# Patient Record
Sex: Male | Born: 1970 | Hispanic: No | Marital: Single | Smoking: Never smoker
Health system: Southern US, Community
[De-identification: ages and names within clinical notes are randomized; demographics above are authoritative.]

## PROBLEM LIST (undated history)

## (undated) DIAGNOSIS — D62 Acute posthemorrhagic anemia: Secondary | ICD-10-CM

## (undated) DIAGNOSIS — K760 Fatty (change of) liver, not elsewhere classified: Secondary | ICD-10-CM

## (undated) DIAGNOSIS — H40053 Ocular hypertension, bilateral: Secondary | ICD-10-CM

## (undated) DIAGNOSIS — K269 Duodenal ulcer, unspecified as acute or chronic, without hemorrhage or perforation: Secondary | ICD-10-CM

## (undated) DIAGNOSIS — E119 Type 2 diabetes mellitus without complications: Secondary | ICD-10-CM

## (undated) DIAGNOSIS — R569 Unspecified convulsions: Secondary | ICD-10-CM

## (undated) DIAGNOSIS — L409 Psoriasis, unspecified: Secondary | ICD-10-CM

## (undated) DIAGNOSIS — K279 Peptic ulcer, site unspecified, unspecified as acute or chronic, without hemorrhage or perforation: Secondary | ICD-10-CM

## (undated) DIAGNOSIS — H25013 Cortical age-related cataract, bilateral: Secondary | ICD-10-CM

## (undated) DIAGNOSIS — K7460 Unspecified cirrhosis of liver: Secondary | ICD-10-CM

## (undated) HISTORY — DX: Fatty (change of) liver, not elsewhere classified: K76.0

## (undated) HISTORY — DX: Cortical age-related cataract, bilateral: H25.013

## (undated) HISTORY — DX: Duodenal ulcer, unspecified as acute or chronic, without hemorrhage or perforation: K26.9

## (undated) HISTORY — DX: Ocular hypertension, bilateral: H40.053

## (undated) HISTORY — DX: Type 2 diabetes mellitus without complications: E11.9

## (undated) HISTORY — DX: Peptic ulcer, site unspecified, unspecified as acute or chronic, without hemorrhage or perforation: K27.9

---
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
## 1998-10-28 ENCOUNTER — Emergency Department (HOSPITAL_COMMUNITY): Admission: EM | Admit: 1998-10-28 | Discharge: 1998-10-28 | Payer: Self-pay | Admitting: Emergency Medicine

## 1998-10-28 ENCOUNTER — Encounter: Payer: Self-pay | Admitting: Emergency Medicine

## 2000-01-24 ENCOUNTER — Emergency Department (HOSPITAL_COMMUNITY): Admission: EM | Admit: 2000-01-24 | Discharge: 2000-01-24 | Payer: Self-pay | Admitting: Emergency Medicine

## 2005-05-05 ENCOUNTER — Observation Stay (HOSPITAL_COMMUNITY): Admission: EM | Admit: 2005-05-05 | Discharge: 2005-05-06 | Payer: Self-pay | Admitting: *Deleted

## 2005-05-10 ENCOUNTER — Ambulatory Visit (HOSPITAL_COMMUNITY): Admission: RE | Admit: 2005-05-10 | Discharge: 2005-05-10 | Payer: Self-pay | Admitting: Pediatrics

## 2005-05-15 ENCOUNTER — Ambulatory Visit: Payer: Self-pay | Admitting: Nurse Practitioner

## 2005-05-17 ENCOUNTER — Ambulatory Visit: Payer: Self-pay | Admitting: *Deleted

## 2005-08-22 ENCOUNTER — Ambulatory Visit: Payer: Self-pay | Admitting: Nurse Practitioner

## 2005-09-06 ENCOUNTER — Ambulatory Visit (HOSPITAL_COMMUNITY): Admission: RE | Admit: 2005-09-06 | Discharge: 2005-09-06 | Payer: Self-pay | Admitting: Internal Medicine

## 2005-09-06 ENCOUNTER — Encounter (INDEPENDENT_AMBULATORY_CARE_PROVIDER_SITE_OTHER): Payer: Self-pay | Admitting: *Deleted

## 2005-10-26 ENCOUNTER — Emergency Department (HOSPITAL_COMMUNITY): Admission: EM | Admit: 2005-10-26 | Discharge: 2005-10-26 | Payer: Self-pay | Admitting: Emergency Medicine

## 2007-04-06 ENCOUNTER — Inpatient Hospital Stay (HOSPITAL_COMMUNITY): Admission: AD | Admit: 2007-04-06 | Discharge: 2007-04-07 | Payer: Self-pay | Admitting: Neurology

## 2007-04-06 ENCOUNTER — Encounter: Payer: Self-pay | Admitting: Emergency Medicine

## 2007-05-19 ENCOUNTER — Emergency Department (HOSPITAL_COMMUNITY): Admission: EM | Admit: 2007-05-19 | Discharge: 2007-05-19 | Payer: Self-pay | Admitting: Emergency Medicine

## 2007-05-20 ENCOUNTER — Encounter (INDEPENDENT_AMBULATORY_CARE_PROVIDER_SITE_OTHER): Payer: Self-pay | Admitting: Nurse Practitioner

## 2007-05-20 ENCOUNTER — Ambulatory Visit: Payer: Self-pay | Admitting: Internal Medicine

## 2007-05-20 LAB — CONVERTED CEMR LAB: Carbamazepine Lvl: 5.2 ug/mL (ref 4.0–12.0)

## 2007-06-13 ENCOUNTER — Ambulatory Visit: Payer: Self-pay | Admitting: Family Medicine

## 2007-07-15 ENCOUNTER — Encounter (INDEPENDENT_AMBULATORY_CARE_PROVIDER_SITE_OTHER): Payer: Self-pay | Admitting: Nurse Practitioner

## 2007-07-15 ENCOUNTER — Ambulatory Visit: Payer: Self-pay | Admitting: Internal Medicine

## 2007-07-15 LAB — CONVERTED CEMR LAB
ALT: 20 units/L (ref 0–53)
AST: 20 units/L (ref 0–37)
Alkaline Phosphatase: 85 units/L (ref 39–117)
CO2: 25 meq/L (ref 19–32)
Calcium: 9.5 mg/dL (ref 8.4–10.5)
Chloride: 103 meq/L (ref 96–112)
Creatinine, Ser: 0.81 mg/dL (ref 0.40–1.50)
Potassium: 3.9 meq/L (ref 3.5–5.3)
Sodium: 140 meq/L (ref 135–145)
Total Bilirubin: 0.4 mg/dL (ref 0.3–1.2)
Total Protein: 7.7 g/dL (ref 6.0–8.3)

## 2007-07-21 ENCOUNTER — Ambulatory Visit (HOSPITAL_COMMUNITY): Admission: RE | Admit: 2007-07-21 | Discharge: 2007-07-21 | Payer: Self-pay | Admitting: Family Medicine

## 2007-09-08 ENCOUNTER — Observation Stay (HOSPITAL_COMMUNITY): Admission: EM | Admit: 2007-09-08 | Discharge: 2007-09-09 | Payer: Self-pay | Admitting: Emergency Medicine

## 2007-10-13 ENCOUNTER — Encounter (INDEPENDENT_AMBULATORY_CARE_PROVIDER_SITE_OTHER): Payer: Self-pay | Admitting: Family Medicine

## 2007-10-13 ENCOUNTER — Ambulatory Visit: Payer: Self-pay | Admitting: Internal Medicine

## 2007-10-13 LAB — CONVERTED CEMR LAB
Amphetamine Screen, Ur: NEGATIVE
Basophils Relative: 1 % (ref 0–1)
Benzodiazepines.: NEGATIVE
Creatinine,U: 191.4 mg/dL
Eosinophils Absolute: 0.4 10*3/uL (ref 0.0–0.7)
HCT: 45.5 % (ref 39.0–52.0)
MCHC: 33.2 g/dL (ref 30.0–36.0)
MCV: 83.9 fL (ref 78.0–100.0)
Marijuana Metabolite: POSITIVE — AB
Neutro Abs: 3.2 10*3/uL (ref 1.7–7.7)
Neutrophils Relative %: 57 % (ref 43–77)
Opiate Screen, Urine: NEGATIVE
Phencyclidine (PCP): NEGATIVE
Propoxyphene: NEGATIVE
RDW: 12.9 % (ref 11.5–15.5)
WBC: 5.5 10*3/uL (ref 4.0–10.5)

## 2007-10-16 ENCOUNTER — Encounter (INDEPENDENT_AMBULATORY_CARE_PROVIDER_SITE_OTHER): Payer: Self-pay | Admitting: Family Medicine

## 2007-11-03 ENCOUNTER — Encounter (INDEPENDENT_AMBULATORY_CARE_PROVIDER_SITE_OTHER): Payer: Self-pay | Admitting: Family Medicine

## 2007-11-03 ENCOUNTER — Ambulatory Visit: Payer: Self-pay | Admitting: Internal Medicine

## 2008-02-18 ENCOUNTER — Ambulatory Visit: Payer: Self-pay | Admitting: Internal Medicine

## 2008-03-24 ENCOUNTER — Ambulatory Visit: Payer: Self-pay | Admitting: Internal Medicine

## 2008-05-17 ENCOUNTER — Ambulatory Visit: Payer: Self-pay | Admitting: Internal Medicine

## 2008-06-02 ENCOUNTER — Ambulatory Visit: Payer: Self-pay | Admitting: Cardiology

## 2008-06-02 ENCOUNTER — Ambulatory Visit: Admission: RE | Admit: 2008-06-02 | Discharge: 2008-06-02 | Payer: Self-pay | Admitting: Internal Medicine

## 2008-06-02 ENCOUNTER — Encounter: Payer: Self-pay | Admitting: Internal Medicine

## 2009-03-29 ENCOUNTER — Ambulatory Visit: Payer: Self-pay | Admitting: Internal Medicine
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
## 2010-08-22 NOTE — Procedures (Signed)
EEG NUMBER:  [id number].   CLINICAL HISTORY:  This is a 40 year old male being evaluated for  seizures.   MEDICATION LISTED:  Tegretol, normal saline, Tylenol, Ativan, and  Flomax.   There is a portable EEG recorded with the patient awake using 17-channel  machine and standard 10/20 electrode placement.   The background awake rhythm consists of 10-11 Hz alpha which is of  moderate amplitude synchronous reactive to eye-opening and closure.  No  paroxysmal epileptiform activity or spikes are seen.  Intermittent left  frontal and central sharp waves seen, but they did not acquire  epileptiform features.  Stages of drowsiness and mild sleep are achieved  in this tracing and show normal physiological findings.  Length of the  recording is 20.6 minutes.  Technical component is average.  EKG tracing  reveals regular sinus rhythm.  Hyperventilation and photic stimulation  not performed, this being a portable study.   IMPRESSION:  This EEG performed during awake states and light sleep is  slightly abnormal due to presence of focal left hemispheric cortical  irritability, but no definite epileptiform features were noted.   INDICATIONS:  Thank you           ______________________________  Kathie Rhodes. Leonie Man, MD     [id number]  D:  04/07/2007 14:10:39  T:  04/07/2007 16:20:05  Job #:  [id number]
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
## 2010-08-25 NOTE — H&P (Signed)
[name], [name]                 ACCOUNT NO.:  [id number]   MEDICAL RECORD NO.:  [id number]          PATIENT TYPE:  INP   LOCATION:  3006                         FACILITY:  [facility]   PHYSICIAN:  Princess Bruins. Hickling, M.D.DATE OF BIRTH:  1970-12-23   DATE OF ADMISSION:  05/05/2005  DATE OF DISCHARGE:                                HISTORY & PHYSICAL   CHIEF COMPLAINT:  Left focal seizures, headache, lumbar puncture, with  elevated red blood cells.   HISTORY OF PRESENT ILLNESS:  A 40 year old right-handed [place] male who  has immigrated to this country from [place].  He awakened at 6:15 in the  morning and was normal.  Around 10:15, he awakened and felt dizzy, unsteady  on his feet, possibly vertigo.  His sister observed rotatory nystagmus in  his eyes.  He came out of his room stumbling.  She had him kneel down.  He  complained of a frontal headache.  He then had onset of left focal motor  seizures.  He thinks that the seizures began when he was in his room.  She  did not see the seizures until after he had come into the kitchen where she  was.  He fell over and struck his head, although did not strike it hard from  a squatting position.  She kept her hand underneath his head, but he  continued to strike his head on the floor, although not very hard.  She only  saw jerking of the left side.  She does not remember the right side jerking.  He had saliva, he was unresponsive, the entire episode lasted for about five  minutes.  He had postictal stupor for five minutes, and confusion for a much  longer period of time.  By the time he arrived to [facility], he  complained of a headache, as it was frontal, 10/10.  He did not have a stiff  neck.  On the left, Dr. Kyra Searles who saw him, was concerned about a  subarachnoid hemorrhage and had a lumbar puncture carried out under  fluoroscopy.  In speaking with Peterson Ao, the needle went to the  perivenous plexus posteriorly and  there was initial blood which cleared with  time, although it dripped slowly.  Red blood cells 11,320 in the first tube,  2590 in the fourth.  Though I suspect that this is just a traumatic tap and  postictal migraine, we need to perform a catheter angiogram to rule out a  berry aneurysm.  CT scan of the brain was reviewed by me personally and is  normal.   PAST MEDICAL HISTORY:  The patient had simple febrile seizures up to age 35.  He has not had a closed head injury, no hospitalizations.   PAST SURGICAL HISTORY:  None.   REVIEW OF SYSTEMS:  The patient has had no intercurrent infections of the  head, neck, lungs, GI, GU, rash, anemia, bruisability, diabetes, or thyroid  disease.  He does have psoriasis.  A 12-system review was otherwise  negative.   ALLERGIES:  1.  PENICILLIN.  2.  SULFA.   MEDICATIONS:  None.   FAMILY HISTORY:  No seizures, no mental retardation, cerebral palsy,  blindness, deafness, or other neurologic disorders.   SOCIAL HISTORY:  The patient lives with his sisters and brother-in-law.  He  quit smoking three weeks ago.  He denies use of alcohol and drugs.   PHYSICAL EXAMINATION:  VITAL SIGNS:  Temperature 97, blood pressure 131/70,  resting pulse 82, respirations 16, oxygen saturation 98%.  GENERAL:  He has a dull frontal headache.  HEENT:  No signs of infection.  Tympanic membranes were shiny without  infection.  No Battle's sign.  He has allergic shiners.  NECK:  Supple.  LUNGS:  Clear.  HEART:  No murmurs.  Pulses normal.  ABDOMEN:  Soft, scaphoid, bowel sounds normal, no hepatosplenomegaly.  EXTREMITIES:  Negative.  He is quite thin.  NEUROLOGIC:  Mental status:  Awake, alert, sleepy, no dysphasia, or  dyspraxia.  Cranial nerves:  Round reactive pupils.  Visual fields full.  Extraocular movements full.  Symmetric facial strength.  Midline tongue and  uvula.  Air conduction greater than bone conduction bilaterally.  Motor  examination:  Normal  strength, tone, and mass.  Good fine motor movements.  No pronator drift.  Sensation intact to primary and cortical modalities.  Cerebellar examination:  Good finger-to-nose, rapid repetitive movements.  Gait normal.  Deep tendon reflexes diminished.  Toes bilaterally flexor.   IMPRESSION:  1.  Left focal seizures, single seizure, not definitely epilepsy, 780.39.      He has half the semiology of a complex partial seizure, right brain      signature.  2.  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  I doubt that he had a      subarachnoid hemorrhage.   PLAN:  Catheter angiogram.  If it is positive for a berry aneurysm I will  consult neurosurgery and ask them to admit.  If negative, I will admit the  patient for observation for seizures.  We are not going to treat with  antiepileptic drugs for now.  MRI of the brain with and without contrast  will be done in the morning.  EEG done next week.  I will probably discharge  him tomorrow after the MRI if he is stable.  This has been discussed with  the patient's family.  Dr. Lawrence Santiago has agreed to perform the catheter  angiogram, agrees with the rationale.   LABORATORY DATA:  I reviewed the laboratory, it is as follows:  Sodium 135,  potassium 4.8, chloride 107, BUN 15, glucose 135.  Venous pH 7.317,  bicarbonate 21.5.  Hemoglobin 17, hematocrit 50.  Creatinine 0.7.  White  blood cell count 6500, hemoglobin 15.2, hematocrit 45.2, MCV 82.2, platelet  count 213,000.  Neutrophils 58, lymphocytes 28, monocytes 5, eosinophils 90,  basophils 1.  Alcohol level not detectable.  Glucose 69, protein 51, red  blood cells 11,320, white blood cells 13, neutrophils 68%, lymphocytes 25%,  7% monomacrophages, very similar to the peripheral smear differential.  Tube  #4, 2590 red blood cells, 4 white blood cells.  Toxic screen:  Opiates  positive, cocaine negative, benzodiazepines negative, barbiturates negative, amphetamines negative, THC positive.   Prothrombin time 13.5.  I do not see a  PTT recorded.  The patient received morphine at 1300 when he came in which  accounts for his opiates.  Urine tox screen was done at 1530.  The patient  received Ativan at 1544 for his lumbar puncture.  Princess Bruins. Gaynell Face, M.D.  Electronically Signed     [facility]  D:  05/05/2005  T:  05/06/2005  Job:  [id number]

## 2010-08-25 NOTE — Discharge Summary (Signed)
[name], [name]                 ACCOUNT NO.:  [id number]   MEDICAL RECORD NO.:  [id number]          PATIENT TYPE:  INP   LOCATION:  3022                         FACILITY:  [facility]   PHYSICIAN:  Flint Melter. Jacolyn Reedy, M.D.DATE OF BIRTH:  22-Jun-1970   DATE OF ADMISSION:  04/06/2007  DATE OF DISCHARGE:  04/07/2007                               DISCHARGE SUMMARY   ADMITTING DIAGNOSIS:  Seizure.   DISCHARGE DIAGNOSIS:  Seizure.   MEDICATIONS ON DISCHARGE:  1. Tegretol 200 mg t.i.d.  2. Allegra 180 mg daily.   FOLLOWUP:  Follow up with Dr. Gaynell Face, [facility].  Call for  appointment in six to eight weeks.  Will need Tegretol level in two  weeks.  No restrictions on diet.  Wound care is not applicable.   ACTIVITY:  No driving for three months until followup with Dr. Gaynell Face.   STUDIES:  MRI of the brain done on April 07, 2007, was negative for  any acute intracranial findings.  The parenchyma was normal.  No  abnormal enhancement was seen.   HOSPITAL COURSE:  Please refer to the history and physical for details.  This is a 40 year old [place] male with no significant past medical  history until January 2007 when he was admitted with a single seizure  with right brain signature, left focal motor with complex semiology.  He  had a traumatic LP, but a negative angiogram.  EEG showed occasional  left frontotemporal sharp transients.  MRI of the brain at that time was  normal.  He was discharged without any clear diagnosis or etiology.  He  had a second seizure in July 2007, but apparently did not follow up with  the neurologist.  On the day of this admission on April 06, 2007, he  was found on the floor by his mother and sister after several events  which were not otherwise described.  The family called EMS, and the  seizures were witnessed in the emergency room, but he was described as  alternately combative and sleepy postictally.  He was eventually given 4  mg of  Ativan and 20 mg of Geodon.  As he did not clear within a few  hours, he was transferred from [place] to [facility] stepdown for  further observation and workup.  An EEG was performed and showed a few  left central parietal sharps but no definite seizures.  The morning of  discharge, he was awake and alert, fully oriented and conversant, moving  all extremities, and had a nonfocal exam.  It was felt that he had  complex partial seizures.  He was started on Tegretol and discharged in  improved condition as outlined above, to follow up with Dr. Gaynell Face.      Catherine A. Jacolyn Reedy, M.D.  Electronically Signed     CAW/MEDQ  D:  06/25/2007  T:  06/25/2007  Job:  [id number]
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
## 2010-08-25 NOTE — Discharge Summary (Signed)
[name], [name]                 ACCOUNT NO.:  [id number]   MEDICAL RECORD NO.:  [id number]          PATIENT TYPE:  OBV   LOCATION:  3308                         FACILITY:  [facility]   PHYSICIAN:  Princess Bruins. 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   PROCEDURES:  1. CT scan of the brain.  2. EEG.   COMPLICATIONS:  None.   SUMMARY OF THE HOSPITALIZATION:  The patient was admitted in a postictal  state.  He pulled out several IVs placed and refused acute treatment.  He was placed under observation in the step-down unit because of his  unstable condition.  He fortunately had no further seizures, though he  had had a series of at least three seizures in a 24-hour period treated  with Ativan.  His carbamazepine level was 2.9 mcg/mL.  His dose of  medicine he took at home was Tegretol but has been husbanding his  medication and had actually missed a dose or two because he was unable  to obtain medication from [facility] without an appointment.   The patient improved to normal level of alertness with no further  seizures.  His carbamazepine dose was increased to 200 mg in the morning  and afternoon, and 400 mg at nighttime.  He was discharged home with  recommendations to return for follow-up at [facility] and also to be seen at [facility] for ongoing  follow-up of his medical care.  I wrote a prescription for 3 days with  medication to give him time to contact physicians at [facility].      Princess Bruins. Gaynell Face, M.D.  Electronically Signed     [facility]  D:  09/19/2007  T:  09/19/2007  Job:  [id number]   cc:   Monia Sabal. Jobe Igo, M.D.

## 2010-08-25 NOTE — Procedures (Signed)
EEG NUMBER:  [id number]   ORDERED BY:  Dr. Gaynell Face   This is a routine 17-channel EEG utilizing the [employer] 10-20 lead  placement system. The patient is described as being awake and alert through  the recording. This is a 40 year old man with a history of possible seizure  and a history of childhood seizures.   The patient does appear to be in the waking state throughout the course of  the recording. The background consists of a well-organized, well-modulated  10 Hz alpha activity which is predominant in the posterior head regions. No  clear interhemispheric asymmetry is identified with the exception of  occasional transient sharp waves in the left frontotemporal region without  clear-cut generalization or evolution into a seizure discharge. Activation  procedures were performed including photic stimulation and hyperventilation.  These did not produce any significant change in the background activity. The  EKG reveals a relatively regular rhythm with a low rate in the 50s and 60s.   CONCLUSION:  Essentially normal EEG with the exception of occasional sharp  transients in the left frontotemporal region. Although no definite seizure  discharges are seen this could indicate some focal irritability in this  region which might serve to lower the seizure threshold. Clinical  correlation is recommended. Sleep-deprived EEG may be helpful if indicated.      Catherine A. Jacolyn Reedy, M.D.  Electronically Signed     [id number]  D:  05/10/2005 14:23:43  T:  05/10/2005 16:24:42  Job #:  [id number]

## 2011-01-04 LAB — COMPREHENSIVE METABOLIC PANEL
AST: 26
BUN: 7
CO2: 30
Glucose, Bld: 166 — ABNORMAL HIGH
Potassium: 3.8
Sodium: 139
Total Protein: 6.6

## 2011-01-04 LAB — CBC
HCT: 44.2
Hemoglobin: 14.7
MCHC: 33.2
RBC: 5.17
RDW: 12.3
WBC: 7

## 2011-01-04 LAB — URINALYSIS, ROUTINE W REFLEX MICROSCOPIC
Bilirubin Urine: NEGATIVE
Glucose, UA: 250 — AB
Ketones, ur: 15 — AB
Protein, ur: 100 — AB
Specific Gravity, Urine: 1.017
Urobilinogen, UA: 0.2
pH: 7.5

## 2011-01-04 LAB — DIFFERENTIAL
Eosinophils Absolute: 0.6
Eosinophils Relative: 9 — ABNORMAL HIGH
Lymphocytes Relative: 25
Monocytes Absolute: 0.3
Monocytes Relative: 4
Neutro Abs: 4.3

## 2011-01-04 LAB — CARBAMAZEPINE LEVEL, TOTAL: Carbamazepine Lvl: 2.7 — ABNORMAL LOW

## 2011-01-12 LAB — CBC
HCT: 42.7
Hemoglobin: 14.3
MCV: 82.7
Platelets: 242
RBC: 5.17
WBC: 8.6

## 2011-01-12 LAB — COMPREHENSIVE METABOLIC PANEL
AST: 37
Albumin: 3.8
Calcium: 9.2
Chloride: 106
Creatinine, Ser: 0.93
Glucose, Bld: 169 — ABNORMAL HIGH
Total Bilirubin: 0.6
Total Protein: 6.9

## 2011-01-12 LAB — ETHANOL: Alcohol, Ethyl (B): <5

## 2011-01-12 LAB — DIFFERENTIAL
Basophils Absolute: 0
Basophils Relative: 0
Lymphocytes Relative: 27
Monocytes Absolute: 0.6
Monocytes Relative: 7

## 2011-01-12 LAB — RAPID URINE DRUG SCREEN, HOSP PERFORMED
Benzodiazepines: NOT DETECTED
Cocaine: NOT DETECTED
Opiates: NOT DETECTED

## 2011-01-12 LAB — URINALYSIS, ROUTINE W REFLEX MICROSCOPIC
Bilirubin Urine: NEGATIVE
Ketones, ur: NEGATIVE
Leukocytes, UA: NEGATIVE
Nitrite: NEGATIVE
Protein, ur: 30 — AB
pH: 6.5

## 2015-12-11 ENCOUNTER — Emergency Department (HOSPITAL_COMMUNITY)
Admission: EM | Admit: 2015-12-11 | Discharge: 2015-12-11 | Disposition: A | Discharge status: Home / Self Care | Payer: Self-pay | Attending: Emergency Medicine | Admitting: Emergency Medicine

## 2015-12-11 ENCOUNTER — Encounter (HOSPITAL_COMMUNITY): Payer: Self-pay | Admitting: Emergency Medicine

## 2015-12-11 DIAGNOSIS — K089 Disorder of teeth and supporting structures, unspecified: Secondary | ICD-10-CM | POA: Insufficient documentation

## 2015-12-11 DIAGNOSIS — R112 Nausea with vomiting, unspecified: Secondary | ICD-10-CM | POA: Insufficient documentation

## 2015-12-11 DIAGNOSIS — L989 Disorder of the skin and subcutaneous tissue, unspecified: Secondary | ICD-10-CM | POA: Insufficient documentation

## 2015-12-11 DIAGNOSIS — Z79899 Other long term (current) drug therapy: Secondary | ICD-10-CM | POA: Insufficient documentation

## 2015-12-11 DIAGNOSIS — K0889 Other specified disorders of teeth and supporting structures: Secondary | ICD-10-CM | POA: Insufficient documentation

## 2015-12-11 HISTORY — DX: Unspecified cirrhosis of liver: K74.60

## 2015-12-11 HISTORY — DX: Unspecified convulsions: R56.9

## 2015-12-11 LAB — CBC WITH DIFFERENTIAL/PLATELET
Basophils Absolute: 0 10*3/uL (ref 0.0–0.1)
Basophils Relative: 0 %
Eosinophils Absolute: 0.3 10*3/uL (ref 0.0–0.7)
Eosinophils Relative: 3 %
HCT: 46.1 % (ref 39.0–52.0)
Hemoglobin: 15.5 g/dL (ref 13.0–17.0)
Lymphocytes Relative: 25 %
Lymphs Abs: 2.3 10*3/uL (ref 0.7–4.0)
MCH: 26.5 pg (ref 26.0–34.0)
MCHC: 33.6 g/dL (ref 30.0–36.0)
MCV: 78.9 fL (ref 78.0–100.0)
Monocytes Absolute: 0.4 10*3/uL (ref 0.1–1.0)
Monocytes Relative: 5 %
Neutro Abs: 6.3 10*3/uL (ref 1.7–7.7)
Neutrophils Relative %: 67 %
Platelets: 248 10*3/uL (ref 150–400)
RBC: 5.84 MIL/uL — ABNORMAL HIGH (ref 4.22–5.81)
RDW: 13.4 % (ref 11.5–15.5)
WBC: 9.4 10*3/uL (ref 4.0–10.5)

## 2015-12-11 LAB — COMPREHENSIVE METABOLIC PANEL
ALT: 16 U/L — ABNORMAL LOW (ref 17–63)
AST: 24 U/L (ref 15–41)
Albumin: 4.7 g/dL (ref 3.5–5.0)
Alkaline Phosphatase: 64 U/L (ref 38–126)
Anion gap: 13 (ref 5–15)
BUN: <5 mg/dL — ABNORMAL LOW (ref 6–20)
CO2: 21 mmol/L — ABNORMAL LOW (ref 22–32)
Calcium: 9.5 mg/dL (ref 8.9–10.3)
Chloride: 101 mmol/L (ref 101–111)
Creatinine, Ser: 0.89 mg/dL (ref 0.61–1.24)
GFR calc Af Amer: >60 mL/min (ref >60)
GFR calc non Af Amer: >60 mL/min (ref >60)
Glucose, Bld: 109 mg/dL — ABNORMAL HIGH (ref 65–99)
Potassium: 3.5 mmol/L (ref 3.5–5.1)
Sodium: 135 mmol/L (ref 135–145)
Total Bilirubin: 0.3 mg/dL (ref 0.3–1.2)
Total Protein: 8.3 g/dL — ABNORMAL HIGH (ref 6.5–8.1)

## 2015-12-11 LAB — LIPASE, BLOOD: Lipase: 22 U/L (ref 11–51)

## 2015-12-11 MED ORDER — CLINDAMYCIN HCL 150 MG PO CAPS: 450.0000 mg | ORAL_CAPSULE | Freq: Three times a day (TID) | ORAL | 0 refills | Status: AC

## 2015-12-11 MED ORDER — SODIUM CHLORIDE 0.9 % IV BOLUS (SEPSIS)
1000.0000 mL | Freq: Once | INTRAVENOUS | Status: AC
  Administered 2015-12-11: 1000 mL via INTRAVENOUS

## 2015-12-11 MED ORDER — ONDANSETRON HCL 4 MG/2ML IJ SOLN
4.0000 mg | Freq: Once | INTRAMUSCULAR | Status: AC
  Administered 2015-12-11: 4 mg via INTRAVENOUS
  Filled 2015-12-11: qty 2

## 2015-12-11 MED ORDER — NAPROXEN 375 MG PO TABS: 375.0000 mg | ORAL_TABLET | Freq: Two times a day (BID) | ORAL | 0 refills | Status: DC

## 2015-12-11 MED ORDER — ONDANSETRON HCL 4 MG PO TABS: 4.0000 mg | ORAL_TABLET | Freq: Three times a day (TID) | ORAL | 0 refills | Status: DC | PRN

## 2015-12-11 NOTE — ED Provider Notes (Signed)
[facility] DEPT Provider Note   CSN: [id number] Arrival date & time: 12/11/15  [id number]     History   Chief Complaint Chief Complaint  Patient presents with  . Dizziness    HPI [name] is a 45 y.o. male with history of epilepsy and psoriasis who presents with a four-day history of nausea and vomiting. Patient also reports intermittent nausea and feeling like he is going to "hit the floor" when he smells certain smells for the past year and a half. He also reports associated generalized body aches. Patient states he has had a decreased appetite "for a long time"and only eats every 3-4 days. Patient states that ever since his diagnosis of epilepsy, "my brain doesn't work right." Patient states that he sees black spots with headlights on the throat and gets a pounding headache. Patient also reports multiple skin lesions following getting any type of nick or bite. He has tried psoriasis medication. Patient also reports dental pain that has been chronic. Patient reports that he needs to have his teeth pulled. Patient has also taken ibuprofen for his symptoms. Patient has had 3-4 seizures in his life, last seizure being 3 years ago. Patient denies vertigo, chest pain, shortness of breath, abdominal pain, current nausea, urinary symptoms.  HPI  Past Medical History:  Diagnosis Date  . Cirrhosis ([place])   . Seizures ([place])     There are no active problems to display for this patient.   History reviewed. No pertinent surgical history.     Home Medications    Prior to Admission medications   Medication Sig Start Date End Date Taking? Authorizing Provider  fexofenadine (ALLEGRA) 180 MG tablet Take 180 mg by mouth daily as needed for allergies or rhinitis.   Yes Historical Provider, MD  ibuprofen (ADVIL,MOTRIN) 200 MG tablet Take 600-800 mg by mouth every 6 (six) hours as needed for moderate pain.   Yes Historical Provider, MD  clindamycin (CLEOCIN) 150 MG capsule Take 3 capsules (450 mg  total) by mouth 3 (three) times daily. 12/11/15 12/18/15  Frederica Kuster, PA-C  naproxen (NAPROSYN) 375 MG tablet Take 1 tablet (375 mg total) by mouth 2 (two) times daily with a meal. 12/11/15   Bea Graff Kyran Connaughton, PA-C  ondansetron (ZOFRAN) 4 MG tablet Take 1 tablet (4 mg total) by mouth every 8 (eight) hours as needed for nausea or vomiting. 12/11/15   Frederica Kuster, PA-C    Family History History reviewed. No pertinent family history.  Social History Social History  Substance Use Topics  . Smoking status: Never Smoker  . Smokeless tobacco: Never Used  . Alcohol use No     Allergies   Penicillins   Review of Systems Review of Systems  Constitutional: Negative for appetite change (decreased for 1.5 years ), chills and fever.  HENT: Negative for facial swelling and sore throat.   Respiratory: Negative for shortness of breath.   Cardiovascular: Negative for chest pain.  Gastrointestinal: Positive for nausea and vomiting. Negative for abdominal pain and diarrhea.  Genitourinary: Negative for dysuria.  Musculoskeletal: Negative for back pain.  Skin: Positive for rash. Negative for wound.  Neurological: Negative for headaches.  Psychiatric/Behavioral: The patient is not nervous/anxious.      Physical Exam Updated Vital Signs BP 125/89   Pulse 80   Temp 97.5 F (36.4 C) (Oral)   Resp 16   Ht 5\' 9"  (1.753 m)   Wt 63.5 kg   SpO2 100%   BMI 20.67 kg/m  Physical Exam  Constitutional: He appears well-developed and well-nourished. No distress.  HENT:  Head: Normocephalic and atraumatic.  Mouth/Throat: Oropharynx is clear and moist. Abnormal dentition. No oropharyngeal exudate.    Very poor dentition, black throughout mouth  Eyes: Conjunctivae and EOM are normal. Pupils are equal, round, and reactive to light. Right eye exhibits no discharge. Left eye exhibits no discharge. No scleral icterus.  Neck: Normal range of motion. Neck supple. No thyromegaly present.    Cardiovascular: Normal rate, regular rhythm, normal heart sounds and intact distal pulses.  Exam reveals no gallop and no friction rub.   No murmur heard. Pulmonary/Chest: Effort normal and breath sounds normal. No stridor. No respiratory distress. He has no wheezes. He has no rales.  Abdominal: Soft. Bowel sounds are normal. He exhibits no distension. There is no tenderness. There is no rebound and no guarding.  Musculoskeletal: He exhibits no edema.  Lymphadenopathy:    He has no cervical adenopathy.  Neurological: He is alert. Coordination normal.  CN 3-12 intact; normal sensation throughout; 5/5 strength in all 4 extremities; equal bilateral grip strength; no ataxia on finger to nose   Skin: Skin is warm and dry. No rash noted. He is not diaphoretic. No pallor.  Multiple lesions on arms as shown in image; no erythema or drainage  Psychiatric: He has a normal mood and affect.  Nursing note and vitals reviewed.      ED Treatments / Results  Labs (all labs ordered are listed, but only abnormal results are displayed) Labs Reviewed  COMPREHENSIVE METABOLIC PANEL - Abnormal; Notable for the following:       Result Value   CO2 21 (*)    Glucose, Bld 109 (*)    BUN <5 (*)    Total Protein 8.3 (*)    ALT 16 (*)    All other components within normal limits  CBC WITH DIFFERENTIAL/PLATELET - Abnormal; Notable for the following:    RBC 5.84 (*)    All other components within normal limits  LIPASE, BLOOD    EKG  EKG Interpretation None       Radiology No results found.  Procedures Procedures (including critical care time)  Medications Ordered in ED Medications  ondansetron (ZOFRAN) injection 4 mg (4 mg Intravenous Given 12/11/15 0639)  sodium chloride 0.9 % bolus 1,000 mL (1,000 mLs Intravenous New Bag/Given 12/11/15 [id number])     Initial Impression / Assessment and Plan / ED Course  I have reviewed the triage vital signs and the nursing notes.  Pertinent labs & imaging  results that were available during my care of the patient were reviewed by me and considered in my medical decision making (see chart for details).  Clinical Course    0630 Patient vomited "all over the floor" according to nurse. Zofran, 1L bolus ordered. 0740 On reevaluation, patient feeling much better. PO challenge with anticipated discharge.  Patient with many chronic complaints. CBC unremarkable. CMP shows CO2 21, glucose 109, BUN <5, protein 8.3, ALT 16. Lipase 22. Patient given Zofran and 1 L bolus in the ED. Patient feeling well and tolerating fluids and crackers prior to discharge. Discharge patient home with clindamycin for dental infection, Zofran, and Naprosyn for generalized muscle aching. Skin rash shows no signs of infection; healing lesions. Patient to follow-up and establish care with community health and wellness and dental clinic. Patient given these resources and encouraged to follow up as soon as possible for further evaluation of his symptoms. Return precautions discussed. Patient  understands and agrees with plan. Patient vitals stable throughout ED course and discharge in satisfactory condition. I discussed patient case with Dr. Vanita Panda who guided the patient's management and agrees with plan.  Final Clinical Impressions(s) / ED Diagnoses   Final diagnoses:  Non-intractable vomiting with nausea, vomiting of unspecified type  Pain, dental    New Prescriptions New Prescriptions   CLINDAMYCIN (CLEOCIN) 150 MG CAPSULE    Take 3 capsules (450 mg total) by mouth 3 (three) times daily.   NAPROXEN (NAPROSYN) 375 MG TABLET    Take 1 tablet (375 mg total) by mouth 2 (two) times daily with a meal.   ONDANSETRON (ZOFRAN) 4 MG TABLET    Take 1 tablet (4 mg total) by mouth every 8 (eight) hours as needed for nausea or vomiting.     Frederica Kuster, PA-C 12/11/15 [place], PA-C 12/11/15 [place], MD 12/15/15 1134

## 2015-12-11 NOTE — Discharge Instructions (Signed)
Medications: Clindamycin, Zofran, Naprosyn  Treatment: Take clindamycin as prescribed 3 times daily for 7 days. You can take Zofran every 8 hours as needed for nausea and vomiting. Take Naprosyn twice daily for your muscle soreness. Do not take this medication with ibuprofen or any other NSAID medication. You can take Tylenol in between Naprosyn as needed, as prescribed over-the-counter.  Follow-up: Please follow-up at the [facility] as soon as to establish care with a primary care provider for further evaluation and treatment of your symptoms. Please follow-up with a dentist as soon as possible for further evaluation of your dental pain. Please return to emergency department if you develop any new or worsening symptoms.

## 2015-12-11 NOTE — ED Triage Notes (Signed)
Pt states he is been feeling dizzy since last Wednesday, with some nausea and vomiting, some dental pain, skin break out on rash, multiple complains.

## 2015-12-12 ENCOUNTER — Encounter (HOSPITAL_COMMUNITY): Payer: Self-pay | Admitting: Emergency Medicine

## 2015-12-12 ENCOUNTER — Emergency Department (HOSPITAL_COMMUNITY)
Admission: EM | Admit: 2015-12-12 | Discharge: 2015-12-12 | Disposition: A | Discharge status: Home / Self Care | Payer: Self-pay | Attending: Emergency Medicine | Admitting: Emergency Medicine

## 2015-12-12 ENCOUNTER — Encounter (HOSPITAL_COMMUNITY): Payer: Self-pay | Admitting: *Deleted

## 2015-12-12 DIAGNOSIS — K029 Dental caries, unspecified: Secondary | ICD-10-CM | POA: Insufficient documentation

## 2015-12-12 DIAGNOSIS — M791 Myalgia, unspecified site: Secondary | ICD-10-CM

## 2015-12-12 MED ORDER — ONDANSETRON 4 MG PO TBDP
8.0000 mg | ORAL_TABLET | Freq: Once | ORAL | Status: AC
  Administered 2015-12-12: 8 mg via ORAL
  Filled 2015-12-12: qty 2

## 2015-12-12 MED ORDER — KETOROLAC TROMETHAMINE 30 MG/ML IJ SOLN
60.0000 mg | Freq: Once | INTRAMUSCULAR | Status: AC
  Administered 2015-12-12: 60 mg via INTRAMUSCULAR
  Filled 2015-12-12: qty 2

## 2015-12-12 MED ORDER — ONDANSETRON 4 MG PO TBDP
4.0000 mg | ORAL_TABLET | Freq: Once | ORAL | Status: AC
Start: 2015-12-12 — End: 2015-12-12
  Administered 2015-12-12: 4 mg via ORAL
  Filled 2015-12-12: qty 1

## 2015-12-12 NOTE — ED Provider Notes (Signed)
TIME SEEN: 2:55 AM  CHIEF COMPLAINT: Body aches  HPI: Pt is a 45 y.o. male with history of seizures, psoriasis (patient denies having cirrhosis despite medical chart) who presents emergency department complaints of several days of body aches. Was seen here in the emergency department yesterday for multiple symptoms and had a large workup which was unremarkable. Patient states he has tried Tylenol and ibuprofen, last dose was over 12 hours ago with minimal relief. He is requesting something stronger for pain. States that he needs to get some sleep. Denies to me any headache, neck pain or neck stiffness, fevers or chills, chest pain or shortness of breath, nausea, vomiting, diarrhea. Has a chronic pruritic rash which she states is his psoriasis that is unchanged.  ROS: See HPI Constitutional: no fever  Eyes: no drainage  ENT: no runny nose   Cardiovascular:  no chest pain  Resp: no SOB  GI: no vomiting GU: no dysuria Integumentary: no rash  Allergy: no hives  Musculoskeletal: no leg swelling  Neurological: no slurred speech ROS otherwise negative  PAST MEDICAL HISTORY/PAST SURGICAL HISTORY:  Past Medical History:  Diagnosis Date  . Cirrhosis ([place])   . Seizures ([place])     MEDICATIONS:  Prior to Admission medications   Medication Sig Start Date End Date Taking? Authorizing Provider  clindamycin (CLEOCIN) 150 MG capsule Take 3 capsules (450 mg total) by mouth 3 (three) times daily. 12/11/15 12/18/15  Alexandra M Law, PA-C  fexofenadine (ALLEGRA) 180 MG tablet Take 180 mg by mouth daily as needed for allergies or rhinitis.    Historical Provider, MD  ibuprofen (ADVIL,MOTRIN) 200 MG tablet Take 600-800 mg by mouth every 6 (six) hours as needed for moderate pain.    Historical Provider, MD  naproxen (NAPROSYN) 375 MG tablet Take 1 tablet (375 mg total) by mouth 2 (two) times daily with a meal. 12/11/15   Bea Graff Law, PA-C  ondansetron (ZOFRAN) 4 MG tablet Take 1 tablet (4 mg total) by mouth  every 8 (eight) hours as needed for nausea or vomiting. 12/11/15   Frederica Kuster, PA-C    ALLERGIES:  Allergies  Allergen Reactions  . Penicillins Other (See Comments)    Childhood allergy     SOCIAL HISTORY:  Social History  Substance Use Topics  . Smoking status: Never Smoker  . Smokeless tobacco: Never Used  . Alcohol use No    FAMILY HISTORY: No family history on file.  EXAM: BP 146/88   Pulse 103   Temp 97.4 F (36.3 C) (Oral)   Resp 20   Ht 5\' 9"  (1.753 m)   Wt 149 lb 3 oz (67.7 kg)   SpO2 100%   BMI 22.03 kg/m  CONSTITUTIONAL: Alert and oriented and responds appropriately to questions. Chronically ill-appearing, in no distress, afebrile and nontoxic, well-hydrated HEAD: Normocephalic EYES: Conjunctivae clear, PERRL, no scleral icterus ENT: normal nose; no rhinorrhea; moist mucous membranes, poor dentition NECK: Supple, no meningismus, no LAD  CARD: RRR; S1 and S2 appreciated; no murmurs, no clicks, no rubs, no gallops RESP: Normal chest excursion without splinting or tachypnea; breath sounds clear and equal bilaterally; no wheezes, no rhonchi, no rales, no hypoxia or respiratory distress, speaking full sentences ABD/GI: Normal bowel sounds; non-distended; soft, non-tender, no rebound, no guarding, no peritoneal signs BACK:  The back appears normal and is non-tender to palpation, there is no CVA tenderness EXT: Normal ROM in all joints; non-tender to palpation; no edema; normal capillary refill; no cyanosis, no calf  tenderness or swelling    SKIN: Normal color for age and race; warm; no rash NEURO: Moves all extremities equally, sensation to light touch intact diffusely, cranial nerves II through XII intact, normal gait PSYCH: The patient's mood and manner are appropriate. Grooming and personal hygiene are appropriate.  MEDICAL DECISION MAKING: Patient here complains of body aches. Discussed with them that this could be a viral illness. Nothing to suggest a. He  still has normal-appearing urine and is making urine normally. Had negative workup yesterday including normal creatinine, LFTs, blood counts, electrolytes. I do not feel this needs to be repeated today and he agrees. No other infectious symptoms. No other complaints of pain. Neurologically intact. Discussed with him that I do not feel it is appropriate to discharge him with narcotics for his body aches have recommended he continue Tylenol and ibuprofen. We'll give him IM Toradol in the emergency department. I feel he is safe to be discharged. He is comfortable with this plan. Given outpatient follow-up.  At this time, I do not feel there is any life-threatening condition present. I have reviewed and discussed all results (EKG, imaging, lab, urine as appropriate), exam findings with patient/family. I have reviewed nursing notes and appropriate previous records.  I feel the patient is safe to be discharged home without further emergent workup and can continue workup as an outpatient as needed. Discussed usual and customary return precautions. Patient/family verbalize understanding and are comfortable with this plan.  Outpatient follow-up has been provided. All questions have been answered.     [place], DO 12/12/15 [id number]

## 2015-12-12 NOTE — ED Triage Notes (Signed)
Unable to get a temp pt drinking cold soda temp not registering

## 2015-12-12 NOTE — ED Triage Notes (Signed)
Was seen here for same issues   Dental pain and feeling bad, states wants pain meds till he can get to dentist and has been having diarrhea

## 2015-12-12 NOTE — ED Notes (Signed)
Patient  Found vomiting in the sink.  Reports eating [employer] tonight because it was the only thing he could keep down.  Educated on appropriate diet while having n/v.  Verbalizes understanding.

## 2015-12-12 NOTE — ED Provider Notes (Signed)
[name] Provider Note   CSN: [id number] Arrival date & time: 12/12/15  0935     History   Chief Complaint Chief Complaint  Patient presents with  . Dental Pain  . Weakness    HPI [name] is a 45 y.o. male.  The history is provided by the patient. No language interpreter was used.  Dental Pain    Weakness     [name] is a 45 y.o. male who presents to the Emergency Department complaining of dental pain.  He reports 1 week of right lower jaw pain related to too bad teeth. He is scheduled to see a dentist on Friday. He has associated vomiting and diarrhea due to pain. He denies any fevers, chest pain, abdominal pain. He states he needs pain medication to get him through until Friday. He's been seen in the emergency department twice in the last 2 days for similar symptoms. He states they threw away his pain prescription.  Past Medical History:  Diagnosis Date  . Cirrhosis ([place])   . Seizures ([place])     There are no active problems to display for this patient.   History reviewed. No pertinent surgical history.     Home Medications    Prior to Admission medications   Medication Sig Start Date End Date Taking? Authorizing Provider  clindamycin (CLEOCIN) 150 MG capsule Take 3 capsules (450 mg total) by mouth 3 (three) times daily. 12/11/15 12/18/15  Alexandra M Law, PA-C  fexofenadine (ALLEGRA) 180 MG tablet Take 180 mg by mouth daily as needed for allergies or rhinitis.    Historical Provider, MD  ibuprofen (ADVIL,MOTRIN) 200 MG tablet Take 600-800 mg by mouth every 6 (six) hours as needed for moderate pain.    Historical Provider, MD  naproxen (NAPROSYN) 375 MG tablet Take 1 tablet (375 mg total) by mouth 2 (two) times daily with a meal. 12/11/15   Bea Graff Law, PA-C  ondansetron (ZOFRAN) 4 MG tablet Take 1 tablet (4 mg total) by mouth every 8 (eight) hours as needed for nausea or vomiting. 12/11/15   Frederica Kuster, PA-C    Family History No family history  on file.  Social History Social History  Substance Use Topics  . Smoking status: Never Smoker  . Smokeless tobacco: Never Used  . Alcohol use No     Allergies   Penicillins   Review of Systems Review of Systems  Neurological: Positive for weakness.  All other systems reviewed and are negative.    Physical Exam Updated Vital Signs BP 140/68 (BP Location: Right Arm)   Pulse 92   Temp 97.7 F (36.5 C)   Resp 16   SpO2 100%   Physical Exam  Constitutional: He is oriented to person, place, and time. He appears well-developed and well-nourished.  HENT:  Head: Normocephalic and atraumatic.  Mouth/Throat: Oropharynx is clear and moist.    TMs clear bilaterally. Poor dentition with multiple teeth fractured at the base. Diffuse caries. No significant gingival induration or tenderness.  Neck: Neck supple.  Cardiovascular: Normal rate and regular rhythm.   No murmur heard. Pulmonary/Chest: Effort normal and breath sounds normal. No respiratory distress.  Abdominal: Soft. There is no tenderness. There is no rebound and no guarding.  Musculoskeletal: He exhibits no edema or tenderness.  Neurological: He is alert and oriented to person, place, and time.  Skin: Skin is warm and dry.  Psychiatric: He has a normal mood and affect. His behavior is normal.  Nursing note  and vitals reviewed.    ED Treatments / Results  Labs (all labs ordered are listed, but only abnormal results are displayed) Labs Reviewed - No data to display  EKG  EKG Interpretation None       Radiology No results found.  Procedures .Nerve Block Date/Time: 12/12/2015 10:22 AM Performed by: Quintella Reichert Authorized by: Quintella Reichert   Consent:    Consent obtained:  Verbal   Consent given by:  Patient   Risks discussed:  Infection, pain and unsuccessful block   Alternatives discussed:  No treatment Indications:    Indications:  Pain relief Location:    Body area:  Head   Head nerve  blocked: inferior alveolar.   Laterality:  Right Skin anesthesia (see MAR for exact dosages):    Anesthesia method: benzocaine. Procedure details (see MAR for exact dosages):    Block needle gauge:  27 G   Block anesthetic: marcaine 0.5%   Injection procedure:  Anatomic landmarks identified and introduced needle Post-procedure details:    Dressing:  None   Outcome:  Pain relieved   Patient tolerance of procedure:  Tolerated well, no immediate complications   (including critical care time)  Medications Ordered in ED Medications  ondansetron (ZOFRAN-ODT) disintegrating tablet 8 mg (not administered)     Initial Impression / Assessment and Plan / ED Course  I have reviewed the triage vital signs and the nursing notes.  Pertinent labs & imaging results that were available during my care of the patient were reviewed by me and considered in my medical decision making (see chart for details).  Clinical Course   Patient here for dental pain, vomiting. He is nontoxic and well-hydrated on examination. He is requesting pain medications for his dental pain with follow-up on Friday. He has no evidence of infection at this time but does have rampant dental caries. Offered him a dental block and he was agreeable. Block performed per procedure note and he feels significantly improved after procedure. Discussed importance of following up with dentistry, recommend moving up appointments that he is seen sooner. Discussed continuing his clindamycin, oral fluid hydration, home care, return.  Final Clinical Impressions(s) / ED Diagnoses   Final diagnoses:  Pain due to dental caries    New Prescriptions New Prescriptions   No medications on file     Quintella Reichert, MD 12/12/15 1029

## 2015-12-12 NOTE — ED Triage Notes (Signed)
The pt is here c/o body aches n v for 2-3 days he was seen here in the past 24 hours with the same symptoms  He is constantly drinking soda althoough I told him not to drink anything else.

## 2015-12-12 NOTE — Discharge Instructions (Signed)
You may alternate between Tylenol 1000 mg every 6 hours as needed for fever and pain and ibuprofen 800 mg every 8 hours as needed for fever and pain.   To find a primary care or specialty doctor please call [phone] or [phone] to access "[facility] Doctor Service."  You may also go on the [facility] website at [email or web address]  There are also multiple [facility] and [facility] practices throughout the [place] that are frequently accepting new patients. You may find a clinic that is close to your home and contact them.  [facility] -  [address] [place] [phone]  [facility] in [place] (also locations in [place] and [place]) -  [place] [address]  [place] [address] [phone]

## 2017-11-20 ENCOUNTER — Emergency Department (HOSPITAL_COMMUNITY)
Admission: EM | Admit: 2017-11-20 | Discharge: 2017-11-20 | Disposition: A | Discharge status: Home / Self Care | Payer: Self-pay | Attending: Emergency Medicine | Admitting: Emergency Medicine

## 2017-11-20 ENCOUNTER — Other Ambulatory Visit: Payer: Self-pay

## 2017-11-20 ENCOUNTER — Emergency Department (HOSPITAL_COMMUNITY): Payer: Self-pay

## 2017-11-20 ENCOUNTER — Encounter (HOSPITAL_COMMUNITY): Payer: Self-pay | Admitting: Emergency Medicine

## 2017-11-20 DIAGNOSIS — R112 Nausea with vomiting, unspecified: Secondary | ICD-10-CM

## 2017-11-20 DIAGNOSIS — R7989 Other specified abnormal findings of blood chemistry: Secondary | ICD-10-CM

## 2017-11-20 DIAGNOSIS — R945 Abnormal results of liver function studies: Secondary | ICD-10-CM | POA: Insufficient documentation

## 2017-11-20 LAB — COMPREHENSIVE METABOLIC PANEL
ALT: 329 U/L — AB (ref 0–44)
AST: 142 U/L — AB (ref 15–41)
Albumin: 3.8 g/dL (ref 3.5–5.0)
Alkaline Phosphatase: 59 U/L (ref 38–126)
Anion gap: 13 (ref 5–15)
BUN: 18 mg/dL (ref 6–20)
CHLORIDE: 100 mmol/L (ref 98–111)
CO2: 25 mmol/L (ref 22–32)
CREATININE: 0.91 mg/dL (ref 0.61–1.24)
Calcium: 9 mg/dL (ref 8.9–10.3)
GFR calc Af Amer: >60 mL/min (ref >60)
Glucose, Bld: 178 mg/dL — ABNORMAL HIGH (ref 70–99)
POTASSIUM: 3.2 mmol/L — AB (ref 3.5–5.1)
SODIUM: 138 mmol/L (ref 135–145)
Total Bilirubin: 0.8 mg/dL (ref 0.3–1.2)
Total Protein: 6.8 g/dL (ref 6.5–8.1)

## 2017-11-20 LAB — LIPASE, BLOOD: Lipase: 32 U/L (ref 11–51)

## 2017-11-20 LAB — CBC
HEMATOCRIT: 44.7 % (ref 39.0–52.0)
Hemoglobin: 13.9 g/dL (ref 13.0–17.0)
MCH: 25.9 pg — AB (ref 26.0–34.0)
MCHC: 31.1 g/dL (ref 30.0–36.0)
MCV: 83.4 fL (ref 78.0–100.0)
Platelets: 236 10*3/uL (ref 150–400)
RBC: 5.36 MIL/uL (ref 4.22–5.81)
RDW: 13.2 % (ref 11.5–15.5)
WBC: 8.4 10*3/uL (ref 4.0–10.5)

## 2017-11-20 MED ORDER — ONDANSETRON 4 MG PO TBDP: 4.0000 mg | ORAL_TABLET | Freq: Three times a day (TID) | ORAL | 0 refills | Status: DC | PRN

## 2017-11-20 MED ORDER — KETOROLAC TROMETHAMINE 30 MG/ML IJ SOLN
30.0000 mg | Freq: Once | INTRAMUSCULAR | Status: AC
  Administered 2017-11-20: 30 mg via INTRAVENOUS
  Filled 2017-11-20: qty 1

## 2017-11-20 MED ORDER — SODIUM CHLORIDE 0.9 % IV BOLUS
1000.0000 mL | Freq: Once | INTRAVENOUS | Status: AC
  Administered 2017-11-20: 1000 mL via INTRAVENOUS

## 2017-11-20 MED ORDER — IOHEXOL 300 MG/ML  SOLN
100.0000 mL | Freq: Once | INTRAMUSCULAR | Status: AC | PRN
  Administered 2017-11-20: 100 mL via INTRAVENOUS

## 2017-11-20 MED ORDER — ONDANSETRON HCL 4 MG/2ML IJ SOLN
4.0000 mg | Freq: Once | INTRAMUSCULAR | Status: AC
  Administered 2017-11-20: 4 mg via INTRAVENOUS
  Filled 2017-11-20: qty 2

## 2017-11-20 NOTE — ED Triage Notes (Signed)
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 Reports dizziness and nausea, some muscle cramps and emesis. Tolerating oral fluids. Ambulatory at a steady gait without assistance. Wants work note so he will not loose his job.  Pt reports hx epilepsy with medication noncompliance d/t cost. No neurologist.

## 2017-11-20 NOTE — ED Provider Notes (Signed)
[facility] EMERGENCY DEPARTMENT Provider Note   CSN: [id number] Arrival date & time: 11/20/17  9485     History   Chief Complaint Chief Complaint  Patient presents with  . Heat Exposure  . Nausea    HPI [name] is a 47 y.o. male with history of epilepsy and psoriasis is here for evaluation of nausea and vomiting for 4 days.  He has no abdominal pain.  Also reports ongoing, chronic decreased appetite, intermittent bilateral blurred vision worse when he looks at bright lights at nighttime, dizziness, "bad balance", headaches, generalized body aches, fingers locking up, muscle cramps, fatigue, increased dark scars on his body, states any mosquito bite or cut leaves a very dar scar.  These symptoms have been going on for more than 10 years, every now and then they "flareup".  He thinks nausea vomiting from heat exhaustion.  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  He called his job this morning but they told him he needed a work note. Has been drinking a lot of Gatorade and water.  Does not take any medications. Remote h/o ETOH abuse but not recently. No illicit drug use. Recent tylenol and ibuprofen usage this weekend for "all over pain". No recent travel.   He denies any fevers, chills, chest pain, cough, sore throat, abdominal pain, hematemesis, changes in stool, dysuria.HPI  Past Medical History:  Diagnosis Date  . Cirrhosis ([place])   . Seizures ([place])     There are no active problems to display for this patient.   History reviewed. No pertinent surgical history.      Home Medications    Prior to Admission medications   Medication Sig Start Date End Date Taking? Authorizing Provider  bismuth subsalicylate (PEPTO BISMOL) 262 MG chewable tablet Chew 524 mg by mouth as needed for indigestion.   Yes [provider]    diphenhydrAMINE (BENADRYL) 25 MG tablet Take 25 mg by mouth 2 (two) times daily as needed for itching or sleep.   Yes [provider]  fexofenadine (ALLEGRA) 180 MG tablet Take 180 mg by mouth daily as needed for allergies or rhinitis.   Yes [provider]  ibuprofen (ADVIL,MOTRIN) 200 MG tablet Take 600-800 mg by mouth every 6 (six) hours as needed for moderate pain.   Yes [provider]  naproxen (NAPROSYN) 375 MG tablet Take 1 tablet (375 mg total) by mouth 2 (two) times daily with a meal. Patient not taking: Reported on 11/20/2017 12/11/15   Frederica Kuster, PA-C  ondansetron (ZOFRAN ODT) 4 MG disintegrating tablet Take 1 tablet (4 mg total) by mouth every 8 (eight) hours as needed for nausea or vomiting. 11/20/17   Kinnie Feil, PA-C  ondansetron (ZOFRAN) 4 MG tablet Take 1 tablet (4 mg total) by mouth every 8 (eight) hours as needed for nausea or vomiting. Patient not taking: Reported on 11/20/2017 12/11/15   Frederica Kuster, PA-C    Family History History reviewed. No pertinent family history.  Social History Social History   Tobacco Use  . Smoking status: Never Smoker  . Smokeless tobacco: Never Used  Substance Use Topics  . Alcohol use: No  . Drug use: No     Allergies   Penicillins   Review of Systems Review of Systems  Constitutional: Positive for fatigue.  Gastrointestinal: Positive for nausea and vomiting.  Musculoskeletal: Positive for myalgias.       Finger cramping  Skin: Positive for rash.       scarring  Neurological: Positive for dizziness and headaches.  All other systems reviewed and are negative.    Physical Exam Updated Vital Signs BP (!) 181/96 (BP Location: Right Arm)   Pulse 63   Temp (!) 97.3 F (36.3 C) (Oral)   Resp 16   Ht 5\' 9"  (1.753 m)   Wt 63.5 kg   SpO2 100%   BMI 20.67 kg/m   Physical Exam  Constitutional: He is oriented to person, place, and time. He appears well-developed and well-nourished.  No distress.  NAD.  HENT:  Head: Normocephalic and atraumatic.  Right Ear: External ear normal.  Left Ear: External ear normal.  Nose: Nose normal.  MMM. Poor dentition.   Eyes: Conjunctivae and EOM are normal.  Neck: Normal range of motion. Neck supple.  Cardiovascular: Normal rate, regular rhythm and normal heart sounds.  Pulmonary/Chest: Effort normal and breath sounds normal.  Abdominal: Soft. Bowel sounds are normal. There is no tenderness.  No suprapubic or CVA tenderness. No G/R/R. Active BS in lower quadrants. Negative Murphy's and Mcburney's.   Musculoskeletal: Normal range of motion. He exhibits no deformity.  Neurological: He is alert and oriented to person, place, and time.  Skin: Skin is warm and dry. Capillary refill takes less than 2 seconds.  Multiple areas of excoriation in upper and lower extremities. Multiple hyperpigmented macules in lower and upper extremities.   Psychiatric: He has a normal mood and affect. His behavior is normal. Judgment and thought content normal.  Nursing note and vitals reviewed.    ED Treatments / Results  Labs (all labs ordered are listed, but only abnormal results are displayed) Labs Reviewed  COMPREHENSIVE METABOLIC PANEL - Abnormal; Notable for the following components:      Result Value   Potassium 3.2 (*)    Glucose, Bld 178 (*)    AST 142 (*)    ALT 329 (*)    All other components within normal limits  CBC - Abnormal; Notable for the following components:   MCH 25.9 (*)    All other components within normal limits  LIPASE, BLOOD  HEPATITIS PANEL, ACUTE    EKG None  Radiology Ct Abdomen Pelvis W Contrast  Result Date: 11/20/2017 CLINICAL DATA:  Dizziness, nausea, muscle cramps and emesis. EXAM: CT ABDOMEN AND PELVIS WITH CONTRAST TECHNIQUE: Multidetector CT imaging of the abdomen and pelvis was performed using the standard protocol following bolus administration of intravenous contrast. CONTRAST:  189mL OMNIPAQUE  IOHEXOL 300 MG/ML  SOLN COMPARISON:  Abdominal ultrasound 11/20/2017. FINDINGS: Lower chest: Lung bases show no acute findings. Heart size normal. No pericardial or pleural effusion. Distal esophagus is grossly unremarkable. Hepatobiliary: Subcentimeter low-attenuation lesion in the inferior right hepatic lobe is too small to characterize. A small stone is seen in the gallbladder. Liver and gallbladder are otherwise unremarkable. No biliary ductal dilatation. Pancreas: Negative. Spleen: Negative. Adrenals/Urinary Tract: Adrenal glands and kidneys are unremarkable. Ureters are decompressed. Bladder is grossly unremarkable. Stomach/Bowel: Stomach, small bowel, appendix and colon are unremarkable. Vascular/Lymphatic: Atherosclerotic calcification of the arterial vasculature without abdominal aortic aneurysm. No pathologically enlarged lymph nodes. Reproductive: Prostate is visualized. Other: No free fluid.  Mesenteries and peritoneum are unremarkable. Musculoskeletal: Degenerative changes in the spine. No worrisome lytic or sclerotic lesions. Partial fusion of the sacroiliac joints. IMPRESSION: 1. No findings to explain the patient's symptoms. 2.  Aortic atherosclerosis (  ICD10-170.0). 3. Cholelithiasis. Electronically Signed   By: Lorin Picket M.D.   On: 11/20/2017 12:10   US Abdomen Limited Ruq  Result Date: 11/20/2017 CLINICAL DATA:  Elevated liver function tests. EXAM: ULTRASOUND ABDOMEN LIMITED RIGHT UPPER QUADRANT COMPARISON:  None. FINDINGS: Gallbladder: No gallstones or wall thickening visualized. No sonographic Murphy sign noted by sonographer. Probable sludge is noted within gallbladder lumen. Common bile duct: Diameter: 5.6 mm which is within normal limits. Liver: Mildly increased echogenicity of hepatic parenchyma is noted suggesting fatty infiltration. 2.8 x 2.0 x 1.3 cm complex hypoechoic area with central echogenic foci is noted adjacent to gallbladder fossa in right hepatic lobe. Portal vein is  patent on color Doppler imaging with normal direction of blood flow towards the liver. IMPRESSION: Probable gallbladder sludge.  No evidence of cholecystitis. Probable fatty infiltration of the liver. 2.8 x 2.0 x 1.3 cm complex hypoechoic area with central echogenic foci is noted adjacent to gallbladder fossa in right hepatic lobe. While this may simply represent fatty sparing, its complex appearance does not preclude the possibility of neoplasm. Further evaluation with CT or MRI scan with intravenous contrast administration is recommended. Electronically Signed   By: Marijo Conception, M.D.   On: 11/20/2017 10:58    Procedures Procedures (including critical care time)  Medications Ordered in ED Medications  sodium chloride 0.9 % bolus 1,000 mL (0 mLs Intravenous Stopped 11/20/17 1142)  ondansetron (ZOFRAN) injection 4 mg (4 mg Intravenous Given 11/20/17 0905)  ketorolac (TORADOL) 30 MG/ML injection 30 mg (30 mg Intravenous Given 11/20/17 0905)  iohexol (OMNIPAQUE) 300 MG/ML solution 100 mL (100 mLs Intravenous Contrast Given 11/20/17 1200)     Initial Impression / Assessment and Plan / ED Course  I have reviewed the triage vital signs and the nursing notes.  Pertinent labs & imaging results that were available during my care of the patient were reviewed by me and considered in my medical decision making (see chart for details).  Clinical Course as of Nov 20 1321  Wed Nov 20, 2017  0942 AST(!): 142 [CG]  0943 ALT(!): 329 [CG]  1104 IMPRESSION: Probable gallbladder sludge. No evidence of cholecystitis.  Probable fatty infiltration of the liver.  2.8 x 2.0 x 1.3 cm complex hypoechoic area with central echogenic foci is noted adjacent to gallbladder fossa in right hepatic lobe. While this may simply represent fatty sparing, its complex appearance does not preclude the possibility of neoplasm. Further evaluation with CT or MRI scan with intravenous contrast administration is  recommended.   Electronically Signed By: Marijo Conception, M.D. On: 11/20/2017 10:58    US Abdomen Limited RUQ [CG]  1219 Hepatobiliary: Subcentimeter low-attenuation lesion in the inferior right hepatic lobe is too small to characterize. A small stone is seen in the gallbladder. Liver and gallbladder are otherwise unremarkable. No biliary ductal dilatation.  IMPRESSION: 1. No findings to explain the patient's symptoms. 2. Aortic atherosclerosis (ICD10-170.0). 3. Cholelithiasis.     CT ABDOMEN PELVIS W CONTRAST [CG]    Clinical Course User Index [CG] Kinnie Feil, PA-C    47 year old male with nausea, vomiting over the last 4 days.  He is HD stable, afebrile, well appearing without abdominal tenderness or peritonitis. He denies abd pain.  Unclear etiology of chronic complaints, acute changes including nausea, vomiting may be viral gastroenteritis vs cholelithiasis vs gastritis.  He has no abdominal pain or tenderness on exam making appy, cholecystitis, SBO, perforation unlikely.   Work-up today remarkable for K3.2.  Creatinine  WNL. Surprisingly, AST 143, ALT 329. Will obtain RUQ [place].   0945: re-evaluated pt who is tolerating water without n/v.  Repeat abd exam benign.    1226: US showed fatty infiltration of liver, gallbladder sludge and concerning foci on liver recommended CT AP. CT shows small stone in gallbladder without dilation of duct. Repeat abd exam benign. Hepatitis panel added.  Will consult surgery to confirm to emergent interventions recommended.   1300: general surgery to see pt, unlikely this is cholecystitis or cholelithiasis.  No heavy use of ETOH, tylenol, recent travel, h/o hepatitis.  If cleared by general surgery, anticipate discharge with GI f/u for LFT re-check, zofran, oral hydration. Discussed plan with pt who is in agreement.   Final Clinical Impressions(s) / ED Diagnoses   Final diagnoses:  LFT elevation  Nausea and vomiting in adult     ED Discharge Orders         Ordered    ondansetron (ZOFRAN ODT) 4 MG disintegrating tablet  Every 8 hours PRN     11/20/17 1323           Kinnie Feil, PA-C 11/20/17 1323    Fredia Sorrow, MD 11/22/17 [phone]

## 2017-11-20 NOTE — ED Notes (Signed)
Gave pt crackers and water for PO challenge 

## 2017-11-20 NOTE — Consult Note (Signed)
[facility] Consult/Admission Note  [name] 07/09/1970  [id number].    Requesting MD: Carmon Sails, PA-C Chief Complaint/Reason for Consult: elevated LFT's, N & V  HPI:   Pt is a 47 yo male with a history of epilepsy and psoriasis who presented to the emergency department with 4 days of nausea and vomiting. He denies abdominal pain. He states it has been going on for 6-7 years intermittently. Never with abdominal pain. He thinks it might be related to his seizure symptoms. He has a states he works in a very warm environment which she feels may be attributing to this. He states 4 days ago he began having nausea and vomiting with associated diarrhea. This episode similar to previous episodes. Patient says he doesn't eat much and drink a lot of fluids. He says at times his emesis is brown in color. No blood or black stools. No other associated symptoms. Patient states last night he was able to keep a [employer] sandwich down without nausea or vomiting. He wanted to call off work today and needed a work note so he presented to the ER. He states he should have insurance coverage with his job soon. I instruct the patient that he needs a PCP to follow up with regarding the symptoms. I do not suspect this is gallbladder.  Patient denies illicit drug use, alcohol use, recent travel, previous abdominal surgeries.  ROS:  Review of Systems  Constitutional: Negative for chills, diaphoresis and fever.  HENT: Negative for sore throat.   Respiratory: Negative for cough and shortness of breath.   Cardiovascular: Negative for chest pain.  Gastrointestinal: Positive for diarrhea, nausea and vomiting. Negative for abdominal pain, blood in stool, constipation and melena.  Genitourinary: Negative for dysuria.  Skin: Negative for rash.  Neurological: Negative for dizziness, focal weakness and loss of consciousness.  Psychiatric/Behavioral: Negative for substance abuse.  All other systems  reviewed and are negative.    History reviewed. No pertinent family history.  Past Medical History:  Diagnosis Date  . Cirrhosis ([place])   . Seizures ([place])     History reviewed. No pertinent surgical history.  Social History:  reports that he has never smoked. He has never used smokeless tobacco. He reports that he does not drink alcohol or use drugs.  Allergies:  Allergies  Allergen Reactions  . Penicillins Other (See Comments)    Childhood allergy      (Not in a hospital admission)  Blood pressure (!) 171/109, pulse (!) 54, temperature (!) 97.3 F (36.3 C), temperature source Oral, resp. rate 16, height 5' 9" (1.753 m), weight 63.5 kg, SpO2 99 %.  Physical Exam  Constitutional: He is oriented to person, place, and time. He appears well-developed. No distress.  Thin male  HENT:  Head: Normocephalic and atraumatic.  Nose: Nose normal.  Mouth/Throat: Uvula is midline, oropharynx is clear and moist and mucous membranes are normal. Abnormal dentition (numerous missing/black teeth). No oropharyngeal exudate.  Eyes: Pupils are equal, round, and reactive to light. Conjunctivae are normal. Right eye exhibits no discharge. Left eye exhibits no discharge. No scleral icterus.  Neck: Normal range of motion. Neck supple. No thyromegaly present.  Cardiovascular: Normal rate, regular rhythm, normal heart sounds and intact distal pulses.  No murmur heard. Pulses:      Radial pulses are 2+ on the right side, and 2+ on the left side.       Dorsalis pedis pulses are 2+ on the right side, and 2+ on the left  side.  Pulmonary/Chest: Effort normal and breath sounds normal. No respiratory distress. He has no wheezes. He has no rhonchi. He has no rales.  Abdominal: Soft. Normal appearance and bowel sounds are normal. He exhibits no distension. There is no hepatosplenomegaly. There is no tenderness. There is no rigidity and no guarding.  Musculoskeletal: Normal range of motion. He exhibits no  edema, tenderness or deformity.  Lymphadenopathy:    He has no cervical adenopathy.  Neurological: He is alert and oriented to person, place, and time.  Skin: Skin is warm and dry. He is not diaphoretic.  Nursing note and vitals reviewed.   Results for orders placed or performed during the hospital encounter of 11/20/17 (from the past 48 hour(s))  Lipase, blood     Status: None   Collection Time: 11/20/17  7:13 AM  Result Value Ref Range   Lipase 32 11 - 51 U/L    Comment: Performed at [facility], [place] [address] [place], [place] [address]  Comprehensive metabolic panel     Status: Abnormal   Collection Time: 11/20/17  7:13 AM  Result Value Ref Range   Sodium 138 135 - 145 mmol/L   Potassium 3.2 (L) 3.5 - 5.1 mmol/L   Chloride 100 98 - 111 mmol/L   CO2 25 22 - 32 mmol/L   Glucose, Bld 178 (H) 70 - 99 mg/dL   BUN 18 6 - 20 mg/dL   Creatinine, Ser 0.91 0.61 - 1.24 mg/dL   Calcium 9.0 8.9 - 10.3 mg/dL   Total Protein 6.8 6.5 - 8.1 g/dL   Albumin 3.8 3.5 - 5.0 g/dL   AST 142 (H) 15 - 41 U/L   ALT 329 (H) 0 - 44 U/L   Alkaline Phosphatase 59 38 - 126 U/L   Total Bilirubin 0.8 0.3 - 1.2 mg/dL   GFR calc non Af Amer >60 >60 mL/min   GFR calc Af Amer >60 >60 mL/min    Comment: (NOTE) The eGFR has been calculated using the CKD EPI equation. This calculation has not been validated in all clinical situations. eGFR's persistently <60 mL/min signify possible Chronic Kidney Disease.    Anion gap 13 5 - 15    Comment: Performed at [facility] [address] [place], [place] [address]  CBC     Status: Abnormal   Collection Time: 11/20/17  7:13 AM  Result Value Ref Range   WBC 8.4 4.0 - 10.5 K/uL   RBC 5.36 4.22 - 5.81 MIL/uL   Hemoglobin 13.9 13.0 - 17.0 g/dL   HCT 44.7 39.0 - 52.0 %   MCV 83.4 78.0 - 100.0 fL   MCH 25.9 (L) 26.0 - 34.0 pg   MCHC 31.1 30.0 - 36.0 g/dL   RDW 13.2 11.5 - 15.5 %   Platelets 236 150 - 400 K/uL    Comment: Performed at [facility], [place] [address] [place], [place] [address]   Ct Abdomen Pelvis W Contrast  Result Date: 11/20/2017 CLINICAL DATA:  Dizziness, nausea, muscle cramps and emesis. EXAM: CT ABDOMEN AND PELVIS WITH CONTRAST TECHNIQUE: Multidetector CT imaging of the abdomen and pelvis was performed using the standard protocol following bolus administration of intravenous contrast. CONTRAST:  151m OMNIPAQUE IOHEXOL 300 MG/ML  SOLN COMPARISON:  Abdominal ultrasound 11/20/2017. FINDINGS: Lower chest: Lung bases show no acute findings. Heart size normal. No pericardial or pleural effusion. Distal esophagus is grossly unremarkable. Hepatobiliary: Subcentimeter low-attenuation lesion in the inferior right hepatic  lobe is too small to characterize. A small stone is seen in the gallbladder. Liver and gallbladder are otherwise unremarkable. No biliary ductal dilatation. Pancreas: Negative. Spleen: Negative. Adrenals/Urinary Tract: Adrenal glands and kidneys are unremarkable. Ureters are decompressed. Bladder is grossly unremarkable. Stomach/Bowel: Stomach, small bowel, appendix and colon are unremarkable. Vascular/Lymphatic: Atherosclerotic calcification of the arterial vasculature without abdominal aortic aneurysm. No pathologically enlarged lymph nodes. Reproductive: Prostate is visualized. Other: No free fluid.  Mesenteries and peritoneum are unremarkable. Musculoskeletal: Degenerative changes in the spine. No worrisome lytic or sclerotic lesions. Partial fusion of the sacroiliac joints. IMPRESSION: 1. No findings to explain the patient's symptoms. 2.  Aortic atherosclerosis (ICD10-170.0). 3. Cholelithiasis. Electronically Signed   By: Lorin Picket M.D.   On: 11/20/2017 12:10   US Abdomen Limited Ruq  Result Date: 11/20/2017 CLINICAL DATA:  Elevated liver function tests. EXAM: ULTRASOUND ABDOMEN LIMITED RIGHT UPPER QUADRANT COMPARISON:  None. FINDINGS: Gallbladder: No gallstones or wall thickening visualized. No  sonographic Murphy sign noted by sonographer. Probable sludge is noted within gallbladder lumen. Common bile duct: Diameter: 5.6 mm which is within normal limits. Liver: Mildly increased echogenicity of hepatic parenchyma is noted suggesting fatty infiltration. 2.8 x 2.0 x 1.3 cm complex hypoechoic area with central echogenic foci is noted adjacent to gallbladder fossa in right hepatic lobe. Portal vein is patent on color Doppler imaging with normal direction of blood flow towards the liver. IMPRESSION: Probable gallbladder sludge.  No evidence of cholecystitis. Probable fatty infiltration of the liver. 2.8 x 2.0 x 1.3 cm complex hypoechoic area with central echogenic foci is noted adjacent to gallbladder fossa in right hepatic lobe. While this may simply represent fatty sparing, its complex appearance does not preclude the possibility of neoplasm. Further evaluation with CT or MRI scan with intravenous contrast administration is recommended. Electronically Signed   By: Marijo Conception, M.D.   On: 11/20/2017 10:58      Assessment/Plan  Nausea and vomiting Imaging, exam and history not concerning for cholecystitis or choledocholithiasis. No surgical indications  Patient was able to each food last night without nausea, vomiting or abdominal pain Instructed patient to follow-up with a primary care provider for further evaluation of his symptoms  Thank you for the consult  Kalman Drape, [facility] 11/20/2017, 1:22 PM Pager: [phone] Consults: [phone] Mon-Fri 7:00 am-4:30 pm Sat-Sun 7:00 am-11:30 am

## 2017-11-20 NOTE — Discharge Instructions (Addendum)
you were seen in the emergency department for nausea and vomiting.  Your lab work showed elevated liver function numbers.  Ultrasound and scans were done that showed gallbladder sludge and one gallbladder stone as well as fatty infiltration of your liver.  General surgery evaluated today and they do not think that this is your gallbladder.  Your hepatitis panel is pending.  Avoid any alcohol, Tylenol.  You need to follow-up with gastroenterology in the next week to have your liver function numbers rechecked.  Return to the ER for fevers, chills, green vomit, bloody vomit, abdominal pain.

## 2017-11-21 LAB — HEPATITIS PANEL, ACUTE
HEP A IGM: NEGATIVE
HEP B S AG: NEGATIVE
Hep B C IgM: NEGATIVE

## 2019-04-05 ENCOUNTER — Inpatient Hospital Stay (HOSPITAL_COMMUNITY)
Admission: EM | Admit: 2019-04-05 | Discharge: 2019-04-19 | DRG: 673 | Disposition: A | Discharge status: Home Health | Payer: Medicaid Other | Attending: Internal Medicine | Admitting: Internal Medicine

## 2019-04-05 ENCOUNTER — Emergency Department (HOSPITAL_COMMUNITY): Payer: Medicaid Other

## 2019-04-05 ENCOUNTER — Other Ambulatory Visit: Payer: Self-pay

## 2019-04-05 ENCOUNTER — Inpatient Hospital Stay (HOSPITAL_COMMUNITY): Payer: Medicaid Other

## 2019-04-05 ENCOUNTER — Encounter (HOSPITAL_COMMUNITY): Payer: Self-pay

## 2019-04-05 DIAGNOSIS — B9620 Unspecified Escherichia coli [E. coli] as the cause of diseases classified elsewhere: Secondary | ICD-10-CM | POA: Diagnosis not present

## 2019-04-05 DIAGNOSIS — J189 Pneumonia, unspecified organism: Secondary | ICD-10-CM

## 2019-04-05 DIAGNOSIS — G936 Cerebral edema: Secondary | ICD-10-CM | POA: Diagnosis present

## 2019-04-05 DIAGNOSIS — Z781 Physical restraint status: Secondary | ICD-10-CM | POA: Diagnosis not present

## 2019-04-05 DIAGNOSIS — M6282 Rhabdomyolysis: Secondary | ICD-10-CM | POA: Diagnosis present

## 2019-04-05 DIAGNOSIS — I160 Hypertensive urgency: Secondary | ICD-10-CM | POA: Diagnosis present

## 2019-04-05 DIAGNOSIS — N179 Acute kidney failure, unspecified: Secondary | ICD-10-CM | POA: Diagnosis present

## 2019-04-05 DIAGNOSIS — E875 Hyperkalemia: Secondary | ICD-10-CM | POA: Diagnosis present

## 2019-04-05 DIAGNOSIS — G0481 Other encephalitis and encephalomyelitis: Secondary | ICD-10-CM | POA: Diagnosis present

## 2019-04-05 DIAGNOSIS — R630 Anorexia: Secondary | ICD-10-CM | POA: Diagnosis present

## 2019-04-05 DIAGNOSIS — Z20822 Contact with and (suspected) exposure to covid-19: Secondary | ICD-10-CM | POA: Diagnosis present

## 2019-04-05 DIAGNOSIS — K8020 Calculus of gallbladder without cholecystitis without obstruction: Secondary | ICD-10-CM | POA: Diagnosis present

## 2019-04-05 DIAGNOSIS — R0602 Shortness of breath: Secondary | ICD-10-CM

## 2019-04-05 DIAGNOSIS — G9341 Metabolic encephalopathy: Secondary | ICD-10-CM

## 2019-04-05 DIAGNOSIS — K253 Acute gastric ulcer without hemorrhage or perforation: Secondary | ICD-10-CM

## 2019-04-05 DIAGNOSIS — N390 Urinary tract infection, site not specified: Secondary | ICD-10-CM | POA: Diagnosis not present

## 2019-04-05 DIAGNOSIS — E1165 Type 2 diabetes mellitus with hyperglycemia: Secondary | ICD-10-CM | POA: Diagnosis present

## 2019-04-05 DIAGNOSIS — N170 Acute kidney failure with tubular necrosis: Principal | ICD-10-CM | POA: Diagnosis present

## 2019-04-05 DIAGNOSIS — R569 Unspecified convulsions: Secondary | ICD-10-CM

## 2019-04-05 DIAGNOSIS — R278 Other lack of coordination: Secondary | ICD-10-CM | POA: Diagnosis present

## 2019-04-05 DIAGNOSIS — Z560 Unemployment, unspecified: Secondary | ICD-10-CM

## 2019-04-05 DIAGNOSIS — R197 Diarrhea, unspecified: Secondary | ICD-10-CM | POA: Diagnosis not present

## 2019-04-05 DIAGNOSIS — F1210 Cannabis abuse, uncomplicated: Secondary | ICD-10-CM | POA: Diagnosis present

## 2019-04-05 DIAGNOSIS — G40101 Localization-related (focal) (partial) symptomatic epilepsy and epileptic syndromes with simple partial seizures, not intractable, with status epilepticus: Secondary | ICD-10-CM | POA: Diagnosis not present

## 2019-04-05 DIAGNOSIS — E790 Hyperuricemia without signs of inflammatory arthritis and tophaceous disease: Secondary | ICD-10-CM | POA: Diagnosis present

## 2019-04-05 DIAGNOSIS — R34 Anuria and oliguria: Secondary | ICD-10-CM | POA: Diagnosis not present

## 2019-04-05 DIAGNOSIS — K264 Chronic or unspecified duodenal ulcer with hemorrhage: Secondary | ICD-10-CM

## 2019-04-05 DIAGNOSIS — E872 Acidosis: Secondary | ICD-10-CM | POA: Diagnosis present

## 2019-04-05 DIAGNOSIS — Z880 Allergy status to penicillin: Secondary | ICD-10-CM

## 2019-04-05 DIAGNOSIS — G92 Toxic encephalopathy: Secondary | ICD-10-CM | POA: Diagnosis present

## 2019-04-05 DIAGNOSIS — K760 Fatty (change of) liver, not elsewhere classified: Secondary | ICD-10-CM | POA: Diagnosis present

## 2019-04-05 DIAGNOSIS — Z6824 Body mass index (BMI) 24.0-24.9, adult: Secondary | ICD-10-CM | POA: Diagnosis not present

## 2019-04-05 DIAGNOSIS — R4182 Altered mental status, unspecified: Secondary | ICD-10-CM

## 2019-04-05 DIAGNOSIS — R7401 Elevation of levels of liver transaminase levels: Secondary | ICD-10-CM

## 2019-04-05 DIAGNOSIS — T380X5A Adverse effect of glucocorticoids and synthetic analogues, initial encounter: Secondary | ICD-10-CM | POA: Diagnosis not present

## 2019-04-05 DIAGNOSIS — Z888 Allergy status to other drugs, medicaments and biological substances status: Secondary | ICD-10-CM

## 2019-04-05 DIAGNOSIS — D62 Acute posthemorrhagic anemia: Secondary | ICD-10-CM

## 2019-04-05 DIAGNOSIS — Z833 Family history of diabetes mellitus: Secondary | ICD-10-CM

## 2019-04-05 LAB — COMPREHENSIVE METABOLIC PANEL
ALT: 156 U/L — ABNORMAL HIGH (ref 0–44)
AST: 222 U/L — ABNORMAL HIGH (ref 15–41)
Albumin: 4.1 g/dL (ref 3.5–5.0)
Alkaline Phosphatase: 67 U/L (ref 38–126)
Anion gap: 24 — ABNORMAL HIGH (ref 5–15)
BUN: 115 mg/dL — ABNORMAL HIGH (ref 6–20)
CO2: 13 mmol/L — ABNORMAL LOW (ref 22–32)
Calcium: 8.9 mg/dL (ref 8.9–10.3)
Chloride: 98 mmol/L (ref 98–111)
Creatinine, Ser: 10.38 mg/dL — ABNORMAL HIGH (ref 0.61–1.24)
GFR calc Af Amer: 6 mL/min — ABNORMAL LOW (ref >60)
GFR calc non Af Amer: 5 mL/min — ABNORMAL LOW (ref >60)
Glucose, Bld: 167 mg/dL — ABNORMAL HIGH (ref 70–99)
Potassium: 4.8 mmol/L (ref 3.5–5.1)
Sodium: 135 mmol/L (ref 135–145)
Total Bilirubin: 0.7 mg/dL (ref 0.3–1.2)
Total Protein: 7.7 g/dL (ref 6.5–8.1)

## 2019-04-05 LAB — ETHANOL: Alcohol, Ethyl (B): <10 mg/dL (ref <10)

## 2019-04-05 LAB — RAPID URINE DRUG SCREEN, HOSP PERFORMED
Amphetamines: NOT DETECTED
Barbiturates: NOT DETECTED
Benzodiazepines: NOT DETECTED
Cocaine: NOT DETECTED
Opiates: NOT DETECTED
Tetrahydrocannabinol: POSITIVE — AB

## 2019-04-05 LAB — URINALYSIS, ROUTINE W REFLEX MICROSCOPIC
Bilirubin Urine: NEGATIVE
Glucose, UA: 50 mg/dL — AB
Ketones, ur: NEGATIVE mg/dL
Leukocytes,Ua: NEGATIVE
Nitrite: NEGATIVE
Protein, ur: 100 mg/dL — AB
Specific Gravity, Urine: 1.014 (ref 1.005–1.030)
pH: 5 (ref 5.0–8.0)

## 2019-04-05 LAB — CBC
HCT: 48.3 % (ref 39.0–52.0)
Hemoglobin: 15.7 g/dL (ref 13.0–17.0)
MCH: 26.2 pg (ref 26.0–34.0)
MCHC: 32.5 g/dL (ref 30.0–36.0)
MCV: 80.6 fL (ref 80.0–100.0)
Platelets: 307 10*3/uL (ref 150–400)
RBC: 5.99 MIL/uL — ABNORMAL HIGH (ref 4.22–5.81)
RDW: 13.2 % (ref 11.5–15.5)
WBC: 11.9 10*3/uL — ABNORMAL HIGH (ref 4.0–10.5)
nRBC: 0 % (ref 0.0–0.2)

## 2019-04-05 LAB — CBG MONITORING, ED
Glucose-Capillary: 295 mg/dL — ABNORMAL HIGH (ref 70–99)
Glucose-Capillary: 161 mg/dL — ABNORMAL HIGH (ref 70–99)

## 2019-04-05 LAB — SODIUM, URINE, RANDOM: Sodium, Ur: 52 mmol/L

## 2019-04-05 LAB — AMMONIA: Ammonia: 24 umol/L (ref 9–35)

## 2019-04-05 LAB — BLOOD GAS, VENOUS
Acid-base deficit: 9.3 mmol/L — ABNORMAL HIGH (ref 0.0–2.0)
Bicarbonate: 17 mmol/L — ABNORMAL LOW (ref 20.0–28.0)
O2 Saturation: 65.7 %
Patient temperature: 98.6
pCO2, Ven: 39.5 mmHg — ABNORMAL LOW (ref 44.0–60.0)
pH, Ven: 7.26 (ref 7.250–7.430)
pO2, Ven: 43.5 mmHg (ref 32.0–45.0)

## 2019-04-05 LAB — LACTIC ACID, PLASMA
Lactic Acid, Venous: 2.7 mmol/L (ref 0.5–1.9)
Lactic Acid, Venous: 2 mmol/L (ref 0.5–1.9)

## 2019-04-05 LAB — CREATININE, URINE, RANDOM: Creatinine, Urine: 155.79 mg/dL

## 2019-04-05 LAB — CK: Total CK: 2955 U/L — ABNORMAL HIGH (ref 49–397)

## 2019-04-05 MED ORDER — SODIUM CHLORIDE 0.9 % IV BOLUS
1000.0000 mL | Freq: Once | INTRAVENOUS | Status: AC
  Administered 2019-04-05: 1000 mL via INTRAVENOUS

## 2019-04-05 MED ORDER — SODIUM CHLORIDE 0.9% FLUSH
3.0000 mL | Freq: Once | INTRAVENOUS | Status: AC
  Administered 2019-04-05: 3 mL via INTRAVENOUS

## 2019-04-05 MED ORDER — ACETAMINOPHEN 325 MG PO TABS
650.0000 mg | ORAL_TABLET | Freq: Four times a day (QID) | ORAL | Status: DC | PRN
  Administered 2019-04-08 – 2019-04-17 (×3): 650 mg via ORAL
  Filled 2019-04-05 (×3): qty 2

## 2019-04-05 MED ORDER — HEPARIN SODIUM (PORCINE) 5000 UNIT/ML IJ SOLN
5000.0000 [IU] | Freq: Three times a day (TID) | INTRAMUSCULAR | Status: DC
  Administered 2019-04-05 – 2019-04-07 (×6): 5000 [IU] via SUBCUTANEOUS
  Filled 2019-04-05 (×7): qty 1

## 2019-04-05 MED ORDER — LACTATED RINGERS IV SOLN: INTRAVENOUS | Status: AC

## 2019-04-05 MED ORDER — ONDANSETRON HCL 4 MG/2ML IJ SOLN: 4.0000 mg | Freq: Four times a day (QID) | INTRAMUSCULAR | Status: DC | PRN

## 2019-04-05 MED ORDER — LABETALOL HCL 5 MG/ML IV SOLN
5.0000 mg | INTRAVENOUS | Status: DC | PRN
  Administered 2019-04-10 (×2): 5 mg via INTRAVENOUS
  Filled 2019-04-05 (×4): qty 4

## 2019-04-05 MED ORDER — ACETAMINOPHEN 650 MG RE SUPP: 650.0000 mg | Freq: Four times a day (QID) | RECTAL | Status: DC | PRN

## 2019-04-05 MED ORDER — ONDANSETRON HCL 4 MG PO TABS: 4.0000 mg | ORAL_TABLET | Freq: Four times a day (QID) | ORAL | Status: DC | PRN

## 2019-04-05 NOTE — ED Provider Notes (Signed)
[facility] DEPT Provider Note   CSN: [id number] Arrival date & time: 04/05/19  1220     History Chief Complaint  Patient presents with  . Altered Mental Status    [name] is a 48 y.o. male with a past medical history of ?cirrhosis, seizures, who presents to ED for altered mental status.  Majority of history provided by sister at bedside.  Timeline is as follows:  03/31/2019: patient was in his usual state of health with normal p.o. intake.   04/01/2019: sister saw patient in the morning to take a shower. Did not see him the rest of the day as she assumed he went out with a friend.  04/02/2019: another family member saw patient in the morning briefly. Again did not see him the rest of the day after that and assumed he went out.  04/03/2019: no family member saw the patient; they believe he was in his room all day. Did not see him eat or drink anything.  04/04/2019: sister saw patient around 3pm and noticed he was confused, unable to remember his own birthday or family members.  They thought he was dehydrated.  Today the confusion got worse and did not improve with p.o. intake.   She states that this is not happened to patient in the past.  Denies any new medications, alcohol use, drug use other than marijuana use, injuries or falls, vomiting or sick contacts.  Patient states that he does not recall what has happened in the past week. Does report generalized pain throughout his entire body.  HPI     Past Medical History:  Diagnosis Date  . Cirrhosis ([place])   . Seizures [facility])     Patient Active Problem List   Diagnosis Date Noted  . Acute renal failure (ARF) ([place]) 04/05/2019    History reviewed. No pertinent surgical history.     History reviewed. No pertinent family history.  Social History   Tobacco Use  . Smoking status: Never Smoker  . Smokeless tobacco: Never Used  Substance Use Topics  . Alcohol use: No  . Drug use: No     Home Medications Prior to Admission medications   Medication Sig Start Date End Date Taking? Authorizing Provider  diphenhydrAMINE (BENADRYL) 25 MG tablet Take 25 mg by mouth 2 (two) times daily as needed for itching or sleep.   Yes [provider]  fexofenadine (ALLEGRA) 180 MG tablet Take 180 mg by mouth daily as needed for allergies or rhinitis.   Yes [provider]  ibuprofen (ADVIL,MOTRIN) 200 MG tablet Take 600-800 mg by mouth every 6 (six) hours as needed for moderate pain.   Yes [provider]  naproxen (NAPROSYN) 375 MG tablet Take 1 tablet (375 mg total) by mouth 2 (two) times daily with a meal. Patient not taking: Reported on 11/20/2017 12/11/15   Frederica Kuster, PA-C  ondansetron (ZOFRAN ODT) 4 MG disintegrating tablet Take 1 tablet (4 mg total) by mouth every 8 (eight) hours as needed for nausea or vomiting. Patient not taking: Reported on 04/05/2019 11/20/17   Kinnie Feil, PA-C  ondansetron (ZOFRAN) 4 MG tablet Take 1 tablet (4 mg total) by mouth every 8 (eight) hours as needed for nausea or vomiting. Patient not taking: Reported on 11/20/2017 12/11/15   Frederica Kuster, PA-C    Allergies    Penicillins and Sulfur  Review of Systems   Review of Systems  Unable to perform ROS: Mental status change  Musculoskeletal:  Positive for myalgias.    Physical Exam Updated Vital Signs BP (!) 176/110   Pulse 77   Temp 98.6 F (37 C)   Resp 17   Ht 5\' 9"  (1.753 m)   Wt 65 kg   SpO2 100%   BMI 21.16 kg/m   Physical Exam Vitals and nursing note reviewed.  Constitutional:      General: He is not in acute distress.    Appearance: He is well-developed.  HENT:     Head: Normocephalic and atraumatic.     Nose: Nose normal.  Eyes:     General: No scleral icterus.       Right eye: No discharge.        Left eye: No discharge.     Conjunctiva/sclera: Conjunctivae normal.     Pupils: Pupils are equal, round, and reactive to light.   Cardiovascular:     Rate and Rhythm: Normal rate and regular rhythm.     Heart sounds: Normal heart sounds. No murmur. No friction rub. No gallop.   Pulmonary:     Effort: Pulmonary effort is normal. No respiratory distress.     Breath sounds: Normal breath sounds.  Abdominal:     General: Bowel sounds are normal. There is no distension.     Palpations: Abdomen is soft.     Tenderness: There is no abdominal tenderness. There is no guarding.  Musculoskeletal:        General: Normal range of motion.     Cervical back: Normal range of motion and neck supple.  Skin:    General: Skin is warm and dry.     Findings: No rash.  Neurological:     Mental Status: He is alert.     Motor: No abnormal muscle tone.     Coordination: Coordination normal.     Comments: Alert, oriented to self.  Unable to name his sister, situation or time. No gross facial asymmetry noted. Equal grip strength bilaterally.  Strength 5/5 in bilateral lower extremities.     ED Results / Procedures / Treatments   Labs (all labs ordered are listed, but only abnormal results are displayed) Labs Reviewed  COMPREHENSIVE METABOLIC PANEL - Abnormal; Notable for the following components:      Result Value   CO2 13 (*)    Glucose, Bld 167 (*)    BUN 115 (*)    Creatinine, Ser 10.38 (*)    AST 222 (*)    ALT 156 (*)    GFR calc non Af Amer 5 (*)    GFR calc Af Amer 6 (*)    Anion gap 24 (*)    All other components within normal limits  CBC - Abnormal; Notable for the following components:   WBC 11.9 (*)    RBC 5.99 (*)    All other components within normal limits  URINALYSIS, ROUTINE W REFLEX MICROSCOPIC - Abnormal; Notable for the following components:   APPearance HAZY (*)    Glucose, UA 50 (*)    Hgb urine dipstick LARGE (*)    Protein, ur 100 (*)    Bacteria, UA RARE (*)    All other components within normal limits  RAPID URINE DRUG SCREEN, HOSP PERFORMED - Abnormal; Notable for the following components:    Tetrahydrocannabinol POSITIVE (*)    All other components within normal limits  CK - Abnormal; Notable for the following components:   Total CK 2,955 (*)    All other components within normal limits  BLOOD GAS, VENOUS - Abnormal; Notable for the following components:   pCO2, Ven 39.5 (*)    Bicarbonate 17.0 (*)    Acid-base deficit 9.3 (*)    All other components within normal limits  CBG MONITORING, ED - Abnormal; Notable for the following components:   Glucose-Capillary 295 (*)    All other components within normal limits  CBG MONITORING, ED - Abnormal; Notable for the following components:   Glucose-Capillary 161 (*)    All other components within normal limits  CULTURE, BLOOD (SINGLE)  SARS CORONAVIRUS 2 (TAT 6-24 HRS)  ETHANOL  AMMONIA  LACTIC ACID, PLASMA  LACTIC ACID, PLASMA    EKG None  Radiology CT Head Wo Contrast  Result Date: 04/05/2019 CLINICAL DATA:  Altered mental status. EXAM: CT HEAD WITHOUT CONTRAST TECHNIQUE: Contiguous axial images were obtained from the base of the skull through the vertex without intravenous contrast. COMPARISON:  09/08/2007 FINDINGS: Brain: There is no evidence for acute hemorrhage, hydrocephalus, mass lesion, or abnormal extra-axial fluid collection. No definite CT evidence for acute infarction. Vascular: No hyperdense vessel or unexpected calcification. Skull: No evidence for fracture. No worrisome lytic or sclerotic lesion. Sinuses/Orbits: The visualized paranasal sinuses and mastoid air cells are clear. Visualized portions of the globes and intraorbital fat are unremarkable. Other: None. IMPRESSION: 1. Unremarkable CT head.  No acute intracranial abnormality. Electronically Signed   By: Misty Stanley M.D.   On: 04/05/2019 13:12    Procedures .Critical Care Performed by: Delia Heady, PA-C Authorized by: Delia Heady, PA-C   Critical care provider statement:    Critical care time (minutes):  35   Critical care was necessary to treat  or prevent imminent or life-threatening deterioration of the following conditions:  Circulatory failure, renal failure, shock and hepatic failure   Critical care was time spent personally by me on the following activities:  Development of treatment plan with patient or surrogate, discussions with consultants, evaluation of patient's response to treatment, examination of patient, ordering and performing treatments and interventions, ordering and review of laboratory studies, ordering and review of radiographic studies, re-evaluation of patient's condition and review of old charts   I assumed direction of critical care for this patient from another provider in my specialty: no     (including critical care time)  Medications Ordered in ED Medications  sodium chloride flush (NS) 0.9 % injection 3 mL (3 mLs Intravenous Given 04/05/19 1755)  sodium chloride 0.9 % bolus 1,000 mL (1,000 mLs Intravenous New Bag/Given 04/05/19 1857)    ED Course  I have reviewed the triage vital signs and the nursing notes.  Pertinent labs & imaging results that were available during my care of the patient were reviewed by me and considered in my medical decision making (see chart for details).  Clinical Course as of Apr 04 1925  Sun Apr 05, 2019  1857 Dr. Joelyn Oms of nephrology to evaluate in AM.   [HK]    Clinical Course User Index [HK] Delia Heady, PA-C   MDM Rules/Calculators/A&P                      48 year old male with possible history of cirrhosis presents to ED for confusion.  Sister at bedside states that they have noticed confusion since yesterday.  They are unsure of his whereabouts for the past 2 to 3 days for concern that he was at home, in his room without any p.o. intake at all.  Patient states he does not  recall anything about this past week.  They are unsure if he had fallen down but patient denies any specific pain in joints or limbs.  Patient hypertensive here.  Lab work significant for  creatinine of 10, BUN of 115, CO2 of 13, anion gap of 24 and elevated AST and ALT.  Urinalysis with hematuria. CK above 2000. Last Cr in 2019 was 0.9.  Patient given IV fluids. Nephrology to see in AM. Will admit to hospitalist service for acute renal failure.  Final Clinical Impression(s) / ED Diagnoses Final diagnoses:  Acute renal failure, unspecified acute renal failure type ([place])  Altered mental status, unspecified altered mental status type  Transaminitis    Rx / DC Orders ED Discharge Orders    None     Portions of this note were generated with Dragon dictation software. Dictation errors may occur despite best attempts at proofreading.    Delia Heady, PA-C 04/05/19 1927    Sherwood Gambler, MD 04/05/19 [id number]    Sherwood Gambler, MD 04/05/19 [phone]

## 2019-04-05 NOTE — H&P (Signed)
History and Physical    [name] KDX:[id number] DOB: July 01, 1970 DOA: 04/05/2019  PCP: Patient, No Pcp Per  Patient coming from: Home  I have personally briefly reviewed patient's old medical records in [place]  Chief Complaint: Altered mental status  HPI: [name] is a 48 y.o. 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  History is limited from patient and is otherwise supplemented from EDP, chart review, and sister at bedside.  Patient was reportedly in his usual state of health, completely alert and oriented and communicative 5 days ago.  Since then he is only been seen sparingly at home and per sister they thought he was out of the house for most of the time.  He reportedly has not been eating or drinking during that time.  On 04/03/2019 he was not seen at all but was felt to be in his room all day.  On 04/04/2019, he was seen by his sister around 3 PM and was notably confused and "out of it."  He was not able to remember his own birthday or family members.  They thought he was dehydrated and attempted to increase his fluid intake however his confusion has only been worsening.  He was brought to the ED for further evaluation.  Sister states that patient uses recreational drugs, suspected to be marijuana.  She states he does not use any alcohol or other illicit drugs.  He is not currently taking any medications regularly.  He occasionally uses Tylenol or ibuprofen for pain but his sister denies excessive use that she is the one that manages the medications at home.  He has a history of childhood seizures with a reported recurrent seizure episode occurring about 12 years ago.  He has not had any further seizure like activity and is not on antiepileptic medications.  Patient currently denies any pain.  He cannot remember if he has been having any  nausea, vomiting, diarrhea, or difficulty with urination.  ED Course:  Initial vitals showed BP 156/104, pulse 112, RR 16, temp 98.1 Fahrenheit, SPO2 100% on room air.  Labs notable for BUN 115, creatinine 10.38, sodium 135, potassium 4.8, bicarb 13, serum glucose 167, AST 222, ALT 156, alk phos 67, total bilirubin 0.7.  Serum ethanol level is undetectable.  Ammonia 24.  Lactic acid 2.7.  VBG showed pH 7.26, PCO2 39.5, PO2 43.5.  Urinalysis showed negative nitrites, negative leukocytes, 100 protein, negative ketones, 0-5 RBCs, 0-5 WBCs, rare bacteria on microscopy.  UDS was positive for THC.  SARS-CoV-2 test was ordered and pending.  CT head without contrast was negative for acute intracranial abnormality.  Patient was given 1 L normal saline.  EDP discussed the case with on-call nephrology who will see in consultation tomorrow.  The hospitalist service was consulted to admit for further evaluation and management.  Review of Systems:  Full review systems limited due to altered mental status.   Past Medical History:  Diagnosis Date  . Cirrhosis ([place])   . Seizures ([place])     History reviewed. No pertinent surgical history.  Social History:  reports that he has never smoked. He has never used smokeless tobacco. He reports current drug use. Drug: Marijuana. He reports that he does not drink alcohol.  Allergies  Allergen Reactions  . Penicillins Other (See Comments)    Childhood allergy   . Sulfur Hives and Rash  Family History  Problem Relation Age of Onset  . Diabetes Mother   . Diabetes Sister      Prior to Admission medications   Medication Sig Start Date End Date Taking? Authorizing Provider  diphenhydrAMINE (BENADRYL) 25 MG tablet Take 25 mg by mouth 2 (two) times daily as needed for itching or sleep.   Yes [provider]  fexofenadine (ALLEGRA) 180 MG tablet Take 180 mg by mouth daily as needed for allergies or rhinitis.   Yes [provider]    ibuprofen (ADVIL,MOTRIN) 200 MG tablet Take 600-800 mg by mouth every 6 (six) hours as needed for moderate pain.   Yes [provider]  naproxen (NAPROSYN) 375 MG tablet Take 1 tablet (375 mg total) by mouth 2 (two) times daily with a meal. Patient not taking: Reported on 11/20/2017 12/11/15   Frederica Kuster, PA-C  ondansetron (ZOFRAN ODT) 4 MG disintegrating tablet Take 1 tablet (4 mg total) by mouth every 8 (eight) hours as needed for nausea or vomiting. Patient not taking: Reported on 04/05/2019 11/20/17   Kinnie Feil, PA-C  ondansetron (ZOFRAN) 4 MG tablet Take 1 tablet (4 mg total) by mouth every 8 (eight) hours as needed for nausea or vomiting. Patient not taking: Reported on 11/20/2017 12/11/15   Frederica Kuster, PA-C    Physical Exam: Vitals:   04/05/19 1801 04/05/19 1854 04/05/19 1900 04/05/19 1930  BP: (!) 181/115  (!) 176/110 (!) 179/104  Pulse: 85  77 82  Resp: _0 Temp:      TempSrc:      SpO2: 100%  100% 100%  Weight:  65 kg    Height:  _1  (1.753 m)      Constitutional: Resting supine in bed, shivering, NAD, calm, comfortable Eyes: PERRL, lids and conjunctivae normal ENMT: Mucous membranes are moist. Posterior pharynx clear of any exudate or lesions. Neck: normal, supple, no masses. Respiratory: clear to auscultation bilaterally, no wheezing, no crackles. Normal respiratory effort. No accessory muscle use.  Cardiovascular: Regular rate and rhythm, no murmurs / rubs / gallops. No extremity edema. 2+ pedal pulses. Abdomen: no tenderness, no masses palpated. No hepatosplenomegaly. Bowel sounds positive.  Musculoskeletal: no clubbing / cyanosis. No joint deformity upper and lower extremities. Good ROM, no contractures. Normal muscle tone.  Skin: no rashes, lesions, ulcers. No induration Neurologic: CN 2-12 grossly intact. Sensation intact, Strength 5/5 in all 4.  Psychiatric: Alert and oriented to self and knows sister's name at bedside.  Does not  know where he is or the current year.   Labs on Admission: I have personally reviewed following labs and imaging studies  CBC: Recent Labs  Lab 04/05/19 1532  WBC 11.9*  HGB 15.7  HCT 48.3  MCV 80.6  PLT 695   Basic Metabolic Panel: Recent Labs  Lab 04/05/19 1532  NA 135  K 4.8  CL 98  CO2 13*  GLUCOSE 167*  BUN 115*  CREATININE 10.38*  CALCIUM 8.9   GFR: Estimated Creatinine Clearance: 7.9 mL/min (A) (by C-G formula based on SCr of 10.38 mg/dL (H)). Liver Function Tests: Recent Labs  Lab 04/05/19 1532  AST 222*  ALT 156*  ALKPHOS 67  BILITOT 0.7  PROT 7.7  ALBUMIN 4.1   No results for input(s): LIPASE, AMYLASE in the last 168 hours. Recent Labs  Lab 04/05/19 1756  AMMONIA 24   Coagulation Profile: No results for input(s): INR, PROTIME in the last 168 hours. Cardiac Enzymes:  Recent Labs  Lab 04/05/19 1532  CKTOTAL 2,955*   BNP (last 3 results) No results for input(s): PROBNP in the last 8760 hours. HbA1C: No results for input(s): HGBA1C in the last 72 hours. CBG: Recent Labs  Lab 04/05/19 1233 04/05/19 1756  GLUCAP 295* 161*   Lipid Profile: No results for input(s): CHOL, HDL, LDLCALC, TRIG, CHOLHDL, LDLDIRECT in the last 72 hours. Thyroid Function Tests: No results for input(s): TSH, T4TOTAL, FREET4, T3FREE, THYROIDAB in the last 72 hours. Anemia Panel: No results for input(s): VITAMINB12, FOLATE, FERRITIN, TIBC, IRON, RETICCTPCT in the last 72 hours. Urine analysis:    Component Value Date/Time   COLORURINE YELLOW 04/05/2019 1242   APPEARANCEUR HAZY (A) 04/05/2019 1242   LABSPEC 1.014 04/05/2019 1242   PHURINE 5.0 04/05/2019 1242   GLUCOSEU 50 (A) 04/05/2019 1242   HGBUR LARGE (A) 04/05/2019 1242   BILIRUBINUR NEGATIVE 04/05/2019 1242   KETONESUR NEGATIVE 04/05/2019 1242   PROTEINUR 100 (A) 04/05/2019 1242   UROBILINOGEN 0.2 09/08/2007 1029   NITRITE NEGATIVE 04/05/2019 1242   LEUKOCYTESUR NEGATIVE 04/05/2019 1242     Radiological Exams on Admission: CT Head Wo Contrast  Result Date: 04/05/2019 CLINICAL DATA:  Altered mental status. EXAM: CT HEAD WITHOUT CONTRAST TECHNIQUE: Contiguous axial images were obtained from the base of the skull through the vertex without intravenous contrast. COMPARISON:  09/08/2007 FINDINGS: Brain: There is no evidence for acute hemorrhage, hydrocephalus, mass lesion, or abnormal extra-axial fluid collection. No definite CT evidence for acute infarction. Vascular: No hyperdense vessel or unexpected calcification. Skull: No evidence for fracture. No worrisome lytic or sclerotic lesion. Sinuses/Orbits: The visualized paranasal sinuses and mastoid air cells are clear. Visualized portions of the globes and intraorbital fat are unremarkable. Other: None. IMPRESSION: 1. Unremarkable CT head.  No acute intracranial abnormality. Electronically Signed   By: Misty Stanley M.D.   On: 04/05/2019 13:12    EKG: Not performed.  Assessment/Plan Principal Problem:   Acute renal failure (ARF) (HCC) Active Problems:   Acute metabolic encephalopathy  [name] is a 48 y.o. male with medical history significant for remote history of seizures not currently on antiepileptics and marijuana use who is admitted with acute renal failure.  Acute renal failure: Without obvious etiology.  Currently no emergent indication for dialysis.  Nephrology are aware and will see in consultation tomorrow.  Will admit to [facility] in case he will need dialysis. -Continue IV fluid hydration with LR overnight -Check urine sodium, creatinine -Check abdominal ultrasound -Bladder scan as needed, monitor strict I/O's -Repeat labs in a.m. -Avoid NSAIDs  Acute metabolic encephalopathy: Likely related to uremia from acute renal failure.  Continue supportive care as above.  Elevated LFTs: Mildly elevated AST/ALT.  Questionable history of documented cirrhosis in the past, sister is unaware if this is  accurate. -Check abdominal ultrasound -Repeat labs in a.m.  Lactic acidosis: Recheck labs after IV fluid hydration.  DVT prophylaxis: Subcutaneous heparin Code Status: Full code, confirmed with patient and sister at bedside Family Communication: Discussed with sister at bedside Disposition Plan: Admit to [facility] called: Nephrology Admission status: Inpatient, patient will likely require greater than 2 midnight length stay for management of acute renal failure as he is high risk for decompensation and may require hemodialysis.  Zada Finders MD [facility]  If 7PM-7AM, please contact night-coverage [email or web address]  04/05/2019, 7:54 PM

## 2019-04-05 NOTE — ED Triage Notes (Signed)
Pt arrives with sister. Per sister, since Thursday, pt has gotten more and more confused. Pt does not know year, place, situation. Pt states that he does not have a sense of taste and does not want to eat. Sister gave him 3 cups of OJ, thinking he was dehydrated, but pt still remains confused. Prior to this, pt was A&Ox4. Pt states that he has a headache of the left base of his head.

## 2019-04-05 NOTE — ED Notes (Addendum)
Date and time results received: 04/05/19 1928 (use smartphrase ".now" to insert current time)  Test:lactic acid Critical Value: 2.7  Name of Provider Notified: Posey Pronto MD  Orders Received? Or Actions Taken?:waiting on orders

## 2019-04-06 LAB — COMPREHENSIVE METABOLIC PANEL
ALT: 102 U/L — ABNORMAL HIGH (ref 0–44)
AST: 126 U/L — ABNORMAL HIGH (ref 15–41)
Albumin: 2.9 g/dL — ABNORMAL LOW (ref 3.5–5.0)
Alkaline Phosphatase: 50 U/L (ref 38–126)
Anion gap: 15 (ref 5–15)
BUN: 159 mg/dL — ABNORMAL HIGH (ref 6–20)
CO2: 19 mmol/L — ABNORMAL LOW (ref 22–32)
Calcium: 8.6 mg/dL — ABNORMAL LOW (ref 8.9–10.3)
Chloride: 101 mmol/L (ref 98–111)
Creatinine, Ser: 11.58 mg/dL — ABNORMAL HIGH (ref 0.61–1.24)
GFR calc Af Amer: 5 mL/min — ABNORMAL LOW (ref >60)
GFR calc non Af Amer: 5 mL/min — ABNORMAL LOW (ref >60)
Glucose, Bld: 115 mg/dL — ABNORMAL HIGH (ref 70–99)
Potassium: 5.2 mmol/L — ABNORMAL HIGH (ref 3.5–5.1)
Sodium: 135 mmol/L (ref 135–145)
Total Bilirubin: 0.9 mg/dL (ref 0.3–1.2)
Total Protein: 5.4 g/dL — ABNORMAL LOW (ref 6.5–8.1)

## 2019-04-06 LAB — CBC
HCT: 40.6 % (ref 39.0–52.0)
Hemoglobin: 13.6 g/dL (ref 13.0–17.0)
MCH: 26.5 pg (ref 26.0–34.0)
MCHC: 33.5 g/dL (ref 30.0–36.0)
MCV: 79.1 fL — ABNORMAL LOW (ref 80.0–100.0)
Platelets: 244 10*3/uL (ref 150–400)
RBC: 5.13 MIL/uL (ref 4.22–5.81)
RDW: 13.2 % (ref 11.5–15.5)
WBC: 10.4 10*3/uL (ref 4.0–10.5)
nRBC: 0 % (ref 0.0–0.2)

## 2019-04-06 LAB — CK
Total CK: 14903 U/L — ABNORMAL HIGH (ref 49–397)
Total CK: 7482 U/L — ABNORMAL HIGH (ref 49–397)

## 2019-04-06 LAB — HEMOGLOBIN A1C
Hgb A1c MFr Bld: 6.9 % — ABNORMAL HIGH (ref 4.8–5.6)
Mean Plasma Glucose: 151.33 mg/dL

## 2019-04-06 LAB — RENAL FUNCTION PANEL
Albumin: 3.3 g/dL — ABNORMAL LOW (ref 3.5–5.0)
Anion gap: 16 — ABNORMAL HIGH (ref 5–15)
BUN: 148 mg/dL — ABNORMAL HIGH (ref 6–20)
CO2: 15 mmol/L — ABNORMAL LOW (ref 22–32)
Calcium: 8.5 mg/dL — ABNORMAL LOW (ref 8.9–10.3)
Chloride: 105 mmol/L (ref 98–111)
Creatinine, Ser: 10.42 mg/dL — ABNORMAL HIGH (ref 0.61–1.24)
GFR calc Af Amer: 6 mL/min — ABNORMAL LOW (ref >60)
GFR calc non Af Amer: 5 mL/min — ABNORMAL LOW (ref >60)
Glucose, Bld: 147 mg/dL — ABNORMAL HIGH (ref 70–99)
Phosphorus: 6.2 mg/dL — ABNORMAL HIGH (ref 2.5–4.6)
Potassium: 5.4 mmol/L — ABNORMAL HIGH (ref 3.5–5.1)
Sodium: 136 mmol/L (ref 135–145)

## 2019-04-06 LAB — HEPATITIS PANEL, ACUTE
HCV Ab: NONREACTIVE
Hep A IgM: NONREACTIVE
Hep B C IgM: NONREACTIVE
Hepatitis B Surface Ag: NONREACTIVE

## 2019-04-06 LAB — PROTIME-INR
INR: 1.1 (ref 0.8–1.2)
Prothrombin Time: 13.6 seconds (ref 11.4–15.2)

## 2019-04-06 LAB — HEPATIC FUNCTION PANEL
ALT: 114 U/L — ABNORMAL HIGH (ref 0–44)
AST: 163 U/L — ABNORMAL HIGH (ref 15–41)
Albumin: 3.3 g/dL — ABNORMAL LOW (ref 3.5–5.0)
Alkaline Phosphatase: 51 U/L (ref 38–126)
Bilirubin, Direct: 0.1 mg/dL (ref 0.0–0.2)
Indirect Bilirubin: 0.7 mg/dL (ref 0.3–0.9)
Total Bilirubin: 0.8 mg/dL (ref 0.3–1.2)
Total Protein: 5.7 g/dL — ABNORMAL LOW (ref 6.5–8.1)

## 2019-04-06 LAB — APTT: aPTT: 28 seconds (ref 24–36)

## 2019-04-06 LAB — URIC ACID: Uric Acid, Serum: 19.7 mg/dL — ABNORMAL HIGH (ref 3.7–8.6)

## 2019-04-06 LAB — HIV ANTIBODY (ROUTINE TESTING W REFLEX): HIV Screen 4th Generation wRfx: NONREACTIVE

## 2019-04-06 LAB — TYPE AND SCREEN
ABO/RH(D): A POS
Antibody Screen: NEGATIVE

## 2019-04-06 LAB — ABO/RH: ABO/RH(D): A POS

## 2019-04-06 LAB — GAMMA GT: GGT: 18 U/L (ref 7–50)

## 2019-04-06 LAB — SARS CORONAVIRUS 2 (TAT 6-24 HRS): SARS Coronavirus 2: NEGATIVE

## 2019-04-06 MED ORDER — SODIUM BICARBONATE-DEXTROSE 150-5 MEQ/L-% IV SOLN
150.0000 meq | INTRAVENOUS | Status: AC
  Administered 2019-04-06 – 2019-04-07 (×2): 150 meq via INTRAVENOUS
  Filled 2019-04-06 (×4): qty 1000

## 2019-04-06 MED ORDER — SODIUM CHLORIDE 0.9 % IV BOLUS
1000.0000 mL | Freq: Once | INTRAVENOUS | Status: AC
  Administered 2019-04-06: 1000 mL via INTRAVENOUS

## 2019-04-06 MED ORDER — SODIUM BICARBONATE 8.4 % IV SOLN
INTRAVENOUS | Status: DC
  Filled 2019-04-06 (×2): qty 150

## 2019-04-06 MED ORDER — SODIUM ZIRCONIUM CYCLOSILICATE 5 G PO PACK
5.0000 g | PACK | Freq: Once | ORAL | Status: AC
  Administered 2019-04-06: 5 g via ORAL
  Filled 2019-04-06: qty 1

## 2019-04-06 NOTE — ED Notes (Addendum)
Lovena Le, RN and writer bladder scanned pt due to pt stating he does not need to void, and has not needed to void throughout the shift. Bladder scan showed 422mL. Pt encouraged to use the urinal to void, despite pt stating he still does not need to void. Pt assisted to stand up at bedside and pt was able to void using the urinal. Pt voided 472mL. Post void bladder scan shows 35mL. Pt assisted back into the bed and repositioned. Pt denies needing anything at this time, and has call bell within reach.

## 2019-04-06 NOTE — ED Notes (Signed)
Patient is alert sitting upright in bed. Patient appears comfortable. Patient is able to state his name, birth date, and knows he is at the hospital. Patient states he is confused and does not know why he is here or what is going on.  This RN updated patient on plan of care and patient verbalized understanding of staying the night and going to [place] for possible dialysis.   Patient gave this RN to update family at home and his two sisters who are in patients chart as contact information.

## 2019-04-06 NOTE — Progress Notes (Signed)
PROGRESS NOTE    [name]  [id number] DOB: [birth year] DOA: 04/05/2019 PCP: Patient, No Pcp Per      Brief Narrative:  [name] is a 48 y.o. M with hx seizures not currently on AEDs who presents with few days progressive confusion, malaise.  See H&P for detailed timeline, but in brief, patient lives with family, who observed him to become progressively more sluggish, confused and "out of it" over about 5 days.  Was not eating or drinking during that time, and eventually was obviously confused, so they brought him to the ER.  In the ER, Cr 10.38, BUN 115, bicarb 13, VBG with pH 7.26.  UA with bland sediment.  UDS only positive for THC.  Case discussed with Nephrology and patient admitted to [facility] and started on fluids.         Assessment & Plan:  Acute renal failure, suspect ATN due to non-traumatic rhabdomyolysis Hyperkalemia Baseline creatinine 0.9 in 2019.  UA shows large heme dipstick, 0-5 RBC on microscopy.   FeNA >2%. On fluids overnight, no improvement in Cr.  K worsening.   -Close monitoring K -Give Lokelma now -Continue IV fluids -Check urate -Consult nephrology, aprpeciate cares    Atraumatic rhabdomyolysis CK 14K.  Now with established pigment-induced renal injury. This is an impressive rhabdomyolysis, without obvious cause (UDS negative, no recent excessive physical exertion) will need further evaluation. -Continue IV fluids -Trend CK  Transaminitis Long standing from 2019.  Viral hep serologies negative at that time.  Possibly muscular given rhabdo.  LFTs trending down  Ammonia and albumin normal, suspect synthetic function intact.  US shows fatty liver infiltration.  Imaging of liver from one year ago was normal. -Trend LFTs -Check ggt     -Check hepatitis serologies again -Check INR for synthetic function   Hyperphosphatemia  In setting of rhabdo -Monitor  High blood pressure -PRN labetalol  For severe range pressures  Acute metabolic  encephalopathy Suspect this is from uremia, as is BUN is now over 140.  5 possibilities are that there is a component of depression involved.     MDM and disposition: The below labs and imaging reports were reviewed and summarized above.  Medication management as above.  The patient was admitted with acute renal failure and rhabdomyolysis.           DVT prophylaxis: Heparin Code Status: FULL Family Communication:     Consultants:   Nephrology  Procedures:   12/27 CT head -- unremarkable  12/27 US abdomen -- fatty liver  Antimicrobials:       Subjective: He is still confused, still weak, still decreased appetite.  Patient denies any recent symptoms of headache, vision changes, vertigo or dizziness, seizures, loss of consciousness.  Any recent weight loss, swollen glands, changes to hair skin or nails, rashes, chest pain, palpitations, trouble breathing, orthopnea, swelling, abdominal pain, nausea, vomiting, diarrhea.  No joint pains.  Objective: Vitals:   04/06/19 0600 04/06/19 0630 04/06/19 0700 04/06/19 0730  BP: (!) 167/86 (!) 153/95 (!) 148/87 (!) 162/87  Pulse: 60 61 (!) 59 67  Resp: 13 15 12 13   Temp:      TempSrc:      SpO2: 100% 100% 99% 100%  Weight:      Height:        Intake/Output Summary (Last 24 hours) at 04/06/2019 0924 Last data filed at 04/06/2019 0445 Gross per 24 hour  Intake 1000 ml  Output 0 ml  Net 1000 ml  Filed Weights   04/05/19 1854  Weight: 65 kg    Examination: General appearance: Thin adult male, alert and in no acute distress.   HEENT: Anicteric, conjunctiva pink, lids and lashes normal. No nasal deformity, discharge, epistaxis.  Lips moist, dentition normal, oropharynx moist, no oral lesions, hearing normal.   Skin: Warm and dry.  No jaundice.  No suspicious rashes or lesions on the face, neck, back, abdomen, torso, arms, or legs. Cardiac: RRR, nl S1-S2, no murmurs appreciated.  Capillary refill is brisk.  JVP  normal.  No LE edema.  Radial pulses 2+ and symmetric. Respiratory: Normal respiratory rate and rhythm.  CTAB without rales or wheezes. Abdomen: Abdomen soft.  No TTP or guarding. No ascites, distension, hepatosplenomegaly.   MSK: No deformities or effusions of the large joints of the upper or lower extremities bilaterally.  No sclerodactyly, clubbing.  Normal muscle bulk and tone. Neuro: Awake and alert.  EOMI, moves all extremities with normal strength and coordination.  Cranial nerves III through XII intact.  No tremor.  Mild nystagmus bilaterally.  Speech fluent.    Psych: Sensorium intact and responding to questions, attention normal. Affect flat.  Judgment and insight appear impaired.  Oriented to self, not oriented to year or place, but later in the conversation states "I do not know how to get to the hospital"    Data Reviewed: I have personally reviewed following labs and imaging studies:  CBC: Recent Labs  Lab 04/05/19 1532 04/06/19 0426  WBC 11.9* 10.4  HGB 15.7 13.6  HCT 48.3 40.6  MCV 80.6 79.1*  PLT 307 [id number]   Basic Metabolic Panel: Recent Labs  Lab 04/05/19 1532 04/06/19 0426  NA 135 136  K 4.8 5.4*  CL 98 105  CO2 13* 15*  GLUCOSE 167* 147*  BUN 115* 148*  CREATININE 10.38* 10.42*  CALCIUM 8.9 8.5*  PHOS  --  6.2*   GFR: Estimated Creatinine Clearance: 7.9 mL/min (A) (by C-G formula based on SCr of 10.42 mg/dL (H)). Liver Function Tests: Recent Labs  Lab 04/05/19 1532 04/06/19 0426  AST 222* 163*  ALT 156* 114*  ALKPHOS 67 51  BILITOT 0.7 0.8  PROT 7.7 5.7*  ALBUMIN 4.1 3.3*  3.3*   No results for input(s): LIPASE, AMYLASE in the last 168 hours. Recent Labs  Lab 04/05/19 1756  AMMONIA 24   Coagulation Profile: No results for input(s): INR, PROTIME in the last 168 hours. Cardiac Enzymes: Recent Labs  Lab 04/05/19 1532 04/06/19 0426  CKTOTAL 2,955* 14,903*   BNP (last 3 results) No results for input(s): PROBNP in the last 8760  hours. HbA1C: Recent Labs    04/06/19 0126  HGBA1C 6.9*   CBG: Recent Labs  Lab 04/05/19 1233 04/05/19 1756  GLUCAP 295* 161*   Lipid Profile: No results for input(s): CHOL, HDL, LDLCALC, TRIG, CHOLHDL, LDLDIRECT in the last 72 hours. Thyroid Function Tests: No results for input(s): TSH, T4TOTAL, FREET4, T3FREE, THYROIDAB in the last 72 hours. Anemia Panel: No results for input(s): VITAMINB12, FOLATE, FERRITIN, TIBC, IRON, RETICCTPCT in the last 72 hours. Urine analysis:    Component Value Date/Time   COLORURINE YELLOW 04/05/2019 1242   APPEARANCEUR HAZY (A) 04/05/2019 1242   LABSPEC 1.014 04/05/2019 1242   PHURINE 5.0 04/05/2019 1242   GLUCOSEU 50 (A) 04/05/2019 1242   HGBUR LARGE (A) 04/05/2019 1242   BILIRUBINUR NEGATIVE 04/05/2019 1242   KETONESUR NEGATIVE 04/05/2019 1242   PROTEINUR 100 (A) 04/05/2019 1242   UROBILINOGEN 0.2  09/08/2007 1029   NITRITE NEGATIVE 04/05/2019 1242   LEUKOCYTESUR NEGATIVE 04/05/2019 1242   Sepsis Labs: @LABRCNTIP (procalcitonin:4,lacticacidven:4)  ) Recent Results (from the past 240 hour(s))  SARS CORONAVIRUS 2 (TAT 6-24 HRS) Nasopharyngeal Nasopharyngeal Swab     Status: None   Collection Time: 04/05/19  9:39 PM   Specimen: Nasopharyngeal Swab  Result Value Ref Range Status   SARS Coronavirus 2 NEGATIVE NEGATIVE Final    Comment: (NOTE) SARS-CoV-2 target nucleic acids are NOT DETECTED. The SARS-CoV-2 RNA is generally detectable in upper and lower respiratory specimens during the acute phase of infection. Negative results do not preclude SARS-CoV-2 infection, do not rule out co-infections with other pathogens, and should not be used as the sole basis for treatment or other patient management decisions. Negative results must be combined with clinical observations, patient history, and epidemiological information. The expected result is Negative. Fact Sheet for Patients: SugarRoll.be Fact Sheet for  Healthcare Providers: https://www.woods-mathews.com/ This test is not yet approved or cleared by the [place] FDA and  has been authorized for detection and/or diagnosis of SARS-CoV-2 by FDA under an Emergency Use Authorization (EUA). This EUA will remain  in effect (meaning this test can be used) for the duration of the COVID-19 declaration under Section 56 4(b)(1) of the Act, 21 U.S.C. section 360bbb-3(b)(1), unless the authorization is terminated or revoked sooner. Performed at [facility], [place] [address] [place], [place] [address]          Radiology Studies: CT Head Wo Contrast  Result Date: 04/05/2019 CLINICAL DATA:  Altered mental status. EXAM: CT HEAD WITHOUT CONTRAST TECHNIQUE: Contiguous axial images were obtained from the base of the skull through the vertex without intravenous contrast. COMPARISON:  09/08/2007 FINDINGS: Brain: There is no evidence for acute hemorrhage, hydrocephalus, mass lesion, or abnormal extra-axial fluid collection. No definite CT evidence for acute infarction. Vascular: No hyperdense vessel or unexpected calcification. Skull: No evidence for fracture. No worrisome lytic or sclerotic lesion. Sinuses/Orbits: The visualized paranasal sinuses and mastoid air cells are clear. Visualized portions of the globes and intraorbital fat are unremarkable. Other: None. IMPRESSION: 1. Unremarkable CT head.  No acute intracranial abnormality. Electronically Signed   By: Misty Stanley M.D.   On: 04/05/2019 13:12   US Abdomen Complete  Result Date: 04/05/2019 CLINICAL DATA:  48 year old male with acute renal failure. EXAM: ABDOMEN ULTRASOUND COMPLETE COMPARISON:  CT of the abdomen pelvis dated 11/20/2017. FINDINGS: Gallbladder: No gallstones or wall thickening visualized. No sonographic Murphy sign noted by sonographer. Common bile duct: Diameter: 5 mm Liver: There is diffuse increased liver echogenicity most commonly seen in the setting of fatty  infiltration. Superimposed inflammation or fibrosis is not excluded. Clinical correlation is recommended. A 1 cm hypoechoic focus within the liver is not characterized but may represent an area of fat sparing or correspond to the hypoechoic structure seen on the CT of 11/20/2017. Portal vein is patent on color Doppler imaging with normal direction of blood flow towards the liver. IVC: No abnormality visualized. Pancreas: Visualized portion unremarkable. Spleen: Size and appearance within normal limits. Right Kidney: Length: 12 cm. Normal echogenicity. No hydronephrosis or shadowing stone. Left Kidney: Length: 12 cm. Normal echogenicity. No hydronephrosis or shadowing stone. Abdominal aorta: No aneurysm visualized. Other findings: None. IMPRESSION: 1. Fatty liver. 2. No gallstone. Electronically Signed   By: Anner Crete M.D.   On: 04/05/2019 20:54        Scheduled Meds: . heparin  5,000 Units Subcutaneous Q8H  Continuous Infusions: . lactated ringers 125 mL/hr at 04/06/19 0542     LOS: 1 day    Time spent: 35 minutes    Edwin Dada, MD [facility] 04/06/2019, 9:24 AM     Please page though Foard or Epic secure chat:  For [employer], Adult nurse

## 2019-04-06 NOTE — Progress Notes (Signed)
NEW ADMISSION NOTE New Admission Note:     Arrival Method: Patient arrived from [facility] ED. Mental Orientation:   Alert and oriented x 4. Telemetry:  N/A Assessment: Completed Skin:  Warm, dry and intact. IV:  R FA SL Pain:  Denies any pain. Tubes: N/A Safety Measures: Safety Fall Prevention Plan has been given, discussed and signed Admission: Completed [facility] Orientation: Patient has been orientated to the room, unit and staff.   Orders have been reviewed and implemented. Will continue to monitor the patient. Call light has been placed within reach and bed alarm has been activated.   Amaryllis Dyke, RN

## 2019-04-06 NOTE — Consult Note (Signed)
[name] Admit Date: 04/05/2019 04/06/2019 [name] Requesting Physician:  Dalton Books MD  Reason for Consult:  AKI HPI:  20F presented yesterday to the [facility] emergency room with a complaint of several days of progressive malaise and confusion.  His past history includes seizure disorder currently not on antiepileptics (it sounds like he self discontinued) and questionable history of cirrhosis (imaging with fatty liver, nothing more confirmatory, not sure present).  He is alone at the time of my evaluation.  He states that he has no memory of the past weeks to months.  He can answer concrete questions such as do you take medications (no), you use marijuana or alcohol (uses marijuana remotely, UDS positive for THC at presentation), and is currently unemployed.  The details leading up to his presentation he is unable to provide.  When he was in the emergency room his labs are notable for severe renal failure with a presenting creatinine of 10.38 and BUN of 115.  His bicarbonate was 13 with an anion gap of 24.  His K was 4.8.  UA had large hemoglobin without hematuria and his CK was elevated at 2955 and has trended up to a more recent value of 14,903.  Creatinine earlier this morning was 10.4 with a K of 5.4 and a bicarbonate of 15.  This phosphorus is 6.2, calcium 8.5, uric acid 19.7.  He has an AST greater than ALT of 160 314 respectively.  Abdominal ultrasound yesterday with normal-sized kidneys without evidence of obstruction or acute structural issues.  No ascites present.  Liver was described as fatty in appearance.  He denies use of NSAIDs.  He denies use of other medications.  He denies seizures but, as above, cannot remember what happened.  The H&P was reviewed where the family was present, but also seems unclear what was happening with him at this time.  He currently denies significant muscle pain other than some mild tenderness in his abdomen.  Thus far he has received 1 L of  saline but currently is not receiving any more fluids. ? Admission mentioned using LR, not running currently.     Creatinine, Ser  Date Value  04/06/2019 10.42 mg/dL (H)  04/05/2019 10.38 mg/dL (H)  11/20/2017 0.91 mg/dL  12/11/2015 0.89 mg/dL  09/08/2007 0.82  07/15/2007 0.81 mg/dL  04/06/2007 0.93  ]  ROS NSAIDS: As able he denies use IV Contrast no exposure TMP/SMX no exposure Hypotension not present Balance of 12 systems is negative w/ exceptions as above  PMH  Past Medical History:  Diagnosis Date  . Cirrhosis ([place])   . Seizures ([place])    [place] History reviewed. No pertinent surgical history. FH  Family History  Problem Relation Age of Onset  . Diabetes Mother   . Diabetes Sister    SH  reports that he has never smoked. He has never used smokeless tobacco. He reports current drug use. Drug: Marijuana. He reports that he does not drink alcohol. Allergies  Allergies  Allergen Reactions  . Penicillins Other (See Comments)    Childhood allergy   . Sulfur Hives and Rash   Home medications Prior to Admission medications   Medication Sig Start Date End Date Taking? Authorizing Provider  diphenhydrAMINE (BENADRYL) 25 MG tablet Take 25 mg by mouth 2 (two) times daily as needed for itching or sleep.   Yes [provider]  fexofenadine (ALLEGRA) 180 MG tablet Take 180 mg by mouth daily as needed for allergies or rhinitis.   Yes  [provider]  ondansetron (ZOFRAN ODT) 4 MG disintegrating tablet Take 1 tablet (4 mg total) by mouth every 8 (eight) hours as needed for nausea or vomiting. Patient not taking: Reported on 04/05/2019 11/20/17   Kinnie Feil, PA-C  ondansetron (ZOFRAN) 4 MG tablet Take 1 tablet (4 mg total) by mouth every 8 (eight) hours as needed for nausea or vomiting. Patient not taking: Reported on 11/20/2017 12/11/15   Frederica Kuster, PA-C    Current Medications Scheduled Meds: . heparin  5,000 Units Subcutaneous Q8H   Continuous  Infusions: .  sodium bicarbonate  infusion 1000 mL     PRN Meds:.acetaminophen **OR** acetaminophen, labetalol, ondansetron **OR** ondansetron (ZOFRAN) IV  CBC Recent Labs  Lab 04/05/19 1532 04/06/19 0426  WBC 11.9* 10.4  HGB 15.7 13.6  HCT 48.3 40.6  MCV 80.6 79.1*  PLT 307 [id number]   Basic Metabolic Panel Recent Labs  Lab 04/05/19 1532 04/06/19 0426  NA 135 136  K 4.8 5.4*  CL 98 105  CO2 13* 15*  GLUCOSE 167* 147*  BUN 115* 148*  CREATININE 10.38* 10.42*  CALCIUM 8.9 8.5*  PHOS  --  6.2*    Physical Exam  Blood pressure (!) 163/85, pulse 66, temperature 98.4 F (36.9 C), temperature source Oral, resp. rate 18, height 5\' 8"  (1.727 m), weight 66 kg, SpO2 99 %. GEN: NAD ENT: NCAT EYES: EOMI CV: Regular, normal S1 and S2 PULM: Clear bilaterally ABD: Soft, nontender, no HSM, NABS SKIN: No rashes or lesions EXT: No peripheral edema  Assessment 74M presenting with severe AKI, metabolic acidosis, hyperkalemia, hyperphosphatemia, and rhabdomyolysis.  Past history of seizures and questionable history of cirrhosis which I doubt.  He has little recall of recent events.  Because of rhabdomyolysis is not obvious, I wonder if he had a prolonged seizure which was unwitnessed leaving him with his amnestic and metabolic issues?  1. Severe AKI secondary to rhabdomyolysis 2. Mild hyperkalemia 3. Metabolic acidosis with anion gap of 16 4. Rhabdomyolysis, etiology unclear, see above 5. Seizure disorder currently not on antiepileptics 6. Confusional state, amnesia  Plan 1. Repeat CMP and CK now 2. Aggressive hydration, start D5 with 150 mEq of sodium bicarbonate at 200 mils per hour 3. We will also bolused 1 L normal saline  4. Very possible will req RRT if doesn't quickly resolve 5. Daily weights, Daily Renal Panel, Strict I/Os, Avoid nephrotoxins (NSAIDs, judicious IV Contrast)  6. Consider neurology involvement with question of could he have had a recent seizure?  [name]  [id number] pgr 04/06/2019, 3:59 PM

## 2019-04-07 ENCOUNTER — Inpatient Hospital Stay (HOSPITAL_COMMUNITY)
Admit: 2019-04-07 | Discharge: 2019-04-07 | Disposition: A | Discharge status: Home / Self Care | Payer: Medicaid Other | Attending: Internal Medicine | Admitting: Internal Medicine

## 2019-04-07 ENCOUNTER — Inpatient Hospital Stay (HOSPITAL_COMMUNITY): Payer: Medicaid Other

## 2019-04-07 DIAGNOSIS — R4182 Altered mental status, unspecified: Secondary | ICD-10-CM

## 2019-04-07 DIAGNOSIS — F1210 Cannabis abuse, uncomplicated: Secondary | ICD-10-CM

## 2019-04-07 DIAGNOSIS — R7401 Elevation of levels of liver transaminase levels: Secondary | ICD-10-CM

## 2019-04-07 LAB — BASIC METABOLIC PANEL
Anion gap: 13 (ref 5–15)
BUN: 145 mg/dL — ABNORMAL HIGH (ref 6–20)
CO2: 30 mmol/L (ref 22–32)
Calcium: 8 mg/dL — ABNORMAL LOW (ref 8.9–10.3)
Chloride: 96 mmol/L — ABNORMAL LOW (ref 98–111)
Creatinine, Ser: 10.87 mg/dL — ABNORMAL HIGH (ref 0.61–1.24)
GFR calc Af Amer: 6 mL/min — ABNORMAL LOW (ref >60)
GFR calc non Af Amer: 5 mL/min — ABNORMAL LOW (ref >60)
Glucose, Bld: 122 mg/dL — ABNORMAL HIGH (ref 70–99)
Potassium: 3.6 mmol/L (ref 3.5–5.1)
Sodium: 139 mmol/L (ref 135–145)

## 2019-04-07 LAB — HEPATIC FUNCTION PANEL
ALT: 75 U/L — ABNORMAL HIGH (ref 0–44)
AST: 89 U/L — ABNORMAL HIGH (ref 15–41)
Albumin: 2.4 g/dL — ABNORMAL LOW (ref 3.5–5.0)
Alkaline Phosphatase: 41 U/L (ref 38–126)
Bilirubin, Direct: <0.1 mg/dL (ref 0.0–0.2)
Total Bilirubin: 0.7 mg/dL (ref 0.3–1.2)
Total Protein: 4.6 g/dL — ABNORMAL LOW (ref 6.5–8.1)

## 2019-04-07 LAB — RENAL FUNCTION PANEL
Albumin: 2.4 g/dL — ABNORMAL LOW (ref 3.5–5.0)
Anion gap: 15 (ref 5–15)
BUN: 146 mg/dL — ABNORMAL HIGH (ref 6–20)
CO2: 24 mmol/L (ref 22–32)
Calcium: 7.9 mg/dL — ABNORMAL LOW (ref 8.9–10.3)
Chloride: 100 mmol/L (ref 98–111)
Creatinine, Ser: 10.5 mg/dL — ABNORMAL HIGH (ref 0.61–1.24)
GFR calc Af Amer: 6 mL/min — ABNORMAL LOW (ref >60)
GFR calc non Af Amer: 5 mL/min — ABNORMAL LOW (ref >60)
Glucose, Bld: 183 mg/dL — ABNORMAL HIGH (ref 70–99)
Phosphorus: 5.4 mg/dL — ABNORMAL HIGH (ref 2.5–4.6)
Potassium: 3.9 mmol/L (ref 3.5–5.1)
Sodium: 139 mmol/L (ref 135–145)

## 2019-04-07 LAB — URIC ACID: Uric Acid, Serum: 17.1 mg/dL — ABNORMAL HIGH (ref 3.7–8.6)

## 2019-04-07 LAB — CK: Total CK: 5716 U/L — ABNORMAL HIGH (ref 49–397)

## 2019-04-07 MED ORDER — DEXTROSE 5 % IV SOLN
5.0000 mg/kg | INTRAVENOUS | Status: DC
  Administered 2019-04-08 – 2019-04-09 (×2): 335 mg via INTRAVENOUS
  Filled 2019-04-07 (×4): qty 6.7

## 2019-04-07 MED ORDER — SODIUM BICARBONATE 8.4 % IV SOLN
INTRAVENOUS | Status: DC
  Filled 2019-04-07 (×3): qty 850

## 2019-04-07 MED ORDER — SODIUM CHLORIDE 0.9 % IV SOLN
50.0000 mg | Freq: Two times a day (BID) | INTRAVENOUS | Status: DC
  Administered 2019-04-07 – 2019-04-09 (×6): 50 mg via INTRAVENOUS
  Filled 2019-04-07 (×10): qty 5

## 2019-04-07 MED ORDER — LORAZEPAM 2 MG/ML IJ SOLN
2.0000 mg | INTRAMUSCULAR | Status: DC | PRN
  Administered 2019-04-10 – 2019-04-11 (×2): 2 mg via INTRAVENOUS
  Filled 2019-04-07 (×3): qty 1

## 2019-04-07 MED ORDER — SODIUM BICARBONATE-DEXTROSE 150-5 MEQ/L-% IV SOLN: 150.0000 meq | INTRAVENOUS | Status: DC

## 2019-04-07 MED ORDER — LACTATED RINGERS IV SOLN: INTRAVENOUS | Status: DC

## 2019-04-07 MED ORDER — DEXTROSE 5 % IV SOLN
65.0000 mg | INTRAVENOUS | Status: DC
  Administered 2019-04-07: 65 mg via INTRAVENOUS
  Filled 2019-04-07: qty 1.3

## 2019-04-07 NOTE — Procedures (Addendum)
Patient Name: [name]  MRN: [id number]  Epilepsy Attending: Lora Havens  Referring Physician/Provider: Dr. Cristal Ford Date: 04/07/2019 Duration: 26.30 minutes  Patient history: 48 year old male with prior history of seizure disorder who presented with altered mental status.  EEG to evaluate for seizures.  Level of alertness: awake  AEDs during EEG study: None  Technical aspects: This EEG study was done with scalp electrodes positioned according to the 10-20 International system of electrode placement. Electrical activity was acquired at a sampling rate of 500Hz  and reviewed with a high frequency filter of 70Hz  and a low frequency filter of 1Hz . EEG data were recorded continuously and digitally stored.   Description: During awake state, no clear posterior dominant rhythm was seen.  EEG showed continuous rhythmic 2 to 3 Hz delta activity in right temporoparietal region, which at times appeared sharply contoured.  There was also continuous low amplitude 2 to 5 Hz theta - delta slowing as well as intermittent rhythmic 2 to 3 Hz delta slowing seen in left hemisphere.  Physiologic photic driving was not seen during photic stimulation.  No EEG change was seen during hyperventilation other than continuous right temporoparietal delta slowing.   Abnormality -Continuous rhythmic delta slowing, right temporoparietal region -Continuous slow, left hemisphere  IMPRESSION: This study is suggestive of cortical dysfunction in the right temporoparietal region, likely secondary to underlying abnormality.  There is also evidence of cortical dysfunction in left hemisphere, nonspecific to etiology.  No seizures or definite epileptiform discharges were seen throughout the recording. However, only wakefulness was recorded. If suspicion for interictal activity remains a concern, a prolonged study including sleep should be considered.

## 2019-04-07 NOTE — Progress Notes (Signed)
EEG complete - results pending 

## 2019-04-07 NOTE — Consult Note (Addendum)
Neurology Consultation Reason for Consult: ams Referring Physician: Dr Cristal Ford  CC: Altered mental status  History is obtained from: Patient and chart review  HPI: [name] is a 48 y.o. LEFT handed male with history of seizure disorder and marijuana use who initially presented to [facility] on 04/05/2019 for altered mental status. From review of his admission H&P, patient was in usual state of health until about 7 days ago.  Since then his family only occasionally saw him.  On 04/04/2019, his sister saw him around 3 PM and noted that he was "out of it", unable to remember even his own birthday.  His confusion progressively worsened and therefore he was brought in the ED.  On arrival, his initial vital signs showed heart rate 112, blood pressure 156/104, respiratory rate 16, afebrile, saturating 100% on room air.  His BUN was 115, creatinine 10.38, sodium 135, potassium 4.8, serum glucose 167, AST 222, ALT 156, alk phos 67, total bilirubin 0.7, HIV nonreactive, acute hepatitis panel nonreactive, undetectable alcohol, ammonia 24, lactic acid 2.7, CK 2955, urine analysis showed 2 red bacteria, negative nitrites and UDS was positive for THC.  CT head without contrast was performed which did not show any acute abnormality.  Ultrasound of the abdomen was done which showed evidence of fatty liver.  Patient was being treated by medicine team for acute renal failure and acute metabolic encephalopathy likely secondary to uremia.  However patient continued to be encephalopathic and therefore neurology was consulted today.  Patient states he does not remember much of what has happened in the last 1 week.  He denies any headache, neck pain, balanitic, seizure-like episode, changes in vision, twitching of face, new medications, recent illness, recent travel.  I attempted to call patient's family at the number listed on epic but call went to voicemail.  EPILEPSY HISTORY: He states that he  occasionally has seizures, last one being about 2 years ago, described as generalized tonic-clonic seizures but denies ever seeing a physician for these and states he takes over-the-counter medications for his seizures.  Patient is unable to name any over-the-counter medications.   I reviewed previous note by Dr. Gaynell Face when patient was seen on 05/05/2005.  According to his note, patient woke up at 6:15 in the morning and was feeling normal.  Around 1015 he felt dizzy, unsteady on his feet.  Has history of some rotatory nystagmus and patient walked out of his room stumbling.  He started complaining of frontal headache and then had left focal motor seizure.  CT head was done at that time which did not show any acute abnormality.  Lumbar puncture was done which showed 11,320 RBCs, likely a traumatic tap.  However due to sudden onset of headache and possible seizure, catheter angiogram was performed which did not show any aneurysm.  Epilepsy risk factors: Patient had history of simple febrile seizures up to age 57.  Routine EEG 05/10/2005: Essentially normal EEG with the exception of occasional sharp  transients in the left frontotemporal region. Although no definite seizure  discharges are seen this could indicate some focal irritability in this  region which might serve to lower the seizure threshold. Clinical  correlation is recommended. Sleep-deprived EEG may be helpful if indicated.  MRI brain with and without contrast 05/06/2005: No acute infarct.  No seizure focus identified.  MRI brain with and without contrast 04/09/2007: No acute intracranial findings.  Possible small venous angioma in the right basal ganglia.  CT head without contrast  04/05/2019: No acute intracranial abnormality.  ROS: A 14 point ROS was performed and is negative except as noted in the HPI.   Past Medical History:  Diagnosis Date  . Cirrhosis ([place])   . Seizures ([place])     Family History  Problem Relation Age of Onset  .  Diabetes Mother   . Diabetes Sister     Social History:  reports that he has never smoked. He has never used smokeless tobacco. He reports current drug use. Drug: Marijuana. He reports that he does not drink alcohol.  Exam: Current vital signs: BP (!) 158/88 (BP Location: Right Arm)   Pulse 64   Temp 98.1 F (36.7 C) (Oral)   Resp 16   Ht _0  (1.727 m)   Wt 67 kg   SpO2 99%   BMI 22.46 kg/m  Vital signs in last 24 hours: Temp:  [98.1 F (36.7 C)-98.6 F (37 C)] 98.1 F (36.7 C) (12/29 0448) Pulse Rate:  [64-72] 64 (12/29 0448) Resp:  [16-18] 16 (12/29 0448) BP: (158-163)/(85-89) 158/88 (12/29 0448) SpO2:  [99 %-100 %] 99 % (12/29 0448) Weight:  [78 kg-67 kg] 67 kg (12/28 2142)   Physical Exam  Constitutional: Appears well-developed and well-nourished.  Psych: Affect appropriate to situation Eyes: No scleral injection HENT: No OP obstrucion Head: Normocephalic.  Cardiovascular: Normal rate and regular rhythm.  Respiratory: Effort normal, non-labored breathing GI: Soft.  No distension. There is no tenderness.  Skin: WDI  Neuro: Mental Status: Awake, alert to self, place but not to time, able to follow complex two-step commands Cranial Nerves: 2 through 12 grossly intact Motor: Tone is normal. Bulk is normal. 5/5 strength was present in all four extremities.  Sensory: Sensation is symmetric to light touch in all extremities  Deep Tendon Reflexes: 2+ and symmetric in the biceps and patellae. Plantars: Toes are downgoing bilaterally.  Cerebellar: FNF and HKS are intact bilaterally  I have reviewed labs in epic and the results pertinent to this consultation are:  BUN was 115, creatinine 10.38, sodium 135, potassium 4.8, serum glucose 167, AST 222, ALT 156, alk phos 67, total bilirubin 0.7, HIV nonreactive, acute hepatitis panel nonreactive, undetectable alcohol, ammonia 24, lactic acid 2.7, CK 2955, urine analysis showed 2 red bacteria, negative nitrites and UDS was  positive for THC.    I have reviewed the images obtained CT head without contrast 04/05/2019: No acute abnormality  MRI brain without contrast 04/07/2019: Diffusion-weighted and T2/FLAIR hyperintense signal abnormality within the medial temporal lobe/hippocampus as well as posteromedial  left thalamus. Mild associated parenchymal swelling. 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 MRI follow-up is recommended following seizure control to ensure resolution of signal abnormality and to exclude alternative etiologies (i.e. neoplasm).   Chronic blood products along the right parietal lobe, possibly posttraumatic.   ASSESSMENT/PLAN: 48 year old male with history of seizure disorder and marijuana use who presented with altered mental status for about a week, acute renal failure and elevated CK.   Acute encephalopathy -Broad differentials at this point: Possible HSV or atypical meningitis (low suspicion for bacterial meningitis at this point as patient has had altered mental status for about a week and does not have any other constitutional symptoms), seizures, autoimmune encephalitis, toxic-metabolic secondary to uremia -Patient has abnormal MRI brain as described above.  Ideally we will obtain MRI brain with contrast however unable to administer MRI contrast due to poor renal function.  Recommendations -As patient has abnormal  EEG and suspicion for possible meningitis, will start patient on Vimpat 50 mg twice daily.  If needed, this can be increased.  Avoiding Keppra due to poor renal function. -We will also empirically start treatment with acyclovir for possible HSV meningitis.  Patient received subcut heparin this afternoon therefore will plan for lumbar puncture tomorrow.  Please hold subcutaneous heparin. -We will also start patient on LTM EEG to monitor for any subclinical seizures -Continue seizure precautions -As  needed IV Ativan 2 mg for generalized tonic-clonic seizure lasting more than 2 minutes or focal seizure lasting more than 5 minutes -Continue management of other comorbidities per primary team  Thank you for allowing [place] to participate in the care of this patient.  Neurology will follow.  Please page neuro hospitalist for any further questions after 5 PM.

## 2019-04-07 NOTE — Progress Notes (Addendum)
PROGRESS NOTE    [name]  [id number] DOB: [birth year] DOA: 04/05/2019 PCP: Patient, No Pcp Per   Brief Narrative:  HPI on 04/05/2019 by Dr. Zada Finders [name] is a 48 y.o. 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  History is limited from patient and is otherwise supplemented from EDP, chart review, and sister at bedside.  Patient was reportedly in his usual state of health, completely alert and oriented and communicative 5 days ago.  Since then he is only been seen sparingly at home and per sister they thought he was out of the house for most of the time.  He reportedly has not been eating or drinking during that time.  On 04/03/2019 he was not seen at all but was felt to be in his room all day.  On 04/04/2019, he was seen by his sister around 3 PM and was notably confused and "out of it."  He was not able to remember his own birthday or family members.  They thought he was dehydrated and attempted to increase his fluid intake however his confusion has only been worsening.  He was brought to the ED for further evaluation.  Sister states that patient uses recreational drugs, suspected to be marijuana.  She states he does not use any alcohol or other illicit drugs.  He is not currently taking any medications regularly.  He occasionally uses Tylenol or ibuprofen for pain but his sister denies excessive use that she is the one that manages the medications at home.  He has a history of childhood seizures with a reported recurrent seizure episode occurring about 12 years ago.  He has not had any further seizure like activity and is not on antiepileptic medications.  Patient currently denies any pain.  He cannot remember if he has been having any nausea, vomiting, diarrhea, or difficulty with urination.  Interim history Admitted for AKI, placed on  IVF. Nephrology consulted.  Also noted to have acute rhabdomyolysis.  Have ordered EEG for possible seizure.  Discussed with neurology, recommended MRI. Assessment & Plan   Acute kidney injury -Possibly secondary to rhabdomyolysis with hyperkalemia -creatinine peaked to 11.58, however down to 10.5 -Of note, in 2019 creatinine was was 0.9 -UA showed large heme dipstick, 0-5 RBC -FeNA >2% -Nephrology consulted and appreciated, continue IV fluids -Patient was given Lokelma, potassium now normalized -Continue to monitor BMP  Acute nontraumatic rhabdomyolysis -CK level peaked at 14,903, currently down to 5716 -Continue IV fluids -Questionable etiology, possible seizure -EEG ordered  Acute metabolic encephalopathy -Appears to have resolved.  Suspect secondary to uremia from renal failure -CT head unremarkable, no acute intercranial normality -As above EEG ordered -Discussed with Dr. Malen Gauze, neurology, who recommended MRI  Transaminitis -Mildly elevated AST ALT, trending downward -Appears patient had elevated LFTs in 2019, viral hepatology serologies were negative at that time. -Ammonia, albumin, GGT within normal limits -Questionable history documented of cirrhosis in the past  -Abdominal ultrasound showed fatty liver -hepatitis panel nonreactive -Continue to monitor BMP  Hyperphosphatemia -In the setting of rhabdomyolysis -Improving, continue to monitor  Lactic acidosis  -Lactic acid 2.7 upon admission -continue IVF, down to 2  Marijuana abuse -Drug screen positive for THC  DVT Prophylaxis  heparin  Code Status: Full  Family Communication: None at bedside  Disposition Plan: Admitted. Pending improvement in renal function and nephrology recommendations.  Consultants Nephrology  Procedures  Abdominal US  Antibiotics   Anti-infectives (From admission, onward)   None      Subjective:   [name] seen and examined today.  Patient with no complaints today.   Does not recall the events that occurred in the last few days.  Denies shortness of breath, chest pain, abdominal pain, nausea vomiting, diarrhea or constipation, dizziness or headache.   Objective:   Vitals:   04/06/19 0730 04/06/19 1500 04/06/19 2142 04/07/19 0448  BP: (!) 162/87 (!) 163/85 (!) 162/89 (!) 158/88  Pulse: 67 66 72 64  Resp: 13 18 16 16   Temp:  98.4 F (36.9 C) 98.6 F (37 C) 98.1 F (36.7 C)  TempSrc:  Oral Oral Oral  SpO2: 100% 99% 100% 99%  Weight:  66 kg 67 kg   Height:  5\' 8"  (1.727 m)      Intake/Output Summary (Last 24 hours) at 04/07/2019 1029 Last data filed at 04/07/2019 0747 Gross per 24 hour  Intake 2633.33 ml  Output 0 ml  Net 2633.33 ml   Filed Weights   04/05/19 1854 04/06/19 1500 04/06/19 2142  Weight: 65 kg 66 kg 67 kg    Exam   General: Well developed, well nourished, thin, NAD, appears stated age  HEENT: NCAT, mucous membranes moist.  Poor dentition  Cardiovascular: S1 S2 auscultated, RRR, no murmur  Respiratory: Clear to auscultation bilaterally   Abdomen: Soft, nontender, nondistended, + bowel sounds  Extremities: warm dry without cyanosis clubbing or edema  Neuro: AAOx3 (person, place, time), mild nystagmus, nonfocal  Psych: Flat, however appropriate   Data Reviewed: I have personally reviewed following labs and imaging studies  CBC: Recent Labs  Lab 04/05/19 1532 04/06/19 0426  WBC 11.9* 10.4  HGB 15.7 13.6  HCT 48.3 40.6  MCV 80.6 79.1*  PLT 307 [id number]   Basic Metabolic Panel: Recent Labs  Lab 04/05/19 1532 04/06/19 0426 04/06/19 1633 04/07/19 0538  NA 135 136 135 139  K 4.8 5.4* 5.2* 3.9  CL 98 105 101 100  CO2 13* 15* 19* 24  GLUCOSE 167* 147* 115* 183*  BUN 115* 148* 159* 146*  CREATININE 10.38* 10.42* 11.58* 10.50*  CALCIUM 8.9 8.5* 8.6* 7.9*  PHOS  --  6.2*  --  5.4*   GFR: Estimated Creatinine Clearance: 8.1 mL/min (A) (by C-G formula based on SCr of 10.5 mg/dL (H)). Liver Function  Tests: Recent Labs  Lab 04/05/19 1532 04/06/19 0426 04/06/19 1633 04/07/19 0538  AST 222* 163* 126* 89*  ALT 156* 114* 102* 75*  ALKPHOS 67 51 50 41  BILITOT 0.7 0.8 0.9 0.7  PROT 7.7 5.7* 5.4* 4.6*  ALBUMIN 4.1 3.3*  3.3* 2.9* 2.4*  2.4*   No results for input(s): LIPASE, AMYLASE in the last 168 hours. Recent Labs  Lab 04/05/19 1756  AMMONIA 24   Coagulation Profile: Recent Labs  Lab 04/06/19 1633  INR 1.1   Cardiac Enzymes: Recent Labs  Lab 04/05/19 1532 04/06/19 0426 04/06/19 1633 04/07/19 0538  CKTOTAL 2,955* 14,903* 7,482* 5,716*   BNP (last 3 results) No results for input(s): PROBNP in the last 8760 hours. HbA1C: Recent Labs    04/06/19 0126  HGBA1C 6.9*   CBG: Recent Labs  Lab 04/05/19 1233 04/05/19 1756  GLUCAP 295* 161*   Lipid Profile: No results for input(s): CHOL, HDL, LDLCALC, TRIG, CHOLHDL, LDLDIRECT in the last 72 hours. Thyroid Function Tests: No results for input(s): TSH, T4TOTAL, FREET4, T3FREE, THYROIDAB in the last 72 hours.  Anemia Panel: No results for input(s): VITAMINB12, FOLATE, FERRITIN, TIBC, IRON, RETICCTPCT in the last 72 hours. Urine analysis:    Component Value Date/Time   COLORURINE YELLOW 04/05/2019 1242   APPEARANCEUR HAZY (A) 04/05/2019 1242   LABSPEC 1.014 04/05/2019 1242   PHURINE 5.0 04/05/2019 1242   GLUCOSEU 50 (A) 04/05/2019 1242   HGBUR LARGE (A) 04/05/2019 1242   BILIRUBINUR NEGATIVE 04/05/2019 1242   KETONESUR NEGATIVE 04/05/2019 1242   PROTEINUR 100 (A) 04/05/2019 1242   UROBILINOGEN 0.2 09/08/2007 1029   NITRITE NEGATIVE 04/05/2019 1242   LEUKOCYTESUR NEGATIVE 04/05/2019 1242   Sepsis Labs: @LABRCNTIP (procalcitonin:4,lacticidven:4)  ) Recent Results (from the past 240 hour(s))  Culture, blood (single)     Status: None (Preliminary result)   Collection Time: 04/05/19  5:51 PM   Specimen: BLOOD RIGHT FOREARM  Result Value Ref Range Status   Specimen Description   Final    BLOOD RIGHT  FOREARM Performed at [facility] [address], [place], [place] [address]    Special Requests   Final    BOTTLES DRAWN AEROBIC AND ANAEROBIC Blood Culture adequate volume Performed at [facility] [address], [place], [place] [address]    Culture   Final    NO GROWTH < 24 HOURS Performed at [facility] [address] [place], [place] [address]    Report Status PENDING  Incomplete  SARS CORONAVIRUS 2 (TAT 6-24 HRS) Nasopharyngeal Nasopharyngeal Swab     Status: None   Collection Time: 04/05/19  9:39 PM   Specimen: Nasopharyngeal Swab  Result Value Ref Range Status   SARS Coronavirus 2 NEGATIVE NEGATIVE Final    Comment: (NOTE) SARS-CoV-2 target nucleic acids are NOT DETECTED. The SARS-CoV-2 RNA is generally detectable in upper and lower respiratory specimens during the acute phase of infection. Negative results do not preclude SARS-CoV-2 infection, do not rule out co-infections with other pathogens, and should not be used as the sole basis for treatment or other patient management decisions. Negative results must be combined with clinical observations, patient history, and epidemiological information. The expected result is Negative. Fact Sheet for Patients: SugarRoll.be Fact Sheet for Healthcare Providers: https://www.woods-mathews.com/ This test is not yet approved or cleared by the [place] FDA and  has been authorized for detection and/or diagnosis of SARS-CoV-2 by FDA under an Emergency Use Authorization (EUA). This EUA will remain  in effect (meaning this test can be used) for the duration of the COVID-19 declaration under Section 56 4(b)(1) of the Act, 21 U.S.C. section 360bbb-3(b)(1), unless the authorization is terminated or revoked sooner. Performed at [facility], [place] [address] [place], [place] [address]       Radiology Studies: CT Head Wo  Contrast  Result Date: 04/05/2019 CLINICAL DATA:  Altered mental status. EXAM: CT HEAD WITHOUT CONTRAST TECHNIQUE: Contiguous axial images were obtained from the base of the skull through the vertex without intravenous contrast. COMPARISON:  09/08/2007 FINDINGS: Brain: There is no evidence for acute hemorrhage, hydrocephalus, mass lesion, or abnormal extra-axial fluid collection. No definite CT evidence for acute infarction. Vascular: No hyperdense vessel or unexpected calcification. Skull: No evidence for fracture. No worrisome lytic or sclerotic lesion. Sinuses/Orbits: The visualized paranasal sinuses and mastoid air cells are clear. Visualized portions of the globes and intraorbital fat are unremarkable. Other: None. IMPRESSION: 1. Unremarkable CT head.  No acute intracranial abnormality. Electronically Signed   By: Misty Stanley M.D.   On: 04/05/2019 13:12  US Abdomen Complete  Result Date: 04/05/2019 CLINICAL DATA:  48 year old male with acute renal failure. EXAM: ABDOMEN ULTRASOUND COMPLETE COMPARISON:  CT of the abdomen pelvis dated 11/20/2017. FINDINGS: Gallbladder: No gallstones or wall thickening visualized. No sonographic Murphy sign noted by sonographer. Common bile duct: Diameter: 5 mm Liver: There is diffuse increased liver echogenicity most commonly seen in the setting of fatty infiltration. Superimposed inflammation or fibrosis is not excluded. Clinical correlation is recommended. A 1 cm hypoechoic focus within the liver is not characterized but may represent an area of fat sparing or correspond to the hypoechoic structure seen on the CT of 11/20/2017. Portal vein is patent on color Doppler imaging with normal direction of blood flow towards the liver. IVC: No abnormality visualized. Pancreas: Visualized portion unremarkable. Spleen: Size and appearance within normal limits. Right Kidney: Length: 12 cm. Normal echogenicity. No hydronephrosis or shadowing stone. Left Kidney: Length: 12 cm.  Normal echogenicity. No hydronephrosis or shadowing stone. Abdominal aorta: No aneurysm visualized. Other findings: None. IMPRESSION: 1. Fatty liver. 2. No gallstone. Electronically Signed   By: Anner Crete M.D.   On: 04/05/2019 20:54     Scheduled Meds: . heparin  5,000 Units Subcutaneous Q8H   Continuous Infusions:   LOS: 2 days   Time Spent in minutes   45 minutes  Leng Montesdeoca D.O. on 04/07/2019 at 10:29 AM  Between 7am to 7pm - Please see pager noted on [email or web address]  After 7pm go to [email or web address]  And look for the night coverage person covering for me after hours  [facility] Office  [phone]

## 2019-04-07 NOTE — Plan of Care (Signed)
  Problem: Activity: Goal: Risk for activity intolerance will decrease Outcome: Progressing   

## 2019-04-07 NOTE — Progress Notes (Addendum)
Pharmacy Antibiotic Note  [name] is a 48 y.o. male admitted on 04/05/2019 with AMS.  Pharmacy has been consulted for acyclovir dosing for empiric herpes encephalitis.  Patient with ARF 2/2 rhabdomyolysis, may need dialysis. Hopefully will have renal recovery. Will dose acyclovir for ClCr < 10 ml/min.   Plan: Start acyclovoir 5mg /kg ideal wt = 335mg  Q24 hr Currently on LR @150  ml/hr, if stopped, consider adding pre/post hydration for IV acyclovir  F/u HSV VL F/u renal function, clinical status   Height: 5\' 8"  (172.7 cm) Weight: 147 lb 11.3 oz (67 kg) IBW/kg (Calculated) : 68.4  Temp (24hrs), Avg:98.4 F (36.9 C), Min:98.1 F (36.7 C), Max:98.6 F (37 C)  Recent Labs  Lab 04/05/19 1532 04/05/19 1752 04/05/19 2139 04/06/19 0426 04/06/19 1633 04/07/19 0538  WBC 11.9*  --   --  10.4  --   --   CREATININE 10.38*  --   --  10.42* 11.58* 10.50*  LATICACIDVEN  --  2.7* 2.0*  --   --   --     Estimated Creatinine Clearance: 8.1 mL/min (A) (by C-G formula based on SCr of 10.5 mg/dL (H)).    Allergies  Allergen Reactions  . Penicillins Other (See Comments)    Childhood allergy   . Sulfur Hives and Rash    Antimicrobials this admission: Acyclovir 12/29 >>  Microbiology results: 12/27 BCx: ngtd 12/27 covid neg   Thank you for allowing pharmacy to be a part of this patient's care.  Benetta Spar, PharmD, BCPS, BCCP Clinical Pharmacist  Please check AMION for all [place] phone numbers After 10:00 PM, call [place] [phone]

## 2019-04-07 NOTE — Progress Notes (Signed)
vLTM started  Neurology notified.  Event button tested  Staff and bedside family member advised on use of event button

## 2019-04-07 NOTE — Progress Notes (Signed)
Admit: 04/05/2019 LOS: 2  66M AKI from rhabdomyolysis, seizure disorder  Subjective:  . Slight improvement in BUN and SCr, K and HCO3 improved on NaHCO3 gtt . Unclear UOP, d/w RN . EEG in process . Pt still fuzzy on memory, states doesn't remember me from yesterday . No sig LEE, on RA, nl wob  . CK Downtrending  12/28 0701 - 12/29 0700 In: 2633.3 [P.O.:120; I.V.:2513.3] Out: 0   Filed Weights   04/05/19 1854 04/06/19 1500 04/06/19 2142  Weight: 65 kg 66 kg 67 kg    Scheduled Meds: . heparin  5,000 Units Subcutaneous Q8H   Continuous Infusions: PRN Meds:.acetaminophen **OR** acetaminophen, labetalol, ondansetron **OR** ondansetron (ZOFRAN) IV  Current Labs: reviewed    Physical Exam:  Blood pressure (!) 158/88, pulse 64, temperature 98.1 F (36.7 C), temperature source Oral, resp. rate 16, height 5\' 8"  (1.727 m), weight 67 kg, SpO2 99 %. GEN: NAD ENT: NCAT EYES: EOMI CV: Regular, normal S1 and S2 PULM: Clear bilaterally ABD: Soft, nontender, no HSM, NABS SKIN: No rashes or lesions EXT: No peripheral edema  A 1. Severe AKI secondary to rhabdomyolysis 2. Mild hyperkalemia 3. Metabolic acidosis with anion gap of 16 4. Rhabdomyolysis, etiology unclear, see above 5. Seizure disorder currently not on antiepileptics 6. Confusional state, amnesia 7. Hyperuricemia   P . With some improvement in labs in past 24h, will cont for another day,  . Still might req HD . Change to LR @ 150/h with CK improved and K better . BMP this PM . Check Uric Acid again . Medication Issues; o Preferred narcotic agents for pain control are hydromorphone, fentanyl, and methadone. Morphine should not be used.  o Baclofen should be avoided o Avoid oral sodium phosphate and magnesium citrate based laxatives / bowel preps    Pearson Grippe MD 04/07/2019, 1:11 PM  Recent Labs  Lab 04/06/19 0426 04/06/19 1633 04/07/19 0538  NA 136 135 139  K 5.4* 5.2* 3.9  CL 105 101 100  CO2 15* 19*  24  GLUCOSE 147* 115* 183*  BUN 148* 159* 146*  CREATININE 10.42* 11.58* 10.50*  CALCIUM 8.5* 8.6* 7.9*  PHOS 6.2*  --  5.4*   Recent Labs  Lab 04/05/19 1532 04/06/19 0426  WBC 11.9* 10.4  HGB 15.7 13.6  HCT 48.3 40.6  MCV 80.6 79.1*  PLT 307 244

## 2019-04-08 ENCOUNTER — Encounter: Payer: Self-pay | Admitting: Family Medicine

## 2019-04-08 LAB — CSF CELL COUNT WITH DIFFERENTIAL
RBC Count, CSF: 1010 /mm3 — ABNORMAL HIGH
Tube #: 3
WBC, CSF: 3 /mm3 (ref 0–5)

## 2019-04-08 LAB — TSH: TSH: 2.065 u[IU]/mL (ref 0.350–4.500)

## 2019-04-08 LAB — CBC
HCT: 36.6 % — ABNORMAL LOW (ref 39.0–52.0)
Hemoglobin: 12.4 g/dL — ABNORMAL LOW (ref 13.0–17.0)
MCH: 26.5 pg (ref 26.0–34.0)
MCHC: 33.9 g/dL (ref 30.0–36.0)
MCV: 78.2 fL — ABNORMAL LOW (ref 80.0–100.0)
Platelets: 229 10*3/uL (ref 150–400)
RBC: 4.68 MIL/uL (ref 4.22–5.81)
RDW: 13 % (ref 11.5–15.5)
WBC: 9.4 10*3/uL (ref 4.0–10.5)
nRBC: 0 % (ref 0.0–0.2)

## 2019-04-08 LAB — RENAL FUNCTION PANEL
Albumin: 2.3 g/dL — ABNORMAL LOW (ref 3.5–5.0)
Anion gap: 16 — ABNORMAL HIGH (ref 5–15)
BUN: 138 mg/dL — ABNORMAL HIGH (ref 6–20)
CO2: 26 mmol/L (ref 22–32)
Calcium: 8.3 mg/dL — ABNORMAL LOW (ref 8.9–10.3)
Chloride: 98 mmol/L (ref 98–111)
Creatinine, Ser: 11.17 mg/dL — ABNORMAL HIGH (ref 0.61–1.24)
GFR calc Af Amer: 6 mL/min — ABNORMAL LOW (ref >60)
GFR calc non Af Amer: 5 mL/min — ABNORMAL LOW (ref >60)
Glucose, Bld: 97 mg/dL (ref 70–99)
Phosphorus: 6.8 mg/dL — ABNORMAL HIGH (ref 2.5–4.6)
Potassium: 4.3 mmol/L (ref 3.5–5.1)
Sodium: 140 mmol/L (ref 135–145)

## 2019-04-08 LAB — PROTEIN AND GLUCOSE, CSF
Glucose, CSF: 62 mg/dL (ref 40–70)
Total  Protein, CSF: 53 mg/dL — ABNORMAL HIGH (ref 15–45)

## 2019-04-08 LAB — CRYPTOCOCCAL ANTIGEN, CSF: Crypto Ag: NEGATIVE

## 2019-04-08 LAB — LACTIC ACID, PLASMA: Lactic Acid, Venous: 1.2 mmol/L (ref 0.5–1.9)

## 2019-04-08 LAB — VITAMIN B12: Vitamin B-12: 359 pg/mL (ref 180–914)

## 2019-04-08 MED ORDER — LIDOCAINE HCL (PF) 1 % IJ SOLN
INTRAMUSCULAR | Status: AC
  Filled 2019-04-08: qty 5

## 2019-04-08 MED ORDER — LIDOCAINE HCL (PF) 1 % IJ SOLN
5.0000 mL | Freq: Once | INTRAMUSCULAR | Status: DC
  Filled 2019-04-08: qty 5

## 2019-04-08 NOTE — Progress Notes (Addendum)
Admit: 04/05/2019 LOS: 3  70M AKI from rhabdomyolysis, seizure disorder  Subjective:  . LP this AM . Already 1.7L UOP . SCr still quite high, K and HCO3 ok . Updated sister here locally and niece (who is Psychologist, sport and exercise in [place])  12/29 0701 - 12/30 0700 In: 1593.3 [P.O.:120; I.V.:1411.6; IV Piggyback:61.7] Out: 850 [Urine:850]  Filed Weights   04/05/19 1854 04/06/19 1500 04/06/19 2142  Weight: 65 kg 66 kg 67 kg    Scheduled Meds: . lidocaine (PF)  5 mL Other Once  . lidocaine (PF)       Continuous Infusions: . acyclovir    . lacosamide (VIMPAT) IV 50 mg (04/08/19 1021)  . lactated ringers 150 mL/hr at 04/08/19 0535   PRN Meds:.acetaminophen **OR** acetaminophen, labetalol, LORazepam, ondansetron **OR** ondansetron (ZOFRAN) IV  Current Labs: reviewed    Physical Exam:  Blood pressure (!) 173/94, pulse 66, temperature 98 F (36.7 C), temperature source Oral, resp. rate 18, height 5\' 8"  (1.727 m), weight 67 kg, SpO2 100 %. GEN: NAD ENT: NCAT EYES: EOMI CV: Regular, normal S1 and S2 PULM: Clear bilaterally ABD: Soft, nontender, no HSM, NABS SKIN: No rashes or lesions EXT: No peripheral edema  A 1. Severe AKI secondary to rhabdomyolysis 2. Mild hyperkalemia, resolved 3. Metabolic acidosis improved 4. Rhabdomyolysis, etiology unclear, see above 5. Seizure disorder on Vimpat 6. Confusional state, amnesia, ? HSV encephalitis.  LP 04/08/19 7. Hyperuricemia, ? Related to rhabdo  P . NOrmally I would start HD here given sig azotemia except his UOP is so strong today suggesting he is opening up and recovering GFR . Will wait another 24h, f/u labs and UOP in AM, reassess . Cont IVFs for another 24, reduce to 172mL/h . Medication Issues; o Preferred narcotic agents for pain control are hydromorphone, fentanyl, and methadone. Morphine should not be used.  o Baclofen should be avoided o Avoid oral sodium phosphate and magnesium citrate based laxatives / bowel preps    Pearson Grippe MD 04/08/2019, 1:13 PM  Recent Labs  Lab 04/06/19 0426 04/07/19 0538 04/07/19 1453 04/08/19 0530  NA 136 139 139 140  K 5.4* 3.9 3.6 4.3  CL 105 100 96* 98  CO2 15* 24 30 26   GLUCOSE 147* 183* 122* 97  BUN 148* 146* 145* 138*  CREATININE 10.42* 10.50* 10.87* 11.17*  CALCIUM 8.5* 7.9* 8.0* 8.3*  PHOS 6.2* 5.4*  --  6.8*   Recent Labs  Lab 04/05/19 1532 04/06/19 0426 04/08/19 0530  WBC 11.9* 10.4 9.4  HGB 15.7 13.6 12.4*  HCT 48.3 40.6 36.6*  MCV 80.6 79.1* 78.2*  PLT 307 244 229

## 2019-04-08 NOTE — Progress Notes (Addendum)
Reason for consult: Altered mental status  Subjective: No acute events overnight.  Patient states he does not remember anything from yesterday.  He had lumbar puncture today.  Now complaining of bifrontal headache.  Denies any other concerns.   ROS: negative except above  Examination  Vital signs in last 24 hours: Temp:  [98 F (36.7 C)-98.6 F (37 C)] 98 F (36.7 C) (12/30 0913) Pulse Rate:  [64-79] 66 (12/30 0913) Resp:  [16-18] 18 (12/30 0913) BP: (150-173)/(91-99) 173/94 (12/30 0913) SpO2:  [97 %-100 %] 100 % (12/30 0913)  General: lying in bed, NAD CVS: pulse-normal rate and rhythm RS: breathing comfortably Extremities: normal   Neuro: MS: Alert, oriented to place and person but not to time, follows commands CN: pupils equal and reactive,  EOMI, face symmetric, tongue midline, normal sensation over face Motor: 5/5 strength in all 4 extremities Reflexes: 2+ bilaterally over patella, biceps, plantars: flexor Coordination: normal Gait: not tested  Basic Metabolic Panel: Recent Labs  Lab 04/06/19 0426 04/06/19 1633 04/07/19 0538 04/07/19 1453 04/08/19 0530  NA 136 135 139 139 140  K 5.4* 5.2* 3.9 3.6 4.3  CL 105 101 100 96* 98  CO2 15* 19* 24 30 26   GLUCOSE 147* 115* 183* 122* 97  BUN 148* 159* 146* 145* 138*  CREATININE 10.42* 11.58* 10.50* 10.87* 11.17*  CALCIUM 8.5* 8.6* 7.9* 8.0* 8.3*  PHOS 6.2*  --  5.4*  --  6.8*    CBC: Recent Labs  Lab 04/05/19 1532 04/06/19 0426 04/08/19 0530  WBC 11.9* 10.4 9.4  HGB 15.7 13.6 12.4*  HCT 48.3 40.6 36.6*  MCV 80.6 79.1* 78.2*  PLT 307 244 229     Coagulation Studies: Recent Labs    04/06/19 1633  LABPROT 13.6  INR 1.1    Imaging MRI brain without contrast 04/07/2019: Diffusion-weighted and T2/FLAIR hyperintense signal abnormality within the medial temporal lobe/hippocampus as well as posteromedial  left thalamus. Mild associated parenchymal swelling. 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 MRI follow-up is recommended following seizure control to ensure resolution of signal abnormality and to exclude alternative etiologies (i.e. neoplasm).   Chronic blood products along the right parietal lobe, possibly posttraumatic.   ASSESSMENT/PLAN: 48 year old male with history of seizure disorder and marijuana use who presented with altered mental status for about a week, acute renal failure and elevated CK.   Acute encephalopathy Uremia Transaminitis -Broad differentials at this point: Possible HSV or atypical meningitis (low suspicion for bacterial meningitis at this point as patient has had altered mental status for about a week and does not have any other constitutional symptoms), seizures, autoimmune encephalitis, toxic-metabolic secondary to uremia -Patient has abnormal MRI brain as described above.  Ideally we will obtain MRI brain with contrast however unable to administer MRI contrast due to poor renal function.  Recommendations -No definite seizures overnight on LTM EEG, will discontinue -Continue Vimpat 50 mg twice daily.  If needed, this can be increased.  Avoiding Keppra due to poor renal function. -Continue empiric treatment with acyclovir.  Lumbar puncture performed this morning, awaiting results, will review once available.   - will also chck thiamine, B12, TSH as part of encephalopathy workup.  -Continue seizure precautions -As needed IV Ativan 2 mg for generalized tonic-clonic seizure lasting more than 2 minutes or focal seizure lasting more than 5 minutes -Continue management of other comorbidities per primary team  Thank you for allowing [place] to participate in the care  of this patient.  Neurology will follow.  Please page neuro hospitalist for any further questions after 5 PM.  I have spent a total of  25 minutes with the patient reviewing hospital notes,  test results, labs and examining the  patient as well as establishing an assessment and plan that was discussed personally with the patient.  > 50% of time was spent in direct patient care.

## 2019-04-08 NOTE — Progress Notes (Signed)
Hospitalist daily note   Emmette Krog [id number] DOB: 24-Nov-1970 DOA: 04/05/2019  PCP: Patient, No Pcp Per   Narrative:  48 year old remote history of recurrent partial seizures 2007, cannabis use Patient admitted for metabolic encephalopathy in the setting of acute renal failure with elevated LFTs as well as rhabdomyolysis-EEG ordered nephrology consulted as per neurologist  Data Reviewed:  BUN/creatinine from admission 115/10.3-->138/11.17 Hemoglobin 12.4 platelet 229 WBC 9.4 CT head 12/27-unremarkable CT Ultrasound abdomen 12/27 pelvis showed fatty liver no gallstones MRI brain 12/29 hyperintense signal abnormality within the medial temporal lobe as well as posterior medial left thalamus-?  HSV encephalitis or autoimmune encephalitis possibly superimposed seizure related edema chronic blood products along the right parietal lobe possibly posttraumatic  Assessment & Plan: Toxic metabolic encephalopathy -etiology unclear-work-up being performed by neurology-MRI performed questioning encephalitis may be autoimmune LP performed 12/30 and labs are pending EEG has been discontinued ?  Uremia as etiology Might decide on steroids there seems to be no source of infection discussed with Dr. Hortense Ramal  Acute kidney injury 2/2 rhabdo Patient is +4 L and is therefore somewhat oliguric Continuing lactated Ringer 150/h however labs remain relatively stable CK not done today we will repeat in a.m. for completion sake  Transaminitis Ultrasound liver fatty liver hepatitis panel normal other work-up normal-monitor trends  Hyperphosphatemia Improved  Marijuana use No other drugs positive on drug screen  Subjective: Awake alert listless and does not respond to orienting questions well dates he cannot remember No chest pain no fever no chills no nausea no vomiting Consultants:   Nephrology  Neurology Procedures:   As above Antimicrobials:   None   Objective: Vitals:   04/07/19 0448  04/07/19 1805 04/07/19 2031 04/08/19 0510  BP: (!) 158/88 (!) 163/99 (!) 150/91 (!) 153/98  Pulse: 64 79 69 64  Resp: 16 18 16 18   Temp: 98.1 F (36.7 C) 98.2 F (36.8 C) 98.6 F (37 C) 98.2 F (36.8 C)  TempSrc: Oral Oral Oral Oral  SpO2: 99% 98% 97% 98%  Weight:      Height:        Intake/Output Summary (Last 24 hours) at 04/08/2019 0743 Last data filed at 04/08/2019 0600 Gross per 24 hour  Intake 1593.27 ml  Output 850 ml  Net 743.27 ml   Filed Weights   04/05/19 1854 04/06/19 1500 04/06/19 2142  Weight: 65 kg 66 kg 67 kg    Examination: Awake alert coherent but cannot tell me the date time year CTA B no added sounds S1-S2 no murmur Abdomen soft no rebound no guarding No lower extremity edema No focal deficit moving all 4 limbs    Scheduled Meds: Continuous Infusions: . acyclovir    . lacosamide (VIMPAT) IV 50 mg (04/07/19 2216)  . lactated ringers 150 mL/hr at 04/08/19 0535     LOS: 3 days   Time spent: [place], MD [facility]

## 2019-04-08 NOTE — Progress Notes (Signed)
LTM EEG discontinued - no skin breakdown at unhook.   

## 2019-04-08 NOTE — Plan of Care (Signed)
?  Problem: Clinical Measurements: ?Goal: Ability to maintain clinical measurements within normal limits will improve ?Outcome: Not Progressing ?  ?

## 2019-04-08 NOTE — Procedures (Addendum)
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    Consent obtained from: sister Davene Costain over phone witnessed by RN   Procedure Note The patient was prepped and draped, and using sterile technique a 20 gauge quinke spinal needle was inserted in the L4-5 space.  CSF return on first try. Opening pressure was 15 cm H2O.  Color: straw colored in all 4 tubes Approximately 18 cc of CSF were obtained and sent for analysis.  Patient tolerated the procedure well and blood loss was minimal. Instructed lab to save the extra csf for additional testing if deemed necessary at a later point based on preliminary results.   -- Amie Portland, MD [facility] Pager: [phone] If 7pm to 7am, please call on call as listed on AMION.

## 2019-04-08 NOTE — Procedures (Signed)
Patient Name: [name]  MRN: [id number]  Epilepsy Attending: Lora Havens  Referring Physician/Provider: Dr. Zeb Comfort Duration:  04/07/2019 1528 to 04/08/2019 [id number]  Patient history: 48 year old male with prior history of seizure disorder who presented with altered mental status.  EEG to evaluate for seizures.  Level of alertness: awake, asleep  AEDs during EEG study:  Vimpat  Technical aspects: This EEG study was done with scalp electrodes positioned according to the 10-20 International system of electrode placement. Electrical activity was acquired at a sampling rate of 500Hz  and reviewed with a high frequency filter of 70Hz  and a low frequency filter of 1Hz . EEG data were recorded continuously and digitally stored.   Description: During awake state, no clear posterior dominant rhythm was seen.    Sleep was characterized by vertex waves, sleep spindles (12 to 14 Hz), maximal frontocentral.  EEG showed continuous rhythmic 2 to 3 Hz delta activity in right temporoparietal region, which at times appeared sharply contoured.  There was also continuous low amplitude 2 to 5 Hz theta - delta slowing in left hemisphere as well as intermittent rhythmic generalized 2 to 3 Hz delta slowing.    Abnormality -Continuous rhythmic delta slowing, right temporoparietal region -Continuous slow, left hemisphere -Intermittent rhythmic slow, generalized  IMPRESSION: This study is suggestive of cortical dysfunction in the right temporoparietal region, likely secondary to underlying abnormality.  There is also evidence of cortical dysfunction in left hemisphere, nonspecific to etiology.    Additionally, there is evidence of mild to moderate diffuse encephalopathy.  No seizures or definite epileptiform discharges were seen throughout the recording.   [name]

## 2019-04-09 ENCOUNTER — Inpatient Hospital Stay (HOSPITAL_COMMUNITY): Payer: Medicaid Other

## 2019-04-09 LAB — CK: Total CK: 1720 U/L — ABNORMAL HIGH (ref 49–397)

## 2019-04-09 LAB — RENAL FUNCTION PANEL
Albumin: 2.4 g/dL — ABNORMAL LOW (ref 3.5–5.0)
Anion gap: 21 — ABNORMAL HIGH (ref 5–15)
BUN: 137 mg/dL — ABNORMAL HIGH (ref 6–20)
CO2: 22 mmol/L (ref 22–32)
Calcium: 8.7 mg/dL — ABNORMAL LOW (ref 8.9–10.3)
Chloride: 99 mmol/L (ref 98–111)
Creatinine, Ser: 11.03 mg/dL — ABNORMAL HIGH (ref 0.61–1.24)
GFR calc Af Amer: 6 mL/min — ABNORMAL LOW (ref >60)
GFR calc non Af Amer: 5 mL/min — ABNORMAL LOW (ref >60)
Glucose, Bld: 112 mg/dL — ABNORMAL HIGH (ref 70–99)
Phosphorus: 6.9 mg/dL — ABNORMAL HIGH (ref 2.5–4.6)
Potassium: 4.5 mmol/L (ref 3.5–5.1)
Sodium: 142 mmol/L (ref 135–145)

## 2019-04-09 LAB — ANGIOTENSIN CONVERTING ENZYME, CSF: Angio Convert Enzyme: <1.5 U/L (ref 0.0–2.5)

## 2019-04-09 LAB — CYTOLOGY - NON PAP

## 2019-04-09 LAB — HSV DNA BY PCR (REFERENCE LAB)
HSV 1 DNA: NEGATIVE
HSV 2 DNA: NEGATIVE

## 2019-04-09 LAB — VDRL, CSF: VDRL Quant, CSF: NONREACTIVE

## 2019-04-09 LAB — CSF IGG: IgG, CSF: 4.5 mg/dL (ref 0.0–8.6)

## 2019-04-09 LAB — ANGIOTENSIN CONVERTING ENZYME: Angiotensin-Converting Enzyme: 16 U/L (ref 14–82)

## 2019-04-09 MED ORDER — HEPARIN SODIUM (PORCINE) 5000 UNIT/ML IJ SOLN
5000.0000 [IU] | Freq: Three times a day (TID) | INTRAMUSCULAR | Status: AC
  Administered 2019-04-09: 5000 [IU] via SUBCUTANEOUS
  Filled 2019-04-09: qty 1

## 2019-04-09 MED ORDER — TRAMADOL HCL 50 MG PO TABS
50.0000 mg | ORAL_TABLET | Freq: Four times a day (QID) | ORAL | Status: DC | PRN
  Administered 2019-04-09 (×2): 50 mg via ORAL
  Filled 2019-04-09 (×2): qty 1

## 2019-04-09 MED ORDER — PENTAFLUOROPROP-TETRAFLUOROETH EX AERO: 1.0000 "application " | INHALATION_SPRAY | CUTANEOUS | Status: DC | PRN

## 2019-04-09 MED ORDER — CHLORHEXIDINE GLUCONATE CLOTH 2 % EX PADS
6.0000 | MEDICATED_PAD | Freq: Every day | CUTANEOUS | Status: DC
  Administered 2019-04-12 – 2019-04-19 (×7): 6 via TOPICAL

## 2019-04-09 MED ORDER — VANCOMYCIN HCL IN DEXTROSE 1-5 GM/200ML-% IV SOLN
1000.0000 mg | INTRAVENOUS | Status: DC
  Filled 2019-04-09: qty 200

## 2019-04-09 MED ORDER — METOPROLOL TARTRATE 12.5 MG HALF TABLET
12.5000 mg | ORAL_TABLET | Freq: Two times a day (BID) | ORAL | Status: DC
  Administered 2019-04-09 (×2): 12.5 mg via ORAL
  Filled 2019-04-09 (×3): qty 1

## 2019-04-09 MED ORDER — HEPARIN SODIUM (PORCINE) 1000 UNIT/ML DIALYSIS: 1000.0000 [IU] | INTRAMUSCULAR | Status: DC | PRN

## 2019-04-09 MED ORDER — LIDOCAINE-PRILOCAINE 2.5-2.5 % EX CREA: 1.0000 "application " | TOPICAL_CREAM | CUTANEOUS | Status: DC | PRN

## 2019-04-09 MED ORDER — ALTEPLASE 2 MG IJ SOLR: 2.0000 mg | Freq: Once | INTRAMUSCULAR | Status: DC | PRN

## 2019-04-09 MED ORDER — VANCOMYCIN HCL IN DEXTROSE 1-5 GM/200ML-% IV SOLN
1000.0000 mg | INTRAVENOUS | Status: AC
  Administered 2019-04-10: 1000 mg via INTRAVENOUS
  Filled 2019-04-09 (×2): qty 200

## 2019-04-09 MED ORDER — SODIUM CHLORIDE 0.9 % IV SOLN
100.0000 mL | INTRAVENOUS | Status: DC | PRN
  Administered 2019-04-16: 100 mL via INTRAVENOUS

## 2019-04-09 MED ORDER — SODIUM CHLORIDE 0.9 % IV SOLN: 100.0000 mL | INTRAVENOUS | Status: DC | PRN

## 2019-04-09 MED ORDER — LIDOCAINE HCL (PF) 1 % IJ SOLN: 5.0000 mL | INTRAMUSCULAR | Status: DC | PRN

## 2019-04-09 NOTE — Progress Notes (Signed)
[facility] Progress Note    Assessment/ Plan:   1. Severe AKI secondary to rhabdomyolysis--> with evidence of uremia.  Need to start dialysis.  NPO order placed, IR order placed, appreciate assistance.  HD orders written as well 2. Mild hyperkalemia, resolved 3. Metabolic acidosis improved 4. Rhabdomyolysis, etiology unclear, see above 5. Seizure disorder on Vimpat 6. Confusional state, amnesia, ? HSV encephalitis.  LP 04/08/19 which is largely negative 7. Hyperuricemia, ? Related to rhabdo  Subjective:    Confused, nauseated, asterixis present today.  Will need to begin dialysis.  D/w pt and called sister [name] who is in agreement.     Objective:   BP (!) 169/96 (BP Location: Left Arm)   Pulse 66   Temp 98.6 F (37 C) (Oral)   Resp 16   Ht 5\' 8"  (1.727 m)   Wt 66.8 kg   SpO2 98%   BMI 22.40 kg/m   Intake/Output Summary (Last 24 hours) at 04/09/2019 1222 Last data filed at 04/09/2019 0900 Gross per 24 hour  Intake 2886.67 ml  Output 2725 ml  Net 161.67 ml   Weight change:   Physical Exam: Gen: appears ill CVS: RRR Resp: clear bilaterally no c/w/r Abd: soft  Ext: no LE edema  Imaging: MR BRAIN WO CONTRAST  Addendum Date: 04/07/2019   ADDENDUM REPORT: 04/07/2019 13:48 ADDENDUM: These results were called by telephone at the time of interpretation on 04/07/2019 at 1:48 pm to provider [facility] , who verbally acknowledged these results. Electronically Signed   By: Kellie Simmering DO   On: 04/07/2019 13:48   Result Date: 04/07/2019 CLINICAL DATA:  Seizure, chronic, history of trauma. Additional history provided: Altered mental status EXAM: MRI HEAD WITHOUT CONTRAST TECHNIQUE: Multiplanar, multiecho pulse sequences of the brain and surrounding structures were obtained without intravenous contrast. COMPARISON:  Head CT 04/05/2019, brain MRI 04/07/2007 FINDINGS: Brain: Intermittently motion degraded examination. There is diffusion weighted and T2/FLAIR  hyperintensity within the medial temporal lobe/hippocampus as well as posteromedial left thalamus. There is mild associated parenchymal swelling. There is a small amount of SWI signal loss along the right parietal lobe likely reflecting chronic blood products (series 7, image 81). No focal parenchymal signal abnormality elsewhere within the brain. No midline shift or extra-axial fluid collection. Cerebral volume is normal for age. Vascular: Flow voids maintained within the proximal large arterial vessels. Skull and upper cervical spine: No focal marrow lesion Sinuses/Orbits: Visualized orbits demonstrate no acute abnormality. Mild scattered paranasal sinus mucosal thickening. Right maxillary sinus mucous retention cysts. No significant mastoid effusion. IMPRESSION: Diffusion-weighted and T2/FLAIR hyperintense signal abnormality within the medial temporal lobe/hippocampus as well as posteromedial left thalamus. Mild associated parenchymal swelling. 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 MRI follow-up is recommended following seizure control to ensure resolution of signal abnormality and to exclude alternative etiologies (i.e. neoplasm) Chronic blood products along the right parietal lobe, possibly posttraumatic. Electronically Signed: By: Kellie Simmering DO On: 04/07/2019 13:35   Overnight EEG with video  Result Date: 04/08/2019 Lora Havens, MD     04/08/2019 10:59 AM Patient Name: [name] MRN: [id number] Epilepsy Attending: Lora Havens Referring Physician/Provider: Dr. Zeb Comfort Duration:  04/07/2019 1528 to 04/08/2019 0918 Patient history: 48 year old male with prior history of seizure disorder who presented with altered mental status.  EEG to evaluate for seizures.  Level of alertness: awake, asleep  AEDs during EEG study:  Vimpat  Technical aspects: This EEG study  was done with scalp electrodes positioned  according to the 10-20 International system of electrode placement. Electrical activity was acquired at a sampling rate of 500Hz  and reviewed with a high frequency filter of 70Hz  and a low frequency filter of 1Hz . EEG data were recorded continuously and digitally stored.  Description: During awake state, no clear posterior dominant rhythm was seen.    Sleep was characterized by vertex waves, sleep spindles (12 to 14 Hz), maximal frontocentral.  EEG showed continuous rhythmic 2 to 3 Hz delta activity in right temporoparietal region, which at times appeared sharply contoured.  There was also continuous low amplitude 2 to 5 Hz theta - delta slowing in left hemisphere as well as intermittent rhythmic generalized 2 to 3 Hz delta slowing.   Abnormality -Continuous rhythmic delta slowing, right temporoparietal region -Continuous slow, left hemisphere -Intermittent rhythmic slow, generalized  IMPRESSION: This study is suggestive of cortical dysfunction in the right temporoparietal region, likely secondary to underlying abnormality.  There is also evidence of cortical dysfunction in left hemisphere, nonspecific to etiology.    Additionally, there is evidence of mild to moderate diffuse encephalopathy.  No seizures or definite epileptiform discharges were seen throughout the recording. Lora Havens    Labs: BMET Recent Labs  Lab 04/05/19 1532 04/06/19 0426 04/06/19 1633 04/07/19 0538 04/07/19 1453 04/08/19 0530 04/09/19 0553  NA 135 136 135 139 139 140 142  K 4.8 5.4* 5.2* 3.9 3.6 4.3 4.5  CL 98 105 101 100 96* 98 99  CO2 13* 15* 19* 24 30 26 22   GLUCOSE 167* 147* 115* 183* 122* 97 112*  BUN 115* 148* 159* 146* 145* 138* 137*  CREATININE 10.38* 10.42* 11.58* 10.50* 10.87* 11.17* 11.03*  CALCIUM 8.9 8.5* 8.6* 7.9* 8.0* 8.3* 8.7*  PHOS  --  6.2*  --  5.4*  --  6.8* 6.9*   CBC Recent Labs  Lab 04/05/19 1532 04/06/19 0426 04/08/19 0530  WBC 11.9* 10.4 9.4  HGB 15.7 13.6 12.4*  HCT 48.3 40.6  36.6*  MCV 80.6 79.1* 78.2*  PLT 307 244 229    Medications:    . Chlorhexidine Gluconate Cloth  6 each Topical Q0600  . heparin injection (subcutaneous)  5,000 Units Subcutaneous Q8H  . lidocaine (PF)  5 mL Other Once  . metoprolol tartrate  12.5 mg Oral BID      Madelon Lips, MD 04/09/2019, 12:22 PM

## 2019-04-09 NOTE — Progress Notes (Signed)
Hospitalist daily note   [name] [id number] DOB: 1971/03/10 DOA: 04/05/2019  PCP: Patient, No Pcp Per   Narrative:  48 year old remote history of recurrent partial seizures 2007, cannabis use Patient admitted for metabolic encephalopathy in the setting of acute renal failure with elevated LFTs as well as rhabdomyolysis-EEG ordered nephrology consulted as per neurologist  Data Reviewed:  BUN/creatinine from admission 115/10.3-->138/11.17-->137/11.03, Anion gap 21 CK total 1720 CT head 12/27-unremarkable CT Angiotensin converting enzyme 16, TSH 2.065, B12 359, CSF cell count and differential showed 3 WBC yellow, Gram stain showed no organisms no WBC Ultrasound abdomen 12/27 pelvis showed fatty liver no gallstones MRI brain 12/29 hyperintense signal abnormality within the medial temporal lobe as well as posterior medial left thalamus-?  HSV encephalitis or autoimmune encephalitis possibly superimposed seizure related edema chronic blood products along the right parietal lobe possibly posttraumatic  Assessment & Plan: Toxic metabolic encephalopathy -etiology unclear-work-up being performed by neurology-MRI performed questioning encephalitis may be autoimmune Continue Vimpat 50 every 12 as per neurology-Ativan for seizure as needed LP performed 12/30 and labs not suggestive of bacterial for now continue acyclovir for questionable herpes encephalitis EEG has been discontinued and was grossly normal 12/30 ?  Uremia as etiology Might decide on steroids there seems to be no source of infection discussed with Dr. Hortense Ramal  Acute kidney injury 2/2 rhabdo Similar output over the last 24 hours but may be increasing Awake further input from nephrology Fluid rate was cut back to 100 cc/h yesterday CK seems to be trending downwards from a peak 14,000   Transaminitis Ultrasound liver fatty liver hepatitis panel normal other work-up normal-monitor trends  Hyperphosphatemia Elevated to 73-defer  binders to renal  Marijuana use No other drugs positive on drug screen  HTN uncontrolled Blood pressures are brought up 170 start metoprolol low-dose 12.5 twice daily and can use labetalol 180   Subjective: Patient complains of headache this morning 10/10 He is npo for unclear reason  Consultants:   Nephrology  Neurology Procedures:   As above Antimicrobials:   None   Objective: Vitals:   04/08/19 0913 04/08/19 1652 04/08/19 2051 04/09/19 0600  BP: (!) 173/94 (!) 169/92 (!) 169/93 (!) 163/91  Pulse: 66 69 74 (!) 56  Resp: 18 18 18 12   Temp: 98 F (36.7 C) 98.2 F (36.8 C) 98.6 F (37 C) 98.4 F (36.9 C)  TempSrc: Oral Oral Oral Oral  SpO2: 100% 98% 97% 98%  Weight:   66.8 kg   Height:        Intake/Output Summary (Last 24 hours) at 04/09/2019 0733 Last data filed at 04/09/2019 [id number] Gross per 24 hour  Intake 2886.67 ml  Output 4425 ml  Net -1538.33 ml   Filed Weights   04/06/19 1500 04/06/19 2142 04/08/19 2051  Weight: 66 kg 67 kg 66.8 kg    Examination: Awake alert coherent no distress EOMI NCAT no focal deficit moving all 4 limbs equally tracks with eyes bilaterally Strength 5/5 major muscle groups reflexes are somewhat diminished in brachioradialis sensory is intact Abdomen is soft nontender no rebound no guarding no lower extremity edema    Scheduled Meds: . lidocaine (PF)  5 mL Other Once   Continuous Infusions: . acyclovir 335 mg (04/08/19 1332)  . lacosamide (VIMPAT) IV 50 mg (04/08/19 2141)  . lactated ringers 100 mL/hr at 04/08/19 2138     LOS: 4 days   Time spent: [place], MD [facility]

## 2019-04-09 NOTE — Consult Note (Signed)
Chief Complaint: Patient was seen in consultation today for rhabdomyolysis, renal failure   Referring Physician(s): Dr. Hollie Salk  Supervising Physician: Corrie Mckusick  Patient Status: [facility] - In-pt  History of Present Illness: [name] is a 48 y.o. male with history of partial seizures, cannabis use, cirrhosis admitted with confusion at home.  Patient found to have metabolic encephalopathy with acute renal failure, elevated LFTs, rhabdomyolysis.  Multiple teams/specialists involved in determining etiology of his current presentation.  Since admission, his renal failure has not improved and he is in need of dialysis. Request made for temporary vs. Tunneled dialysis catheter placement.   Patient assessed at bedside.  He is confused, but does tell me his name and that he has a headache.  Does not remember that he is to start dialysis.  When asked if there is anyone who helps him with his medical decision he states "my sister, [name] and spoke with [name] re: catheter placement.  Patient has been NPO today.  INR 1.1 12/28  Past Medical History:  Diagnosis Date  . Cirrhosis ([place])   . Seizures ([place])     History reviewed. No pertinent surgical history.  Allergies: Penicillins and Sulfur  Medications: Prior to Admission medications   Medication Sig Start Date End Date Taking? Authorizing Provider  diphenhydrAMINE (BENADRYL) 25 MG tablet Take 25 mg by mouth 2 (two) times daily as needed for itching or sleep.   Yes [provider]  fexofenadine (ALLEGRA) 180 MG tablet Take 180 mg by mouth daily as needed for allergies or rhinitis.   Yes [provider]  ondansetron (ZOFRAN ODT) 4 MG disintegrating tablet Take 1 tablet (4 mg total) by mouth every 8 (eight) hours as needed for nausea or vomiting. Patient not taking: Reported on 04/05/2019 11/20/17   Kinnie Feil, PA-C     Family History  Problem Relation Age of Onset  . Diabetes Mother   . Diabetes  Sister     Social History   Socioeconomic History  . Marital status: Single    Spouse name: Not on file  . Number of children: Not on file  . Years of education: Not on file  . Highest education level: Not on file  Occupational History  . Not on file  Tobacco Use  . Smoking status: Never Smoker  . Smokeless tobacco: Never Used  Substance and Sexual Activity  . Alcohol use: No  . Drug use: Yes    Types: Marijuana  . Sexual activity: Not on file  Other Topics Concern  . Not on file  Social History Narrative  . Not on file   Social Determinants of Health   Financial Resource Strain:   . Difficulty of Paying Living Expenses: Not on file  Food Insecurity:   . Worried About Charity fundraiser in the Last Year: Not on file  . Ran Out of Food in the Last Year: Not on file  Transportation Needs:   . Lack of Transportation (Medical): Not on file  . Lack of Transportation (Non-Medical): Not on file  Physical Activity:   . Days of Exercise per Week: Not on file  . Minutes of Exercise per Session: Not on file  Stress:   . Feeling of Stress : Not on file  Social Connections:   . Frequency of Communication with Friends and Family: Not on file  . Frequency of Social Gatherings with Friends and Family: Not on file  . Attends Religious Services: Not on  file  . Active Member of Clubs or Organizations: Not on file  . Attends Archivist Meetings: Not on file  . Marital Status: Not on file     Review of Systems: A 12 point ROS discussed and pertinent positives are indicated in the HPI above.  All other systems are negative.  Review of Systems  Unable to perform ROS: Mental status change    Vital Signs: BP (!) 169/96 (BP Location: Left Arm)   Pulse 66   Temp 98.6 F (37 C) (Oral)   Resp 16   Ht 5\' 8"  (1.727 m)   Wt 147 lb 4.8 oz (66.8 kg)   SpO2 98%   BMI 22.40 kg/m   Physical Exam Vitals and nursing note reviewed.  Constitutional:      General: He is not  in acute distress.    Appearance: Normal appearance. He is ill-appearing.  HENT:     Mouth/Throat:     Mouth: Mucous membranes are moist.     Pharynx: Oropharynx is clear.  Cardiovascular:     Rate and Rhythm: Normal rate and regular rhythm.  Pulmonary:     Effort: Pulmonary effort is normal. No respiratory distress.     Breath sounds: Normal breath sounds.  Musculoskeletal:     Cervical back: Normal range of motion and neck supple.  Skin:    General: Skin is warm and dry.  Neurological:     General: No focal deficit present.     Mental Status: He is alert and oriented to person, place, and time. Mental status is at baseline.  Psychiatric:        Mood and Affect: Mood normal.        Behavior: Behavior normal.        Thought Content: Thought content normal.        Judgment: Judgment normal.      MD Evaluation Airway: WNL Heart: WNL Abdomen: WNL Chest/ Lungs: WNL ASA  Classification: 3 Mallampati/Airway Score: One   Imaging: EEG  Result Date: 04/07/2019 Lora Havens, MD     04/07/2019  1:53 PM Patient Name: [name] MRN: [id number] Epilepsy Attending: Lora Havens Referring Physician/Provider: Dr. Cristal Ford Date: 04/07/2019 Duration: 26.30 minutes Patient history: 48 year old male with prior history of seizure disorder who presented with altered mental status.  EEG to evaluate for seizures. Level of alertness: awake AEDs during EEG study: None Technical aspects: This EEG study was done with scalp electrodes positioned according to the 10-20 International system of electrode placement. Electrical activity was acquired at a sampling rate of 500Hz  and reviewed with a high frequency filter of 70Hz  and a low frequency filter of 1Hz . EEG data were recorded continuously and digitally stored. Description: During awake state, no clear posterior dominant rhythm was seen.  EEG showed continuous rhythmic 2 to 3 Hz delta activity in right temporoparietal region, which at  times appeared sharply contoured.  There was also continuous low amplitude 2 to 5 Hz theta - delta slowing as well as intermittent rhythmic 2 to 3 Hz delta slowing seen in left hemisphere.  Physiologic photic driving was not seen during photic stimulation.  No EEG change was seen during hyperventilation other than continuous right temporoparietal delta slowing. Abnormality -Continuous rhythmic delta slowing, right temporoparietal region -Continuous slow, left hemisphere IMPRESSION: This study is suggestive of cortical dysfunction in the right temporoparietal region, likely secondary to underlying abnormality.  There is also evidence of cortical dysfunction in left hemisphere, nonspecific to etiology.  No seizures or definite epileptiform discharges were seen throughout the recording. However, only wakefulness was recorded. If suspicion for interictal activity remains a concern, a prolonged study including sleep should be considered.   CT HEAD WO CONTRAST  Result Date: 04/09/2019 CLINICAL DATA:  Cephalopathy EXAM: CT HEAD WITHOUT CONTRAST TECHNIQUE: Contiguous axial images were obtained from the base of the skull through the vertex without intravenous contrast. COMPARISON:  04/05/2019 FINDINGS: Brain: No evidence of acute infarction, hemorrhage, hydrocephalus, extra-axial collection or mass lesion/mass effect. Vascular: No hyperdense vessel or unexpected calcification. Skull: Normal. Negative for fracture or focal lesion. Sinuses/Orbits: No acute finding. Other: None. IMPRESSION: No acute intracranial abnormality is noted. The overall appearance is similar to that seen on the prior exam. Electronically Signed   By: Inez Catalina M.D.   On: 04/09/2019 14:42   CT Head Wo Contrast  Result Date: 04/05/2019 CLINICAL DATA:  Altered mental status. EXAM: CT HEAD WITHOUT CONTRAST TECHNIQUE: Contiguous axial images were obtained from the base of the skull through the vertex without intravenous contrast. COMPARISON:   09/08/2007 FINDINGS: Brain: There is no evidence for acute hemorrhage, hydrocephalus, mass lesion, or abnormal extra-axial fluid collection. No definite CT evidence for acute infarction. Vascular: No hyperdense vessel or unexpected calcification. Skull: No evidence for fracture. No worrisome lytic or sclerotic lesion. Sinuses/Orbits: The visualized paranasal sinuses and mastoid air cells are clear. Visualized portions of the globes and intraorbital fat are unremarkable. Other: None. IMPRESSION: 1. Unremarkable CT head.  No acute intracranial abnormality. Electronically Signed   By: Misty Stanley M.D.   On: 04/05/2019 13:12   MR BRAIN WO CONTRAST  Addendum Date: 04/07/2019   ADDENDUM REPORT: 04/07/2019 13:48 ADDENDUM: These results were called by telephone at the time of interpretation on 04/07/2019 at 1:48 pm to provider [facility] , who verbally acknowledged these results. Electronically Signed   By: Kellie Simmering DO   On: 04/07/2019 13:48   Result Date: 04/07/2019 CLINICAL DATA:  Seizure, chronic, history of trauma. Additional history provided: Altered mental status EXAM: MRI HEAD WITHOUT CONTRAST TECHNIQUE: Multiplanar, multiecho pulse sequences of the brain and surrounding structures were obtained without intravenous contrast. COMPARISON:  Head CT 04/05/2019, brain MRI 04/07/2007 FINDINGS: Brain: Intermittently motion degraded examination. There is diffusion weighted and T2/FLAIR hyperintensity within the medial temporal lobe/hippocampus as well as posteromedial left thalamus. There is mild associated parenchymal swelling. There is a small amount of SWI signal loss along the right parietal lobe likely reflecting chronic blood products (series 7, image 81). No focal parenchymal signal abnormality elsewhere within the brain. No midline shift or extra-axial fluid collection. Cerebral volume is normal for age. Vascular: Flow voids maintained within the proximal large arterial vessels. Skull and upper  cervical spine: No focal marrow lesion Sinuses/Orbits: Visualized orbits demonstrate no acute abnormality. Mild scattered paranasal sinus mucosal thickening. Right maxillary sinus mucous retention cysts. No significant mastoid effusion. IMPRESSION: Diffusion-weighted and T2/FLAIR hyperintense signal abnormality within the medial temporal lobe/hippocampus as well as posteromedial left thalamus. Mild associated parenchymal swelling. 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 MRI follow-up is recommended following seizure control to ensure resolution of signal abnormality and to exclude alternative etiologies (i.e. neoplasm) Chronic blood products along the right parietal lobe, possibly posttraumatic. Electronically Signed: By: Kellie Simmering DO On: 04/07/2019 13:35   US Abdomen Complete  Result Date: 04/05/2019 CLINICAL DATA:  48 year old male with acute renal failure. EXAM: ABDOMEN ULTRASOUND COMPLETE COMPARISON:  CT of the abdomen  pelvis dated 11/20/2017. FINDINGS: Gallbladder: No gallstones or wall thickening visualized. No sonographic Murphy sign noted by sonographer. Common bile duct: Diameter: 5 mm Liver: There is diffuse increased liver echogenicity most commonly seen in the setting of fatty infiltration. Superimposed inflammation or fibrosis is not excluded. Clinical correlation is recommended. A 1 cm hypoechoic focus within the liver is not characterized but may represent an area of fat sparing or correspond to the hypoechoic structure seen on the CT of 11/20/2017. Portal vein is patent on color Doppler imaging with normal direction of blood flow towards the liver. IVC: No abnormality visualized. Pancreas: Visualized portion unremarkable. Spleen: Size and appearance within normal limits. Right Kidney: Length: 12 cm. Normal echogenicity. No hydronephrosis or shadowing stone. Left Kidney: Length: 12 cm. Normal echogenicity. No  hydronephrosis or shadowing stone. Abdominal aorta: No aneurysm visualized. Other findings: None. IMPRESSION: 1. Fatty liver. 2. No gallstone. Electronically Signed   By: Anner Crete M.D.   On: 04/05/2019 20:54   Overnight EEG with video  Result Date: 04/08/2019 Lora Havens, MD     04/08/2019 10:59 AM Patient Name: [name] MRN: [id number] Epilepsy Attending: Lora Havens Referring Physician/Provider: Dr. Zeb Comfort Duration:  04/07/2019 1528 to 04/08/2019 0918 Patient history: 48 year old male with prior history of seizure disorder who presented with altered mental status.  EEG to evaluate for seizures.  Level of alertness: awake, asleep  AEDs during EEG study:  Vimpat  Technical aspects: This EEG study was done with scalp electrodes positioned according to the 10-20 International system of electrode placement. Electrical activity was acquired at a sampling rate of 500Hz  and reviewed with a high frequency filter of 70Hz  and a low frequency filter of 1Hz . EEG data were recorded continuously and digitally stored.  Description: During awake state, no clear posterior dominant rhythm was seen.    Sleep was characterized by vertex waves, sleep spindles (12 to 14 Hz), maximal frontocentral.  EEG showed continuous rhythmic 2 to 3 Hz delta activity in right temporoparietal region, which at times appeared sharply contoured.  There was also continuous low amplitude 2 to 5 Hz theta - delta slowing in left hemisphere as well as intermittent rhythmic generalized 2 to 3 Hz delta slowing.   Abnormality -Continuous rhythmic delta slowing, right temporoparietal region -Continuous slow, left hemisphere -Intermittent rhythmic slow, generalized  IMPRESSION: This study is suggestive of cortical dysfunction in the right temporoparietal region, likely secondary to underlying abnormality.  There is also evidence of cortical dysfunction in left hemisphere, nonspecific to etiology.    Additionally, there is  evidence of mild to moderate diffuse encephalopathy.  No seizures or definite epileptiform discharges were seen throughout the recording. [place]:  CBC: Recent Labs    04/05/19 1532 04/06/19 0426 04/08/19 0530  WBC 11.9* 10.4 9.4  HGB 15.7 13.6 12.4*  HCT 48.3 40.6 36.6*  PLT 307 244 229    COAGS: Recent Labs    04/06/19 1633  INR 1.1  APTT 28    BMP: Recent Labs    04/07/19 0538 04/07/19 1453 04/08/19 0530 04/09/19 0553  NA 139 139 140 142  K 3.9 3.6 4.3 4.5  CL 100 96* 98 99  CO2 24 30 26 22   GLUCOSE 183* 122* 97 112*  BUN 146* 145* 138* 137*  CALCIUM 7.9* 8.0* 8.3* 8.7*  CREATININE 10.50* 10.87* 11.17* 11.03*  GFRNONAA 5* 5* 5* 5*  GFRAA 6* 6* 6* 6*    LIVER FUNCTION TESTS: Recent  Labs    04/05/19 1532 04/06/19 0426 04/06/19 1633 04/07/19 0538 04/08/19 0530 04/09/19 0553  BILITOT 0.7 0.8 0.9 0.7  --   --   AST 222* 163* 126* 89*  --   --   ALT 156* 114* 102* 75*  --   --   ALKPHOS 67 51 50 41  --   --   PROT 7.7 5.7* 5.4* 4.6*  --   --   ALBUMIN 4.1 3.3*  3.3* 2.9* 2.4*  2.4* 2.3* 2.4*    TUMOR MARKERS: No results for input(s): AFPTM, CEA, CA199, CHROMGRNA in the last 8760 hours.  Assessment and Plan: Rhabdomyolysis, acute renal failure Patient with ongoing renal failure, not improved since admission.  Encephalopathy persists.  IR consulted for dialysis catheter placement.  Patient has been NPO.  INR 1.1 12/28 No heparin since 12/29.  Spoke with sister [name] who provides consent given his confusion.  Plan to proceed with dialysis catheter placement as able based on schedule.   Risks and benefits discussed with the patient including, but not limited to bleeding, infection, vascular injury, pneumothorax which may require chest tube placement, air embolism or even death  All of the patient's questions were answered, patient is agreeable to proceed. Consent signed and in chart.  Thank you for this interesting consult.  I  greatly enjoyed meeting [name] and look forward to participating in their care.  A copy of this report was sent to the requesting provider on this date.  Electronically Signed: Docia Barrier, PA 04/09/2019, 3:58 PM   I spent a total of 40 Minutes    in face to face in clinical consultation, greater than 50% of which was counseling/coordinating care for renal failure.

## 2019-04-09 NOTE — Progress Notes (Addendum)
Reason for consult: Altered mental status  Subjective: No acute events overnight.  I spoke with patient's Sister [name] yesterday who states patient has had trouble recognizing her.  This morning, patient complaining of severe diffuse headache.  Appears drowsy.   ROS: negative except above  Examination  Vital signs in last 24 hours: Temp:  [98.2 F (36.8 C)-98.6 F (37 C)] 98.6 F (37 C) (12/31 0916) Pulse Rate:  [56-74] 66 (12/31 0916) Resp:  [12-18] 16 (12/31 0916) BP: (163-169)/(91-96) 169/96 (12/31 0916) SpO2:  [97 %-98 %] 98 % (12/31 0916) Weight:  [66.8 kg] 66.8 kg (12/30 2051)  General: lying in bed, NAD CVS: pulse-normal rate and rhythm RS: breathing comfortably Extremities: normal   Neuro: MS: Opens eyes to verbal stimuli, follows simple commands CN: pupils equal and reactive,  EOMI, face symmetric, tongue midline, normal sensation over face Motor: 4/5 strength in all 4 extremities Reflexes: 2+ bilaterally over patella, biceps, plantars: flexor Coordination: normal Gait: not tested  Basic Metabolic Panel: Recent Labs  Lab 04/06/19 0426 04/06/19 1633 04/07/19 0538 04/07/19 1453 04/08/19 0530 04/09/19 0553  NA 136 135 139 139 140 142  K 5.4* 5.2* 3.9 3.6 4.3 4.5  CL 105 101 100 96* 98 99  CO2 15* 19* 24 30 26 22   GLUCOSE 147* 115* 183* 122* 97 112*  BUN 148* 159* 146* 145* 138* 137*  CREATININE 10.42* 11.58* 10.50* 10.87* 11.17* 11.03*  CALCIUM 8.5* 8.6* 7.9* 8.0* 8.3* 8.7*  PHOS 6.2*  --  5.4*  --  6.8* 6.9*    CBC: Recent Labs  Lab 04/05/19 1532 04/06/19 0426 04/08/19 0530  WBC 11.9* 10.4 9.4  HGB 15.7 13.6 12.4*  HCT 48.3 40.6 36.6*  MCV 80.6 79.1* 78.2*  PLT 307 244 229     Coagulation Studies: Recent Labs    04/06/19 1633  LABPROT 13.6  INR 1.1    Imaging MRI brain without contrast 04/07/2019:Diffusion-weighted and T2/FLAIR hyperintense signal abnormality within the medial temporal lobe/hippocampus as well as posteromedial left  thalamus. Mild associated parenchymal swelling. 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 MRI follow-up is recommended following seizure control to ensure resolution of signal abnormality and to exclude alternative etiologies (i.e. neoplasm).  Chronic blood products along the right parietal lobe, possibly posttraumatic.   ASSESSMENT/PLAN:48 year old male with history of seizure disorder and marijuana use who presented with altered mental status for about a week,acute renal failure and elevated CK.   Acute encephalopathy Uremia Transaminitis -Patient presented with altered mental status and acute renal failure.  Patient has had seizures in the past but his last seizure was in 2007.  Would be atypical for patient to be in nonconvulsive status epilepticus for days which could then lead to elevated CK and renal failure.  It is still unclear why patient had abrupt onset of acute renal failure. There is a possibility that patient knowingly or unknowingly ingested illicit substance that may not show up on urine drug screen which may have led to acute renal failure. -Likely etiology for encephalopathy includes uremia, possible meningitis (HSV, VZV pending), versus autoimmune meningitis/encephalitis.    Recommendations -As patient appears more lethargic today and also has persistent headache, will order CT head without contrast to look for any acute abnormality -We will also order ANA, antidsDNA and anti-Jo antibody to look for lupus as one of the etiologies for possible meningitis/encephalitis as well as acute renal failure. -Continue Vimpat 50 mg twice daily.If needed, this can be  increased.Avoiding Keppra due to poor renal function. -CSF showed only 3 white blood cells.  Low suspicion for infection at this point.  However continue empiric treatment with acyclovir until HSV PCR comes back negative  -Per nephrology,  plan for dialysis.  We will wait to see if patient's mental status improves after dialysis. -If encephalopathy persists, we will consider empiric steroid treatment. -Continue seizure precautions -As needed IV Ativan 2 mg for generalized tonic-clonic seizure lasting more than 2 minutes or focal seizure lasting more than 5 minutes -Continue management of other comorbidities per primary team  Thank you for allowing [place] to participate in the care of this patient. Neurology will follow. Please page neuro hospitalist for any further questions after 5 PM.  I have spent a total of 53 minuteswith the patient reviewing hospitalnotes,  test results, labs and examining the patient as well as establishing an assessment and plan that was discussed personally with the patient and family.>50% of time was spent in direct patient care.

## 2019-04-10 ENCOUNTER — Inpatient Hospital Stay (HOSPITAL_COMMUNITY)
Admit: 2019-04-10 | Discharge: 2019-04-10 | Disposition: A | Discharge status: Home / Self Care | Payer: Medicaid Other | Attending: Neurology | Admitting: Neurology

## 2019-04-10 ENCOUNTER — Inpatient Hospital Stay (HOSPITAL_COMMUNITY): Payer: Medicaid Other

## 2019-04-10 DIAGNOSIS — G40901 Epilepsy, unspecified, not intractable, with status epilepticus: Secondary | ICD-10-CM

## 2019-04-10 HISTORY — PX: IR FLUORO GUIDE CV LINE RIGHT: IMG2283

## 2019-04-10 HISTORY — PX: IR US GUIDE VASC ACCESS RIGHT: IMG2390

## 2019-04-10 LAB — RENAL FUNCTION PANEL
Albumin: 2.5 g/dL — ABNORMAL LOW (ref 3.5–5.0)
Anion gap: 17 — ABNORMAL HIGH (ref 5–15)
BUN: 127 mg/dL — ABNORMAL HIGH (ref 6–20)
CO2: 22 mmol/L (ref 22–32)
Calcium: 8.6 mg/dL — ABNORMAL LOW (ref 8.9–10.3)
Chloride: 99 mmol/L (ref 98–111)
Creatinine, Ser: 10.16 mg/dL — ABNORMAL HIGH (ref 0.61–1.24)
GFR calc Af Amer: 6 mL/min — ABNORMAL LOW (ref >60)
GFR calc non Af Amer: 5 mL/min — ABNORMAL LOW (ref >60)
Glucose, Bld: 147 mg/dL — ABNORMAL HIGH (ref 70–99)
Phosphorus: 6.8 mg/dL — ABNORMAL HIGH (ref 2.5–4.6)
Potassium: 4.6 mmol/L (ref 3.5–5.1)
Sodium: 138 mmol/L (ref 135–145)

## 2019-04-10 LAB — CBC
HCT: 34.6 % — ABNORMAL LOW (ref 39.0–52.0)
Hemoglobin: 11.8 g/dL — ABNORMAL LOW (ref 13.0–17.0)
MCH: 26.3 pg (ref 26.0–34.0)
MCHC: 34.1 g/dL (ref 30.0–36.0)
MCV: 77.2 fL — ABNORMAL LOW (ref 80.0–100.0)
Platelets: 277 10*3/uL (ref 150–400)
RBC: 4.48 MIL/uL (ref 4.22–5.81)
RDW: 12.6 % (ref 11.5–15.5)
WBC: 9.2 10*3/uL (ref 4.0–10.5)
nRBC: 0 % (ref 0.0–0.2)

## 2019-04-10 LAB — CULTURE, BLOOD (SINGLE)
Culture: NO GROWTH
Special Requests: ADEQUATE

## 2019-04-10 LAB — GLUCOSE, CAPILLARY: Glucose-Capillary: 159 mg/dL — ABNORMAL HIGH (ref 70–99)

## 2019-04-10 LAB — C-REACTIVE PROTEIN: CRP: 2.6 mg/dL — ABNORMAL HIGH (ref <1.0)

## 2019-04-10 LAB — SEDIMENTATION RATE: Sed Rate: 35 mm/hr — ABNORMAL HIGH (ref 0–16)

## 2019-04-10 LAB — VARICELLA-ZOSTER BY PCR: Varicella-Zoster, PCR: NEGATIVE

## 2019-04-10 LAB — MAGNESIUM: Magnesium: 2.1 mg/dL (ref 1.7–2.4)

## 2019-04-10 MED ORDER — MIDAZOLAM HCL 2 MG/2ML IJ SOLN
INTRAMUSCULAR | Status: DC | PRN
  Administered 2019-04-10: 1 mg via INTRAVENOUS

## 2019-04-10 MED ORDER — HEPARIN SODIUM (PORCINE) 1000 UNIT/ML IJ SOLN
INTRAMUSCULAR | Status: AC
  Filled 2019-04-10: qty 1

## 2019-04-10 MED ORDER — SODIUM CHLORIDE 0.9 % IV SOLN
300.0000 mg | INTRAVENOUS | Status: AC
  Administered 2019-04-10: 300 mg via INTRAVENOUS
  Filled 2019-04-10: qty 30

## 2019-04-10 MED ORDER — SODIUM CHLORIDE 0.9 % IV SOLN
100.0000 mg | Freq: Two times a day (BID) | INTRAVENOUS | Status: DC
  Administered 2019-04-10 – 2019-04-13 (×6): 100 mg via INTRAVENOUS
  Filled 2019-04-10 (×7): qty 10

## 2019-04-10 MED ORDER — LABETALOL HCL 5 MG/ML IV SOLN
10.0000 mg | INTRAVENOUS | Status: DC | PRN
  Administered 2019-04-11 – 2019-04-12 (×5): 10 mg via INTRAVENOUS
  Filled 2019-04-10 (×2): qty 4

## 2019-04-10 MED ORDER — LIDOCAINE HCL 1 % IJ SOLN
INTRAMUSCULAR | Status: AC
  Filled 2019-04-10: qty 20

## 2019-04-10 MED ORDER — SODIUM CHLORIDE 0.9 % IV SOLN
1000.0000 mg | INTRAVENOUS | Status: DC
  Administered 2019-04-10 – 2019-04-15 (×6): 1000 mg via INTRAVENOUS
  Filled 2019-04-10 (×6): qty 8

## 2019-04-10 MED ORDER — LIDOCAINE HCL (PF) 1 % IJ SOLN
INTRAMUSCULAR | Status: DC | PRN
  Administered 2019-04-10: 10 mL

## 2019-04-10 MED ORDER — FENTANYL CITRATE (PF) 100 MCG/2ML IJ SOLN
INTRAMUSCULAR | Status: AC
  Filled 2019-04-10: qty 4

## 2019-04-10 MED ORDER — LABETALOL HCL 5 MG/ML IV SOLN
5.0000 mg | Freq: Once | INTRAVENOUS | Status: DC
  Filled 2019-04-10: qty 4

## 2019-04-10 MED ORDER — LABETALOL HCL 5 MG/ML IV SOLN
5.0000 mg | Freq: Once | INTRAVENOUS | Status: AC
  Administered 2019-04-10: 5 mg via INTRAVENOUS

## 2019-04-10 MED ORDER — FENTANYL CITRATE (PF) 100 MCG/2ML IJ SOLN
INTRAMUSCULAR | Status: DC | PRN
  Administered 2019-04-10: 50 ug via INTRAVENOUS

## 2019-04-10 MED ORDER — SODIUM CHLORIDE 0.9 % IV SOLN: 1000.0000 mg | INTRAVENOUS | Status: DC

## 2019-04-10 MED ORDER — CLONIDINE HCL 0.2 MG PO TABS: 0.2000 mg | ORAL_TABLET | Freq: Once | ORAL | Status: DC

## 2019-04-10 MED ORDER — HYDRALAZINE HCL 20 MG/ML IJ SOLN
5.0000 mg | INTRAMUSCULAR | Status: DC | PRN
  Administered 2019-04-10 – 2019-04-11 (×3): 5 mg via INTRAVENOUS
  Filled 2019-04-10 (×2): qty 1

## 2019-04-10 MED ORDER — SODIUM CHLORIDE 0.9 % IV SOLN
75.0000 mg | Freq: Two times a day (BID) | INTRAVENOUS | Status: DC
  Administered 2019-04-10: 75 mg via INTRAVENOUS
  Filled 2019-04-10 (×4): qty 7.5

## 2019-04-10 MED ORDER — GELATIN ABSORBABLE 12-7 MM EX MISC
CUTANEOUS | Status: AC
  Filled 2019-04-10: qty 1

## 2019-04-10 MED ORDER — MIDAZOLAM HCL 2 MG/2ML IJ SOLN
INTRAMUSCULAR | Status: AC
  Filled 2019-04-10: qty 6

## 2019-04-10 NOTE — Progress Notes (Signed)
Hospitalist daily note   [name] [id number] DOB: Jul 18, 1970 DOA: 04/05/2019  PCP: Patient, No Pcp Per   Narrative:  49 year old remote history of recurrent partial seizures 2007, cannabis use Patient admitted for metabolic encephalopathy in the setting of acute renal failure with elevated LFTs as well as rhabdomyolysis-EEG ordered nephrology consulted as per neurologist On 04/10/2019 had 2 separate seizures early in the morning and then subsequently and neurology loaded him with high-dose Vimpat and start steroids Data Reviewed:  BUN/creatinine from admission 115/10.3-->138/11.17-->137/11.03, Anion gap 21 CK total 1720 CT head 12/27-unremarkable CT Angiotensin converting enzyme 16, TSH 2.065, B12 359, CSF cell count and differential showed 3 WBC yellow, Gram stain showed no organisms no WBC Ultrasound abdomen 12/27 pelvis showed fatty liver no gallstones MRI brain 12/29 hyperintense signal abnormality within the medial temporal lobe as well as posterior medial left thalamus-?  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  Recurrent seizures Vimpat loaded higher dose 75 every 12 EEG has been discontinued and was grossly normal 12/30 Previously had EEG which was deemed normal Long-term EEG started again on 1/1 Starting on steroids Solu-Medrol 1 g every 24 Ativan 2 mg as needed for seizures Rest as per neurology  Acute kidney injury 2/2 rhabdo Similar output over the last 24 hours but may be increasing Will need dialysis access however is having seizures so this may need to wait CK seems to  be trending downwards from a peak 14,000   Transaminitis Ultrasound liver fatty liver hepatitis panel normal other work-up normal-monitor trends  Hyperphosphatemia Elevated to 73-defer binders to renal  Marijuana use No other drugs positive on drug screen  HTN uncontrolled Blood pressures are brought up 170 start metoprolol low-dose 12.5 twice daily and can use labetalol 180   Subjective: Multiple seizures this morning Seen by neurology and they request transfer to progressive care He is not awake alert enough to give any history as he has just been loaded with the Vimpat EEG is being set up at this time  Consultants:   Nephrology  Neurology Procedures:   As above Antimicrobials:   None   Objective: Vitals:   04/10/19 0500 04/10/19 0922 04/10/19 1012 04/10/19 1037  BP: (!) 171/92 (!) 168/89 (!) 219/109 (!) 165/100  Pulse: 94 90  82  Resp: 19 20    Temp: 98.2 F (36.8 C) 98.4 F (36.9 C)    TempSrc: Oral Oral    SpO2: 97%   97%  Weight:      Height:        Intake/Output Summary (Last 24 hours) at 04/10/2019 1101 Last data filed at 04/10/2019 0936 Gross per 24 hour  Intake 1751.75 ml  Output 450 ml  Net 1301.75 ml   Filed Weights   04/06/19 1500 04/06/19 2142 04/08/19 2051  Weight: 66 kg 67 kg 66.8 kg    Examination: Sleepy eyes are closed EOMI Neck soft supple Abdomen soft nontender  no swelling Chest clinically clear no added sound no rales   Scheduled Meds: . Chlorhexidine Gluconate Cloth  6 each Topical Q0600  . labetalol  5 mg Intravenous Once  . lidocaine (PF)  5 mL Other Once  . metoprolol tartrate  12.5 mg Oral BID  Continuous Infusions: . sodium chloride    . sodium chloride    . lacosamide (VIMPAT) IV 300 mg (04/10/19 1100)  . lacosamide (VIMPAT) IV 75 mg (04/10/19 1016)  . lactated ringers 100 mL/hr at 04/10/19 0505  . methylPREDNISolone (SOLU-MEDROL) injection    . vancomycin       LOS: 5 days   Time spent: [place],  MD [facility]

## 2019-04-10 NOTE — Plan of Care (Signed)
LTM eeg reviewed till 1720. No seizures noted. EEG showed continuous generalized 2-5hz  theta-delta slowing suggestive of encephalopathy.   Please review final report for details.   Roczen Waymire Barbra Sarks

## 2019-04-10 NOTE — Progress Notes (Signed)
Hydralazine 5mg  IV given for BP 211/104.

## 2019-04-10 NOTE — Progress Notes (Signed)
Patient transferred to [facility] room 12 via stretcher.  No family present.   EEG in process.   Patient does not respond to verbal stimulus.   Withdraws from pain.  Sleeping soundly.

## 2019-04-10 NOTE — Progress Notes (Signed)
Paged on-call via amion Re: persistent HTN w/ 200s/100s after hydralazine and labetalol given IV per PRN orders.

## 2019-04-10 NOTE — Progress Notes (Addendum)
[facility] Progress Note    Assessment/ Plan:   1. Severe AKI secondary to rhabdomyolysis--> with evidence of uremia.  Need to start dialysis.  NPO order placed, IR order placed, appreciate assistance.  HD orders written as well.  Have d/w IR and currently pt on the schedule 1/1.  UA with no cells on admission- would doubt GN in this situation. 2. Mild hyperkalemia, resolved 3. Metabolic acidosis improved 4. Rhabdomyolysis-- etiology unclear-- atypical story for status epilepticus, ? If myositis involved-- will investigate 5. Seizure disorder-- increased seizures overnight, AEDs being adjusted by neuro.  Getting solumedrol as well for possible autoimmune etiology of encephalitis/ seizures and autoimmune workup pending 6. Confusional state, amnesia,  LP 04/08/19 which is largely negative.   7. Hyperuricemia, ? Related to rhabdo  Subjective:    Had a tonic-clonic seizure at 0500 and another one at 1000 am. AEDs being adjusted by neurology.  For [facility] placement today and first dialysis-- appreciate IR.  CT head yesterday without abnormality   Objective:   BP (!) 198/102 (BP Location: Right Arm)   Pulse (!) 103   Temp 98.6 F (37 C) (Axillary)   Resp (!) 21   Ht 5\' 8"  (1.727 m)   Wt 66.8 kg   SpO2 92%   BMI 22.40 kg/m   Intake/Output Summary (Last 24 hours) at 04/10/2019 1331 Last data filed at 04/10/2019 0936 Gross per 24 hour  Intake 1751.75 ml  Output 450 ml  Net 1301.75 ml   Weight change:   Physical Exam: Gen: appears ill CVS: RRR Resp: clear bilaterally no c/w/r Abd: soft  Ext: no LE edema Neuro: not responsive to voice, responds to noxious stimuli  Imaging: CT HEAD WO CONTRAST  Result Date: 04/09/2019 CLINICAL DATA:  Cephalopathy EXAM: CT HEAD WITHOUT CONTRAST TECHNIQUE: Contiguous axial images were obtained from the base of the skull through the vertex without intravenous contrast. COMPARISON:  04/05/2019 FINDINGS: Brain: No evidence of acute  infarction, hemorrhage, hydrocephalus, extra-axial collection or mass lesion/mass effect. Vascular: No hyperdense vessel or unexpected calcification. Skull: Normal. Negative for fracture or focal lesion. Sinuses/Orbits: No acute finding. Other: None. IMPRESSION: No acute intracranial abnormality is noted. The overall appearance is similar to that seen on the prior exam. Electronically Signed   By: Inez Catalina M.D.   On: 04/09/2019 14:42    Labs: BMET Recent Labs  Lab 04/06/19 0426 04/06/19 1633 04/07/19 0538 04/07/19 1453 04/08/19 0530 04/09/19 0553 04/10/19 0526  NA 136 135 139 139 140 142 138  K 5.4* 5.2* 3.9 3.6 4.3 4.5 4.6  CL 105 101 100 96* 98 99 99  CO2 15* 19* 24 30 26 22 22   GLUCOSE 147* 115* 183* 122* 97 112* 147*  BUN 148* 159* 146* 145* 138* 137* 127*  CREATININE 10.42* 11.58* 10.50* 10.87* 11.17* 11.03* 10.16*  CALCIUM 8.5* 8.6* 7.9* 8.0* 8.3* 8.7* 8.6*  PHOS 6.2*  --  5.4*  --  6.8* 6.9* 6.8*   CBC Recent Labs  Lab 04/05/19 1532 04/06/19 0426 04/08/19 0530 04/10/19 1038  WBC 11.9* 10.4 9.4 9.2  HGB 15.7 13.6 12.4* 11.8*  HCT 48.3 40.6 36.6* 34.6*  MCV 80.6 79.1* 78.2* 77.2*  PLT 307 244 229 277    Medications:    . Chlorhexidine Gluconate Cloth  6 each Topical Q0600  . labetalol  5 mg Intravenous Once  . lidocaine (PF)  5 mL Other Once  . metoprolol tartrate  12.5 mg Oral BID  Madelon Lips, MD 04/10/2019, 1:31 PM

## 2019-04-10 NOTE — Progress Notes (Signed)
Patient had a seizure this am MD an Neurologist assessed patient. Medication was given.  EEG monitoring in process. Call and gave report to RN. Will transfer patient.

## 2019-04-10 NOTE — Progress Notes (Addendum)
Subjective:  Patient had a generalized tonic-clonic seizure this morning around 5 AM.  I was again called around 10 AM by EEG technician that patient was having another seizure.  On arrival, patient was noted to have left gaze deviation, left upper extremity extended and hypertonic, patient unable to answer questions.  Patient was given 2 mg of IV Ativan after which he became drowsy but seizure resolved.  ROS: Unable to obtain due to poor mental status  Examination  Vital signs in last 24 hours: Temp:  [97.7 F (36.5 C)-98.6 F (37 C)] 98.6 F (37 C) (01/01 1250) Pulse Rate:  [66-103] 103 (01/01 1250) Resp:  [16-21] 21 (01/01 1250) BP: (148-219)/(89-109) 198/102 (01/01 1250) SpO2:  [92 %-100 %] 92 % (01/01 1250)  General: lying in bed, not in apparent distress CVS: pulse-normal rate and rhythm RS: breathing comfortably, on room air Extremities: normal   Neuro: MS: Drowsy, not following commands CN: pupils equal and reactive, initially patient had left gaze deviation, after administration of Ativan patient was drowsy but gaze deviation had resolved.  No apparent facial asymmetry.   Motor: Initially patient had increased tone in left upper extremity which resolved after Ativan.  Following Ativan, patient was flaccid, withdrawing to noxious stimuli and moving all 4 extremities    Basic Metabolic Panel: Recent Labs  Lab 04/06/19 0426 04/07/19 0538 04/07/19 1453 04/08/19 0530 04/09/19 0553 04/10/19 0526 04/10/19 1038  NA 136 139 139 140 142 138  --   K 5.4* 3.9 3.6 4.3 4.5 4.6  --   CL 105 100 96* 98 99 99  --   CO2 15* 24 30 26 22 22   --   GLUCOSE 147* 183* 122* 97 112* 147*  --   BUN 148* 146* 145* 138* 137* 127*  --   CREATININE 10.42* 10.50* 10.87* 11.17* 11.03* 10.16*  --   CALCIUM 8.5* 7.9* 8.0* 8.3* 8.7* 8.6*  --   MG  --   --   --   --   --   --  2.1  PHOS 6.2* 5.4*  --  6.8* 6.9* 6.8*  --     CBC: Recent Labs  Lab 04/05/19 1532 04/06/19 0426 04/08/19 0530  04/10/19 1038  WBC 11.9* 10.4 9.4 9.2  HGB 15.7 13.6 12.4* 11.8*  HCT 48.3 40.6 36.6* 34.6*  MCV 80.6 79.1* 78.2* 77.2*  PLT 307 244 229 277     Coagulation Studies: No results for input(s): LABPROT, INR in the last 72 hours.  Imaging MRI brain without contrast 04/07/2019:Diffusion-weighted and T2/FLAIR hyperintense signal abnormality within the medial temporal lobe/hippocampus as well as posteromedial left thalamus. Mild associated parenchymal swelling. 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 MRI follow-up is recommended following seizure control to ensure resolution of signal abnormality and to exclude alternative etiologies (i.e. neoplasm). Chronic blood products along the right parietal lobe, possibly Posttraumatic.  CT head without contrast 04/09/2019: No acute abnormalities.   ASSESSMENT/PLAN:49 year old male with history of seizure disorder and marijuana use who presented with altered mental status for about a week,acute renal failure and elevated CK.   Acute encephalopathy Uremia Transaminitis Focal motor convulsive status epilepticus -Patient presented with altered mental status and acute renal failure.  Patient has had seizures in the past but his last seizure was in 2007.  Would be atypical for patient to be in nonconvulsive status epilepticus for days which could then lead to elevated CK and renal failure.  It is still  unclear why patient had abrupt onset of acute renal failure. There is a possibility that patient knowingly or unknowingly ingested illicit substance that may not show up on urine drug screen which may have led to acute renal failure. -CSF showed 3 white cells, protein 53, glucose 62, HSV PCR negative, VZV PCR negative, ACE enzyme 16.  -Patient had focal motor convulsive status epilepticus which resolved after Ativan and loading dose of Vimpat.  Recommendations -Patient  given loading dose of Vimpat 300 mg and increase maintenance dose to 100 mg twice daily.  Avoiding Keppra due to poor renal function. 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  Will monitor closely.  If needed, will need transfer to ICU. - ANA, antidsDNA and anti-Jo antibody to look for lupus as one of the etiologies for possible meningitis/encephalitis as well as acute renal failure pending -Awaiting dialysis per nephrology -Continue seizure precautions -As needed IV Ativan 2 mg for generalized tonic-clonic seizure lasting more than 2 minutes or focal seizure lasting more than 5 minutes -Continue management of other comorbidities per primary team  Thank you for allowing [place] to participate in the care of this patient. Neurology will follow. Please page neuro hospitalist for any further questions after 5 PM.  CRITICAL CARE Performed by: Lora Havens   Total critical care time: 35 minutes  Critical care time was exclusive of separately billable procedures and treating other patients.  Critical care was necessary to treat or prevent imminent or life-threatening deterioration.  Critical care was time spent personally by me on the following activities: development of treatment plan with patient and/or surrogate as well as nursing, discussions with consultants, evaluation of patient's response to treatment, examination of patient, obtaining history from  patient or surrogate, ordering and performing treatments and interventions, ordering and review of laboratory studies, ordering and review of radiographic studies, pulse oximetry and re-evaluation of patient's condition.

## 2019-04-10 NOTE — Procedures (Signed)
ESRD  S/p RT IJ HD CATH  Tip svcra No comp Stable ebl min Ready for use Full report in pacs

## 2019-04-10 NOTE — Progress Notes (Signed)
vLTM EEG started. No skin breakdown. Tested event button. Notified Neuro. Monitored by [facility]

## 2019-04-10 NOTE — Progress Notes (Signed)
Patient had tonic clonic seizure right after he was given a bath. Head turned to left and eyes were fixed, left arm with jerking movement. Seizure lasted less than 30 seconds. By the time another RN brought ativan,patient had already stopped seizing and patient was responsive. VS taken. Will continue to monitor. Floella Ensz, Wonda Cheng, Therapist, sports

## 2019-04-10 NOTE — Progress Notes (Signed)
Spoke with floor RN Antonieta Pert  patient is still connected to EEG. Advised unable to bring up to unit for dialysis while still attached. Asked if she could let know when detached.

## 2019-04-11 DIAGNOSIS — G9341 Metabolic encephalopathy: Secondary | ICD-10-CM

## 2019-04-11 LAB — RENAL FUNCTION PANEL
Albumin: 2.8 g/dL — ABNORMAL LOW (ref 3.5–5.0)
Anion gap: 18 — ABNORMAL HIGH (ref 5–15)
BUN: 119 mg/dL — ABNORMAL HIGH (ref 6–20)
CO2: 21 mmol/L — ABNORMAL LOW (ref 22–32)
Calcium: 9 mg/dL (ref 8.9–10.3)
Chloride: 101 mmol/L (ref 98–111)
Creatinine, Ser: 8.82 mg/dL — ABNORMAL HIGH (ref 0.61–1.24)
GFR calc Af Amer: 7 mL/min — ABNORMAL LOW (ref >60)
GFR calc non Af Amer: 6 mL/min — ABNORMAL LOW (ref >60)
Glucose, Bld: 212 mg/dL — ABNORMAL HIGH (ref 70–99)
Phosphorus: 5.5 mg/dL — ABNORMAL HIGH (ref 2.5–4.6)
Potassium: 4.3 mmol/L (ref 3.5–5.1)
Sodium: 140 mmol/L (ref 135–145)

## 2019-04-11 LAB — CBC WITH DIFFERENTIAL/PLATELET
Abs Immature Granulocytes: 0.16 10*3/uL — ABNORMAL HIGH (ref 0.00–0.07)
Basophils Absolute: 0 10*3/uL (ref 0.0–0.1)
Basophils Relative: 0 %
Eosinophils Absolute: 0 10*3/uL (ref 0.0–0.5)
Eosinophils Relative: 0 %
HCT: 33.8 % — ABNORMAL LOW (ref 39.0–52.0)
Hemoglobin: 11.5 g/dL — ABNORMAL LOW (ref 13.0–17.0)
Immature Granulocytes: 1 %
Lymphocytes Relative: 9 %
Lymphs Abs: 1.4 10*3/uL (ref 0.7–4.0)
MCH: 26.7 pg (ref 26.0–34.0)
MCHC: 34 g/dL (ref 30.0–36.0)
MCV: 78.4 fL — ABNORMAL LOW (ref 80.0–100.0)
Monocytes Absolute: 0.5 10*3/uL (ref 0.1–1.0)
Monocytes Relative: 3 %
Neutro Abs: 13.7 10*3/uL — ABNORMAL HIGH (ref 1.7–7.7)
Neutrophils Relative %: 87 %
Platelets: 335 10*3/uL (ref 150–400)
RBC: 4.31 MIL/uL (ref 4.22–5.81)
RDW: 12.7 % (ref 11.5–15.5)
WBC: 15.7 10*3/uL — ABNORMAL HIGH (ref 4.0–10.5)
nRBC: 0 % (ref 0.0–0.2)

## 2019-04-11 LAB — EXTRACTABLE NUCLEAR ANTIGEN ANTIBODY
ENA SM Ab Ser-aCnc: <0.2 AI (ref 0.0–0.9)
Ribonucleic Protein: <0.2 AI (ref 0.0–0.9)
SSA (Ro) (ENA) Antibody, IgG: <0.2 AI (ref 0.0–0.9)
SSB (La) (ENA) Antibody, IgG: <0.2 AI (ref 0.0–0.9)
Scleroderma (Scl-70) (ENA) Antibody, IgG: <0.2 AI (ref 0.0–0.9)
ds DNA Ab: <1 IU/mL (ref 0–9)

## 2019-04-11 LAB — CSF CULTURE W GRAM STAIN
Culture: NO GROWTH
Gram Stain: NONE SEEN
Special Requests: NORMAL

## 2019-04-11 LAB — ANTI-JO 1 ANTIBODY, IGG: Anti JO-1: <0.2 AI (ref 0.0–0.9)

## 2019-04-11 LAB — VITAMIN B1: Vitamin B1 (Thiamine): 74.4 nmol/L (ref 66.5–200.0)

## 2019-04-11 MED ORDER — LORAZEPAM 2 MG/ML IJ SOLN
1.0000 mg | INTRAMUSCULAR | Status: DC | PRN
  Administered 2019-04-11: 1 mg via INTRAVENOUS

## 2019-04-11 MED ORDER — LORAZEPAM 2 MG/ML IJ SOLN
INTRAMUSCULAR | Status: AC
  Administered 2019-04-11: 1 mg via INTRAVENOUS
  Filled 2019-04-11: qty 1

## 2019-04-11 MED ORDER — LORAZEPAM 2 MG/ML IJ SOLN
INTRAMUSCULAR | Status: AC
  Filled 2019-04-11: qty 1

## 2019-04-11 MED ORDER — HYDRALAZINE HCL 20 MG/ML IJ SOLN
10.0000 mg | INTRAMUSCULAR | Status: DC | PRN
  Administered 2019-04-11 – 2019-04-13 (×6): 10 mg via INTRAVENOUS
  Filled 2019-04-11 (×5): qty 1

## 2019-04-11 MED ORDER — CLONIDINE HCL 0.2 MG/24HR TD PTWK
0.2000 mg | MEDICATED_PATCH | TRANSDERMAL | Status: DC
  Administered 2019-04-11: 0.2 mg via TRANSDERMAL
  Filled 2019-04-11: qty 1

## 2019-04-11 MED ORDER — HEPARIN SODIUM (PORCINE) 1000 UNIT/ML IJ SOLN
INTRAMUSCULAR | Status: AC
  Filled 2019-04-11: qty 3

## 2019-04-11 MED ORDER — LORAZEPAM 2 MG/ML IJ SOLN
2.0000 mg | INTRAMUSCULAR | Status: DC | PRN
  Administered 2019-04-12 – 2019-04-13 (×5): 2 mg via INTRAVENOUS
  Filled 2019-04-11 (×9): qty 1

## 2019-04-11 NOTE — Procedures (Addendum)
Patient Name:[name] [id number] Epilepsy Attending:Cypher Paule Barbra Sarks Referring Physician/Provider:Dr. Zeb Comfort Duration: 04/10/2019 1027  to 04/11/2019 1027  Patient history:49 year old male with prior history of seizure disorder who presented with altered mental status and was noted to have left sided focal motor seizures. EEG to evaluate for seizures.  Level of alertness:awake, asleep  AEDs during EEG study: Vimpat  Technical aspects: This EEG study was done with scalp electrodes positioned according to the 10-20 International system of electrode placement. Electrical activity was acquired at a sampling rate of 500Hz  and reviewed with a high frequency filter of 70Hz  and a low frequency filter of 1Hz . EEG data were recorded continuously and digitally stored.  Description: During awake state, no clear posterior dominant rhythm was seen.  Sleep was characterized by vertex waves, sleep spindles (12 to 14 Hz), maximal frontocentral.  EEG showed continuous generalized 3-6hz  theta-delta slowing. Triphasic waves at 1-1.5hz , generalized were noted at times, when patient was stimulated. Sharp transients were seen in right parieto-occipital region.  Abnormality -Continuous slow, generalized  - Triphasic waves, generalized  IMPRESSION: This study issuggestive of moderate to severe diffuse encephalopathy non specific to etiology but could be secondary to toxic-metabolic causes.  No seizures ordefiniteepileptiform discharges were seen throughout the recording.   [name] Barbra Sarks

## 2019-04-11 NOTE — Progress Notes (Signed)
Patient returned from initial dialysis treatment via bed.  Neuro tech in to reconnect EEG and straighten/reapply wires.

## 2019-04-11 NOTE — Progress Notes (Signed)
LTM maint complete - no skin breakdown under:  F8, M1, P7

## 2019-04-11 NOTE — Progress Notes (Signed)
[facility] Progress Note    Assessment/ Plan:   1. Severe AKI secondary to rhabdomyolysis--> with evidence of uremia.  Need to start dialysis.  S/p [facility] 04/10/2019.  HD today--> d/w dialysis staff.  UA with no cells on admission- would doubt GN in this situation. 2. Mild hyperkalemia, resolved 3. Metabolic acidosis improved 4. Rhabdomyolysis-- etiology unclear-- atypical story for status epilepticus, ? If myositis involved-- will investigate 5. Seizure disorder-- increased seizures overnight, AEDs being adjusted by neuro.  Getting solumedrol as well for possible autoimmune etiology of encephalitis/ seizures and autoimmune workup pending 6. Confusional state, amnesia,  LP 04/08/19 which is largely negative.   7. Hyperuricemia, ? Related to rhabdo  Subjective:    S/p TDC yesterday, HD not done overnight, no provider notified.   Objective:   BP (!) 215/73   Pulse 96   Temp 98.4 F (36.9 C) (Oral)   Resp 15   Ht 5\' 8"  (1.727 m)   Wt 72.2 kg   SpO2 96%   BMI 24.20 kg/m   Intake/Output Summary (Last 24 hours) at 04/11/2019 1030 Last data filed at 04/11/2019 0300 Gross per 24 hour  Intake 1685.67 ml  Output 2500 ml  Net -814.33 ml   Weight change:   Physical Exam: Gen: appears ill, on L side, responds to voice CVS: RRR Resp: clear bilaterally no c/w/r Abd: soft  Ext: no LE edema Neuro: responds to voice  Imaging: CT HEAD WO CONTRAST  Result Date: 04/09/2019 CLINICAL DATA:  Cephalopathy EXAM: CT HEAD WITHOUT CONTRAST TECHNIQUE: Contiguous axial images were obtained from the base of the skull through the vertex without intravenous contrast. COMPARISON:  04/05/2019 FINDINGS: Brain: No evidence of acute infarction, hemorrhage, hydrocephalus, extra-axial collection or mass lesion/mass effect. Vascular: No hyperdense vessel or unexpected calcification. Skull: Normal. Negative for fracture or focal lesion. Sinuses/Orbits: No acute finding. Other: None. IMPRESSION: No  acute intracranial abnormality is noted. The overall appearance is similar to that seen on the prior exam. Electronically Signed   By: Inez Catalina M.D.   On: 04/09/2019 14:42   IR Fluoro Guide CV Line Right  Result Date: 04/10/2019 INDICATION: End-stage renal disease, no current access EXAM: ULTRASOUND GUIDANCE FOR VASCULAR ACCESS RIGHT INTERNAL JUGULAR PERMANENT HEMODIALYSIS CATHETER Date:  04/10/2019 04/10/2019 3:00 pm Radiologist:  Dalton Mages. Daryll Brod, MD Guidance:  Ultrasound and fluoroscopic FLUOROSCOPY TIME:  Fluoroscopy Time: 0 minutes 36 seconds (1.0 mGy). MEDICATIONS: Vancomycin 1 g within 1 hour of the procedure ANESTHESIA/SEDATION: Versed 1.0 mg IV; Fentanyl 50 mcg IV; Moderate Sedation Time:  16 minutes The patient was continuously monitored during the procedure by the interventional radiology nurse under my direct supervision. CONTRAST:  None. COMPLICATIONS: None immediate. PROCEDURE: Informed consent was obtained from the patient following explanation of the procedure, risks, benefits and alternatives. The patient understands, agrees and consents for the procedure. All questions were addressed. A time out was performed. Maximal barrier sterile technique utilized including caps, mask, sterile gowns, sterile gloves, large sterile drape, hand hygiene, and 2% chlorhexidine scrub. Under sterile conditions and local anesthesia, right internal jugular micropuncture venous access was performed with ultrasound. Images were obtained for documentation of the patent right internal jugular vein. A guide wire was inserted followed by a transitional dilator. Next, a 0.035 guidewire was advanced into the IVC with a 5-French catheter. Measurements were obtained from the right venotomy site to the proximal right atrium. In the right infraclavicular chest, a subcutaneous tunnel was created under sterile conditions and local anesthesia. 1%  lidocaine with epinephrine was utilized for this. The 19 cm tip to cuff dialysis  catheter was tunneled subcutaneously to the venotomy site and inserted into the SVC/RA junction through a valved peel-away sheath. Position was confirmed with fluoroscopy. Images were obtained for documentation. Blood was aspirated from the catheter followed by saline and heparin flushes. The appropriate volume and strength of heparin was instilled in each lumen. Caps were applied. The catheter was secured at the tunnel site with Gelfoam and a pursestring suture. The venotomy site was closed with subcuticular Vicryl suture. Dermabond was applied to the small right neck incision. A dry sterile dressing was applied. The catheter is ready for use. No immediate complications. IMPRESSION: Ultrasound and fluoroscopically guided right internal jugular tunneled hemodialysis catheter (19 cm tip to cuff dialysis catheter). Electronically Signed   By: Dalton Mages.  Shick M.D.   On: 04/10/2019 15:03   IR US Guide Vasc Access Right  Result Date: 04/10/2019 INDICATION: End-stage renal disease, no current access EXAM: ULTRASOUND GUIDANCE FOR VASCULAR ACCESS RIGHT INTERNAL JUGULAR PERMANENT HEMODIALYSIS CATHETER Date:  04/10/2019 04/10/2019 3:00 pm Radiologist:  Dalton Mages. Daryll Brod, MD Guidance:  Ultrasound and fluoroscopic FLUOROSCOPY TIME:  Fluoroscopy Time: 0 minutes 36 seconds (1.0 mGy). MEDICATIONS: Vancomycin 1 g within 1 hour of the procedure ANESTHESIA/SEDATION: Versed 1.0 mg IV; Fentanyl 50 mcg IV; Moderate Sedation Time:  16 minutes The patient was continuously monitored during the procedure by the interventional radiology nurse under my direct supervision. CONTRAST:  None. COMPLICATIONS: None immediate. PROCEDURE: Informed consent was obtained from the patient following explanation of the procedure, risks, benefits and alternatives. The patient understands, agrees and consents for the procedure. All questions were addressed. A time out was performed. Maximal barrier sterile technique utilized including caps, mask, sterile gowns,  sterile gloves, large sterile drape, hand hygiene, and 2% chlorhexidine scrub. Under sterile conditions and local anesthesia, right internal jugular micropuncture venous access was performed with ultrasound. Images were obtained for documentation of the patent right internal jugular vein. A guide wire was inserted followed by a transitional dilator. Next, a 0.035 guidewire was advanced into the IVC with a 5-French catheter. Measurements were obtained from the right venotomy site to the proximal right atrium. In the right infraclavicular chest, a subcutaneous tunnel was created under sterile conditions and local anesthesia. 1% lidocaine with epinephrine was utilized for this. The 19 cm tip to cuff dialysis catheter was tunneled subcutaneously to the venotomy site and inserted into the SVC/RA junction through a valved peel-away sheath. Position was confirmed with fluoroscopy. Images were obtained for documentation. Blood was aspirated from the catheter followed by saline and heparin flushes. The appropriate volume and strength of heparin was instilled in each lumen. Caps were applied. The catheter was secured at the tunnel site with Gelfoam and a pursestring suture. The venotomy site was closed with subcuticular Vicryl suture. Dermabond was applied to the small right neck incision. A dry sterile dressing was applied. The catheter is ready for use. No immediate complications. IMPRESSION: Ultrasound and fluoroscopically guided right internal jugular tunneled hemodialysis catheter (19 cm tip to cuff dialysis catheter). Electronically Signed   By: Dalton Mages.  Shick M.D.   On: 04/10/2019 15:03   Overnight EEG with video  Result Date: 04/11/2019 Dalton Havens, MD     04/11/2019  8:18 AM Patient Name:[name] [id number] Epilepsy Attending:Priyanka Barbra [name] Referring Physician/Provider:Dr. Zeb [name] Duration: 04/10/2019 1027  to 04/11/2019 0800  Patient history:49 year old male with prior history of seizure  disorder who presented with altered  mental status and was noted to have left sided focal motor seizures. EEG to evaluate for seizures.  Level of alertness:awake, asleep  AEDs during EEG study: Vimpat  Technical aspects: This EEG study was done with scalp electrodes positioned according to the 10-20 International system of electrode placement. Electrical activity was acquired at a sampling rate of 500Hz  and reviewed with a high frequency filter of 70Hz  and a low frequency filter of 1Hz . EEG data were recorded continuously and digitally stored.  Description: During awake state, no clear posterior dominant rhythm was seen.  Sleep was characterized by vertex waves, sleep spindles (12 to 14 Hz), maximal frontocentral.  EEG showed continuous generalized 3-6hz  theta-delta slowing. Triphasic waves at 1-1.5hz , generalized were noted at times, when patient was stimulated. Sharp transients were seen in right parieto-occipital region. Abnormality -Continuous slow, generalized - Triphasic waves, generalized  IMPRESSION: This study issuggestive of moderate to severe diffuse encephalopathy non specific to etiology but could be secondary to toxic-metabolic causes.  No seizures ordefiniteepileptiform discharges were seen throughout the recording.  [name]    Labs: BMET Recent Labs  Lab 04/06/19 0426 04/06/19 1633 04/07/19 0538 04/07/19 1453 04/08/19 0530 04/09/19 0553 04/10/19 0526 04/11/19 0703  NA 136 135 139 139 140 142 138 140  K 5.4* 5.2* 3.9 3.6 4.3 4.5 4.6 4.3  CL 105 101 100 96* 98 99 99 101  CO2 15* 19* 24 30 26 22 22  21*  GLUCOSE 147* 115* 183* 122* 97 112* 147* 212*  BUN 148* 159* 146* 145* 138* 137* 127* 119*  CREATININE 10.42* 11.58* 10.50* 10.87* 11.17* 11.03* 10.16* 8.82*  CALCIUM 8.5* 8.6* 7.9* 8.0* 8.3* 8.7* 8.6* 9.0  PHOS 6.2*  --  5.4*  --  6.8* 6.9* 6.8* 5.5*   CBC Recent Labs  Lab 04/06/19 0426 04/08/19 0530 04/10/19 1038 04/11/19 0703  WBC 10.4 9.4 9.2 15.7*   NEUTROABS  --   --   --  13.7*  HGB 13.6 12.4* 11.8* 11.5*  HCT 40.6 36.6* 34.6* 33.8*  MCV 79.1* 78.2* 77.2* 78.4*  PLT 244 229 277 335    Medications:    . Chlorhexidine Gluconate Cloth  6 each Topical Q0600  . cloNIDine  0.2 mg Oral Once  . labetalol  5 mg Intravenous Once  . lidocaine (PF)  5 mL Other Once  . metoprolol tartrate  12.5 mg Oral BID      Dalton Lips, MD 04/11/2019, 10:30 AM

## 2019-04-11 NOTE — Progress Notes (Signed)
Neurology Progress Note   S:// Seen and examined. Very drowsy Did not get dialysis yesterday On LTM EEG-no seizures overnight   O:// Current vital signs: BP (!) 215/73   Pulse 96   Temp 98.4 F (36.9 C) (Oral)   Resp 15   Ht 5\' 8"  (1.727 m)   Wt 72.2 kg   SpO2 96%   BMI 24.20 kg/m  Vital signs in last 24 hours: Temp:  [98 F (36.7 C)-98.6 F (37 C)] 98.4 F (36.9 C) (01/02 0512) Pulse Rate:  [75-103] 96 (01/02 0512) Resp:  [12-79] 15 (01/02 0512) BP: (165-241)/(73-128) 215/73 (01/02 0534) SpO2:  [92 %-100 %] 96 % (01/02 0512) Weight:  [72.2 kg] 72.2 kg (01/02 0500) General: Laying in bed in no distress HEENT: Dry oral mucous membranes, extremely poor dentition, normocephalic Respiratory: Breathing well saturating normally on room air Cardiovascular: Regular rate rhythm Extremities warm well perfused without edema Neurologic exam Mental status: Sleeping, does not wake to voice, opens eyes to noxious stimulation.  Does not follow commands.  Nonverbal. Cranial nerves: Pupils equal round reactive to light, no gaze deviation or preference, does not blink to threat from either side, no obvious facial asymmetry. Motor exam: Moving all 4 spontaneously and withdrawing to noxious immolation. Sensory exam: As above  Medications  Current Facility-Administered Medications:  .  0.9 %  sodium chloride infusion, 100 mL, Intravenous, PRN, Samtani, Jai-Gurmukh, MD .  0.9 %  sodium chloride infusion, 100 mL, Intravenous, PRN, Nita Sells, MD .  acetaminophen (TYLENOL) tablet 650 mg, 650 mg, Oral, Q6H PRN, 650 mg at 04/09/19 0733 **OR** acetaminophen (TYLENOL) suppository 650 mg, 650 mg, Rectal, Q6H PRN, Samtani, Jai-Gurmukh, MD .  alteplase (CATHFLO ACTIVASE) injection 2 mg, 2 mg, Intracatheter, Once PRN, Nita Sells, MD .  Chlorhexidine Gluconate Cloth 2 % PADS 6 each, 6 each, Topical, Q0600, Samtani, Jai-Gurmukh, MD .  cloNIDine (CATAPRES) tablet 0.2 mg, 0.2 mg,  Oral, Once, Blount, Xenia T, NP .  heparin injection 1,000 Units, 1,000 Units, Dialysis, PRN, Nita Sells, MD .  hydrALAZINE (APRESOLINE) injection 10 mg, 10 mg, Intravenous, Q4H PRN, Verlon Au, Jai-Gurmukh, MD .  labetalol (NORMODYNE) injection 10 mg, 10 mg, Intravenous, Q2H PRN, Blount, Xenia T, NP, 10 mg at 04/11/19 0533 .  labetalol (NORMODYNE) injection 5 mg, 5 mg, Intravenous, Once, Samtani, Jai-Gurmukh, MD .  lacosamide (VIMPAT) 100 mg in sodium chloride 0.9 % 25 mL IVPB, 100 mg, Intravenous, Q12H, Lora Havens, MD, Last Rate: 70 mL/hr at 04/10/19 2158, 100 mg at 04/10/19 2158 .  lactated ringers infusion, , Intravenous, Continuous, Samtani, Jai-Gurmukh, MD, Last Rate: 100 mL/hr at 04/10/19 0505, New Bag at 04/10/19 0505 .  lidocaine (PF) (XYLOCAINE) 1 % injection 5 mL, 5 mL, Other, Once, Samtani, Jai-Gurmukh, MD .  lidocaine (PF) (XYLOCAINE) 1 % injection 5 mL, 5 mL, Intradermal, PRN, Samtani, Jai-Gurmukh, MD .  lidocaine (PF) (XYLOCAINE) 1 % injection, , , PRN, Greggory Keen, MD, 10 mL at 04/10/19 1454 .  lidocaine-prilocaine (EMLA) cream 1 application, 1 application, Topical, PRN, Samtani, Jai-Gurmukh, MD .  LORazepam (ATIVAN) injection 2 mg, 2 mg, Intravenous, PRN, Nita Sells, MD, 2 mg at 04/11/19 0254 .  methylPREDNISolone sodium succinate (SOLU-MEDROL) 1,000 mg in sodium chloride 0.9 % 50 mL IVPB, 1,000 mg, Intravenous, Q24H, Samtani, Jai-Gurmukh, MD, Last Rate: 58 mL/hr at 04/10/19 1122, 1,000 mg at 04/10/19 1122 .  metoprolol tartrate (LOPRESSOR) tablet 12.5 mg, 12.5 mg, Oral, BID, Samtani, Jai-Gurmukh, MD, 12.5 mg at 04/09/19 1939 .  midazolam (VERSED) injection, , Intravenous, PRN, Greggory Keen, MD, 1 mg at 04/10/19 1445 .  ondansetron (ZOFRAN) tablet 4 mg, 4 mg, Oral, Q6H PRN **OR** ondansetron (ZOFRAN) injection 4 mg, 4 mg, Intravenous, Q6H PRN, Verlon Au, Jai-Gurmukh, MD .  pentafluoroprop-tetrafluoroeth (GEBAUERS) aerosol 1 application, 1 application,  Topical, PRN, Nita Sells, MD .  traMADol (ULTRAM) tablet 50 mg, 50 mg, Oral, Q6H PRN, Nita Sells, MD, 50 mg at 04/09/19 1924 Labs CBC    Component Value Date/Time   WBC 15.7 (H) 04/11/2019 0703   RBC 4.31 04/11/2019 0703   HGB 11.5 (L) 04/11/2019 0703   HCT 33.8 (L) 04/11/2019 0703   PLT 335 04/11/2019 0703   MCV 78.4 (L) 04/11/2019 0703   MCH 26.7 04/11/2019 0703   MCHC 34.0 04/11/2019 0703   RDW 12.7 04/11/2019 0703   LYMPHSABS 1.4 04/11/2019 0703   MONOABS 0.5 04/11/2019 0703   EOSABS 0.0 04/11/2019 0703   BASOSABS 0.0 04/11/2019 0703    CMP     Component Value Date/Time   NA 140 04/11/2019 0703   K 4.3 04/11/2019 0703   CL 101 04/11/2019 0703   CO2 21 (L) 04/11/2019 0703   GLUCOSE 212 (H) 04/11/2019 0703   BUN 119 (H) 04/11/2019 0703   CREATININE 8.82 (H) 04/11/2019 0703   CALCIUM 9.0 04/11/2019 0703   PROT 4.6 (L) 04/07/2019 0538   ALBUMIN 2.8 (L) 04/11/2019 0703   AST 89 (H) 04/07/2019 0538   ALT 75 (H) 04/07/2019 0538   ALKPHOS 41 04/07/2019 0538   BILITOT 0.7 04/07/2019 0538   GFRNONAA 6 (L) 04/11/2019 0703   GFRAA 7 (L) 04/11/2019 0703    Imaging I have reviewed images in epic and the results pertinent to this consultation are: MRI of the brain suggestive of diffusion weighted and T2 hyperintense signal abnormality in the mesial temporal lobe and hippocampus on the left as well as posterior medial left thalamus-question HSV encephalitis autoimmune encephalitis possibly some seizure edema. Follow-up MRI recommended  CSF studies with 3 white cells, protein 53, glucose 62, 1000 RBCs.  HSV PCR negative.  VZV negative.  Angiotensin-converting enzyme 16.  Assessment: 49 year old man with a history of seizure disorder and marijuana use presented with altered mental status for 1 week, sudden onset acute renal failure and elevated CK. There is no clear explanation for his deranged renal function.  Awaiting dialysis.  Impression: Toxic metabolic  encephalopathy Seizures Focal motor convulsive status epilepticus-resolved Abnormal brain MRI findings   Recommendations: HD per nephrology-it is okay to take him to dialysis with the EEG leads.  Please call if we can be of assistance in facilitating transfer/any other help that we can provide with the EEG.  Our technologist will be available as needed. Continue Vimpat 100 twice daily.  Can increase to 150 twice daily if seizures. 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 Close monitoring for any evidence of respiratory depression that might need transfer to ICU. Autoimmune work-up with ANA, antidouble-stranded DNA and anti-Jo antibodies pending Management of other toxic metabolic derangements per primary team as you are Consider repeat brain imaging after steroids  Discussed plan with the charge RN on the floor.  -- Amie Portland, MD [facility] Pager: [phone] If 7pm to 7am, please call on call as listed on AMION.

## 2019-04-11 NOTE — Progress Notes (Signed)
Paged [place] on-call provider to discuss pt continued HTN, inability to follow commands to take PO meds, possibility for need for restraints d/t pulling lines/tubes, etc.

## 2019-04-11 NOTE — Progress Notes (Signed)
Patient overly restless during HD and twisting around w/ blood lines and EEG wires around his neck. Not responsive to commands. Plan restraints +/- IV ativan to get him through the 2.5 hr Rx.   Kelly Splinter, MD 04/11/2019, 3:56 PM

## 2019-04-11 NOTE — Plan of Care (Signed)
LTM EEG reviewed till 54. EEG now showing generalized periodic epileptiform discharges with triphasic morphology at 2Hz . No clear evolution is seen. DR Rory Percy notified to examine patient and consider giving 2gm IV ativan to help differentiate if this is ictal or interictal.   [name]

## 2019-04-11 NOTE — Progress Notes (Signed)
Rapid Response Follow-up  BP 179/89 to left ankle. Pt moving all four extremities. Pt does not follow commands. Pt does attempt eye opening when asked, and did respond back "hey" to MD at bedside. Pt planned to have dialysis today.

## 2019-04-11 NOTE — Progress Notes (Signed)
Hospitalist daily note  Zaragoza Fouse [id number] DOB: 1971/03/23 DOA: 04/05/2019  PCP: Patient, No Pcp Per   Narrative:  49 year old remote history of recurrent partial seizures 2007, cannabis use Patient admitted for metabolic encephalopathy in the setting of acute renal failure with elevated LFTs as well as rhabdomyolysis-EEG ordered nephrology consulted as per neurologist On 04/10/2019 had 2 separate seizures early in the morning and then subsequently and neurology loaded him with high-dose Vimpat and start steroids  Data Reviewed:  BUN/creatinine from admission 115/10.3-->138/11.17-->137/11.03-->119/8.82 Bicarb 21, anion gap 18 CT head 12/27-unremarkable CT Angiotensin converting enzyme 16, TSH 2.065, B12 359, CSF cell count and differential showed 3 WBC yellow, Gram stain showed no organisms no WBC Ultrasound abdomen 12/27 pelvis showed fatty liver no gallstones MRI brain 12/29 hyperintense signal abnormality within the medial temporal lobe as well as posterior medial left thalamus-?  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 Patient started on steroids 1/1 Solu-Medrol 1 g every 24  Recurrent seizures Vimpat loaded higher dose 75 every 12 given seizures occurring on 12/31 EEG has been discontinued and was grossly normal 12/30--Previously  normal Long-term EEG started again on 1/1 Ativan 2 mg as needed for seizures Rest as per neurology  Acute kidney injury 2/2 rhabdo +3.3 L so far Scheduled for dialysis apparently today CK seems to be trending downwards from a peak 14,000 continuing LR  100 cc/h  Hypertensive urgency Continue hydralazine but increase dose to 10 every 4 as needed  Transaminitis Ultrasound liver fatty liver hepatitis panel normal other work-up normal-monitor trends  Hyperphosphatemia defer binders to renal  Marijuana use No other drugs positive on drug screen  HTN uncontrolled Blood pressures are brought up 170 start metoprolol low-dose 12.5 twice daily and can use labetalol 180  Spoke with Sister 04/11/2019 and updated, Full code status, Heparin  Subjective: Listless but responds "yes" when I speak to him not really following commands and overall confused  Consultants:   Nephrology  Neurology Procedures:   As above Antimicrobials:   None   Objective: Vitals:   04/11/19 0255 04/11/19 0500 04/11/19 0512 04/11/19 0534  BP: (!) 205/128  (!) 208/78 (!) 215/73  Pulse:   96   Resp:   15   Temp:   98.4 F (36.9 C)   TempSrc:   Oral   SpO2:   96%   Weight:  72.2 kg    Height:        Intake/Output Summary (Last 24 hours) at 04/11/2019 0725 Last data filed at 04/11/2019 0300 Gross per 24 hour  Intake 1685.67 ml  Output 2500 ml  Net -814.33 ml   Filed Weights   04/06/19 2142 04/08/19 2051 04/11/19 0500  Weight: 67 kg 66.8 kg 72.2 kg    Examination: More awake but not really coherent writhing in the bed Pupils are equally reactive to light moving upper and lower extremities in a meaningless way Chest clinically clear no added sound no rales no rhonchi Abdomen soft Reflexes are brisk however it is a difficult exam He is not able to follow commands to test coordination   Scheduled Meds: . Chlorhexidine Gluconate Cloth  6 each Topical Q0600  . cloNIDine  0.2 mg Oral Once  .  labetalol  5 mg Intravenous Once  . lidocaine (PF)  5 mL Other Once  . metoprolol tartrate  12.5 mg Oral BID   Continuous Infusions: . sodium chloride    . sodium chloride    . lacosamide (VIMPAT) IV 100 mg (04/10/19 2158)  . lactated ringers 100 mL/hr at  04/10/19 0505  . methylPREDNISolone (SOLU-MEDROL) injection 1,000 mg (04/10/19 1122)     LOS: 6 days   Time spent: [place], MD [facility]

## 2019-04-12 ENCOUNTER — Inpatient Hospital Stay (HOSPITAL_COMMUNITY): Payer: Medicaid Other

## 2019-04-12 LAB — CBC WITH DIFFERENTIAL/PLATELET
Abs Immature Granulocytes: 0.22 10*3/uL — ABNORMAL HIGH (ref 0.00–0.07)
Basophils Absolute: 0 10*3/uL (ref 0.0–0.1)
Basophils Relative: 0 %
Eosinophils Absolute: 0 10*3/uL (ref 0.0–0.5)
Eosinophils Relative: 0 %
HCT: 27.6 % — ABNORMAL LOW (ref 39.0–52.0)
Hemoglobin: 9.3 g/dL — ABNORMAL LOW (ref 13.0–17.0)
Immature Granulocytes: 1 %
Lymphocytes Relative: 8 %
Lymphs Abs: 1.3 10*3/uL (ref 0.7–4.0)
MCH: 26.5 pg (ref 26.0–34.0)
MCHC: 33.7 g/dL (ref 30.0–36.0)
MCV: 78.6 fL — ABNORMAL LOW (ref 80.0–100.0)
Monocytes Absolute: 1 10*3/uL (ref 0.1–1.0)
Monocytes Relative: 6 %
Neutro Abs: 14.4 10*3/uL — ABNORMAL HIGH (ref 1.7–7.7)
Neutrophils Relative %: 85 %
Platelets: 278 10*3/uL (ref 150–400)
RBC: 3.51 MIL/uL — ABNORMAL LOW (ref 4.22–5.81)
RDW: 13.1 % (ref 11.5–15.5)
WBC: 17 10*3/uL — ABNORMAL HIGH (ref 4.0–10.5)
nRBC: 0 % (ref 0.0–0.2)

## 2019-04-12 LAB — RENAL FUNCTION PANEL
Albumin: 2.9 g/dL — ABNORMAL LOW (ref 3.5–5.0)
Anion gap: 16 — ABNORMAL HIGH (ref 5–15)
BUN: 75 mg/dL — ABNORMAL HIGH (ref 6–20)
CO2: 21 mmol/L — ABNORMAL LOW (ref 22–32)
Calcium: 8.7 mg/dL — ABNORMAL LOW (ref 8.9–10.3)
Chloride: 106 mmol/L (ref 98–111)
Creatinine, Ser: 5.72 mg/dL — ABNORMAL HIGH (ref 0.61–1.24)
GFR calc Af Amer: 12 mL/min — ABNORMAL LOW (ref >60)
GFR calc non Af Amer: 11 mL/min — ABNORMAL LOW (ref >60)
Glucose, Bld: 151 mg/dL — ABNORMAL HIGH (ref 70–99)
Phosphorus: 5.4 mg/dL — ABNORMAL HIGH (ref 2.5–4.6)
Potassium: 4.1 mmol/L (ref 3.5–5.1)
Sodium: 143 mmol/L (ref 135–145)

## 2019-04-12 LAB — CK: Total CK: 997 U/L — ABNORMAL HIGH (ref 49–397)

## 2019-04-12 MED ORDER — HEPARIN SODIUM (PORCINE) 5000 UNIT/ML IJ SOLN
5000.0000 [IU] | Freq: Three times a day (TID) | INTRAMUSCULAR | Status: DC
  Administered 2019-04-12 – 2019-04-15 (×7): 5000 [IU] via SUBCUTANEOUS
  Filled 2019-04-12 (×7): qty 1

## 2019-04-12 MED ORDER — CLONIDINE HCL 0.3 MG/24HR TD PTWK
0.3000 mg | MEDICATED_PATCH | TRANSDERMAL | Status: DC
  Administered 2019-04-12: 0.3 mg via TRANSDERMAL
  Filled 2019-04-12: qty 1

## 2019-04-12 MED ORDER — LABETALOL HCL 5 MG/ML IV SOLN
20.0000 mg | INTRAVENOUS | Status: DC | PRN
  Administered 2019-04-12 – 2019-04-13 (×6): 20 mg via INTRAVENOUS
  Filled 2019-04-12 (×6): qty 4

## 2019-04-12 MED ORDER — PIPERACILLIN-TAZOBACTAM IN DEX 2-0.25 GM/50ML IV SOLN
2.2500 g | Freq: Three times a day (TID) | INTRAVENOUS | Status: DC
  Administered 2019-04-12 – 2019-04-14 (×7): 2.25 g via INTRAVENOUS
  Filled 2019-04-12 (×7): qty 50

## 2019-04-12 MED ORDER — AMLODIPINE BESYLATE 10 MG PO TABS
10.0000 mg | ORAL_TABLET | Freq: Every evening | ORAL | Status: DC
  Filled 2019-04-12: qty 1

## 2019-04-12 MED ORDER — LIP MEDEX EX OINT
TOPICAL_OINTMENT | CUTANEOUS | Status: DC | PRN
  Filled 2019-04-12: qty 7

## 2019-04-12 MED ORDER — IPRATROPIUM-ALBUTEROL 0.5-2.5 (3) MG/3ML IN SOLN: 3.0000 mL | Freq: Four times a day (QID) | RESPIRATORY_TRACT | Status: DC | PRN

## 2019-04-12 NOTE — Procedures (Addendum)
Patient Name:Monnie Fugua [id number] Epilepsy Attending:Talin Feister Barbra Sarks Referring Physician/Provider:Dr.Quynn Vilchis Hortense Ramal Duration:04/11/2019 [id number]  to 04/12/2019 1151  Patient history:49 year old male with prior history of seizure disorder who presented with altered mental status and was noted to have left sided focal motor seizures. EEG to evaluate for seizures.  Level of alertness:awake,asleep  AEDs during EEG study:Vimpat  Technical aspects: This EEG study was done with scalp electrodes positioned according to the 10-20 International system of electrode placement. Electrical activity was acquired at a sampling rate of 500Hz  and reviewed with a high frequency filter of 70Hz  and a low frequency filter of 1Hz . EEG data were recorded continuously and digitally stored.  Description: During awake state, no clear posterior dominant rhythm was seen.Sleep was characterized by vertex waves, sleep spindles (12 to 14 Hz), maximal frontocentral. EEG showed continuous generalized 2-5hz  theta-delta slowing. Triphasic waves at 1.5-2hz  were noted, at times rhythmic without clear evolution. Sharp transients were seen in right parieto-occipital region.  EEG artifact was noted after around 2030 on 04/11/2019 due to electrode artifact.   Abnormality - Continuous slow, generalized  - Triphasic waves, generalized  IMPRESSION: This study is suggestive of severe diffuse encephalopathy, non specific to etiology but could be secondary to toxic-metabolic causes. At times, rhythmic discharges were noted without clear evolution and are less likely ictal. No definite seizures were seen.  [place]

## 2019-04-12 NOTE — Progress Notes (Addendum)
Neurology Progress Note   S:// Seen and examined. Overnight was tachypneic and wheezing.  Remains hypertensive.   O:// Current vital signs: BP (!) 179/87   Pulse 89   Temp 98.8 F (37.1 C) (Axillary)   Resp 14   Ht 5\' 8"  (1.727 m)   Wt 69.1 kg   SpO2 92%   BMI 23.16 kg/m  Vital signs in last 24 hours: Temp:  [98 F (36.7 C)-98.8 F (37.1 C)] 98.8 F (37.1 C) (01/03 0357) Pulse Rate:  [72-131] 89 (01/03 0800) Resp:  [14-28] 14 (01/03 0800) BP: (150-232)/(67-202) 179/87 (01/03 0800) SpO2:  [92 %-100 %] 92 % (01/03 0800) Weight:  [69.1 kg] 69.1 kg (01/03 0349) General: Sleeping, in no apparent distress HEENT: Dry mucous membranes, extremely poor dentition Respiratory: Breathing well and saturating normally on room air.  Scattered wheezing Cardiovascular regular rate rhythm Extremities warm well perfused no edema Neurological  Mental status: Sleepy, does not wake to voice, opens eyes to noxious immolation and does not follow commands.  Is nonverbal.  Moans only. Motor exam: Pupils equal round react to light no gaze deviation or preference, blinks to threat from both sides, no obvious facial asymmetry. Motor exam: Moving all 4 spontaneously and withdrawing to noxious stimulation Sensory exam as above.  Medications  Current Facility-Administered Medications:  .  0.9 %  sodium chloride infusion, 100 mL, Intravenous, PRN, Samtani, Jai-Gurmukh, MD .  0.9 %  sodium chloride infusion, 100 mL, Intravenous, PRN, Nita Sells, MD .  acetaminophen (TYLENOL) tablet 650 mg, 650 mg, Oral, Q6H PRN, 650 mg at 04/09/19 0733 **OR** acetaminophen (TYLENOL) suppository 650 mg, 650 mg, Rectal, Q6H PRN, Samtani, Jai-Gurmukh, MD .  alteplase (CATHFLO ACTIVASE) injection 2 mg, 2 mg, Intracatheter, Once PRN, Nita Sells, MD .  Chlorhexidine Gluconate Cloth 2 % PADS 6 each, 6 each, Topical, Q0600, Nita Sells, MD, 6 each at 04/12/19 0546 .  cloNIDine (CATAPRES - Dosed in  mg/24 hr) patch 0.2 mg, 0.2 mg, Transdermal, Weekly, Blount, Xenia T, NP, 0.2 mg at 04/11/19 2248 .  heparin injection 1,000 Units, 1,000 Units, Dialysis, PRN, Nita Sells, MD .  hydrALAZINE (APRESOLINE) injection 10 mg, 10 mg, Intravenous, Q4H PRN, Verlon Au, Jai-Gurmukh, MD, 10 mg at 04/12/19 0800 .  ipratropium-albuterol (DUONEB) 0.5-2.5 (3) MG/3ML nebulizer solution 3 mL, 3 mL, Nebulization, Q6H PRN, Blount, Xenia T, NP .  labetalol (NORMODYNE) injection 20 mg, 20 mg, Intravenous, Q2H PRN, Nita Sells, MD, 20 mg at 04/12/19 0745 .  labetalol (NORMODYNE) injection 5 mg, 5 mg, Intravenous, Once, Samtani, Jai-Gurmukh, MD .  lacosamide (VIMPAT) 100 mg in sodium chloride 0.9 % 25 mL IVPB, 100 mg, Intravenous, Q12H, Lora Havens, MD, Last Rate: 70 mL/hr at 04/11/19 2257, 100 mg at 04/11/19 2257 .  lactated ringers infusion, , Intravenous, Continuous, Samtani, Jai-Gurmukh, MD, Last Rate: 100 mL/hr at 04/10/19 0505, New Bag at 04/10/19 0505 .  lidocaine (PF) (XYLOCAINE) 1 % injection 5 mL, 5 mL, Other, Once, Samtani, Jai-Gurmukh, MD .  lidocaine (PF) (XYLOCAINE) 1 % injection 5 mL, 5 mL, Intradermal, PRN, Samtani, Jai-Gurmukh, MD .  lidocaine (PF) (XYLOCAINE) 1 % injection, , , PRN, Greggory Keen, MD, 10 mL at 04/10/19 1454 .  lidocaine-prilocaine (EMLA) cream 1 application, 1 application, Topical, PRN, Samtani, Jai-Gurmukh, MD .  LORazepam (ATIVAN) injection 2 mg, 2 mg, Intravenous, Q1H PRN, Blount, Xenia T, NP, 2 mg at 04/12/19 0410 .  methylPREDNISolone sodium succinate (SOLU-MEDROL) 1,000 mg in sodium chloride 0.9 % 50 mL IVPB, 1,000  mg, Intravenous, Q24H, Samtani, Jai-Gurmukh, MD, Last Rate: 58 mL/hr at 04/11/19 1038, 1,000 mg at 04/11/19 1038 .  ondansetron (ZOFRAN) tablet 4 mg, 4 mg, Oral, Q6H PRN **OR** ondansetron (ZOFRAN) injection 4 mg, 4 mg, Intravenous, Q6H PRN, Verlon Au, Jai-Gurmukh, MD .  pentafluoroprop-tetrafluoroeth (GEBAUERS) aerosol 1 application, 1 application,  Topical, PRN, Nita Sells, MD .  traMADol (ULTRAM) tablet 50 mg, 50 mg, Oral, Q6H PRN, Nita Sells, MD, 50 mg at 04/09/19 1924 Labs CBC    Component Value Date/Time   WBC 17.0 (H) 04/12/2019 0703   RBC 3.51 (L) 04/12/2019 0703   HGB 9.3 (L) 04/12/2019 0703   HCT 27.6 (L) 04/12/2019 0703   PLT 278 04/12/2019 0703   MCV 78.6 (L) 04/12/2019 0703   MCH 26.5 04/12/2019 0703   MCHC 33.7 04/12/2019 0703   RDW 13.1 04/12/2019 0703   LYMPHSABS 1.3 04/12/2019 0703   MONOABS 1.0 04/12/2019 0703   EOSABS 0.0 04/12/2019 0703   BASOSABS 0.0 04/12/2019 0703    CMP     Component Value Date/Time   NA 140 04/11/2019 0703   K 4.3 04/11/2019 0703   CL 101 04/11/2019 0703   CO2 21 (L) 04/11/2019 0703   GLUCOSE 212 (H) 04/11/2019 0703   BUN 119 (H) 04/11/2019 0703   CREATININE 8.82 (H) 04/11/2019 0703   CALCIUM 9.0 04/11/2019 0703   PROT 4.6 (L) 04/07/2019 0538   ALBUMIN 2.8 (L) 04/11/2019 0703   AST 89 (H) 04/07/2019 0538   ALT 75 (H) 04/07/2019 0538   ALKPHOS 41 04/07/2019 0538   BILITOT 0.7 04/07/2019 0538   GFRNONAA 6 (L) 04/11/2019 0703   GFRAA 7 (L) 04/11/2019 0703   SSA SSB ANA unremarkable ACE unremarkable  Imaging I have reviewed images in epic and the results pertinent to this consultation are: No new imaging No new CSF studies  Assessment: 49 year old with history of seizure disorder and marijuana use presented with altered mental status 1 week prior to presentation and had sudden onset of acute renal failure with elevated CK.  Received hemodialysis yesterday. Likely toxic metabolic encephalopathy from prolonged uremia. Also nonfocal motor convulsive status with objects that is resolved. He is on LTM-results pending overnight.   Recommendations: Appreciate nephrology assistance with HD. I will review the overnight LTM EEG report with the reader and update on the plan for continuing LTM. On Solu-Medrol 1 g daily for 5 days-continue the full 5-day  course. Autoimmune work-up unremarkable thus far Autoimmune encephalitis and paraneoplastic encephalitis panel pending. Consider short-term repeat MRI brain after the steroids are ordered after a few rounds of hemodialysis  -- Amie Portland, MD [facility] Pager: [phone] If 7pm to 7am, please call on call as listed on AMION.  Addendum Discussed with Dr. Hortense Ramal the LTM.  No overnight seizures.  Artifact due to misplaced leads. Due to not having captured any seizures over the last 48 hours, I will discontinue the LTM EEG. We will continue to follow the patient and based on the exam see if we need to repeat the EEG. Plan was discussed with Dr. Hortense Ramal We will continue to follow -- Amie Portland, MD [facility] Pager: [phone] If 7pm to 7am, please call on call as listed on AMION.

## 2019-04-12 NOTE — Progress Notes (Signed)
Pharmacy Antibiotic Note  [name] is a 49 y.o. male admitted on 04/05/2019 with toxic metabolic encephalopathy. Pharmacy has been consulted to dose Zosyn for aspiration pneumonia.  Pt is afebrile, WBC elevated at 17. Was started on acyclovir for possible viral encephalitis on 12/29 but stopped on 12/31 after LP came back negative. Pt has ARF 2/2 rhabdomyolysis, TDC was placed on 1/1 and tolerated half session dialysis on 1/2. SCr trending down to 5.72, CrCl <20 ml/min.  Plan: Zosyn 2.25 gm IV q8h  Monitor clinical improvement, renal function, and LOT  Height: 5\' 8"  (172.7 cm) Weight: 152 lb 5.4 oz (69.1 kg) IBW/kg (Calculated) : 68.4  Temp (24hrs), Avg:98.6 F (37 C), Min:98 F (36.7 C), Max:98.9 F (37.2 C)  Recent Labs  Lab 04/05/19 1752 04/05/19 2139 04/06/19 0426 04/08/19 0530 04/09/19 0553 04/10/19 0526 04/10/19 1038 04/11/19 0703 04/12/19 0703  WBC  --   --  10.4 9.4  --   --  9.2 15.7* 17.0*  CREATININE  --   --  10.42* 11.17* 11.03* 10.16*  --  8.82* 5.72*  LATICACIDVEN 2.7* 2.0*  --  1.2  --   --   --   --   --     Estimated Creatinine Clearance: 15.1 mL/min (A) (by C-G formula based on SCr of 5.72 mg/dL (H)).    Allergies  Allergen Reactions  . Penicillins Other (See Comments)    Childhood allergy   . Sulfur Hives and Rash    Antimicrobials this admission: Acyclovir 12/29 >> 12/31 Zosyn 1/3 >>  Microbiology results: 12/27 BCx: ngtd 12/27 covid neg  12/30 LP neg  Thank you for allowing pharmacy to be a part of this patient's care.  Berenice Bouton, PharmD PGY1 Pharmacy Resident  Please check AMION for all [place] phone numbers After 10:00 PM, call [place] [phone] 04/12/2019  9:52 AM

## 2019-04-12 NOTE — Progress Notes (Signed)
LTM maint complete - no skin breakdown under:  FP2, Fp1, T6, O1

## 2019-04-12 NOTE — Progress Notes (Signed)
Paged on-call provider via amion to notify of tachypnea, audible wheezing, and persistent HTN- despite use of IV PRN antihypertensives and clonidine patch.

## 2019-04-12 NOTE — Progress Notes (Signed)
LTM EEG discontinued - no skin breakdown at unhook.   

## 2019-04-12 NOTE — Progress Notes (Signed)
MEWS score red due to sedation. Notified provider of elevated BP and asked for IV Metoprolol due to inability to take POs presently. He presented to bedside to assess patient. See new orders.   04/12/19 0745  MEWS Score  Resp 17  ECG Heart Rate 92  Pulse Rate 91  BP (!) 228/75  Level of Consciousness Responds to Pain (Sedated from Ativan)  SpO2 94 %  O2 Device Room Air  MEWS Score  MEWS RR 0  MEWS Pulse 0  MEWS Systolic 2  MEWS LOC 2  MEWS Temp 0  MEWS Score 4  MEWS Score Color Red  MEWS Assessment  Is this an acute change? Yes  MEWS guidelines implemented *See Row Information* Red

## 2019-04-12 NOTE — Progress Notes (Signed)
Pt has a childhood allergy to penicillin, and he has piperacillin (Zosyn) ordered for medication. When I called pharmacy, they stated that I should still give medication because no apparent allergy occurred on previous dose today. They also stated that I should monitor patient for allergic reaction on administration. Pharmacy also stated to give the Zosyn with normal saline and not lactated ringers.

## 2019-04-12 NOTE — Progress Notes (Addendum)
[facility] Progress Note    Assessment/ Plan:   1. Severe AKI secondary to rhabdomyolysis--> with evidence of uremia.  Need to start dialysis.  S/p [facility] 04/10/2019.  HD 04/11/2019 --> will make day by day dialysis decision, orders have been placed for 04/13/2019.  UA with no cells on admission- would doubt GN in this situation. 2. Mild hyperkalemia, resolved 3. Metabolic acidosis improved 4. Rhabdomyolysis-- etiology unclear-- atypical story for status epilepticus, ? If myositis involved-- will investigate 5. Seizure disorder-- increased seizures overnight, AEDs being adjusted by neuro.  Getting solumedrol as well for possible autoimmune etiology of encephalitis/ seizures and autoimmune workup pending 6. Confusional state, amnesia,  LP 04/08/19 which is largely negative.   7. Hyperuricemia, ? Related to rhabdo 8. Hypertension: will add amlodipine QHS and follow  Subjective:    HD yesterday but was very restless and required restraints and ativan.  BUN much improved but Pt still not responding to commands.  Looks like concern for aspiration and Zosyn started   Objective:   BP (!) 171/61   Pulse 97   Temp 98.9 F (37.2 C) (Oral)   Resp 16   Ht 5\' 8"  (1.727 m)   Wt 69.1 kg   SpO2 97%   BMI 23.16 kg/m   Intake/Output Summary (Last 24 hours) at 04/12/2019 1000 Last data filed at 04/12/2019 0745 Gross per 24 hour  Intake 0 ml  Output 852 ml  Net -852 ml   Weight change: -3.1 kg  Physical Exam:  Gen: appears ill, on L side, not responsive to commands CVS: RRR Resp: clear bilaterally no c/w/r Abd: soft  Ext: no LE edema Neuro: not responsive  Imaging: IR Fluoro Guide CV Line Right  Result Date: 04/10/2019 INDICATION: End-stage renal disease, no current access EXAM: ULTRASOUND GUIDANCE FOR VASCULAR ACCESS RIGHT INTERNAL JUGULAR PERMANENT HEMODIALYSIS CATHETER Date:  04/10/2019 04/10/2019 3:00 pm Radiologist:  Jerilynn Mages. Daryll Brod, MD Guidance:  Ultrasound and fluoroscopic  FLUOROSCOPY TIME:  Fluoroscopy Time: 0 minutes 36 seconds (1.0 mGy). MEDICATIONS: Vancomycin 1 g within 1 hour of the procedure ANESTHESIA/SEDATION: Versed 1.0 mg IV; Fentanyl 50 mcg IV; Moderate Sedation Time:  16 minutes The patient was continuously monitored during the procedure by the interventional radiology nurse under my direct supervision. CONTRAST:  None. COMPLICATIONS: None immediate. PROCEDURE: Informed consent was obtained from the patient following explanation of the procedure, risks, benefits and alternatives. The patient understands, agrees and consents for the procedure. All questions were addressed. A time out was performed. Maximal barrier sterile technique utilized including caps, mask, sterile gowns, sterile gloves, large sterile drape, hand hygiene, and 2% chlorhexidine scrub. Under sterile conditions and local anesthesia, right internal jugular micropuncture venous access was performed with ultrasound. Images were obtained for documentation of the patent right internal jugular vein. A guide wire was inserted followed by a transitional dilator. Next, a 0.035 guidewire was advanced into the IVC with a 5-French catheter. Measurements were obtained from the right venotomy site to the proximal right atrium. In the right infraclavicular chest, a subcutaneous tunnel was created under sterile conditions and local anesthesia. 1% lidocaine with epinephrine was utilized for this. The 19 cm tip to cuff dialysis catheter was tunneled subcutaneously to the venotomy site and inserted into the SVC/RA junction through a valved peel-away sheath. Position was confirmed with fluoroscopy. Images were obtained for documentation. Blood was aspirated from the catheter followed by saline and heparin flushes. The appropriate volume and strength of heparin was instilled in each lumen.  Caps were applied. The catheter was secured at the tunnel site with Gelfoam and a pursestring suture. The venotomy site was closed with  subcuticular Vicryl suture. Dermabond was applied to the small right neck incision. A dry sterile dressing was applied. The catheter is ready for use. No immediate complications. IMPRESSION: Ultrasound and fluoroscopically guided right internal jugular tunneled hemodialysis catheter (19 cm tip to cuff dialysis catheter). Electronically Signed   By: Jerilynn Mages.  Shick M.D.   On: 04/10/2019 15:03   IR US Guide Vasc Access Right  Result Date: 04/10/2019 INDICATION: End-stage renal disease, no current access EXAM: ULTRASOUND GUIDANCE FOR VASCULAR ACCESS RIGHT INTERNAL JUGULAR PERMANENT HEMODIALYSIS CATHETER Date:  04/10/2019 04/10/2019 3:00 pm Radiologist:  Jerilynn Mages. Daryll Brod, MD Guidance:  Ultrasound and fluoroscopic FLUOROSCOPY TIME:  Fluoroscopy Time: 0 minutes 36 seconds (1.0 mGy). MEDICATIONS: Vancomycin 1 g within 1 hour of the procedure ANESTHESIA/SEDATION: Versed 1.0 mg IV; Fentanyl 50 mcg IV; Moderate Sedation Time:  16 minutes The patient was continuously monitored during the procedure by the interventional radiology nurse under my direct supervision. CONTRAST:  None. COMPLICATIONS: None immediate. PROCEDURE: Informed consent was obtained from the patient following explanation of the procedure, risks, benefits and alternatives. The patient understands, agrees and consents for the procedure. All questions were addressed. A time out was performed. Maximal barrier sterile technique utilized including caps, mask, sterile gowns, sterile gloves, large sterile drape, hand hygiene, and 2% chlorhexidine scrub. Under sterile conditions and local anesthesia, right internal jugular micropuncture venous access was performed with ultrasound. Images were obtained for documentation of the patent right internal jugular vein. A guide wire was inserted followed by a transitional dilator. Next, a 0.035 guidewire was advanced into the IVC with a 5-French catheter. Measurements were obtained from the right venotomy site to the proximal right  atrium. In the right infraclavicular chest, a subcutaneous tunnel was created under sterile conditions and local anesthesia. 1% lidocaine with epinephrine was utilized for this. The 19 cm tip to cuff dialysis catheter was tunneled subcutaneously to the venotomy site and inserted into the SVC/RA junction through a valved peel-away sheath. Position was confirmed with fluoroscopy. Images were obtained for documentation. Blood was aspirated from the catheter followed by saline and heparin flushes. The appropriate volume and strength of heparin was instilled in each lumen. Caps were applied. The catheter was secured at the tunnel site with Gelfoam and a pursestring suture. The venotomy site was closed with subcuticular Vicryl suture. Dermabond was applied to the small right neck incision. A dry sterile dressing was applied. The catheter is ready for use. No immediate complications. IMPRESSION: Ultrasound and fluoroscopically guided right internal jugular tunneled hemodialysis catheter (19 cm tip to cuff dialysis catheter). Electronically Signed   By: Jerilynn Mages.  Shick M.D.   On: 04/10/2019 15:03   DG CHEST PORT 1 VIEW  Result Date: 04/12/2019 CLINICAL DATA:  Initial evaluation for acute shortness of breath. Wheezing. EXAM: PORTABLE CHEST 1 VIEW COMPARISON:  Prior radiograph from 04/06/2007. FINDINGS: Dual lumen right IJ approach central line in place with tip overlying the distal SVC. Moderate cardiomegaly. Mediastinal silhouette within normal limits. Lungs normally inflated. Mild diffuse pulmonary interstitial congestion without frank pulmonary edema. No visible pleural effusion. No focal infiltrates. No pneumothorax. No acute osseous finding. IMPRESSION: Moderate cardiomegaly with mild diffuse pulmonary interstitial congestion without frank pulmonary edema. Electronically Signed   By: Jeannine Boga M.D.   On: 04/12/2019 04:22   Overnight EEG with video  Result Date: 04/11/2019 Lora Havens, MD  04/12/2019   8:46 AM Patient Name:[name] [id number] Epilepsy Attending:Priyanka Barbra Sarks Referring Physician/Provider:Dr. Zeb Comfort Duration: 04/10/2019 1027  to 04/11/2019 1027  Patient history:49 year old male with prior history of seizure disorder who presented with altered mental status and was noted to have left sided focal motor seizures. EEG to evaluate for seizures.  Level of alertness:awake, asleep  AEDs during EEG study: Vimpat  Technical aspects: This EEG study was done with scalp electrodes positioned according to the 10-20 International system of electrode placement. Electrical activity was acquired at a sampling rate of 500Hz  and reviewed with a high frequency filter of 70Hz  and a low frequency filter of 1Hz . EEG data were recorded continuously and digitally stored.  Description: During awake state, no clear posterior dominant rhythm was seen.  Sleep was characterized by vertex waves, sleep spindles (12 to 14 Hz), maximal frontocentral.  EEG showed continuous generalized 3-6hz  theta-delta slowing. Triphasic waves at 1-1.5hz , generalized were noted at times, when patient was stimulated. Sharp transients were seen in right parieto-occipital region. Abnormality -Continuous slow, generalized - Triphasic waves, generalized  IMPRESSION: This study issuggestive of moderate to severe diffuse encephalopathy non specific to etiology but could be secondary to toxic-metabolic causes.  No seizures ordefiniteepileptiform discharges were seen throughout the recording.  Lora Havens    Labs: BMET Recent Labs  Lab 04/06/19 0426 04/07/19 0538 04/07/19 1453 04/08/19 0530 04/09/19 0553 04/10/19 0526 04/11/19 0703 04/12/19 0703  NA 136 139 139 140 142 138 140 143  K 5.4* 3.9 3.6 4.3 4.5 4.6 4.3 4.1  CL 105 100 96* 98 99 99 101 106  CO2 15* 24 30 26 22 22  21* 21*  GLUCOSE 147* 183* 122* 97 112* 147* 212* 151*  BUN 148* 146* 145* 138* 137* 127* 119* 75*  CREATININE 10.42* 10.50*  10.87* 11.17* 11.03* 10.16* 8.82* 5.72*  CALCIUM 8.5* 7.9* 8.0* 8.3* 8.7* 8.6* 9.0 8.7*  PHOS 6.2* 5.4*  --  6.8* 6.9* 6.8* 5.5* 5.4*   CBC Recent Labs  Lab 04/08/19 0530 04/10/19 1038 04/11/19 0703 04/12/19 0703  WBC 9.4 9.2 15.7* 17.0*  NEUTROABS  --   --  13.7* 14.4*  HGB 12.4* 11.8* 11.5* 9.3*  HCT 36.6* 34.6* 33.8* 27.6*  MCV 78.2* 77.2* 78.4* 78.6*  PLT 229 277 335 278    Medications:    . Chlorhexidine Gluconate Cloth  6 each Topical Q0600  . cloNIDine  0.2 mg Transdermal Weekly  . labetalol  5 mg Intravenous Once  . lidocaine (PF)  5 mL Other Once      Madelon Lips, MD 04/12/2019, 10:00 AM

## 2019-04-12 NOTE — Progress Notes (Signed)
Hospitalist daily note  [name] EF:[id number] DOB: Oct 29, 1970 DOA: 04/05/2019  PCP: Patient, No Pcp Per  Narrative:  49 year old remote history of recurrent partial seizures 2007, cannabis use Patient admitted for metabolic encephalopathy in the setting of acute renal failure with elevated LFTs as well as rhabdomyolysis-EEG ordered nephrology consulted as per neurologist On 04/10/2019 had 2 separate seizures early in the morning and then subsequently and neurology loaded him with high-dose Vimpat and start steroids He was sent to stepdown unit and received first dialysis on 04/11/2019  Data Reviewed:  BUN/creatinine from admission 115/10.3-->138/11.17-->137/11.03-->119/8.82--75/5.72 Bicarb 21, anion gap 18 Hemoglobin 11-->9.3 WBC up to 17 CT head 12/27-unremarkable CT Angiotensin converting enzyme 16, TSH 2.065, B12 359, CSF cell count and differential showed 3 WBC yellow, Gram stain showed no organisms no WBC Ultrasound abdomen 12/27 pelvis showed fatty liver no gallstones MRI brain 12/29 hyperintense signal abnormality within the medial temporal lobe as well as posterior medial left thalamus-?  HSV encephalitis or autoimmune encephalitis possibly superimposed seizure related edema chronic blood products along the right parietal lobe possibly posttraumatic  Assessment & Plan: Toxic metabolic encephalopathy -etiology unclear-work-up being performed by neurology-MRI performed questioning encephalitis may be autoimmune-so far anti-Jo, angiotensin-converting enzyme, HIV, HSV, VDRL all negative LP performed 12/30 and labs not suggestive of bacterial possibly discontinue acyclovir today Patient started on steroids 1/1 Solu-Medrol 1 g every 24  Possible aspiration? Patient unable to really interact and you white count of 15 although no fever suggestive of potential aspiration pneumonitis-chest x-ray 1 view does not confirm start zosyn 1/3 and monitor trends Would get SLP to see  non-emergently--otherwsie would keep NPO except for meds  Recurrent seizures Vimpat loaded higher dose 75 every 12 given seizures occurring on 12/31 EEG has been discontinued and was grossly normal 12/30--Previously  normal Long-term EEG started again on 1/1 but hasn't shown anything and LTM EEG to be d/c per neurologist later today Ativan 2 mg as needed for seizures Rest as per neurology  Acute kidney injury 2/2 rhabdo +2.4 L so far CK seems to be trending downwards from a peak 14,000 continuing LR 100 cc/h-->cut back rate to 50 cc/h  Hypertensive urgency Continue hydralazine but increase dose to 10 every 4 as needed  Transaminitis Ultrasound liver fatty liver hepatitis panel normal other work-up normal-monitor trends  Hyperphosphatemia defer binders to renal  Marijuana use No other drugs positive on drug screen  HTN uncontrolled  Blood pressures elevated  higher dosing PRN labetalol and Use IV hydralazine as cannot give PO  Spoke with Sister 04/12/2019 and updated, Full code status, Heparin  Subjective:  Cannot get ROS Patient in restraints--patient made some comments earlier to nursing but is incohernet a this time  Consultants:   Nephrology  Neurology Procedures:   As above Antimicrobials:   None   Objective: Vitals:   04/11/19 2325 04/12/19 0347 04/12/19 0349 04/12/19 0357  BP: (!) 183/127 (!) 190/138  (!) 180/117  Pulse: (!) 118   (!) 102  Resp: 20   (!) 26  Temp: 98 F (36.7 C)   98.8 F (37.1 C)  TempSrc: Oral   Axillary  SpO2: 97%   96%  Weight:   69.1 kg   Height:        Intake/Output Summary (Last 24 hours) at 04/12/2019 0732 Last data filed at 04/12/2019 0400 Gross per 24 hour  Intake -  Output 852 ml  Net -852 ml   Filed Weights   04/08/19 2051 04/11/19 0500 04/12/19 0349  Weight: 66.8 kg 72.2 kg 69.1 kg    Examination: not really coherent writhing in the bed Pupils are equally reactive to light moving upper and lower extremities  in a meaningless way Chest clinically clear no added sound no rales no rhonchi--poor exam as patient cannot turn in bed Abdomen soft Reflexes are brisk however it is a difficult exam   Scheduled Meds: . Chlorhexidine Gluconate Cloth  6 each Topical Q0600  . cloNIDine  0.2 mg Transdermal Weekly  . labetalol  5 mg Intravenous Once  . lidocaine (PF)  5 mL Other Once  . metoprolol tartrate  12.5 mg Oral BID   Continuous Infusions: . sodium chloride    . sodium chloride    . lacosamide (VIMPAT) IV 100 mg (04/11/19 2257)  . lactated ringers 100 mL/hr at 04/10/19 0505  . methylPREDNISolone (SOLU-MEDROL) injection 1,000 mg (04/11/19 1038)     LOS: 7 days   Time spent: [place], MD [facility]

## 2019-04-13 DIAGNOSIS — R569 Unspecified convulsions: Secondary | ICD-10-CM

## 2019-04-13 LAB — CBC WITH DIFFERENTIAL/PLATELET
Abs Immature Granulocytes: 0.17 10*3/uL — ABNORMAL HIGH (ref 0.00–0.07)
Basophils Absolute: 0 10*3/uL (ref 0.0–0.1)
Basophils Relative: 0 %
Eosinophils Absolute: 0 10*3/uL (ref 0.0–0.5)
Eosinophils Relative: 0 %
HCT: 29.1 % — ABNORMAL LOW (ref 39.0–52.0)
Hemoglobin: 9.5 g/dL — ABNORMAL LOW (ref 13.0–17.0)
Immature Granulocytes: 1 %
Lymphocytes Relative: 10 %
Lymphs Abs: 1.3 10*3/uL (ref 0.7–4.0)
MCH: 26.4 pg (ref 26.0–34.0)
MCHC: 32.6 g/dL (ref 30.0–36.0)
MCV: 80.8 fL (ref 80.0–100.0)
Monocytes Absolute: 0.7 10*3/uL (ref 0.1–1.0)
Monocytes Relative: 5 %
Neutro Abs: 11.2 10*3/uL — ABNORMAL HIGH (ref 1.7–7.7)
Neutrophils Relative %: 84 %
Platelets: 300 10*3/uL (ref 150–400)
RBC: 3.6 MIL/uL — ABNORMAL LOW (ref 4.22–5.81)
RDW: 13.2 % (ref 11.5–15.5)
WBC: 13.4 10*3/uL — ABNORMAL HIGH (ref 4.0–10.5)
nRBC: 0 % (ref 0.0–0.2)

## 2019-04-13 LAB — PROCALCITONIN: Procalcitonin: 0.22 ng/mL

## 2019-04-13 LAB — RENAL FUNCTION PANEL
Albumin: 2.9 g/dL — ABNORMAL LOW (ref 3.5–5.0)
Anion gap: 16 — ABNORMAL HIGH (ref 5–15)
BUN: 87 mg/dL — ABNORMAL HIGH (ref 6–20)
CO2: 20 mmol/L — ABNORMAL LOW (ref 22–32)
Calcium: 8.9 mg/dL (ref 8.9–10.3)
Chloride: 110 mmol/L (ref 98–111)
Creatinine, Ser: 5.54 mg/dL — ABNORMAL HIGH (ref 0.61–1.24)
GFR calc Af Amer: 13 mL/min — ABNORMAL LOW (ref >60)
GFR calc non Af Amer: 11 mL/min — ABNORMAL LOW (ref >60)
Glucose, Bld: 183 mg/dL — ABNORMAL HIGH (ref 70–99)
Phosphorus: 6.2 mg/dL — ABNORMAL HIGH (ref 2.5–4.6)
Potassium: 4.5 mmol/L (ref 3.5–5.1)
Sodium: 146 mmol/L — ABNORMAL HIGH (ref 135–145)

## 2019-04-13 LAB — CK: Total CK: 687 U/L — ABNORMAL HIGH (ref 49–397)

## 2019-04-13 LAB — GLUCOSE, CAPILLARY
Glucose-Capillary: 151 mg/dL — ABNORMAL HIGH (ref 70–99)
Glucose-Capillary: 168 mg/dL — ABNORMAL HIGH (ref 70–99)

## 2019-04-13 LAB — RHEUMATOID FACTOR: Rheumatoid fact SerPl-aCnc: <10 IU/mL (ref 0.0–13.9)

## 2019-04-13 MED ORDER — LORAZEPAM 2 MG/ML IJ SOLN
2.0000 mg | INTRAMUSCULAR | Status: DC | PRN
  Administered 2019-04-13: 2 mg via INTRAMUSCULAR
  Filled 2019-04-13: qty 1

## 2019-04-13 MED ORDER — INSULIN ASPART 100 UNIT/ML ~~LOC~~ SOLN
0.0000 [IU] | Freq: Three times a day (TID) | SUBCUTANEOUS | Status: DC
  Administered 2019-04-13: 1 [IU] via SUBCUTANEOUS
  Administered 2019-04-14: 5 [IU] via SUBCUTANEOUS
  Administered 2019-04-14: 1 [IU] via SUBCUTANEOUS
  Administered 2019-04-15: 6 [IU] via SUBCUTANEOUS
  Administered 2019-04-15: 3 [IU] via SUBCUTANEOUS
  Administered 2019-04-15 – 2019-04-17 (×4): 1 [IU] via SUBCUTANEOUS
  Administered 2019-04-17: 4 [IU] via SUBCUTANEOUS
  Administered 2019-04-18: 2 [IU] via SUBCUTANEOUS
  Administered 2019-04-18: 1 [IU] via SUBCUTANEOUS

## 2019-04-13 MED ORDER — HYDRALAZINE HCL 20 MG/ML IJ SOLN: 20.0000 mg | INTRAMUSCULAR | Status: DC

## 2019-04-13 MED ORDER — HYDRALAZINE HCL 20 MG/ML IJ SOLN
20.0000 mg | INTRAMUSCULAR | Status: DC | PRN
  Administered 2019-04-13 – 2019-04-14 (×3): 20 mg via INTRAVENOUS
  Filled 2019-04-13 (×4): qty 1

## 2019-04-13 MED ORDER — HEPARIN SODIUM (PORCINE) 1000 UNIT/ML IJ SOLN
INTRAMUSCULAR | Status: AC
  Administered 2019-04-13: 3200 [IU] via INTRAVENOUS_CENTRAL
  Filled 2019-04-13: qty 4

## 2019-04-13 MED ORDER — SODIUM CHLORIDE 0.9 % IV SOLN
75.0000 mg | Freq: Two times a day (BID) | INTRAVENOUS | Status: DC
  Administered 2019-04-13 – 2019-04-15 (×4): 75 mg via INTRAVENOUS
  Filled 2019-04-13 (×5): qty 7.5

## 2019-04-13 MED ORDER — HYDRALAZINE HCL 20 MG/ML IJ SOLN
20.0000 mg | INTRAMUSCULAR | Status: DC | PRN
  Administered 2019-04-13 (×2): 20 mg via INTRAVENOUS
  Filled 2019-04-13 (×4): qty 1

## 2019-04-13 MED ORDER — INSULIN ASPART 100 UNIT/ML ~~LOC~~ SOLN
2.0000 [IU] | Freq: Three times a day (TID) | SUBCUTANEOUS | Status: DC
  Administered 2019-04-13 – 2019-04-15 (×4): 2 [IU] via SUBCUTANEOUS

## 2019-04-13 NOTE — Progress Notes (Signed)
Patient transported to dialysis via bed.

## 2019-04-13 NOTE — Progress Notes (Signed)
[facility] Progress Note    Assessment/ Plan:   1. Severe AKI secondary to rhabdomyolysis--> with evidence of uremia prompting initiation of RRT.  S/p [facility] 04/10/2019.  HD 04/11/2019 --> will make day by day dialysis decision, orders have been placed for today.  UA with no cells on admission- would doubt GN in this situation. 2. Mild hyperkalemia, resolved 3. Metabolic acidosis improved 4. Rhabdomyolysis-- etiology unclear-- atypical story for status epilepticus, ? If myositis involved-- will investigate 5. Seizure disorder-- increased seizures overnight, AEDs being adjusted by neuro.  Getting solumedrol as well for possible autoimmune etiology of encephalitis/ seizures and autoimmune workup pending 6. Confusional state, amnesia,  LP 04/08/19 which is largely negative.   7. Hyperuricemia, ? Related to rhabdo 8. Hypertension:  amlodipine QHS added but not taking po so for now has IV PRNs.   Subjective:    UOP 1.25L yesterday; afebrile.  On zosyn for PNA now.  Signficant HTN noted overnight.  Not interactive this morning with me.    Objective:   BP (!) 151/77 (BP Location: Right Leg)   Pulse 89   Temp 98.8 F (37.1 C) (Axillary)   Resp 15   Ht 5\' 8"  (1.727 m)   Wt 66.9 kg   SpO2 90%   BMI 22.43 kg/m   Intake/Output Summary (Last 24 hours) at 04/13/2019 0854 Last data filed at 04/13/2019 0241 Gross per 24 hour  Intake 871 ml  Output 1250 ml  Net -379 ml   Weight change: -2.2 kg  Physical Exam:  Gen: appears chronically ill, on back, not responsive to commands CVS: RRR Resp: clear bilaterally no c/w/r Abd: soft  Ext: no LE edema Neuro: not responsive  Imaging: DG CHEST PORT 1 VIEW  Result Date: 04/12/2019 CLINICAL DATA:  Initial evaluation for acute shortness of breath. Wheezing. EXAM: PORTABLE CHEST 1 VIEW COMPARISON:  Prior radiograph from 04/06/2007. FINDINGS: Dual lumen right IJ approach central line in place with tip overlying the distal SVC. Moderate  cardiomegaly. Mediastinal silhouette within normal limits. Lungs normally inflated. Mild diffuse pulmonary interstitial congestion without frank pulmonary edema. No visible pleural effusion. No focal infiltrates. No pneumothorax. No acute osseous finding. IMPRESSION: Moderate cardiomegaly with mild diffuse pulmonary interstitial congestion without frank pulmonary edema. Electronically Signed   By: Jeannine Boga M.D.   On: 04/12/2019 04:22    Labs: BMET Recent Labs  Lab 04/07/19 0538 04/07/19 1453 04/08/19 0530 04/09/19 0553 04/10/19 0526 04/11/19 0703 04/12/19 0703 04/13/19 0425  NA 139 139 140 142 138 140 143 146*  K 3.9 3.6 4.3 4.5 4.6 4.3 4.1 4.5  CL 100 96* 98 99 99 101 106 110  CO2 24 30 26 22 22  21* 21* 20*  GLUCOSE 183* 122* 97 112* 147* 212* 151* 183*  BUN 146* 145* 138* 137* 127* 119* 75* 87*  CREATININE 10.50* 10.87* 11.17* 11.03* 10.16* 8.82* 5.72* 5.54*  CALCIUM 7.9* 8.0* 8.3* 8.7* 8.6* 9.0 8.7* 8.9  PHOS 5.4*  --  6.8* 6.9* 6.8* 5.5* 5.4* 6.2*   CBC Recent Labs  Lab 04/10/19 1038 04/11/19 0703 04/12/19 0703 04/13/19 0425  WBC 9.2 15.7* 17.0* 13.4*  NEUTROABS  --  13.7* 14.4* 11.2*  HGB 11.8* 11.5* 9.3* 9.5*  HCT 34.6* 33.8* 27.6* 29.1*  MCV 77.2* 78.4* 78.6* 80.8  PLT 277 335 278 300    Medications:    . amLODipine  10 mg Oral QPM  . Chlorhexidine Gluconate Cloth  6 each Topical Q0600  . cloNIDine  0.3  mg Transdermal Q Sun-1800  . heparin injection (subcutaneous)  5,000 Units Subcutaneous Q8H  . labetalol  5 mg Intravenous Once  . lidocaine (PF)  5 mL Other Once

## 2019-04-13 NOTE — Progress Notes (Signed)
Patient returned from HD via bed.  PIV access no longer in place.  New order placed for IV team to insert PIV.

## 2019-04-13 NOTE — Progress Notes (Addendum)
Subjective: NAEO. Plan for dialysis today. Spoke with patient's sister [name] and updated about current plan  ROS: Unable to obtain due to poor mental status  Examination  Vital signs in last 24 hours: Temp:  [97.9 F (36.6 C)-98.8 F (37.1 C)] 98.8 F (37.1 C) (01/04 0748) Pulse Rate:  [88-113] 89 (01/04 0748) Resp:  [15-29] 15 (01/04 0748) BP: (146-233)/(59-155) 151/77 (01/04 0748) SpO2:  [76 %-100 %] 90 % (01/04 0748) Weight:  [66.9 kg] 66.9 kg (01/04 0350)  General: lying in bed, NAD CVS: pulse-normal rate and rhythm RS: breathing comfortably, NAD Extremities: normal   Neuro: MS: opens eyes to repeated noxious stimuli, doesn't follow commands CN: pupils equal and reactive,  EOMI, face symmetric, tongue midline, normal sensation over face, Motor: antigravity strength in all 4 extremities Reflexes: 1+ bilaterally over patella, biceps, plantars: flexor  Basic Metabolic Panel: Recent Labs  Lab 04/09/19 0553 04/10/19 0526 04/10/19 1038 04/11/19 0703 04/12/19 0703 04/13/19 0425  NA 142 138  --  140 143 146*  K 4.5 4.6  --  4.3 4.1 4.5  CL 99 99  --  101 106 110  CO2 22 22  --  21* 21* 20*  GLUCOSE 112* 147*  --  212* 151* 183*  BUN 137* 127*  --  119* 75* 87*  CREATININE 11.03* 10.16*  --  8.82* 5.72* 5.54*  CALCIUM 8.7* 8.6*  --  9.0 8.7* 8.9  MG  --   --  2.1  --   --   --   PHOS 6.9* 6.8*  --  5.5* 5.4* 6.2*    CBC: Recent Labs  Lab 04/08/19 0530 04/10/19 1038 04/11/19 0703 04/12/19 0703 04/13/19 0425  WBC 9.4 9.2 15.7* 17.0* 13.4*  NEUTROABS  --   --  13.7* 14.4* 11.2*  HGB 12.4* 11.8* 11.5* 9.3* 9.5*  HCT 36.6* 34.6* 33.8* 27.6* 29.1*  MCV 78.2* 77.2* 78.4* 78.6* 80.8  PLT 229 277 335 278 300     Coagulation Studies: No results for input(s): LABPROT, INR in the last 72 hours.  Imaging MRI brain without contrast 04/07/2019:Diffusion-weighted and T2/FLAIR hyperintense signal abnormality within the medial temporal lobe/hippocampus as well as  posteromedial left thalamus. Mild associated parenchymal swelling. 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 MRI follow-up is recommended following seizure control to ensure resolution of signal abnormality and to exclude alternative etiologies (i.e. neoplasm). Chronic blood products along the right parietal lobe, possibly Posttraumatic.  CT head without contrast 04/09/2019: No acute abnormalities.   ASSESSMENT/PLAN:49 year old male with history of seizure disorder and marijuana use who presented with altered mental status for about a week,acute renal failure and elevated CK. Went into status epilepticus on Friday 04/10/2019 which has since resolved after Ativan and loading dose of Vimpat. LP was performed, -CSF showed 3 white cells, protein 53, glucose 62, HSV PCR negative, VZV PCR negative, ACE enzyme 16. Anti Jo Antibody, ds DNA, SSA, SSB, SCL 70, Anti SM antibody all negative.    Acute encephalopathy Uremia Transaminitis Focal motor convulsive status epilepticus ( resolved) Seizure - At this point, differentials include uremic encephalopathy vs autoimmune/paraneoplastic encephalopathy   Recommendations -Reduced vimpat to 75mg  BID to minimize sedation after discussion with hospitalist Dr Verlon Au.Marland Kitchen  Avoiding Keppra due to poor renal function. 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 - Will repeat MRI Brain on Wednesday after patient has completed 5 day course of steroids -As infectious work-up so  far seems negative, sent [facility] autoimmune and paraneoplastic panel -Unable to obtain studies with contrast at this point due to renal impairment, will plan to obtain CT chest and abdomen/pelvis with contrast for neoplastic/paraneoplastic work-up once renal function improves - HD per Nephro, HTN management per hospitalist -Continue seizure precautions -As needed IV  Ativan 2 mg for generalized tonic-clonic seizure lasting more than 2 minutes or focal seizure lasting more than 5 minutes -Continue management of other comorbidities per primary team  Thank you for allowing [place] to participate in the care of this patient. Neurology will follow. Please page neuro hospitalist for any further questions after 5 PM.   I have spent a total of 74minutes with the patient reviewing hospital notes,  test results, labs and examining the patient as well as establishing an assessment and plan that was discussed personally with the patient's team and sister on phone.  > 50% of time was spent in direct patient care.

## 2019-04-13 NOTE — Progress Notes (Signed)
Hospitalist daily note  [name] EF:[id number] DOB: 1970-06-24 DOA: 04/05/2019  PCP: Patient, No Pcp Per  Narrative:   49 year old remote history of recurrent partial seizures 2007, cannabis use Patient admitted for metabolic encephalopathy in the setting of acute renal failure with elevated LFTs as well as rhabdomyolysis-EEG ordered nephrology consulted as per neurologist On 04/10/2019 had 2 separate seizures early in the morning and then subsequently and neurology loaded him with high-dose Vimpat and start steroids He was sent to stepdown unit and received first dialysis on 04/11/2019  Data Reviewed:  BUN/creatinine from admission 115/10.3-->138/11.17-->137/11.03-->119/8.82--75/5.72--87/5.5 CK 687 Bicarb 20, anion gap 16 Hemoglobin 11-->9.5 WBC up to 17-->13.4 CT head 12/27-unremarkable CT Angiotensin converting enzyme 16, TSH 2.065, B12 359, CSF cell count and differential showed 3 WBC yellow, Gram stain showed no organisms no WBC Ultrasound abdomen 12/27 pelvis showed fatty liver no gallstones MRI brain 12/29 hyperintense signal abnormality within the medial temporal lobe as well as posterior medial left thalamus-?  HSV encephalitis or autoimmune encephalitis possibly superimposed seizure related edema chronic blood products along the right parietal lobe possibly posttraumatic  Assessment & Plan: Toxic metabolic encephalopathy -etiology unclear-could be uremia? work-up MRI  encephalitis may be autoimmune-so far anti-Jo, angiotensin-converting enzyme, HIV, HSV, VDRL all negative LP performed 12/30 and labs not suggestive of bacterial -acyclovir also d/c as HSV neg on CSF Patient started on steroids 1/1 Solu-Medrol 1 g every 24--completes on 1/5 Defer further imaging to Neurology  Possible aspiration? Possible UTI start zosyn 1/3 and monitor trends Ine showing >100,000 CFU E coli which should be covered by Zosyn--follow cult Would get SLP to see  non-emergently--otherwsie would keep NPO  except for meds  Recurrent seizures? Vimpat loaded higher dose 75 every 12 given seizures occurring on 12/31 EEG has been discontinued and was grossly normal 12/30--Previously  normal Long-term EEG started again on 1/1 but hasn't shown anything and LTM EEG to be d/c per neurologist later today Ativan 2 mg as needed for seizures Asking neurology if can cut back Vimpat to see if he wakes up some more  Acute kidney injury 2/2 rhabdo +2.2 L so far CK seems to be trending downwards from a peak 14,000 continuing LR 100 cc/h-->cut back rate to 50 cc/h getting dialysis since 1/2,a dn again on 1/4--2.5 hours each time.  Hope this will improve his confusion  Hypertensive urgency Continue hydralazine but increase dose to 10 every 4 as needed  Transaminitis Ultrasound liver fatty liver hepatitis panel normal other work-up normal-monitor trends  Hyperphosphatemia defer binders to renal  Marijuana use No other drugs positive on drug screen  HTN uncontrolled  Blood pressures elevated  higher dosing PRN labetalol and Use IV hydralazine as cannot give PO  Spoke with Sister 04/13/2019 and updated, Full code status, Heparin  Subjective:  Unintelligible but moving around in the bed more than prior and seems to open eyes more Cannot get ROS  Consultants:   Nephrology  Neurology Procedures:   As above Antimicrobials:   None   Objective: Vitals:   04/13/19 1300 04/13/19 1330 04/13/19 1400 04/13/19 1430  BP: (!) 198/112 (!) 178/93 (!) 199/84 (!) 202/93  Pulse: 79 84 95 89  Resp:      Temp:      TempSrc:      SpO2:      Weight:      Height:        Intake/Output Summary (Last 24 hours) at 04/13/2019 1443 Last data filed at 04/13/2019 0241 Gross per 24  hour  Intake 746 ml  Output 1250 ml  Net -504 ml   Filed Weights   04/12/19 0349 04/13/19 0350 04/13/19 1234  Weight: 69.1 kg 66.9 kg 64.2 kg    Examination: not really coherent but more awake writhing in the bed Pupils are  equally reactive to light Chest clinically clear no added sound no rales no rhonchi--poor exam as patient cannot turn in bed Abdomen soft Reflexes deferred today   Scheduled Meds: . amLODipine  10 mg Oral QPM  . Chlorhexidine Gluconate Cloth  6 each Topical Q0600  . cloNIDine  0.3 mg Transdermal Q Sun-1800  . heparin injection (subcutaneous)  5,000 Units Subcutaneous Q8H  . labetalol  5 mg Intravenous Once  . lidocaine (PF)  5 mL Other Once   Continuous Infusions: . sodium chloride    . sodium chloride    . lacosamide (VIMPAT) IV 100 mg (04/13/19 1117)  . lactated ringers 50 mL/hr at 04/13/19 0701  . methylPREDNISolone (SOLU-MEDROL) injection 1,000 mg (04/13/19 1007)  . piperacillin-tazobactam (ZOSYN)  IV 2.25 g (04/13/19 0935)     LOS: 8 days   Time spent: [place], MD [facility]

## 2019-04-14 ENCOUNTER — Inpatient Hospital Stay (HOSPITAL_COMMUNITY): Payer: Medicaid Other

## 2019-04-14 LAB — RENAL FUNCTION PANEL
Albumin: 2.6 g/dL — ABNORMAL LOW (ref 3.5–5.0)
Anion gap: 12 (ref 5–15)
BUN: 50 mg/dL — ABNORMAL HIGH (ref 6–20)
CO2: 24 mmol/L (ref 22–32)
Calcium: 8.5 mg/dL — ABNORMAL LOW (ref 8.9–10.3)
Chloride: 106 mmol/L (ref 98–111)
Creatinine, Ser: 3.28 mg/dL — ABNORMAL HIGH (ref 0.61–1.24)
GFR calc Af Amer: 24 mL/min — ABNORMAL LOW (ref >60)
GFR calc non Af Amer: 21 mL/min — ABNORMAL LOW (ref >60)
Glucose, Bld: 228 mg/dL — ABNORMAL HIGH (ref 70–99)
Phosphorus: 5.3 mg/dL — ABNORMAL HIGH (ref 2.5–4.6)
Potassium: 3.7 mmol/L (ref 3.5–5.1)
Sodium: 142 mmol/L (ref 135–145)

## 2019-04-14 LAB — URINE CULTURE: Culture: >=100000 — AB

## 2019-04-14 LAB — CBC
HCT: 28.9 % — ABNORMAL LOW (ref 39.0–52.0)
Hemoglobin: 9.5 g/dL — ABNORMAL LOW (ref 13.0–17.0)
MCH: 26.4 pg (ref 26.0–34.0)
MCHC: 32.9 g/dL (ref 30.0–36.0)
MCV: 80.3 fL (ref 80.0–100.0)
Platelets: 295 10*3/uL (ref 150–400)
RBC: 3.6 MIL/uL — ABNORMAL LOW (ref 4.22–5.81)
RDW: 13.3 % (ref 11.5–15.5)
WBC: 10.4 10*3/uL (ref 4.0–10.5)
nRBC: 0 % (ref 0.0–0.2)

## 2019-04-14 LAB — GLUCOSE, CAPILLARY
Glucose-Capillary: 138 mg/dL — ABNORMAL HIGH (ref 70–99)
Glucose-Capillary: 168 mg/dL — ABNORMAL HIGH (ref 70–99)
Glucose-Capillary: 245 mg/dL — ABNORMAL HIGH (ref 70–99)
Glucose-Capillary: 270 mg/dL — ABNORMAL HIGH (ref 70–99)
Glucose-Capillary: 378 mg/dL — ABNORMAL HIGH (ref 70–99)

## 2019-04-14 LAB — PROCALCITONIN: Procalcitonin: 0.16 ng/mL

## 2019-04-14 MED ORDER — INSULIN ASPART 100 UNIT/ML ~~LOC~~ SOLN
0.0000 [IU] | Freq: Every day | SUBCUTANEOUS | Status: DC
  Administered 2019-04-15: 3 [IU] via SUBCUTANEOUS
  Administered 2019-04-15: 2 [IU] via SUBCUTANEOUS

## 2019-04-14 MED ORDER — BOOST / RESOURCE BREEZE PO LIQD CUSTOM
1.0000 | Freq: Three times a day (TID) | ORAL | Status: DC
  Administered 2019-04-14 – 2019-04-18 (×12): 1 via ORAL

## 2019-04-14 MED ORDER — PRO-STAT SUGAR FREE PO LIQD
30.0000 mL | Freq: Two times a day (BID) | ORAL | Status: DC
  Administered 2019-04-14 – 2019-04-19 (×11): 30 mL via ORAL
  Filled 2019-04-14 (×11): qty 30

## 2019-04-14 MED ORDER — SODIUM CHLORIDE 0.9 % IV SOLN
1.0000 g | INTRAVENOUS | Status: DC
  Administered 2019-04-14: 1 g via INTRAVENOUS
  Filled 2019-04-14: qty 1
  Filled 2019-04-14: qty 10

## 2019-04-14 MED ORDER — AMLODIPINE BESYLATE 10 MG PO TABS
10.0000 mg | ORAL_TABLET | Freq: Every day | ORAL | Status: DC
  Administered 2019-04-14 – 2019-04-19 (×6): 10 mg via ORAL
  Filled 2019-04-14 (×6): qty 1

## 2019-04-14 NOTE — Progress Notes (Addendum)
Pt is drinking all liquids but has refused breakfast and lunch. Changed lunch to a grilled chicken sandwich per sister's request. However, patient refused this tray as well. RN asks pt what he would like to order for dinner. Pt refuses and requests cup of water. Cup of water given to pt.

## 2019-04-14 NOTE — Progress Notes (Signed)
Hospitalist daily note  [name] EF:[id number] DOB: [birth year] DOA: 04/05/2019  PCP: Patient, No Pcp Per  Narrative:   49 year old remote history of recurrent partial seizures 2007, cannabis use Patient admitted for metabolic encephalopathy in the setting of acute renal failure with elevated LFTs as well as rhabdomyolysis-EEG ordered nephrology consulted as per neurologist On 04/10/2019 had 2 separate seizures early in the morning and then subsequently and neurology loaded him with high-dose Vimpat and start steroids He was sent to stepdown unit and received first dialysis on 04/11/2019  Data Reviewed:  BUN/creatinine from admission 115/10.3-->138/11.17-->137/11.03-->119/8.82--75/5.72--87/5.5-->50/3.2 CK 687 Pct down from 0.22 to 0.16 Bicarb 24, anion gap 12 Hemoglobin 11-->9.5 WBC up to 17-->13.4--->10.4 CT head 12/27-unremarkable CT Angiotensin converting enzyme 16, TSH 2.065, B12 359, CSF cell count and differential showed 3 WBC yellow, Gram stain showed no organisms no WBC Ultrasound abdomen 12/27 pelvis showed fatty liver no gallstones MRI brain 12/29 hyperintense signal abnormality within the medial temporal lobe as well as posterior medial left thalamus-?  HSV encephalitis or autoimmune encephalitis possibly superimposed seizure related edema chronic blood products along the right parietal lobe possibly posttraumatic  Assessment & Plan: Toxic metabolic encephalopathy -etiology unclear-likely was 2/2 uremia? work-up MRI  encephalitis may be autoimmune-so far anti-Jo, angiotensin-converting enzyme, HIV, HSV, VDRL all negative LP performed 12/30 and labs not suggestive of bacterial -acyclovir also d/c as HSV neg on CSF Solu-Medrol 1 g every 24--completes on 1/5 imiaging MRI brain on 1/6 per neuro  Possible aspiration? Possible UTI--pan-sensitive E.coli Zosyn started 1/3 but stopped given no PNA on CXR 1/5 Will give ceftriaxone 1 g q 24 for possible Urinary infeciton Start soft diet  and monitor for s/sym aspiraiton as now more awake  Recurrent seizures? Vimpat  Dose adjusted as per Neuro on 1/4 to help with possible sleepiness EEG has been discontinued and was grossly normal 12/30--Previously  normal Long-term EEG started again on 1/1 but hasn't shown anything and LTM EEG to be d/c per neurologist later today Ativan 2 mg as needed for seizures  Acute kidney injury 2/2 rhabdo -4.1 L so far--dialyzed x 2 CK seems to be trending downwards from a peak 14,000 continuing LR 100 cc/h-->cut back rate to 50 cc/h  Hypertensive urgency Continue hydralazine but increase dose to 10 every 4 as needed Resume amlodipine at 10 mg  Transaminitis Ultrasound liver fatty liver hepatitis panel normal other work-up normal-monitor trends  Hyperphosphatemia defer binders to renal pho levels 5.3  Marijuana use No other drugs positive on drug screen  HTN uncontrolled  Blood pressures elevated  higher dosing PRN labetalol and Use IV hydralazine   Sister updated 1/4--they have spoken to neuro today and updated, Full code status, Heparin  Subjective:  More awake-slight confusion--think he needs urinal and perseverating on this despite having condom caht on Can drink without coughing--I tested this at bedsdie  Consultants:   Nephrology  Neurology Procedures:   As above Antimicrobials:   None   Objective: Vitals:   04/14/19 0800 04/14/19 0900 04/14/19 1003 04/14/19 1100  BP: (!) 155/60 (!) 167/60 (!) 180/78 125/64  Pulse: 79 77  75  Resp: 12 12    Temp: 98.3 F (36.8 C)     TempSrc: Oral     SpO2: 96% 97%  96%  Weight:      Height:        Intake/Output Summary (Last 24 hours) at 04/14/2019 1134 Last data filed at 04/14/2019 0630 Gross per 24 hour  Intake 266.08 ml  Output  3000 ml  Net -2733.92 ml   Filed Weights   04/13/19 1234 04/13/19 1522 04/14/19 0300  Weight: 64.2 kg 64.5 kg 67.7 kg    Examination:  coherent much more awake Pupils are equally  reactive to light Chest clinically clear no added sound  Abdomen soft Reflexes deferred today   Scheduled Meds: . amLODipine  10 mg Oral Daily  . Chlorhexidine Gluconate Cloth  6 each Topical Q0600  . cloNIDine  0.3 mg Transdermal Q Sun-1800  . feeding supplement  1 Container Oral TID BM  . feeding supplement (PRO-STAT SUGAR FREE 64)  30 mL Oral BID  . heparin injection (subcutaneous)  5,000 Units Subcutaneous Q8H  . insulin aspart  0-6 Units Subcutaneous TID WC  . insulin aspart  2 Units Subcutaneous TID WC  . labetalol  5 mg Intravenous Once  . lidocaine (PF)  5 mL Other Once   Continuous Infusions: . sodium chloride    . sodium chloride    . lacosamide (VIMPAT) IV 75 mg (04/14/19 1130)  . lactated ringers 50 mL/hr at 04/13/19 0701  . methylPREDNISolone (SOLU-MEDROL) injection 1,000 mg (04/14/19 1023)     LOS: 9 days   Time spent: [place], MD [facility]

## 2019-04-14 NOTE — Progress Notes (Addendum)
Subjective: No acute events overnight.  Patient had another round of hemodialysis yesterday.  More awake today, still has trouble remembering time accurately.  Requesting pancakes and orange for breakfast.  Denies any other concerns.  ROS: negative except above  Examination  Vital signs in last 24 hours: Temp:  [97.8 F (36.6 C)-99.1 F (37.3 C)] 98.3 F (36.8 C) (01/05 0800) Pulse Rate:  [71-95] 79 (01/05 0800) Resp:  [11-20] 12 (01/05 0800) BP: (128-219)/(60-112) 180/78 (01/05 1003) SpO2:  [82 %-99 %] 96 % (01/05 0800) Weight:  [64.2 kg-67.7 kg] 67.7 kg (01/05 0300)  General: lying in bed, not in apparent distress CVS: pulse-normal rate and rhythm RS: breathing comfortably Extremities: normal   Neuro: MS: Alert, oriented to place and person but not to time, follows commands CN: pupils equal and reactive,  EOMI, face symmetric, tongue midline, normal sensation over face Motor: Antigravity strength in all 4 extremities  Basic Metabolic Panel: Recent Labs  Lab 04/10/19 0526 04/10/19 1038 04/11/19 0703 04/12/19 0703 04/13/19 0425 04/14/19 0436  NA 138  --  140 143 146* 142  K 4.6  --  4.3 4.1 4.5 3.7  CL 99  --  101 106 110 106  CO2 22  --  21* 21* 20* 24  GLUCOSE 147*  --  212* 151* 183* 228*  BUN 127*  --  119* 75* 87* 50*  CREATININE 10.16*  --  8.82* 5.72* 5.54* 3.28*  CALCIUM 8.6*  --  9.0 8.7* 8.9 8.5*  MG  --  2.1  --   --   --   --   PHOS 6.8*  --  5.5* 5.4* 6.2* 5.3*   CBC: Recent Labs  Lab 04/10/19 1038 04/11/19 0703 04/12/19 0703 04/13/19 0425 04/14/19 0436  WBC 9.2 15.7* 17.0* 13.4* 10.4  NEUTROABS  --  13.7* 14.4* 11.2*  --   HGB 11.8* 11.5* 9.3* 9.5* 9.5*  HCT 34.6* 33.8* 27.6* 29.1* 28.9*  MCV 77.2* 78.4* 78.6* 80.8 80.3  PLT 277 335 278 300 295   Coagulation Studies: No results for input(s): LABPROT, INR in the last 72 hours.  Imaging MRI brain without contrast 04/07/2019:Diffusion-weighted and T2/FLAIR hyperintense signal abnormality  within the medial temporal lobe/hippocampus as well as posteromedial left thalamus. Mild associated parenchymal swelling. 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 MRI follow-up is recommended following seizure control to ensure resolution of signal abnormality and to exclude alternative etiologies (i.e. neoplasm).Chronic blood products along the right parietal lobe, possibly Posttraumatic.  CT head without contrast 04/09/2019: No acute abnormalities.   ASSESSMENT/PLAN:49 year old male with history of seizure disorder and marijuana use who presented with altered mental status for about a week,acute renal failure and elevated CK. Went into status epilepticus on Friday 04/10/2019 which has since resolved after Ativan and loading dose of Vimpat. LP was performed, -CSF showed 3 white cells, protein 53, glucose 62, HSV PCR negative, VZV PCR negative, ACE enzyme 16. Anti Jo Antibody, ds DNA, SSA, SSB, SCL 70, Anti SM antibody all negative.    Acute encephalopathy (improving) Uremia Transaminitis Focal motor convulsive status epilepticus ( resolved) Seizure - At this point, differentials include uremic encephalopathy vs autoimmune/paraneoplastic encephalopathy   Recommendations -Continue Vimpat 75mg  BID.  Will likely switch to another agent like oxcarbazepine or Keppra after patient's renal function improves.  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  We will check with nephrology when it might be safe for patient to receive MRI contrast. -As infectious work-up so far seems negative, sent [facility] autoimmune and paraneoplastic panel -Unable to obtain studies with contrast at this point due to renal impairment, will plan to obtain CT chest and abdomen/pelvis with contrast for  neoplastic/paraneoplastic work-up once renal function improves - HD per Nephro, HTN management per hospitalist -I called and updated patient's sister Darr Ranft this morning.  She requested social work consult to help patient with insurance.  She also requested social worker to give her a call to help facilitate transition of care. -Continue seizure precautions -As needed IV Ativan 2 mg for generalized tonic-clonic seizure lasting more than 2 minutes or focal seizure lasting more than 5 minutes -Continue management of other comorbidities per primary team  Thank you for allowing [place] to participate in the care of this patient. Neurology will follow. Please page neuro hospitalist for any further questions after 5 PM.   I have spent a total of25 minuteswith the patient reviewing hospitalnotes,  test results, labs and examining the patient as well as establishing an assessment and plan that was discussed personally with the patient's team and sister on phone.>50% of time was spent in direct patient care.

## 2019-04-14 NOTE — Progress Notes (Signed)
Initial Nutrition Assessment  INTERVENTION:   -Recommend Speech therapy consult now that pt is more alert/awake to consume POs -Boost Breeze po TID, each supplement provides 250 kcal and 9 grams of protein -Prostat liquid protein PO 30 ml BID with meals, each supplement provides 100 kcal, 15 grams protein.  NUTRITION DIAGNOSIS:   Inadequate oral intake related to lethargy/confusion as evidenced by meal completion < 25%, per patient/family report.  GOAL:   Patient will meet greater than or equal to 90% of their needs  MONITOR:   PO intake, Supplement acceptance, Weight trends, I & O's, Labs  REASON FOR ASSESSMENT:   (On list to receive Cortrak tube placement)    ASSESSMENT:   49 y.o. 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 Admitted for acute renal failure.  **RD working remotely**  Patient has order for Cortrak tube placement. Per chart review and discussion with RD placing Cortraks today, pt's RN reports pt is more alert/awake today and is requesting to eat and drink. Attempted to speak with pt over the phone but no answer. Will order Boost Breeze and Prostat supplements to provide additional kcals and protein along with meals.   Per chart review, pt was not eating well PTA d/t AMS. Pt had TDC placed 1/1 and began HD 1/2. Last HD 1/4. UF was 500 ml.  Per RN, pt was hungry for breakfast of pancakes and an orange. Pt was not provided any of these items for breakfast so he did not eat that much. Pancakes are allowed on his diet so uncertain why he wasn't provided his ordered items.  Admission weight: 143 lbs. Current weight: 149 lbs.  No weight loss noted in weight records.  I/Os:-514 ml since admit UOP: 3L x 24 hrs  Medications reviewed. Labs reviewed: CBGs: 138-168 Elevated Phos  NUTRITION - FOCUSED PHYSICAL EXAM:    Most  Recent Value  Orbital Region  No depletion  Upper Arm Region  No depletion  Thoracic and Lumbar Region  No depletion  Buccal Region  Mild depletion  Temple Region  No depletion  Clavicle Bone Region  Mild depletion  Clavicle and Acromion Bone Region  Mild depletion  Scapular Bone Region  Unable to assess  Dorsal Hand  No depletion  Patellar Region  No depletion  Anterior Thigh Region  Unable to assess  Posterior Calf Region  Mild depletion  Edema (RD Assessment)  None  Hair  Reviewed  Eyes  Reviewed  Mouth  Reviewed  Skin  Reviewed  Nails  Reviewed    -Completed via RD on Cortrak team.   Diet Order:   Diet Order            DIET SOFT Room service appropriate? Yes; Fluid consistency: Thin  Diet effective now              EDUCATION NEEDS:   Not appropriate for education at this time  Skin:  Skin Assessment: Reviewed RN Assessment  Last BM:  1/4  Height:   Ht Readings from Last 1 Encounters:  04/06/19 5\' 8"  (1.727 m)    Weight:   Wt Readings from Last 1 Encounters:  04/14/19 67.7 kg    Ideal Body Weight:  70 kg  BMI:  Body mass index is 22.69 kg/m.  Estimated Nutritional Needs:   Kcal:  1650-1850  Protein:  80-90g  Fluid:  UOP +1L  Clayton Bibles, MS, RD, LDN Inpatient Clinical Dietitian Pager: [phone] After Hours Pager: [phone]

## 2019-04-14 NOTE — Progress Notes (Signed)
   Vital Signs MEWS/VS Documentation      04/14/2019 0400 04/14/2019 0757 04/14/2019 0800 04/14/2019 1003   MEWS Score:  0  0  3  3   MEWS Score Color:  Green  Green  Yellow  Yellow   Resp:  --  --  12  --   Pulse:  --  --  79  --   BP:  --  --  (!) 155/60  (!) 180/78   Temp:  --  --  98.3 F (36.8 C)  --   Level of Consciousness:  Alert  --  Responds to Pain  --      PRN Hydralazine administered for BP [id number] systolic. No s/s distress. No complaints from patient. He requested a cup of water, water given to patient. Will continue to monitor.     Ayva Veilleux 04/14/2019,10:43 AM

## 2019-04-14 NOTE — Progress Notes (Signed)
[facility] Progress Note    Assessment/ Plan:   1. Severe AKI secondary to rhabdomyolysis--> with evidence of uremia prompting initiation of RRT.  S/p [facility] 04/10/2019.  HD 04/11/2019, 1/4.  UA with no cells on admission- would doubt GN in this situation. With improving UOP I'm hopeful this is the start of recovery.  Will continue to assess daily need for dialysis.   2. Mild hyperkalemia, resolved 3. Metabolic acidosis improved 4. Rhabdomyolysis-- etiology unclear-- atypical story for status epilepticus 5. Seizure disorder-- per neurology.  6. Confusional state, amnesia,  LP 04/08/19 which is largely negative.   7. Hyperuricemia, ? Related to rhabdo 8. Hypertension:  amlodipine QHS added but not taking po so for now has IV PRNs and clonidine patch was added 1/4.  9. Anemia:  Hb stable in 9s.   Subjective:    UOP 3L yesterday; afebrile.  On zosyn for PNA now.  BPs improved. Now interactive.   Objective:   BP 128/73 (BP Location: Left Arm)   Pulse 94   Temp 98.2 F (36.8 C) (Other (Comment))   Resp 17   Ht 5\' 8"  (1.727 m)   Wt 67.7 kg   SpO2 94%   BMI 22.69 kg/m   Intake/Output Summary (Last 24 hours) at 04/14/2019 0815 Last data filed at 04/14/2019 0630 Gross per 24 hour  Intake 266.08 ml  Output 3000 ml  Net -2733.92 ml   Weight change: -2.7 kg  Physical Exam:  Gen: appears chronically ill, now awake and alerty CVS: RRR Resp: clear bilaterally no c/w/r Abd: soft  Ext: no LE edema Neuro: awake, knows name but otherwise not oriented.    Imaging: DG CHEST PORT 1 VIEW  Result Date: 04/14/2019 CLINICAL DATA:  Pneumonia. Acute renal failure. Altered mental status. EXAM: PORTABLE CHEST 1 VIEW COMPARISON:  05/10/2019. FINDINGS: Dual-lumen right central line in stable position. Heart size stable. Low lung volumes. Improved aeration of both lungs from prior exam. No pleural effusion or pneumothorax. IMPRESSION: 1.  Dual-lumen right central line in stable position. 2.  Low lung volumes. Improved aeration of both lungs from prior exam. No focal infiltrate. Electronically Signed   ByMarcello Moores  Register   On: 04/14/2019 07:50    Labs: BMET Recent Labs  Lab 04/08/19 0530 04/09/19 [id number] 04/10/19 0526 04/11/19 0703 04/12/19 0703 04/13/19 0425 04/14/19 0436  NA 140 142 138 140 143 146* 142  K 4.3 4.5 4.6 4.3 4.1 4.5 3.7  CL 98 99 99 101 106 110 106  CO2 26 22 22  21* 21* 20* 24  GLUCOSE 97 112* 147* 212* 151* 183* 228*  BUN 138* 137* 127* 119* 75* 87* 50*  CREATININE 11.17* 11.03* 10.16* 8.82* 5.72* 5.54* 3.28*  CALCIUM 8.3* 8.7* 8.6* 9.0 8.7* 8.9 8.5*  PHOS 6.8* 6.9* 6.8* 5.5* 5.4* 6.2* 5.3*   CBC Recent Labs  Lab 04/11/19 0703 04/12/19 0703 04/13/19 0425 04/14/19 0436  WBC 15.7* 17.0* 13.4* 10.4  NEUTROABS 13.7* 14.4* 11.2*  --   HGB 11.5* 9.3* 9.5* 9.5*  HCT 33.8* 27.6* 29.1* 28.9*  MCV 78.4* 78.6* 80.8 80.3  PLT 335 278 300 295    Medications:    . Chlorhexidine Gluconate Cloth  6 each Topical Q0600  . cloNIDine  0.3 mg Transdermal Q Sun-1800  . heparin injection (subcutaneous)  5,000 Units Subcutaneous Q8H  . insulin aspart  0-6 Units Subcutaneous TID WC  . insulin aspart  2 Units Subcutaneous TID WC  . labetalol  5 mg Intravenous Once  .  lidocaine (PF)  5 mL Other Once

## 2019-04-15 ENCOUNTER — Inpatient Hospital Stay (HOSPITAL_COMMUNITY): Payer: Medicaid Other

## 2019-04-15 ENCOUNTER — Telehealth: Payer: Self-pay

## 2019-04-15 LAB — CBC
HCT: 25.8 % — ABNORMAL LOW (ref 39.0–52.0)
Hemoglobin: 8.3 g/dL — ABNORMAL LOW (ref 13.0–17.0)
MCH: 26.1 pg (ref 26.0–34.0)
MCHC: 32.2 g/dL (ref 30.0–36.0)
MCV: 81.1 fL (ref 80.0–100.0)
Platelets: 254 10*3/uL (ref 150–400)
RBC: 3.18 MIL/uL — ABNORMAL LOW (ref 4.22–5.81)
RDW: 13.1 % (ref 11.5–15.5)
WBC: 9.5 10*3/uL (ref 4.0–10.5)
nRBC: 0 % (ref 0.0–0.2)

## 2019-04-15 LAB — RENAL FUNCTION PANEL
Albumin: 2.6 g/dL — ABNORMAL LOW (ref 3.5–5.0)
Anion gap: 15 (ref 5–15)
BUN: 71 mg/dL — ABNORMAL HIGH (ref 6–20)
CO2: 22 mmol/L (ref 22–32)
Calcium: 8.3 mg/dL — ABNORMAL LOW (ref 8.9–10.3)
Chloride: 104 mmol/L (ref 98–111)
Creatinine, Ser: 3.39 mg/dL — ABNORMAL HIGH (ref 0.61–1.24)
GFR calc Af Amer: 23 mL/min — ABNORMAL LOW (ref >60)
GFR calc non Af Amer: 20 mL/min — ABNORMAL LOW (ref >60)
Glucose, Bld: 162 mg/dL — ABNORMAL HIGH (ref 70–99)
Phosphorus: 4.8 mg/dL — ABNORMAL HIGH (ref 2.5–4.6)
Potassium: 3.3 mmol/L — ABNORMAL LOW (ref 3.5–5.1)
Sodium: 141 mmol/L (ref 135–145)

## 2019-04-15 LAB — HEMOGLOBIN A1C
Hgb A1c MFr Bld: 7.5 % — ABNORMAL HIGH (ref 4.8–5.6)
Mean Plasma Glucose: 168.55 mg/dL

## 2019-04-15 LAB — CBC WITH DIFFERENTIAL/PLATELET
Abs Immature Granulocytes: 0.09 10*3/uL — ABNORMAL HIGH (ref 0.00–0.07)
Basophils Absolute: 0 10*3/uL (ref 0.0–0.1)
Basophils Relative: 0 %
Eosinophils Absolute: 0 10*3/uL (ref 0.0–0.5)
Eosinophils Relative: 0 %
HCT: 27.7 % — ABNORMAL LOW (ref 39.0–52.0)
Hemoglobin: 8.9 g/dL — ABNORMAL LOW (ref 13.0–17.0)
Immature Granulocytes: 1 %
Lymphocytes Relative: 15 %
Lymphs Abs: 1.3 10*3/uL (ref 0.7–4.0)
MCH: 25.7 pg — ABNORMAL LOW (ref 26.0–34.0)
MCHC: 32.1 g/dL (ref 30.0–36.0)
MCV: 80.1 fL (ref 80.0–100.0)
Monocytes Absolute: 0.6 10*3/uL (ref 0.1–1.0)
Monocytes Relative: 6 %
Neutro Abs: 6.7 10*3/uL (ref 1.7–7.7)
Neutrophils Relative %: 78 %
Platelets: 289 10*3/uL (ref 150–400)
RBC: 3.46 MIL/uL — ABNORMAL LOW (ref 4.22–5.81)
RDW: 13.3 % (ref 11.5–15.5)
WBC: 8.6 10*3/uL (ref 4.0–10.5)
nRBC: 0 % (ref 0.0–0.2)

## 2019-04-15 LAB — GLUCOSE, CAPILLARY
Glucose-Capillary: 184 mg/dL — ABNORMAL HIGH (ref 70–99)
Glucose-Capillary: 233 mg/dL — ABNORMAL HIGH (ref 70–99)
Glucose-Capillary: 269 mg/dL — ABNORMAL HIGH (ref 70–99)
Glucose-Capillary: 280 mg/dL — ABNORMAL HIGH (ref 70–99)
Glucose-Capillary: 415 mg/dL — ABNORMAL HIGH (ref 70–99)

## 2019-04-15 MED ORDER — LOPERAMIDE HCL 2 MG PO CAPS: 2.0000 mg | ORAL_CAPSULE | Freq: Every day | ORAL | Status: DC | PRN

## 2019-04-15 MED ORDER — POTASSIUM CHLORIDE CRYS ER 20 MEQ PO TBCR
40.0000 meq | EXTENDED_RELEASE_TABLET | Freq: Once | ORAL | Status: AC
  Administered 2019-04-15: 40 meq via ORAL
  Filled 2019-04-15: qty 2

## 2019-04-15 MED ORDER — INSULIN ASPART 100 UNIT/ML ~~LOC~~ SOLN
4.0000 [IU] | Freq: Three times a day (TID) | SUBCUTANEOUS | Status: DC
  Administered 2019-04-15 – 2019-04-17 (×5): 4 [IU] via SUBCUTANEOUS

## 2019-04-15 MED ORDER — PANTOPRAZOLE SODIUM 40 MG IV SOLR
40.0000 mg | Freq: Two times a day (BID) | INTRAVENOUS | Status: DC
  Administered 2019-04-15 – 2019-04-17 (×6): 40 mg via INTRAVENOUS
  Filled 2019-04-15 (×6): qty 40

## 2019-04-15 MED ORDER — LEVETIRACETAM IN NACL 1000 MG/100ML IV SOLN
1000.0000 mg | Freq: Once | INTRAVENOUS | Status: AC
  Administered 2019-04-15: 1000 mg via INTRAVENOUS
  Filled 2019-04-15: qty 100

## 2019-04-15 MED ORDER — LEVETIRACETAM 500 MG PO TABS
500.0000 mg | ORAL_TABLET | Freq: Two times a day (BID) | ORAL | Status: DC
  Administered 2019-04-16 – 2019-04-19 (×7): 500 mg via ORAL
  Filled 2019-04-15 (×7): qty 1

## 2019-04-15 MED ORDER — CLONIDINE HCL 0.2 MG PO TABS
0.2000 mg | ORAL_TABLET | Freq: Two times a day (BID) | ORAL | Status: DC
  Administered 2019-04-15: 0.2 mg via ORAL
  Filled 2019-04-15: qty 1

## 2019-04-15 NOTE — Plan of Care (Signed)
  Problem: Education: Goal: Knowledge of General Education information will improve Description: Including pain rating scale, medication(s)/side effects and non-pharmacologic comfort measures Outcome: Progressing   Problem: Clinical Measurements: Goal: Ability to maintain clinical measurements within normal limits will improve Outcome: Progressing Goal: Will remain free from infection Outcome: Progressing Goal: Respiratory complications will improve Outcome: Progressing Goal: Cardiovascular complication will be avoided Outcome: Progressing   Problem: Activity: Goal: Risk for activity intolerance will decrease Outcome: Progressing   Problem: Coping: Goal: Level of anxiety will decrease Outcome: Progressing   Problem: Elimination: Goal: Will not experience complications related to bowel motility Outcome: Progressing Goal: Will not experience complications related to urinary retention Outcome: Progressing   Problem: Pain Managment: Goal: General experience of comfort will improve Outcome: Progressing   Problem: Safety: Goal: Ability to remain free from injury will improve Outcome: Progressing   Problem: Skin Integrity: Goal: Risk for impaired skin integrity will decrease Outcome: Progressing   Problem: Health Behavior/Discharge Planning: Goal: Ability to manage health-related needs will improve Outcome: Not Progressing   Problem: Clinical Measurements: Goal: Diagnostic test results will improve Outcome: Not Progressing   Problem: Nutrition: Goal: Adequate nutrition will be maintained Outcome: Not Progressing

## 2019-04-15 NOTE — Plan of Care (Signed)
  Problem: Clinical Measurements: Goal: Ability to maintain clinical measurements within normal limits will improve Outcome: Progressing   Problem: Clinical Measurements: Goal: Respiratory complications will improve Outcome: Progressing   Problem: Clinical Measurements: Goal: Cardiovascular complication will be avoided Outcome: Progressing   Problem: Activity: Goal: Risk for activity intolerance will decrease Outcome: Not Progressing   Problem: Nutrition: Goal: Adequate nutrition will be maintained Outcome: Not Progressing

## 2019-04-15 NOTE — Progress Notes (Addendum)
Subjective: No acute events overnight.  Still complaining of headache this morning.  Denies any other concerns.  ROS: negative except above  Examination  Vital signs in last 24 hours: Temp:  [97.8 F (36.6 C)-98.3 F (36.8 C)] 98.1 F (36.7 C) (01/06 0800) Pulse Rate:  [75-99] 83 (01/06 0800) Resp:  [8-18] 12 (01/06 0800) BP: (125-156)/(56-88) 156/79 (01/06 0800) SpO2:  [96 %-100 %] 99 % (01/06 0800) Weight:  [66.4 kg] 66.4 kg (01/06 0500)   General: lying in bed, not in apparent distress CVS: pulse-normal rate and rhythm RS: breathing comfortably Extremities: normal   Neuro: MS: Alert, oriented to place and person but not to time, follows commands CN: pupils equal and reactive,  EOMI, face symmetric, tongue midline, normal sensation over face Motor: Antigravity strength in all 4 extremities  Basic Metabolic Panel: Recent Labs  Lab 04/10/19 1038 04/11/19 0703 04/12/19 0703 04/13/19 0425 04/14/19 0436 04/15/19 0301  NA  --  140 143 146* 142 141  K  --  4.3 4.1 4.5 3.7 3.3*  CL  --  101 106 110 106 104  CO2  --  21* 21* 20* 24 22  GLUCOSE  --  212* 151* 183* 228* 162*  BUN  --  119* 75* 87* 50* 71*  CREATININE  --  8.82* 5.72* 5.54* 3.28* 3.39*  CALCIUM  --  9.0 8.7* 8.9 8.5* 8.3*  MG 2.1  --   --   --   --   --   PHOS  --  5.5* 5.4* 6.2* 5.3* 4.8*    CBC: Recent Labs  Lab 04/11/19 0703 04/12/19 0703 04/13/19 0425 04/14/19 0436 04/15/19 0301  WBC 15.7* 17.0* 13.4* 10.4 8.6  NEUTROABS 13.7* 14.4* 11.2*  --  6.7  HGB 11.5* 9.3* 9.5* 9.5* 8.9*  HCT 33.8* 27.6* 29.1* 28.9* 27.7*  MCV 78.4* 78.6* 80.8 80.3 80.1  PLT 335 278 300 295 289     Coagulation Studies: No results for input(s): LABPROT, INR in the last 72 hours.  Imaging MRI brain without contrast 04/07/2019:Diffusion-weighted and T2/FLAIR hyperintense signal abnormality within the medial temporal lobe/hippocampus as well as posteromedial left thalamus. Mild associated parenchymal swelling.  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 MRI follow-up is recommended following seizure control to ensure resolution of signal abnormality and to exclude alternative etiologies (i.e. neoplasm).Chronic blood products along the right parietal lobe, possibly Posttraumatic.  CT head without contrast 04/09/2019: No acute abnormalities.   ASSESSMENT/PLAN:49 year old male with history of seizure disorder and marijuana use who presented with altered mental status for about a week,acute renal failure and elevated CK.Went into status epilepticus on Friday 04/10/2019 which has since resolvedafter Ativan and loading dose of Vimpat.LP was performed,-CSF showed 3 white cells, protein 53, glucose 62, HSV PCR negative, VZV PCR negative, ACE enzyme 16.Anti Jo Antibody, ds DNA, SSA, SSB, SCL 70, Anti SM antibody all negative.   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 -Spoke with case manager, patient does not have insurance.  Therefore Vimpat will cost over $800 which patient will be unable to afford.  As his renal function has been improving, we will give patient a loading dose of Keppra and start him on maintenance Keppra 500 mg  twice daily starting this evening.  -We will obtain repeat MRI brain without contrast today to assess if MRI brain stable versus improved compared to previous scan.   -PT/OT and swallow consult for rehab recommendations - HD per Nephro, HTN management per hospitalist -I called and updated patient's  sister Merlon Vozza this morning and shared our social workers number with her to help set up care post discharge. -Continue seizure precautions -As needed IV Ativan 2 mg for generalized tonic-clonic seizure lasting more than 2 minutes or focal seizure lasting more than 5 minutes -Continue management of other comorbidities per primary team  ADDENDUM -Reviewed repeat MRI brain without contrast.  Interval worsening of MRI per report.  However, clinically patient is significantly improved.  Therefore I believe the worsening seen on the imaging is reflective of the clinical worsening patient had on 04/10/2019 which has since improved.  -We will repeat MRI brain if possible with and without contrast in 4 to 6 weeks to assess for interval changes.   -Strict blood pressure control with goal SBP less than 140 -Rest of the management as above  Thank you for allowing [place] to participate in the care of this patient. Neurology will follow. Please page neuro hospitalist for any further questions after 5 PM.   I have spent a total of35 minuteswith the patient reviewing hospitalnotes, test results, labs and examining the patient as well as establishing an assessment and plan that was discussed personally with the patient's team and sister on phone.>50% of time was spent in direct patient care.

## 2019-04-15 NOTE — Telephone Encounter (Signed)
Call received from Elenor Quinones, RN CM requesting a hospital follow up appointment for the patient.  Informed her that an appt has been scheduled for 04/30/2019 @ 1430 @ [facility].

## 2019-04-15 NOTE — Progress Notes (Signed)
[facility] Progress Note    Assessment/ Plan:   1. Severe AKI secondary to rhabdomyolysis--> with evidence of uremia prompting initiation of RRT.  S/p [facility] 04/10/2019.  HD 04/11/2019, 1/4.  UA with no cells on admission- would doubt GN in this situation. Cr stable in the mid 3s and UOP is improved.  No need for dialysis now.  Will remove HD cath in next day if still improved.  2. Mild hyperkalemia, resolved 3. Metabolic acidosis improved 4. Rhabdomyolysis-- etiology unclear-- atypical story for status epilepticus 5. Seizure disorder-- per neurology.  6. Confusional state, amnesia,  LP 04/08/19 which is largely negative.  Improving. Neurology following and given empiric solumedrol x 5 days for possible autoimmune vs paraneoplastic process.  MRI planned soon. 7. Hyperuricemia, ? Related to rhabdo 8. Hypertension:  Improved with clonidine patch and amlodipine.  Now that taking po can switch to more po meds if needed in coming days.  9. Anemia:  Hb stable in 9s.   Subjective:    UOP 1.7L yesterday; afebrile.   BPs improved. Now interactive.   Objective:   BP (!) 156/84 (BP Location: Right Arm)   Pulse 81   Temp 98 F (36.7 C)   Resp (!) 8   Ht 5\' 8"  (1.727 m)   Wt 66.4 kg   SpO2 100%   BMI 22.26 kg/m   Intake/Output Summary (Last 24 hours) at 04/15/2019 0830 Last data filed at 04/15/2019 0300 Gross per 24 hour  Intake 3010.13 ml  Output 1701 ml  Net 1309.13 ml   Weight change: 2.2 kg  Physical Exam:  Gen: appears chronically ill, now awake and alert CVS: RRR Resp: clear bilaterally no c/w/r Abd: soft  Ext: no LE edema Neuro: awake, oriented to person place but not year; much improved  Imaging: DG CHEST PORT 1 VIEW  Result Date: 04/14/2019 CLINICAL DATA:  Pneumonia. Acute renal failure. Altered mental status. EXAM: PORTABLE CHEST 1 VIEW COMPARISON:  05/10/2019. FINDINGS: Dual-lumen right central line in stable position. Heart size stable. Low lung volumes.  Improved aeration of both lungs from prior exam. No pleural effusion or pneumothorax. IMPRESSION: 1.  Dual-lumen right central line in stable position. 2. Low lung volumes. Improved aeration of both lungs from prior exam. No focal infiltrate. Electronically Signed   By: Marcello Moores  Register   On: 04/14/2019 07:50    Labs: BMET Recent Labs  Lab 04/09/19 [id number] 04/10/19 [id number] 04/11/19 0703 04/12/19 0703 04/13/19 0425 04/14/19 0436 04/15/19 0301  NA 142 138 140 143 146* 142 141  K 4.5 4.6 4.3 4.1 4.5 3.7 3.3*  CL 99 99 101 106 110 106 104  CO2 22 22 21* 21* 20* 24 22  GLUCOSE 112* 147* 212* 151* 183* 228* 162*  BUN 137* 127* 119* 75* 87* 50* 71*  CREATININE 11.03* 10.16* 8.82* 5.72* 5.54* 3.28* 3.39*  CALCIUM 8.7* 8.6* 9.0 8.7* 8.9 8.5* 8.3*  PHOS 6.9* 6.8* 5.5* 5.4* 6.2* 5.3* 4.8*   CBC Recent Labs  Lab 04/11/19 0703 04/12/19 0703 04/13/19 0425 04/14/19 0436 04/15/19 0301  WBC 15.7* 17.0* 13.4* 10.4 8.6  NEUTROABS 13.7* 14.4* 11.2*  --  6.7  HGB 11.5* 9.3* 9.5* 9.5* 8.9*  HCT 33.8* 27.6* 29.1* 28.9* 27.7*  MCV 78.4* 78.6* 80.8 80.3 80.1  PLT 335 278 300 295 289    Medications:    . amLODipine  10 mg Oral Daily  . Chlorhexidine Gluconate Cloth  6 each Topical Q0600  . cloNIDine  0.3 mg Transdermal  Q Sun-1800  . feeding supplement  1 Container Oral TID BM  . feeding supplement (PRO-STAT SUGAR FREE 64)  30 mL Oral BID  . heparin injection (subcutaneous)  5,000 Units Subcutaneous Q8H  . insulin aspart  0-5 Units Subcutaneous QHS  . insulin aspart  0-6 Units Subcutaneous TID WC  . insulin aspart  2 Units Subcutaneous TID WC  . labetalol  5 mg Intravenous Once  . lidocaine (PF)  5 mL Other Once  . potassium chloride  40 mEq Oral Once

## 2019-04-15 NOTE — Progress Notes (Signed)
Patient being transport to MRI with transport and SWAT nurse at this time.  No s/s of distress noted denies pain.

## 2019-04-15 NOTE — Progress Notes (Addendum)
PROGRESS NOTE    [name]  [id number] DOB: 03-08-1971 DOA: 04/05/2019 PCP: Patient, No Pcp Per  Brief Narrative: 49 year old male with history of partial seizures, not on antiepileptic therapy, cannabis use was admitted with metabolic encephalopathy in the setting of acute kidney injury, uremia, rhabdomyolysis.  His BUN was as high as 159, creatinine peaked at 11.5 -On 1/1 he had 2 seizure episodes, neurology was consulted, MRI brain was abnormal, he was started on Vimpat and high-dose IV steroids for 5 days. 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, his LP was benign, MRI was abnormal    Assessment & Plan:   Severe acute kidney injury/metabolic encephalopathy -Admitted with creatinine greater than 10 and BUN of 140, with clinical evidence of uremia, metabolic encephalopathy -Rhabdomyolysis suspected to be the etiology -Nephrology consulted, he had a TDC placed on 1/1 and started hemodialysis on 1/2 -Urinalysis with no cells, bland sediment -, Last dialysis was on 1/4, now kidney function is improving and urine output is also improving -Anticipate renal recovery  Mild hyperkalemia -Resolved  Encephalopathy and seizures -Neurology consulting, he was being treated for 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 -Case management consulted for medication assistance, needs PCP as well -PT OT eval  Anemia of chronic disease -Stable  Hypertension -Improved on clonidine patch and amlodipine  ? UTI -On IV ceftriaxone, asymptomatic -Today is day 3 of antibiotics, stop  antibiotics today  Diarrhea -Likely antibiotic associated, monitor  Cannabis use  Hyperglycemia -? DM vs steroid induced -novolog with meals and SSI -check A1c  DVT prophylaxis: Heparin subcutaneous Code Status: Full code Family Communication: No family at bedside Disposition Plan: Home pending continued improvement in kidney function and encephalopathy  Consultants:   Nephrology  Neurology   Procedures: [facility] catheter 1/1 and hemodialysis  Antimicrobials:    Subjective: -Complains of some diarrhea, otherwise feels okay, no nausea vomiting, mild headache, -Denies any dyspnea  Objective: Vitals:   04/14/19 2350 04/15/19 0339 04/15/19 0500 04/15/19 0800  BP: (!) 150/88 (!) 156/84  (!) 156/79  Pulse: 80 81  83  Resp: 18 (!) 8  12  Temp: 98 F (36.7 C) 98 F (36.7 C)  98.1 F (36.7 C)  TempSrc: Oral   Oral  SpO2: 98% 100%  99%  Weight:   66.4 kg   Height:        Intake/Output Summary (Last 24 hours) at 04/15/2019 1135 Last data filed at 04/15/2019 0300 Gross per 24 hour  Intake 2628.53 ml  Output 1701 ml  Net 927.53 ml   Filed Weights   04/13/19 1522 04/14/19 0300 04/15/19 0500  Weight: 64.5 kg 67.7 kg 66.4 kg    Examination:  General exam: Thinly built pleasant male, sitting up in bed, AAOx3, no distress Respiratory system: Clear  cardiovascular system: S1 & S2 heard, RRR.  Left chest HD catheter noted  Gastrointestinal system: Abdomen is nondistended, soft and nontender.Normal bowel sounds heard. Central nervous system: No edema Extremities: Symmetric 5 x 5 power. Skin: No rashes, lesions or ulcers Psychiatry: Judgement and insight appear normal. Mood & affect appropriate.     Data Reviewed:   CBC: Recent Labs  Lab 04/11/19 0703 04/12/19  [id number] 04/13/19 0425 04/14/19 0436 04/15/19 0301  WBC 15.7* 17.0* 13.4* 10.4 8.6  NEUTROABS 13.7* 14.4* 11.2*  --  6.7  HGB 11.5* 9.3* 9.5* 9.5* 8.9*  HCT 33.8* 27.6* 29.1* 28.9* 27.7*  MCV 78.4* 78.6*  80.8 80.3 80.1  PLT 335 278 300 295 [id number]   Basic Metabolic Panel: Recent Labs  Lab 04/10/19 1038 04/11/19 0703 04/12/19 0703 04/13/19 0425 04/14/19 0436 04/15/19 0301  NA  --  140 143 146* 142 141  K  --  4.3 4.1 4.5 3.7 3.3*  CL  --  101 106 110 106 104  CO2  --  21* 21* 20* 24 22  GLUCOSE  --  212* 151* 183* 228* 162*  BUN  --  119* 75* 87* 50* 71*  CREATININE  --  8.82* 5.72* 5.54* 3.28* 3.39*  CALCIUM  --  9.0 8.7* 8.9 8.5* 8.3*  MG 2.1  --   --   --   --   --   PHOS  --  5.5* 5.4* 6.2* 5.3* 4.8*   GFR: Estimated Creatinine Clearance: 24.8 mL/min (A) (by C-G formula based on SCr of 3.39 mg/dL (H)). Liver Function Tests: Recent Labs  Lab 04/11/19 0703 04/12/19 0703 04/13/19 0425 04/14/19 0436 04/15/19 0301  ALBUMIN 2.8* 2.9* 2.9* 2.6* 2.6*   No results for input(s): LIPASE, AMYLASE in the last 168 hours. No results for input(s): AMMONIA in the last 168 hours. Coagulation Profile: No results for input(s): INR, PROTIME in the last 168 hours. Cardiac Enzymes: Recent Labs  Lab 04/09/19 0553 04/12/19 0703 04/13/19 0425  CKTOTAL 1,720* 997* 687*   BNP (last 3 results) No results for input(s): PROBNP in the last 8760 hours. HbA1C: No results for input(s): HGBA1C in the last 72 hours. CBG: Recent Labs  Lab 04/14/19 1637 04/14/19 2057 04/14/19 2343 04/15/19 0030 04/15/19 1000  GLUCAP 378* 270* 245* 233* 184*   Lipid Profile: No results for input(s): CHOL, HDL, LDLCALC, TRIG, CHOLHDL, LDLDIRECT in the last 72 hours. Thyroid Function Tests: No results for input(s): TSH, T4TOTAL, FREET4, T3FREE, THYROIDAB in the last 72 hours. Anemia Panel: No results for input(s): VITAMINB12, FOLATE, FERRITIN, TIBC, IRON, RETICCTPCT in the last 72 hours. Urine analysis:    Component Value Date/Time   COLORURINE YELLOW 04/05/2019 1242   APPEARANCEUR HAZY (A) 04/05/2019 1242   LABSPEC 1.014 04/05/2019 1242   PHURINE 5.0 04/05/2019 1242   GLUCOSEU 50 (A) 04/05/2019 1242    HGBUR LARGE (A) 04/05/2019 1242   BILIRUBINUR NEGATIVE 04/05/2019 1242   KETONESUR NEGATIVE 04/05/2019 1242   PROTEINUR 100 (A) 04/05/2019 1242   UROBILINOGEN 0.2 09/08/2007 1029   NITRITE NEGATIVE 04/05/2019 1242   LEUKOCYTESUR NEGATIVE 04/05/2019 1242   Sepsis Labs: @LABRCNTIP (procalcitonin:4,lacticidven:4)  ) Recent Results (from the past 240 hour(s))  Culture, blood (single)     Status: None   Collection Time: 04/05/19  5:51 PM   Specimen: BLOOD RIGHT FOREARM  Result Value Ref Range Status   Specimen Description   Final    BLOOD RIGHT FOREARM Performed at [facility] [address], [place], [place] [address]    Special Requests   Final    BOTTLES DRAWN AEROBIC AND ANAEROBIC Blood Culture adequate volume Performed at [facility] [address], [place], [place] [address]    Culture   Final    NO GROWTH 5 DAYS Performed at [facility], [place] [address] [place], [place] [address]    Report Status 04/10/2019  FINAL  Final  SARS CORONAVIRUS 2 (TAT 6-24 HRS) Nasopharyngeal Nasopharyngeal Swab     Status: None   Collection Time: 04/05/19  9:39 PM   Specimen: Nasopharyngeal Swab  Result Value Ref Range Status   SARS Coronavirus 2 NEGATIVE NEGATIVE Final    Comment: (NOTE) SARS-CoV-2 target nucleic acids are NOT DETECTED. The SARS-CoV-2 RNA is generally detectable in upper and lower respiratory specimens during the acute phase of infection. Negative results do not preclude SARS-CoV-2 infection, do not rule out co-infections with other pathogens, and should not be used as the sole basis for treatment or other patient management decisions. Negative results must be combined with clinical observations, patient history, and epidemiological information. The expected result is Negative. Fact Sheet for Patients: SugarRoll.be Fact Sheet for Healthcare Providers: https://www.woods-mathews.com/  This test is not yet approved or cleared by the [place] FDA and  has been authorized for detection and/or diagnosis of SARS-CoV-2 by FDA under an Emergency Use Authorization (EUA). This EUA will remain  in effect (meaning this test can be used) for the duration of the COVID-19 declaration under Section 56 4(b)(1) of the Act, 21 U.S.C. section 360bbb-3(b)(1), unless the authorization is terminated or revoked sooner. Performed at [facility], [place] [address], [place], [place] [address]   CSF culture with Stat gram stain     Status: None   Collection Time: 04/08/19 10:12 AM   Specimen: CSF; Cerebrospinal Fluid  Result Value Ref Range Status   Specimen Description CSF  Final   Special Requests Normal  Final   Gram Stain NO WBC SEEN NO ORGANISMS SEEN CYTOSPIN SMEAR   Final   Culture   Final    NO GROWTH Performed at [facility], [place] [address], [place], [place] [address]    Report Status 04/11/2019 FINAL  Final  Culture, Urine     Status: Abnormal   Collection Time: 04/12/19  9:37 AM   Specimen: Urine, Catheterized  Result Value Ref Range Status   Specimen Description URINE, CATHETERIZED  Final   Special Requests   Final    NONE Performed at [facility], [place] [address] [place], [place] [address]    Culture >=100,000 COLONIES/mL ESCHERICHIA COLI (A)  Final   Report Status 04/14/2019 FINAL  Final   Organism ID, Bacteria ESCHERICHIA COLI (A)  Final      Susceptibility   Escherichia coli - MIC*    AMPICILLIN <=2 SENSITIVE Sensitive     CEFAZOLIN <=4 SENSITIVE Sensitive     CEFTRIAXONE <=0.25 SENSITIVE Sensitive     CIPROFLOXACIN <=0.25 SENSITIVE Sensitive     GENTAMICIN <=1 SENSITIVE Sensitive     IMIPENEM <=0.25 SENSITIVE Sensitive     NITROFURANTOIN <=16 SENSITIVE Sensitive     TRIMETH/SULFA <=20 SENSITIVE Sensitive     AMPICILLIN/SULBACTAM <=2 SENSITIVE Sensitive     PIP/TAZO <=4 SENSITIVE Sensitive     * >=100,000 COLONIES/mL ESCHERICHIA COLI          Radiology Studies: DG CHEST PORT 1 VIEW  Result Date: 04/14/2019 CLINICAL DATA:  Pneumonia. Acute renal failure. Altered mental status. EXAM: PORTABLE CHEST 1 VIEW COMPARISON:  05/10/2019. FINDINGS: Dual-lumen right central line in stable position. Heart size stable. Low lung volumes. Improved aeration of both lungs from prior exam. No pleural effusion or pneumothorax. IMPRESSION: 1.  Dual-lumen right central line in stable position. 2. Low lung volumes. Improved aeration of both lungs from prior exam. No focal infiltrate. Electronically Signed   By:  [place]   On: 04/14/2019 07:50        Scheduled Meds: . amLODipine  10 mg Oral Daily  . Chlorhexidine Gluconate Cloth  6 each Topical Q0600  . cloNIDine  0.3 mg Transdermal Q Sun-1800  . feeding supplement  1 Container Oral TID BM  . feeding supplement (PRO-STAT SUGAR FREE 64)  30 mL Oral BID  . heparin injection (subcutaneous)  5,000 Units Subcutaneous Q8H  . insulin aspart  0-5 Units Subcutaneous QHS  . insulin aspart  0-6 Units Subcutaneous TID WC  . insulin aspart  2 Units Subcutaneous TID WC  . labetalol  5 mg Intravenous Once  . lidocaine (PF)  5 mL Other Once   Continuous Infusions: . sodium chloride    . sodium chloride    . lacosamide (VIMPAT) IV 75 mg (04/15/19 1131)     LOS: 10 days    Time spent: 66min  Domenic Polite, MD [facility]  04/15/2019, 11:35 AM

## 2019-04-15 NOTE — Care Management (Addendum)
CM contacted by neurologist.  Pt is self pay and will need to discharge home on seizure medications.  CM verified with pharmacy that vimpat is not on formulary - CM provided neurologist list of options for available seizure meds at pharmacy per pharmacist.  CM was able to secure a post discharge follow up appt at [facility] - see AVS  Update: Neurology provided pts daughter CM's phone number.   CM communicated PCP appt with daughter.  Daughter request that [employer] follow up with her due to pts fluctuation with mentation.  CM sent email to [facility] requesting assessment for pt.

## 2019-04-16 DIAGNOSIS — K921 Melena: Secondary | ICD-10-CM

## 2019-04-16 DIAGNOSIS — D62 Acute posthemorrhagic anemia: Secondary | ICD-10-CM

## 2019-04-16 LAB — PREPARE RBC (CROSSMATCH)

## 2019-04-16 LAB — CBC
HCT: 21.3 % — ABNORMAL LOW (ref 39.0–52.0)
Hemoglobin: 7.1 g/dL — ABNORMAL LOW (ref 13.0–17.0)
MCH: 26.5 pg (ref 26.0–34.0)
MCHC: 33.3 g/dL (ref 30.0–36.0)
MCV: 79.5 fL — ABNORMAL LOW (ref 80.0–100.0)
Platelets: 223 10*3/uL (ref 150–400)
RBC: 2.68 MIL/uL — ABNORMAL LOW (ref 4.22–5.81)
RDW: 13.1 % (ref 11.5–15.5)
WBC: 9 10*3/uL (ref 4.0–10.5)
nRBC: 0 % (ref 0.0–0.2)

## 2019-04-16 LAB — GLUCOSE, CAPILLARY
Glucose-Capillary: 186 mg/dL — ABNORMAL HIGH (ref 70–99)
Glucose-Capillary: 183 mg/dL — ABNORMAL HIGH (ref 70–99)
Glucose-Capillary: 139 mg/dL — ABNORMAL HIGH (ref 70–99)
Glucose-Capillary: 192 mg/dL — ABNORMAL HIGH (ref 70–99)

## 2019-04-16 LAB — IRON AND TIBC
Iron: 150 ug/dL (ref 45–182)
Saturation Ratios: 53 % — ABNORMAL HIGH (ref 17.9–39.5)
TIBC: 281 ug/dL (ref 250–450)
UIBC: 131 ug/dL

## 2019-04-16 LAB — BASIC METABOLIC PANEL
Anion gap: 12 (ref 5–15)
BUN: 84 mg/dL — ABNORMAL HIGH (ref 6–20)
CO2: 19 mmol/L — ABNORMAL LOW (ref 22–32)
Calcium: 8 mg/dL — ABNORMAL LOW (ref 8.9–10.3)
Chloride: 106 mmol/L (ref 98–111)
Creatinine, Ser: 2.86 mg/dL — ABNORMAL HIGH (ref 0.61–1.24)
GFR calc Af Amer: 29 mL/min — ABNORMAL LOW (ref >60)
GFR calc non Af Amer: 25 mL/min — ABNORMAL LOW (ref >60)
Glucose, Bld: 206 mg/dL — ABNORMAL HIGH (ref 70–99)
Potassium: 3.4 mmol/L — ABNORMAL LOW (ref 3.5–5.1)
Sodium: 137 mmol/L (ref 135–145)

## 2019-04-16 LAB — RETICULOCYTES
Immature Retic Fract: 3.5 % (ref 2.3–15.9)
RBC.: 2.72 MIL/uL — ABNORMAL LOW (ref 4.22–5.81)
Retic Count, Absolute: 22.8 10*3/uL (ref 19.0–186.0)
Retic Ct Pct: 0.8 % (ref 0.4–3.1)

## 2019-04-16 LAB — VITAMIN B12: Vitamin B-12: 329 pg/mL (ref 180–914)

## 2019-04-16 LAB — FERRITIN: Ferritin: 161 ng/mL (ref 24–336)

## 2019-04-16 LAB — FOLATE: Folate: 3.8 ng/mL — ABNORMAL LOW (ref >5.9)

## 2019-04-16 MED ORDER — SODIUM CHLORIDE 0.9% IV SOLUTION: Freq: Once | INTRAVENOUS | Status: AC

## 2019-04-16 NOTE — Evaluation (Signed)
Clinical/Bedside Swallow Evaluation Patient Details  Name: Clance Bendall MRN: [id number] Date of Birth: [birth year]  Today's Date: 04/16/2019 Time: SLP Start Time (ACUTE ONLY): 0850 SLP Stop Time (ACUTE ONLY): 0914 SLP Time Calculation (min) (ACUTE ONLY): 24 min  Past Medical History:  Past Medical History:  Diagnosis Date  . Cirrhosis ([place])   . Seizures ([place])    Past Surgical History:  Past Surgical History:  Procedure Laterality Date  . IR FLUORO GUIDE CV LINE RIGHT  04/10/2019  . IR US GUIDE VASC ACCESS RIGHT  04/10/2019   HPI:  Mr Ziya Vereb, 48y/m, presented with metabolic encephaolpathy in the setting of acute kidney injury, uremia and rhabdomyolysis. PMH of partial seizures. No known history of swallowing problems.    Assessment / Plan / Recommendation Clinical Impression  Clinical Swallow Evaluation completed. Pt shows no signs or symptoms of dyspahgia. Oral motor and neurological exam was WNL. Trials of ice, water, apple sauce and crackers tolerated with no signs of dysphagia. Pt's voice remained clear, no throat clearing or coughing throughout evalulation. Recommend pt recieve a regular diet with thin liquids. No further skilled speech therapy needed at this time. SLP Visit Diagnosis: Dysphagia, unspecified (R13.10)    Aspiration Risk  No limitations    Diet Recommendation Regular;Thin liquid   Liquid Administration via: Cup;Straw Medication Administration: Whole meds with liquid Supervision: Patient able to self feed Compensations: Slow rate Postural Changes: Seated upright at 90 degrees    Other  Recommendations Oral Care Recommendations: Oral care BID   Follow up Recommendations None                  Prognosis Prognosis for Safe Diet Advancement: Good      Swallow Study   General Date of Onset: 04/05/19 HPI: Mr Finus Yanke, 48y/m, presented with metabolic encephaolpathy in the setting of acute kidney injury, uremia and rhabdomyolysis. PMH of partial  seizures. No known history of swallowing problems.  Type of Study: Bedside Swallow Evaluation Previous Swallow Assessment: none Diet Prior to this Study: Dysphagia 3 (soft);Thin liquids Temperature Spikes Noted: No Respiratory Status: Room air History of Recent Intubation: No Behavior/Cognition: Alert;Cooperative;Pleasant mood Oral Cavity Assessment: Within Functional Limits Oral Care Completed by SLP: No Oral Cavity - Dentition: Adequate natural dentition Vision: Functional for self-feeding Self-Feeding Abilities: Able to feed self Patient Positioning: Upright in bed Baseline Vocal Quality: Normal Volitional Cough: Strong Volitional Swallow: Able to elicit    Oral/Motor/Sensory Function Overall Oral Motor/Sensory Function: Within functional limits   Ice Chips Ice chips: Within functional limits Presentation: Spoon   Thin Liquid Thin Liquid: Within functional limits    Nectar Thick Nectar Thick Liquid: Not tested   Honey Thick Honey Thick Liquid: Not tested   Puree Puree: Within functional limits   Solid     Solid: Within functional limits     Wynelle Bourgeois., MA, CCC-SLP 04/16/2019,9:46 AM

## 2019-04-16 NOTE — Evaluation (Signed)
Occupational Therapy Evaluation Patient Details Name: [name] MRN: [id number] DOB: [birth year] Today's Date: 04/16/2019    History of Present Illness 49 yo admitted with metabolic encephalophathy in setting of acute kidney injury uremia rhabdomyolysis 1/1 x2 seizures MRI abnormal medial temporal lobe/hippocampus, L thalamus  UTI (+)  PMH partial seizure HTN marijuana use   Clinical Impression   PTA patient reports independent and working. Admitted for above and limited by problem list below, including impaired balance, impaired cognition, decreased coordination, generalized weakness.  He currently requires min assist to min guard for ADLs, min guard for transfers and min assist-min guard for in room mobility.  Follows 1 step commands with increased time, presents with difficulty sequencing simple ADLs (ie handwashing), poor attention, STM, and safety awareness. Patient will benefit from continued OT services while admitted and after dc at [facility] level, given 24/7 supervision, in order to optimize independence and safety with ADLs, mobility.     Follow Up Recommendations  Home health OT;Supervision/Assistance - 24 hour    Equipment Recommendations  None recommended by OT    Recommendations for Other Services Speech consult     Precautions / Restrictions Precautions Precautions: None Restrictions Weight Bearing Restrictions: No      Mobility Bed Mobility Overal bed mobility: Needs Assistance Bed Mobility: Supine to Sit     Supine to sit: Supervision     General bed mobility comments: supervision to assure he does not stand without assistance (due to impaired balance)  Transfers Overall transfer level: Needs assistance Equipment used: None Transfers: Sit to/from Stand Sit to Stand: Min guard         General transfer comment: min guard for safety and balance    Balance Overall balance assessment: Needs assistance Sitting-balance support: No upper extremity  supported;Feet supported Sitting balance-Leahy Scale: Good Sitting balance - Comments: don sock at EOB   Standing balance support: No upper extremity supported;During functional activity Standing balance-Leahy Scale: Good                          ADL either performed or assessed with clinical judgement   ADL Overall ADL's : Needs assistance/impaired     Grooming: Minimal assistance;Standing Grooming Details (indicate cue type and reason): for cognition, to sequence steps Upper Body Bathing: Minimal assistance;Sitting   Lower Body Bathing: Minimal assistance;Sit to/from stand   Upper Body Dressing : Minimal assistance;Sitting   Lower Body Dressing: Minimal assistance;Sit to/from stand Lower Body Dressing Details (indicate cue type and reason): able to don socks, min assist for cognition and transfers Toilet Transfer: Min guard;Ambulation Toilet Transfer Details (indicate cue type and reason): for safety, simulated in room         Functional mobility during ADLs: Min guard;Cueing for safety;Cueing for sequencing General ADL Comments: patient limited by cognition, impaired balance, and weakness     Vision Baseline Vision/History: No visual deficits Patient Visual Report: No change from baseline Vision Assessment?: Yes Eye Alignment: Within Functional Limits Alignment/Gaze Preference: Within Defined Limits Tracking/Visual Pursuits: Able to track stimulus in all quads without difficulty;Other (comment)(cueing to attend to task) Depth Perception: Undershoots Additional Comments: patient reports vision WFL, noted undershooting when reaching to water at sink and during finger to nose testing;  cognition vs vision      Perception     Praxis      Pertinent Vitals/Pain Pain Assessment: 0-10 Pain Score: 6  Pain Location: left arm from IV Pain Descriptors / Indicators: Discomfort Pain  Intervention(s): Monitored during session     Hand Dominance Left    Extremity/Trunk Assessment Upper Extremity Assessment Upper Extremity Assessment: Generalized weakness(decreased coordination )   Lower Extremity Assessment Lower Extremity Assessment: Defer to PT evaluation   Cervical / Trunk Assessment Cervical / Trunk Assessment: Normal   Communication Communication Communication: No difficulties   Cognition Arousal/Alertness: Awake/alert Behavior During Therapy: WFL for tasks assessed/performed Overall Cognitive Status: Impaired/Different from baseline Area of Impairment: Orientation;Memory;Attention;Following commands;Safety/judgement;Problem solving;Awareness                 Orientation Level: Time(with choices and cueing able to verbalize month, year) Current Attention Level: Selective Memory: Decreased short-term memory Following Commands: Follows one step commands consistently;Follows one step commands with increased time Safety/Judgement: Decreased awareness of safety Awareness: Emergent Problem Solving: Slow processing;Decreased initiation;Requires verbal cues;Difficulty sequencing General Comments: patient with poor memory, attempted short blessed but unable to recall name/address; asked to recall 2 words and requires mod cueing to recall after 5 minutes; difficulty sequencing simple self care tasks and perseverates requiring cueing to complete tasks.   General Comments  HR elevated to 126 with standing activities     Exercises     Shoulder Instructions      Home Living Family/patient expects to be discharged to:: Private residence Living Arrangements: Other relatives Available Help at Discharge: Family Type of Home: House Home Access: Stairs to enter Technical brewer of Steps: 3 Entrance Stairs-Rails: Right Home Layout: One level     Bathroom Shower/Tub: Occupational psychologist: Handicapped height Bathroom Accessibility: Yes   Home Equipment: Shower seat - built in;Grab bars - tub/shower;Grab bars  - toilet   Additional Comments: reports the bathroom was made handicap accessible by previous owner      Prior Functioning/Environment Level of Independence: Independent        Comments: works in [place] List: Decreased strength;Decreased activity tolerance;Impaired vision/perception;Impaired balance (sitting and/or standing);Decreased coordination;Decreased cognition;Decreased safety awareness;Decreased knowledge of use of DME or AE      OT Treatment/Interventions: Self-care/ADL training;DME and/or AE instruction;Therapeutic activities;Cognitive remediation/compensation;Balance training;Visual/perceptual remediation/compensation;Patient/family education;Therapeutic exercise    OT Goals(Current goals can be found in the care plan section) Acute Rehab OT Goals Patient Stated Goal: pt unable to state, agreeable for increased independence OT Goal Formulation: With patient Time For Goal Achievement: 04/30/19 Potential to Achieve Goals: Good  OT Frequency: Min 2X/week   Barriers to D/C:            Co-evaluation PT/OT/SLP Co-Evaluation/Treatment: Yes Reason for Co-Treatment: Necessary to address cognition/behavior during functional activity;To address functional/ADL transfers PT goals addressed during session: Mobility/safety with mobility;Balance OT goals addressed during session: ADL's and self-care      AM-PAC OT "6 Clicks" Daily Activity     Outcome Measure Help from another person eating meals?: A Little Help from another person taking care of personal grooming?: A Little Help from another person toileting, which includes using toliet, bedpan, or urinal?: A Lot Help from another person bathing (including washing, rinsing, drying)?: A Little Help from another person to put on and taking off regular upper body clothing?: A Little Help from another person to put on and taking off regular lower body clothing?: A Little 6 Click Score: 17   End of Session  Nurse Communication: Mobility status  Activity Tolerance: Patient tolerated treatment well Patient left: in chair;with call bell/phone within reach;with chair alarm set  OT Visit Diagnosis: Muscle weakness (generalized) (M62.81);Unsteadiness  on feet (R26.81);Other symptoms and signs involving cognitive function                Time: 0933-1006 OT Time Calculation (min): 33 min Charges:  OT General Charges $OT Visit: 1 Visit OT Evaluation $OT Eval Moderate Complexity: 1 Mod  Jolaine Artist, OT Acute Rehabilitation Services Pager [phone] Office [phone]   Delight Stare 04/16/2019, 11:59 AM

## 2019-04-16 NOTE — Progress Notes (Signed)
PROGRESS NOTE    Chioke Jorde  [id number] DOB: 05-27-1970 DOA: 04/05/2019 PCP: Patient, No Pcp Per  Brief Narrative: 49 year old male with history of partial seizures, not on antiepileptic therapy, cannabis use was admitted with metabolic encephalopathy in the setting of acute kidney injury, uremia, rhabdomyolysis.  His BUN was as high as 159, creatinine peaked at 11.5 -On 1/1 he had 2 seizure episodes, neurology was consulted, MRI brain was abnormal, he was started on Vimpat and high-dose IV steroids for 5 days. 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 with high dose IV steroids, his LP was benign, MRI was abnormal  -Kidney function slowly improves, has not required dialysis since 1/4, anticipate continued renal recovery -1/6- 2 episodes of melena, 2 g drop in hemoglobin and bump in BUN   Assessment & Plan:   Severe acute kidney injury/metabolic encephalopathy -Admitted with creatinine greater than 10 and BUN of 140, with clinical evidence of uremia, metabolic encephalopathy -Rhabdomyolysis suspected to be the etiology -Nephrology consulted, he had a TDC placed on 1/1 and started hemodialysis on 1/2 -Urinalysis with no cells, bland sediment -Last hemodialysis was on 1/4, kidney function and urine output continues to improve, anticipate continued renal recovery -?  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 -Case management consulted for medication assistance, needs PCP as well -PT OT eval completed, home health recommended  Acute blood loss anemia Melena, suspected mild upper GI bleed -Patient had 2 episodes of large black stools yesterday, last episode was overnight -Subcu heparin discontinued yesterday -BUN has trended up higher, suspect gastritis versus peptic ulcer disease secondary to recent high-dose steroids -IV PPI q12 -Transfuse 1 unit of PRBC -gi consult  Hypertension -Blood pressure stable, continue discontinue clonidine patch, changed to low-dose p.o.  ? UTI -On IV ceftriaxone, asymptomatic -Stopped antibiotics yesterday after 4-day course  Cannabis use  Type 2 diabetes mellitus/new diagnosis -Noted to have hyperglycemia since admission, I checked A1c yesterday and was 7.5, discussed patient about this -Required IV insulin in the setting of worsening hyperglycemia from steroids -Continue NovoLog sliding scale, diabetes coordinator consult -Add oral hypoglycemics at discharge  DVT prophylaxis: SCDs, heparin held in the setting of melena Code Status: Full code Family Communication: No family at bedside Disposition Plan: Home pending continued improvement in kidney function and encephalopathy  Consultants:   Nephrology  Neurology   Procedures: [facility] catheter 1/1 and hemodialysis  Antimicrobials:    Subjective: -Patient feels okay, feels weak but no other complaints -Discussed with staff nurse who reported an event of dark black stool last night, his BM this morning was a dark greenish smear, no dyspnea, no further seizures  Objective: Vitals:   04/15/19 1901 04/15/19 2154 04/16/19 0000 04/16/19 0300  BP: (!) 145/88 132/72 117/66 122/70  Pulse: 100 91 87 99  Resp: 16 13 15 14   Temp: (!) 97.5 F (36.4 C) (!) 97 F (36.1 C)  (!) 97.5 F (36.4 C)  TempSrc: Oral Oral  Oral  SpO2: 100% 98% 99% 99%  Weight:  Height:         Intake/Output Summary (Last 24 hours) at 04/16/2019 1110 Last data filed at 04/15/2019 2200 Gross per 24 hour  Intake 600 ml  Output 2750 ml  Net -2150 ml   Filed Weights   04/13/19 1522 04/14/19 0300 04/15/19 0500  Weight: 64.5 kg 67.7 kg 66.4 kg    Examination:  Gen: Pleasant thinly built male sitting up in bed, AAOx3, no distress HEENT: PERRLA, Neck supple, no JVD Lungs: Clear, left IJ HD catheter noted CVS: S1-S2, regular rhythm Abd: soft, Non tender, non distended, BS present Extremities: No edema  skin: no new rashes Psychiatry:  Mood & affect appropriate.     Data Reviewed:   CBC: Recent Labs  Lab 04/11/19 0703 04/12/19 0703 04/13/19 0425 04/14/19 0436 04/15/19 0301 04/15/19 1432 04/16/19 0426  WBC 15.7* 17.0* 13.4* 10.4 8.6 9.5 9.0  NEUTROABS 13.7* 14.4* 11.2*  --  6.7  --   --   HGB 11.5* 9.3* 9.5* 9.5* 8.9* 8.3* 7.1*  HCT 33.8* 27.6* 29.1* 28.9* 27.7* 25.8* 21.3*  MCV 78.4* 78.6* 80.8 80.3 80.1 81.1 79.5*  PLT 335 278 300 295 289 254 [id number]   Basic Metabolic Panel: Recent Labs  Lab 04/10/19 1038 04/11/19 0703 04/12/19 0703 04/13/19 0425 04/14/19 0436 04/15/19 0301 04/16/19 0426  NA  --  140 143 146* 142 141 137  K  --  4.3 4.1 4.5 3.7 3.3* 3.4*  CL  --  101 106 110 106 104 106  CO2  --  21* 21* 20* 24 22 19*  GLUCOSE  --  212* 151* 183* 228* 162* 206*  BUN  --  119* 75* 87* 50* 71* 84*  CREATININE  --  8.82* 5.72* 5.54* 3.28* 3.39* 2.86*  CALCIUM  --  9.0 8.7* 8.9 8.5* 8.3* 8.0*  MG 2.1  --   --   --   --   --   --   PHOS  --  5.5* 5.4* 6.2* 5.3* 4.8*  --    GFR: Estimated Creatinine Clearance: 29.3 mL/min (A) (by C-G formula based on SCr of 2.86 mg/dL (H)). Liver Function Tests: Recent Labs  Lab 04/11/19 0703 04/12/19 0703 04/13/19 0425 04/14/19 0436 04/15/19 0301  ALBUMIN 2.8* 2.9* 2.9* 2.6* 2.6*   No results for input(s): LIPASE, AMYLASE in the last 168 hours. No results for input(s): AMMONIA in the last 168 hours. Coagulation  Profile: No results for input(s): INR, PROTIME in the last 168 hours. Cardiac Enzymes: Recent Labs  Lab 04/12/19 0703 04/13/19 0425  CKTOTAL 997* 687*   BNP (last 3 results) No results for input(s): PROBNP in the last 8760 hours. HbA1C: Recent Labs    04/15/19 1721  HGBA1C 7.5*   CBG: Recent Labs  Lab 04/15/19 1000 04/15/19 1321 04/15/19 1712 04/15/19 2059 04/16/19 0742  GLUCAP 184* 280* 415* 269* 186*   Lipid Profile: No results for input(s): CHOL, HDL, LDLCALC, TRIG, CHOLHDL, LDLDIRECT in the last 72 hours. Thyroid Function Tests: No results for input(s): TSH, T4TOTAL, FREET4, T3FREE, THYROIDAB in the last 72 hours. Anemia Panel: Recent Labs    04/16/19 0426  VITAMINB12 329  FOLATE 3.8*  FERRITIN 161  TIBC 281  IRON 150  RETICCTPCT 0.8   Urine analysis:    Component Value Date/Time   COLORURINE YELLOW 04/05/2019 1242   APPEARANCEUR HAZY (A) 04/05/2019 1242   LABSPEC 1.014 04/05/2019 1242   PHURINE 5.0 04/05/2019 1242   GLUCOSEU 50 (A) 04/05/2019 1242   HGBUR  LARGE (A) 04/05/2019 1242   BILIRUBINUR NEGATIVE 04/05/2019 1242   KETONESUR NEGATIVE 04/05/2019 1242   PROTEINUR 100 (A) 04/05/2019 1242   UROBILINOGEN 0.2 09/08/2007 1029   NITRITE NEGATIVE 04/05/2019 1242   LEUKOCYTESUR NEGATIVE 04/05/2019 1242   Sepsis Labs: @LABRCNTIP (procalcitonin:4,lacticidven:4)  ) Recent Results (from the past 240 hour(s))  CSF culture with Stat gram stain     Status: None   Collection Time: 04/08/19 10:12 AM   Specimen: CSF; Cerebrospinal Fluid  Result Value Ref Range Status   Specimen Description CSF  Final   Special Requests Normal  Final   Gram Stain NO WBC SEEN NO ORGANISMS SEEN CYTOSPIN SMEAR   Final   Culture   Final    NO GROWTH Performed at [facility], [place] [address] [place], [place] [address]    Report Status 04/11/2019 FINAL  Final  Culture, Urine     Status: Abnormal   Collection Time: 04/12/19  9:37 AM   Specimen: Urine, Catheterized    Result Value Ref Range Status   Specimen Description URINE, CATHETERIZED  Final   Special Requests   Final    NONE Performed at [facility], [place] [address] [place], [place] [address]    Culture >=100,000 COLONIES/mL ESCHERICHIA COLI (A)  Final   Report Status 04/14/2019 FINAL  Final   Organism ID, Bacteria ESCHERICHIA COLI (A)  Final      Susceptibility   Escherichia coli - MIC*    AMPICILLIN <=2 SENSITIVE Sensitive     CEFAZOLIN <=4 SENSITIVE Sensitive     CEFTRIAXONE <=0.25 SENSITIVE Sensitive     CIPROFLOXACIN <=0.25 SENSITIVE Sensitive     GENTAMICIN <=1 SENSITIVE Sensitive     IMIPENEM <=0.25 SENSITIVE Sensitive     NITROFURANTOIN <=16 SENSITIVE Sensitive     TRIMETH/SULFA <=20 SENSITIVE Sensitive     AMPICILLIN/SULBACTAM <=2 SENSITIVE Sensitive     PIP/TAZO <=4 SENSITIVE Sensitive     * >=100,000 COLONIES/mL ESCHERICHIA COLI         Radiology Studies: MR BRAIN WO CONTRAST  Result Date: 04/15/2019 CLINICAL DATA:  Encephalopathy, seizures, suspected autoimmune encephalitis. Additional history provided: Patient admitted with metabolic encephalopathy in the setting of acute kidney injury, uremia, rhabdomyolysis. EXAM: MRI HEAD WITHOUT CONTRAST TECHNIQUE: Multiplanar, multiecho pulse sequences of the brain and surrounding structures were obtained without intravenous contrast. COMPARISON:  Head CT 04/09/2019, brain MRI 04/07/2019 FINDINGS: Brain: Diffusion-weighted and T2/FLAIR hyperintense signal within the medial left temporal lobe and posteromedial left thalamus persists but has become somewhat less conspicuous. New from prior MRI 04/07/2019, there is fairly extensive subcortical greater than cortical T2/FLAIR hyperintense signal abnormality within the bilateral frontoparietal lobes, occipital lobes and posterior temporal lobes. There is also patchy cortical restricted diffusion throughout these territories suspicious for acute infarcts/early subacute. Additionally, there  is new T2/FLAIR hyperintense signal abnormality within the bilateral cerebellum. Patchy restricted diffusion also present within the bilateral cerebellar hemispheres suspicious for acute/early subacute infarcts. A 3 mm nonspecific focus of T2/FLAIR hyperintensity within the left globus pallidus was present on prior MRI, although seen to better advantage on today's study. No midline shift or extra-axial fluid collection. No chronic intracranial blood products. Cerebral volume is normal for age. Vascular: Flow voids maintained within the proximal large arterial vessels. Skull and upper cervical spine: No focal marrow lesion Sinuses/Orbits: Visualized orbits demonstrate no acute abnormality. Mild ethmoid sinus mucosal thickening. Small maxillary sinus mucous retention cysts. No significant mastoid effusion. These results were called by telephone at the  time of interpretation on 04/15/2019 at 1:29 pm to provider [facility] , who verbally acknowledged these results. IMPRESSION: New from prior MRI 04/07/2019, there is fairly extensive subcortical greater than cortical signal abnormality within the bilateral frontoparietal lobes, occipital lobes and posterior temporal lobes. Patchy cortical restricted diffusion in these territories suspicious for acute/early subacute infarcts. There is also new signal abnormality within the bilateral cerebellum as well as patchy restricted diffusion which is suspicious for acute/early subacute infarcts. 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 Diffusion-weighted and T2 hyperintense signal abnormality within the medial left temporal lobe and posteromedial left thalamus persists, but is somewhat less conspicuous. Differential considerations are unchanged and may reflect seizure related changes, HSV encephalitis or autoimmune encephalitis. Continued MRI follow-up is recommended to ensure  resolution of signal abnormality at these sites. Electronically Signed   By: Kellie Simmering DO   On: 04/15/2019 13:33        Scheduled Meds: . sodium chloride   Intravenous Once  . amLODipine  10 mg Oral Daily  . Chlorhexidine Gluconate Cloth  6 each Topical Q0600  . feeding supplement  1 Container Oral TID BM  . feeding supplement (PRO-STAT SUGAR FREE 64)  30 mL Oral BID  . insulin aspart  0-5 Units Subcutaneous QHS  . insulin aspart  0-6 Units Subcutaneous TID WC  . insulin aspart  4 Units Subcutaneous TID WC  . labetalol  5 mg Intravenous Once  . levETIRAcetam  500 mg Oral BID  . lidocaine (PF)  5 mL Other Once  . pantoprazole (PROTONIX) IV  40 mg Intravenous Q12H   Continuous Infusions: . sodium chloride    . sodium chloride       LOS: 11 days    Time spent: 2min  Domenic Polite, MD [facility]  04/16/2019, 11:10 AM

## 2019-04-16 NOTE — Progress Notes (Signed)
[facility] Progress Note    Assessment/ Plan:   1. Severe AKI secondary to rhabdomyolysis--> with evidence of uremia prompting initiation of RRT.  S/p [facility] 04/10/2019.  HD 04/11/2019, 1/4.  UA with no cells on admission- would doubt GN in this situation. Cr continues to improve and UOP is improved.  No need for dialysis now.  Placed order for IV team to d/c HD cath today.  2. Mild hyperkalemia, resolved 3. Metabolic acidosis improved 4. Rhabdomyolysis-- etiology unclear-- atypical story for status epilepticus 5. Seizure disorder-- per neurology.  6. Confusional state, amnesia,  LP 04/08/19 which is largely negative.  Improving. Neurology following and given empiric solumedrol x 5 days for possible autoimmune vs paraneoplastic process.  MRI completed - findings noted, no clear diagnosis per my interpretation. 7. Hyperuricemia, ? Related to rhabdo 8. Hypertension:  Improved with clonidine patch and amlodipine.  Now that taking po can switch to more po meds if needed in coming days but he's been normotensive so no change. 9. Anemia:  Hb stable in 9s.   I will sign off as he has fair renal recovery.  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  I will arrange f/u with myself in clinic in 4 weeks from now.  He will be contacted.   Subjective:    UOP 1.7L yesterday; afebrile.   BPs improved. Now interactive.   Objective:   BP 122/70 (BP Location: Right Arm)   Pulse 99   Temp (!) 97.5 F (36.4 C) (Oral)   Resp 14   Ht 5\' 8"  (1.727 m)   Wt 66.4 kg   SpO2 99%   BMI 22.26 kg/m   Intake/Output Summary (Last 24 hours) at 04/16/2019 1149 Last data filed at 04/15/2019 2200 Gross per 24 hour  Intake 600 ml  Output 2750 ml  Net -2150 ml   Weight change:   Physical Exam:  Gen: appears chronically ill, now awake and alert CVS: RRR Resp: clear bilaterally no c/w/r Abd: soft  Ext: no LE edema Neuro: awake, oriented to person place but not year; much  improved  Imaging: MR BRAIN WO CONTRAST  Result Date: 04/15/2019 CLINICAL DATA:  Encephalopathy, seizures, suspected autoimmune encephalitis. Additional history provided: Patient admitted with metabolic encephalopathy in the setting of acute kidney injury, uremia, rhabdomyolysis. EXAM: MRI HEAD WITHOUT CONTRAST TECHNIQUE: Multiplanar, multiecho pulse sequences of the brain and surrounding structures were obtained without intravenous contrast. COMPARISON:  Head CT 04/09/2019, brain MRI 04/07/2019 FINDINGS: Brain: Diffusion-weighted and T2/FLAIR hyperintense signal within the medial left temporal lobe and posteromedial left thalamus persists but has become somewhat less conspicuous. New from prior MRI 04/07/2019, there is fairly extensive subcortical greater than cortical T2/FLAIR hyperintense signal abnormality within the bilateral frontoparietal lobes, occipital lobes and posterior temporal lobes. There is also patchy cortical restricted diffusion throughout these territories suspicious for acute infarcts/early subacute. Additionally, there is new T2/FLAIR hyperintense signal abnormality within the bilateral cerebellum. Patchy restricted diffusion also present within the bilateral cerebellar hemispheres suspicious for acute/early subacute infarcts. A 3 mm nonspecific focus of T2/FLAIR hyperintensity within the left globus pallidus was present on prior MRI, although seen to better advantage on today's study. No midline shift or extra-axial fluid collection. No chronic intracranial blood products. Cerebral volume is normal for age. Vascular: Flow voids maintained within the proximal large arterial vessels. Skull and upper cervical spine: No focal marrow lesion Sinuses/Orbits: Visualized orbits demonstrate no acute abnormality. Mild ethmoid sinus mucosal thickening. Small maxillary sinus  mucous retention cysts. No significant mastoid effusion. These results were called by telephone at the time of interpretation on  04/15/2019 at 1:29 pm to provider [facility] , who verbally acknowledged these results. IMPRESSION: New from prior MRI 04/07/2019, there is fairly extensive subcortical greater than cortical signal abnormality within the bilateral frontoparietal lobes, occipital lobes and posterior temporal lobes. Patchy cortical restricted diffusion in these territories suspicious for acute/early subacute infarcts. There is also new signal abnormality within the bilateral cerebellum as well as patchy restricted diffusion which is suspicious for acute/early subacute infarcts. 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 Diffusion-weighted and T2 hyperintense signal abnormality within the medial left temporal lobe and posteromedial left thalamus persists, but is somewhat less conspicuous. Differential considerations are unchanged and may reflect seizure related changes, HSV encephalitis or autoimmune encephalitis. Continued MRI follow-up is recommended to ensure resolution of signal abnormality at these sites. Electronically Signed   By: Kellie Simmering DO   On: 04/15/2019 13:33    Labs: BMET Recent Labs  Lab 04/10/19 [id number] 04/11/19 0703 04/12/19 0703 04/13/19 0425 04/14/19 0436 04/15/19 0301 04/16/19 0426  NA 138 140 143 146* 142 141 137  K 4.6 4.3 4.1 4.5 3.7 3.3* 3.4*  CL 99 101 106 110 106 104 106  CO2 22 21* 21* 20* 24 22 19*  GLUCOSE 147* 212* 151* 183* 228* 162* 206*  BUN 127* 119* 75* 87* 50* 71* 84*  CREATININE 10.16* 8.82* 5.72* 5.54* 3.28* 3.39* 2.86*  CALCIUM 8.6* 9.0 8.7* 8.9 8.5* 8.3* 8.0*  PHOS 6.8* 5.5* 5.4* 6.2* 5.3* 4.8*  --    CBC Recent Labs  Lab 04/11/19 0703 04/12/19 0703 04/13/19 0425 04/14/19 0436 04/15/19 0301 04/15/19 1432 04/16/19 0426  WBC 15.7* 17.0* 13.4* 10.4 8.6 9.5 9.0  NEUTROABS 13.7* 14.4* 11.2*  --  6.7  --   --   HGB 11.5* 9.3* 9.5* 9.5* 8.9* 8.3* 7.1*  HCT 33.8*  27.6* 29.1* 28.9* 27.7* 25.8* 21.3*  MCV 78.4* 78.6* 80.8 80.3 80.1 81.1 79.5*  PLT 335 278 300 295 289 254 223    Medications:    . sodium chloride   Intravenous Once  . amLODipine  10 mg Oral Daily  . Chlorhexidine Gluconate Cloth  6 each Topical Q0600  . feeding supplement  1 Container Oral TID BM  . feeding supplement (PRO-STAT SUGAR FREE 64)  30 mL Oral BID  . insulin aspart  0-5 Units Subcutaneous QHS  . insulin aspart  0-6 Units Subcutaneous TID WC  . insulin aspart  4 Units Subcutaneous TID WC  . labetalol  5 mg Intravenous Once  . levETIRAcetam  500 mg Oral BID  . lidocaine (PF)  5 mL Other Once  . pantoprazole (PROTONIX) IV  40 mg Intravenous Q12H

## 2019-04-16 NOTE — Progress Notes (Signed)
Subjective: No acute events overnight. Denies any headache.  States his memory is still a little fuzzy but he feels much better.   ROS: negative except above  Examination  Vital signs in last 24 hours: Temp:  [97 F (36.1 C)-98 F (36.7 C)] 97.5 F (36.4 C) (01/07 0300) Pulse Rate:  [87-118] 99 (01/07 0300) Resp:  [12-19] 14 (01/07 0300) BP: (117-171)/(66-94) 122/70 (01/07 0300) SpO2:  [98 %-100 %] 99 % (01/07 0300)  General: lying in bed, not in apparent distress CVS: pulse-normal rate and rhythm RS: breathing comfortably Extremities: normal   Neuro: MS: Alert, orientedX3, follows commands, no evidence of aphasia CN: pupils equal and reactive,  EOMI, face symmetric, tongue midline, normal sensation over face, Motor: 5/5 strength in all 4 extremities Reflexes: 2+ bilaterally over patella, biceps, plantars: flexor Coordination: normal Gait: not tested  Basic Metabolic Panel: Recent Labs  Lab 04/10/19 1038 04/11/19 0703 04/12/19 0703 04/13/19 0425 04/14/19 0436 04/15/19 0301 04/16/19 0426  NA  --  140 143 146* 142 141 137  K  --  4.3 4.1 4.5 3.7 3.3* 3.4*  CL  --  101 106 110 106 104 106  CO2  --  21* 21* 20* 24 22 19*  GLUCOSE  --  212* 151* 183* 228* 162* 206*  BUN  --  119* 75* 87* 50* 71* 84*  CREATININE  --  8.82* 5.72* 5.54* 3.28* 3.39* 2.86*  CALCIUM  --  9.0 8.7* 8.9 8.5* 8.3* 8.0*  MG 2.1  --   --   --   --   --   --   PHOS  --  5.5* 5.4* 6.2* 5.3* 4.8*  --     CBC: Recent Labs  Lab 04/11/19 0703 04/12/19 0703 04/13/19 0425 04/14/19 0436 04/15/19 0301 04/15/19 1432 04/16/19 0426  WBC 15.7* 17.0* 13.4* 10.4 8.6 9.5 9.0  NEUTROABS 13.7* 14.4* 11.2*  --  6.7  --   --   HGB 11.5* 9.3* 9.5* 9.5* 8.9* 8.3* 7.1*  HCT 33.8* 27.6* 29.1* 28.9* 27.7* 25.8* 21.3*  MCV 78.4* 78.6* 80.8 80.3 80.1 81.1 79.5*  PLT 335 278 300 295 289 254 223     Coagulation Studies: No results for input(s): LABPROT, INR in the last 72 hours.  Imaging MRI brain without  contrast 04/15/2019: New from prior MRI 04/07/2019, there is fairly extensive subcortical greater than cortical signal abnormality within the bilateral frontoparietal lobes, occipital lobes and posterior temporal lobes. Patchy cortical restricted diffusion in these territories suspicious for acute/early subacute infarcts. There is also new signal abnormality within the bilateral cerebellum as well as patchy restricted diffusion which is suspicious for acute/early subacute infarcts. 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  Diffusion-weighted and T2 hyperintense signal abnormality within the medial left temporal lobe and posteromedial left thalamus persists, but is somewhat less conspicuous. Differential considerations are unchanged and may reflect seizure related changes, HSV encephalitis or autoimmune encephalitis. Continued MRI follow-up is recommended to ensure resolution of signal abnormality at these sites.  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 LP was performed,CSF showed 3 white cells, protein 53, glucose 62, HSV PCR negative, VZV PCR negative, ACE enzyme 16.Anti Jo Antibody, ds DNA, SSA, SSB, SCL 70, Anti SM antibody  all negative. As infectious [facility]. 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  While waiting for patient to undergo dialysis, he went into status epilepticus on Friday 04/10/2019 which has since resolvedafter Ativan and loading dose of Vimpat. 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 On 04/11/2019, patient had his first round of hemodialysis.  He had another round of hemodialysis on 04/13/2019. Gradually, patient started improving.  His encephalopathy has since resolved and he is almost back to baseline (still has "fuzzy" memory).  Of note, on admission patient's blood pressure was 156/104 and was as high as 219/98 on 04/13/2019.  Repeat MRI on 04/15/2019 was concerning for PRES or interval progression of encephalitis.  However patient is clinically improved.  Therefore I suspect the worsening happened between initial MRI on 04/07/2019 and 04/10/2019.   Acute encephalopathy(improving) Uremia Transaminitis Focal motor convulsive status epilepticus( resolved) Seizure -Patient continues to improve.  Recommendations -Continue Keppra 500 mg twice daily.  Patient will likely need to continue this for at least 6 months at which point depending on MRI findings and possibly after repeat EEG we can decide if Keppra can gradually be weaned off -Plan to repeat MRI brain with and without contrast (as long as his renal function has improved ) in about 4 to 6 weeks.   -Strict blood pressure control with goal SBP less than 140 - HD per Nephro, HTN management per hospitalist -Continue seizure precautions -As needed IV Ativan 2 mg for generalized tonic-clonic seizure lasting more than 2 minutes or focal seizure lasting more than 5 minutes -Follow-up with neurology and 6 to 8 weeks after discharge.  Thank you for allowing [place] to participate in the care of this patient. Neurology will sign off. Please page neuro hospitalist for any further questions after 5 PM.   I have spent a total of26minuteswith the patient reviewing hospitalnotes, test results, labs and examining the patient as well as establishing an assessment and plan that was discussed personally with the patient's team and sister on phone.>50% of time was spent in direct patient care.

## 2019-04-16 NOTE — Progress Notes (Signed)
Asked to remove Right HD cath.  Right line is a tunneled line and can not be removed by a Therapist, sports.  Must be removed by MD such as in interventional radiology.

## 2019-04-16 NOTE — Consult Note (Addendum)
[facility] Consult: 11:27 AM 04/16/2019  LOS: 11 days    Referring Provider: Dr Domenic Polite  Primary Care Physician:  Patient, No Pcp Per Primary Gastroenterologist:  Althia Forts.       Reason for Consultation: Melenic stool, anemia.   HPI: [name] is a 49 y.o. male.  PMH Seizure disorder. Smokes mja.   Fatty liver, gallstones, sub-centimeter/TSTC lesion in R hepatic lobe per 11/2017 ultrasound, CT. patient gives history of "cirrhosis" as well.  No previous colonoscopy or endoscopy.  Admission 71/85/50 with metabolic encephalopathy, AKI, uremia, rhabdomyolysis, seizures Management has included high-dose IV steroids for autoimmune encephalitis.  Temporary hemodialysis which ended 1/4 and nephrology anticipates continued renal recovery. Yesterday morning and again overnight patient passed dark, melenic type stool.  Was receiving DVT prophylaxis SQ Heparin but this discontinued, last injection 0700 on 1/6.   Hgb 8.9 >> 7.1 in last 24 hours, was 15.7 at admission and steadily drifted down.   MCV, platelets, INR normal.   Low folate.  B12, iron, ferritin ok.   AKI had been improving but now BUN up 50 to 80, creatinine dropped 3.2 to 2.8 Ammonia, T bili, Alk phos normal.  AST/ALT 222/156 (12/27) >> 89/75 (12/29) 04/05/19 ultrasound abdomen: fatty liver, unable to exclude superimposed inflammation and fibrosis   Pt has had poor appetite for a few months.  Does not endorse weight loss however.  No previous hx dark or bloody stools.  Normally has daily, brown BMs.  When asked about the diagnosis of cirrhosis and how this was made or who gave him this diagnosis, the patient is not sure.  Social Hx: Says he drinks up to 4 beers at a time 3 or 4 times per month. Smokes marijuana for 5 times a month.  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  Family Hx: " I do not know man"    Past Medical History:  Diagnosis Date  . Cirrhosis ([place])   . Seizures ([place])     Past Surgical History:  Procedure Laterality Date  . IR FLUORO GUIDE CV LINE RIGHT  04/10/2019  . IR US GUIDE VASC ACCESS RIGHT  04/10/2019    Prior to Admission medications   Medication Sig Start Date End Date Taking? Authorizing Provider  diphenhydrAMINE (BENADRYL) 25 MG tablet Take 25 mg by mouth 2 (two) times daily as needed for itching or sleep.   Yes [provider]  fexofenadine (ALLEGRA) 180 MG tablet Take 180 mg by mouth daily as needed for allergies or rhinitis.   Yes [provider]  ondansetron (ZOFRAN ODT) 4 MG disintegrating tablet Take 1 tablet (4 mg total) by mouth every 8 (eight) hours as needed for nausea or vomiting. Patient not taking: Reported on 04/05/2019 11/20/17   Kinnie Feil, PA-C    Scheduled Meds: . sodium chloride   Intravenous Once  . amLODipine  10 mg Oral Daily  . Chlorhexidine Gluconate Cloth  6 each Topical Q0600  . feeding supplement  1 Container Oral TID BM  . feeding supplement (PRO-STAT SUGAR FREE 64)  30 mL Oral BID  . insulin aspart  0-5 Units Subcutaneous QHS  . insulin aspart  0-6 Units Subcutaneous TID WC  . insulin aspart  4 Units Subcutaneous TID WC  . labetalol  5 mg Intravenous Once  . levETIRAcetam  500 mg Oral BID  . lidocaine (PF)  5 mL Other Once  . pantoprazole (PROTONIX) IV  40 mg Intravenous Q12H   Infusions: . sodium chloride    . sodium chloride     PRN Meds: sodium chloride, sodium chloride, acetaminophen **OR** acetaminophen, alteplase, heparin, hydrALAZINE, ipratropium-albuterol, labetalol, lidocaine (PF), lidocaine (PF), lidocaine-prilocaine, lip balm, loperamide, LORazepam, ondansetron **OR** ondansetron (ZOFRAN) IV, pentafluoroprop-tetrafluoroeth   Allergies as of 04/05/2019 - Review Complete 04/05/2019  Allergen Reaction Noted  . Penicillins Other  (See Comments) 12/11/2015  . Sulfur Hives and Rash 04/05/2019    Family History  Problem Relation Age of Onset  . Diabetes Mother   . Diabetes Sister     Social History   Socioeconomic History  . Marital status: Single    Spouse name: Not on file  . Number of children: Not on file  . Years of education: Not on file  . Highest education level: Not on file  Occupational History  . Not on file  Tobacco Use  . Smoking status: Never Smoker  . Smokeless tobacco: Never Used  Substance and Sexual Activity  . Alcohol use: No  . Drug use: Yes    Types: Marijuana  . Sexual activity: Not on file  Other Topics Concern  . Not on file  Social History Narrative  . Not on file   Social Determinants of Health   Financial Resource Strain:   . Difficulty of Paying Living Expenses: Not on file  Food Insecurity:   . Worried About Charity fundraiser in the Last Year: Not on file  . Ran Out of Food in the Last Year: Not on file  Transportation Needs:   . Lack of Transportation (Medical): Not on file  . Lack of Transportation (Non-Medical): Not on file  Physical Activity:   . Days of Exercise per Week: Not on file  . Minutes of Exercise per Session: Not on file  Stress:   . Feeling of Stress : Not on file  Social Connections:   . Frequency of Communication with Friends and Family: Not on file  . Frequency of Social Gatherings with Friends and Family: Not on file  . Attends Religious Services: Not on file  . Active Member of Clubs or Organizations: Not on file  . Attends Archivist Meetings: Not on file  . Marital Status: Not on file  Intimate Partner Violence:   . Fear of Current or Ex-Partner: Not on file  . Emotionally Abused: Not on file  . Physically Abused: Not on file  . Sexually Abused: Not on file    REVIEW OF SYSTEMS: Constitutional: Weakness improved. ENT:  No nose bleeds Pulm: No shortness of breath.  No cough. CV:  No palpitations, no LE edema.  No  angina GU:  No hematuria, no frequency. GI: See HPI. Heme: Denies history of excessive bleeding or bruising. Transfusions: Has never had transfusion with blood products. Neuro:  No headaches, no peripheral tingling or numbness.  Her to the seizures which occurred early on this admission, he had not had a seizure for some time and was not taking any antiepileptic meds. Derm:  No itching, no rash or sores.  Endocrine:  No sweats or chills.  No polyuria or dysuria Immunization: Not queried.  There is no records of any vaccinations within epic. Travel:  None beyond local counties in last few months.    PHYSICAL EXAM: Vital signs in last 24 hours: Vitals:   04/16/19 0000 04/16/19 0300  BP: 117/66 122/70  Pulse: 87 99  Resp: 15 14  Temp:  (!) 97.5 F (36.4 C)  SpO2: 99% 99%   Wt Readings from Last 3 Encounters:  04/15/19 66.4 kg  11/20/17 63.5 kg  12/12/15 67.7 kg    General: Thin, alert, comfortable, looks a bit chronically ill. Head: No facial asymmetry or swelling. Eyes: No scleral icterus.  No conjunctival pallor. Ears: Not hard of hearing Nose: No congestion, no discharge Mouth: Tongue midline.  Oral mucosa pink, moist, clear. Neck: No JVD, no thyromegaly, no masses. Lungs: Clear bilaterally.  No labored breathing, no cough Heart: RRR.  No MRG.  S1, S2 present Abdomen: Soft.  Nondistended.  Nontender.  Active bowel sounds.  No HSM, masses, bruits, hernias.   Rectal: Deferred Musc/Skeltl: No gross joint contractures, deformities, swelling, redness. Extremities: No CCE. Neurologic: Alert.  Oriented x3.  Moves all 4 limbs, strength not tested.  No involuntary movements or tremors. Skin: No rash, no sores, no telangiectasia. Tattoos: None. Nodes: No cervical adenopathy Psych: Cooperative, pleasant, flat affect.  Intake/Output from previous day: 01/06 0701 - 01/07 0700 In: 1138 [P.O.:1080; IV Piggyback:58] Out: 2750 [Urine:2750] Intake/Output this shift: No  intake/output data recorded.  LAB RESULTS: Recent Labs    04/15/19 0301 04/15/19 1432 04/16/19 0426  WBC 8.6 9.5 9.0  HGB 8.9* 8.3* 7.1*  HCT 27.7* 25.8* 21.3*  PLT 289 254 223   BMET Lab Results  Component Value Date   NA 137 04/16/2019   NA 141 04/15/2019   NA 142 04/14/2019   K 3.4 (L) 04/16/2019   K 3.3 (L) 04/15/2019   K 3.7 04/14/2019   CL 106 04/16/2019   CL 104 04/15/2019   CL 106 04/14/2019   CO2 19 (L) 04/16/2019   CO2 22 04/15/2019   CO2 24 04/14/2019   GLUCOSE 206 (H) 04/16/2019   GLUCOSE 162 (H) 04/15/2019   GLUCOSE 228 (H) 04/14/2019   BUN 84 (H) 04/16/2019   BUN 71 (H) 04/15/2019   BUN 50 (H) 04/14/2019   CREATININE 2.86 (H) 04/16/2019   CREATININE 3.39 (H) 04/15/2019   CREATININE 3.28 (H) 04/14/2019   CALCIUM 8.0 (L) 04/16/2019   CALCIUM 8.3 (L) 04/15/2019   CALCIUM 8.5 (L) 04/14/2019   LFT Recent Labs    04/14/19 0436 04/15/19 0301  ALBUMIN 2.6* 2.6*   PT/INR Lab Results  Component Value Date   INR 1.1 04/06/2019   Hepatitis Panel No results for input(s): HEPBSAG, HCVAB, HEPAIGM, HEPBIGM in the last 72 hours. C-Diff No components found for: CDIFF Lipase     Component Value Date/Time   LIPASE 32 11/20/2017 0713    Drugs of Abuse     Component Value Date/Time   LABOPIA NONE DETECTED 04/05/2019 1242   COCAINSCRNUR NONE DETECTED 04/05/2019 1242   COCAINSCRNUR NEG 10/13/2007 2110   LABBENZ NONE DETECTED 04/05/2019 1242   LABBENZ NEG 10/13/2007 2110   AMPHETMU NONE DETECTED 04/05/2019 1242   THCU POSITIVE (A) 04/05/2019 1242   LABBARB NONE DETECTED 04/05/2019 1242     RADIOLOGY STUDIES: MR BRAIN WO CONTRAST  Result Date: 04/15/2019 CLINICAL DATA:  Encephalopathy, seizures, suspected autoimmune encephalitis. Additional history provided: Patient admitted with metabolic encephalopathy in the setting of acute  kidney injury, uremia, rhabdomyolysis. EXAM: MRI HEAD WITHOUT CONTRAST TECHNIQUE: Multiplanar, multiecho pulse sequences of  the brain and surrounding structures were obtained without intravenous contrast. COMPARISON:  Head CT 04/09/2019, brain MRI 04/07/2019 FINDINGS: Brain: Diffusion-weighted and T2/FLAIR hyperintense signal within the medial left temporal lobe and posteromedial left thalamus persists but has become somewhat less conspicuous. New from prior MRI 04/07/2019, there is fairly extensive subcortical greater than cortical T2/FLAIR hyperintense signal abnormality within the bilateral frontoparietal lobes, occipital lobes and posterior temporal lobes. There is also patchy cortical restricted diffusion throughout these territories suspicious for acute infarcts/early subacute. Additionally, there is new T2/FLAIR hyperintense signal abnormality within the bilateral cerebellum. Patchy restricted diffusion also present within the bilateral cerebellar hemispheres suspicious for acute/early subacute infarcts. A 3 mm nonspecific focus of T2/FLAIR hyperintensity within the left globus pallidus was present on prior MRI, although seen to better advantage on today's study. No midline shift or extra-axial fluid collection. No chronic intracranial blood products. Cerebral volume is normal for age. Vascular: Flow voids maintained within the proximal large arterial vessels. Skull and upper cervical spine: No focal marrow lesion Sinuses/Orbits: Visualized orbits demonstrate no acute abnormality. Mild ethmoid sinus mucosal thickening. Small maxillary sinus mucous retention cysts. No significant mastoid effusion. These results were called by telephone at the time of interpretation on 04/15/2019 at 1:29 pm to provider [facility] , who verbally acknowledged these results. IMPRESSION: New from prior MRI 04/07/2019, there is fairly extensive subcortical greater than cortical signal abnormality within the bilateral frontoparietal lobes, occipital lobes and posterior temporal lobes. Patchy cortical restricted diffusion in these territories suspicious  for acute/early subacute infarcts. There is also new signal abnormality within the bilateral cerebellum as well as patchy restricted diffusion which is suspicious for acute/early subacute infarcts. 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 Diffusion-weighted and T2 hyperintense signal abnormality within the medial left temporal lobe and posteromedial left thalamus persists, but is somewhat less conspicuous. Differential considerations are unchanged and may reflect seizure related changes, HSV encephalitis or autoimmune encephalitis. Continued MRI follow-up is recommended to ensure resolution of signal abnormality at these sites. Electronically Signed   By: Kellie Simmering DO   On: 04/15/2019 13:33     IMPRESSION:   *   Melenic stools.   Declining Hgb, normocytic anemia. Suspect UGI bleed.    *     Chart documented cirrhosis per pt.  On imaging in 11/2017 and [place] 05/05/18: fatty liver, cant r/o fibrosis.  Hepatitis acute serologies negative.  LFTs mildly elevated near admission.  Pt himself unaware of who/wher he got dx of cirrhosis.  LFTs acutely elevated at admission but trended down, not surprising in setting of rhabdo.  coags and platelets ok.    *   Improving AKI but BUN rising while creatinine improving.   Required HD for AKI this admission.    *   Rhabdo, met encephalopathy, AKI, seizures, uremia, ?autoimmune encephalitis.        PLAN:     *   EGD tomorrow.  NPO after    Azucena Freed  04/16/2019, 11:27 AM Phone [phone]  gi attending  History, laboratories, x-rays reviewed.  Agree with comprehensive consultation note as outlined above.  Patient with multiple significant medical problems.  Now has hemodynamically stable upper GI bleeding in the face of anticoagulation for DVT prophylaxis.  Anticoagulation held.  Now on IV PPI.  No objective evidence for significant liver disease.  Suspect ulcer.  Plan for upper endoscopy in a.m.The nature of the procedure, as well as the risks, benefits, and alternatives were carefully and thoroughly reviewed by the GI PA with the patient. Ample time for discussion and questions allowed. The patient understood, was satisfied, and agreed to proceed.  Docia Chuck. Geri Seminole., M.D. [facility] Division of Gastroenterology

## 2019-04-16 NOTE — Progress Notes (Signed)
Physical Therapy Evaluation Patient Details Name: [name] MRN: [id number] DOB: 11-Oct-1970 Today's Date: 04/16/2019   History of Present Illness  49 yo admitted with metabolic encephalophathy in setting of acute kidney injury uremia rhabdomyolysis 1/1 x2 seizures MRI abnormal medial temporal lobe/hippocampus, L thalamus  UTI (+)  PMH partial seizure HTN marijuana use  Clinical Impression   Pt admitted with above diagnosis. Patient demonstrating generalized weakness, decreased balance and continued decr cognition (could not recall how to call for nursing assist when he needed to use the bathroom, and therefore went in the bed). Apparently has good family support on discharge, however sister has expressed concern over pt's cognitive abilities and safety. He currently will need 24/7 supervision with potentially occasional min assist due to decr balance.  Pt currently with functional limitations due to the deficits listed below (see PT Problem List). Pt will benefit from skilled PT to increase their independence and safety with mobility to allow discharge to the venue listed below.       Follow Up Recommendations Home health PT;Supervision/Assistance - 24 hour    Equipment Recommendations  None recommended by PT(will continue to assess)    Recommendations for Other Services Speech consult     Precautions / Restrictions Precautions Precautions: None Restrictions Weight Bearing Restrictions: No      Mobility  Bed Mobility Overal bed mobility: Needs Assistance Bed Mobility: Supine to Sit     Supine to sit: Supervision     General bed mobility comments: supervision to assure he does not stand without assistance (due to impaired balance)  Transfers Overall transfer level: Needs assistance Equipment used: None Transfers: Sit to/from Stand Sit to Stand: Min guard         General transfer comment: from bed and multiple times from chair; no imbalance during transfer; guarding for  safety due to decr balance with other tasks  Ambulation/Gait Ambulation/Gait assistance: Min guard Gait Distance (Feet): 10 Feet(stood at sink for ~10 minutes; seated rest; 20 ft to recline) Assistive device: None Gait Pattern/deviations: Step-through pattern;Decreased stride length;Drifts right/left;Narrow base of support     General Gait Details: stood at sink to perform ADLs with HR incr to 123. Seated rest and walked to recliner with one signfifcant LOB with pt catching himself on the foot of the bed using his RUE.   Stairs            Wheelchair Mobility    Modified Rankin (Stroke Patients Only)       Balance Overall balance assessment: Needs assistance Sitting-balance support: No upper extremity supported;Feet supported Sitting balance-Leahy Scale: Good Sitting balance - Comments: don sock at EOB   Standing balance support: No upper extremity supported Standing balance-Leahy Scale: Good                   Standardized Balance Assessment Standardized Balance Assessment : Berg Balance Test Berg Balance Test Sit to Stand: Able to stand  independently using hands Standing Unsupported: Able to stand 2 minutes with supervision Sitting with Back Unsupported but Feet Supported on Floor or Stool: Able to sit safely and securely 2 minutes Stand to Sit: Controls descent by using hands Transfers: Able to transfer safely, definite need of hands Standing Unsupported with Eyes Closed: Able to stand 10 seconds with supervision From Standing, Reach Forward with Outstretched Arm: Can reach forward >5 cm safely (2") From Standing Position, Turn to Look Behind Over each Shoulder: Turn sideways only but maintains balance Standing Unsupported, One Foot in Front: Needs  help to step but can hold 15 seconds         Pertinent Vitals/Pain Pain Assessment: 0-10 Pain Score: 6  Pain Location: left arm from IV Pain Descriptors / Indicators: Discomfort Pain Intervention(s):  Monitored during session    Home Living Family/patient expects to be discharged to:: Private residence Living Arrangements: Other relatives Available Help at Discharge: Family Type of Home: House Home Access: Stairs to enter Entrance Stairs-Rails: Right Entrance Stairs-Number of Steps: 3 Home Layout: One level Home Equipment: Shower seat - built in;Grab bars - tub/shower;Grab bars - toilet Additional Comments: reports the bathroom was made handicap accessible by previous owner    Prior Function Level of Independence: Independent         Comments: works in [place]: Left    Extremity/Trunk Assessment   Upper Extremity Assessment Upper Extremity Assessment: Defer to OT evaluation    Lower Extremity Assessment Lower Extremity Assessment: Generalized weakness    Cervical / Trunk Assessment Cervical / Trunk Assessment: Normal  Communication   Communication: No difficulties  Cognition Arousal/Alertness: Awake/alert Behavior During Therapy: WFL for tasks assessed/performed Overall Cognitive Status: Impaired/Different from baseline Area of Impairment: Orientation;Memory;Attention;Following commands;Safety/judgement;Problem solving                 Orientation Level: Time(with mod clues could get to the right month, year) Current Attention Level: Selective Memory: Decreased short-term memory Following Commands: Follows one step commands consistently Safety/Judgement: Decreased awareness of safety   Problem Solving: Slow processing;Decreased initiation;Requires verbal cues General Comments: pt had been incontinent of B/B and had not told his nurses (present in room on PT arrival); states he had no one to help him and so he went in the bed; did not know how to use call button when asked; washing hands at sink required cues for majority of the steps; seemed to perseverate on pericare with washcloth while standing at sink       General Comments General comments (skin integrity, edema, etc.): Portions of Berg completed with pt scoring as low (<4 on 8 of 9 items assessed). This appears to have been his first time up on his feet since hospitalized 10 days prior and several times reported he feels weak. Ambulation distance limited by elevated HR 126 with standing activities.     Exercises     Assessment/Plan    PT Assessment Patient needs continued PT services  PT Problem List Decreased strength;Decreased activity tolerance;Decreased balance;Decreased mobility;Decreased cognition;Decreased knowledge of use of DME;Decreased safety awareness;Cardiopulmonary status limiting activity       PT Treatment Interventions DME instruction;Gait training;Functional mobility training;Therapeutic activities;Stair training;Therapeutic exercise;Balance training;Neuromuscular re-education;Cognitive remediation;Patient/family education    PT Goals (Current goals can be found in the Care Plan section)  Acute Rehab PT Goals Patient Stated Goal: patient unable to state; agreed to goals for OOB and working on balance/strength PT Goal Formulation: With patient Time For Goal Achievement: 04/30/19 Potential to Achieve Goals: Good    Frequency Min 3X/week   Barriers to discharge Other (comment) has 24/7 care of sisters and mother,however they are concerned about caring for pt with decr cognition    Co-evaluation PT/OT/SLP Co-Evaluation/Treatment: Yes Reason for Co-Treatment: Necessary to address cognition/behavior during functional activity;To address functional/ADL transfers PT goals addressed during session: Mobility/safety with mobility;Balance         AM-PAC PT "6 Clicks" Mobility  Outcome Measure Help needed turning from your back to your side while in a flat  bed without using bedrails?: None Help needed moving from lying on your back to sitting on the side of a flat bed without using bedrails?: A Little Help needed moving  to and from a bed to a chair (including a wheelchair)?: A Little Help needed standing up from a chair using your arms (e.g., wheelchair or bedside chair)?: A Little Help needed to walk in hospital room?: A Little Help needed climbing 3-5 steps with a railing? : Total 6 Click Score: 17    End of Session   Activity Tolerance: Treatment limited secondary to medical complications (Comment)(elevated HR and cognition) Patient left: in chair;with call bell/phone within reach;with chair alarm set Nurse Communication: Mobility status;Other (comment)(on chair alarm; memory poor) PT Visit Diagnosis: Unsteadiness on feet (R26.81);Muscle weakness (generalized) (M62.81)    Time: [id number] PT Time Calculation (min) (ACUTE ONLY): 51 min   Charges:   PT Evaluation $PT Eval Low Complexity: 1 Low PT Treatments $Therapeutic Activity: 8-22 mins         Arby Barrette, PT Pager [phone]   Rexanne Mano 04/16/2019, 10:33 AM

## 2019-04-16 NOTE — H&P (View-Only) (Signed)
[name] Consult: 11:27 AM 04/16/2019  LOS: 11 days    Referring Provider: Dr Domenic Polite  Primary Care Physician:  Patient, No Pcp Per Primary Gastroenterologist:  Althia Forts.       Reason for Consultation: Melenic stool, anemia.   HPI: [name] is a 49 y.o. male.  PMH Seizure disorder. Smokes mja.   Fatty liver, gallstones, sub-centimeter/TSTC lesion in R hepatic lobe per 11/2017 ultrasound, CT. patient gives history of "cirrhosis" as well.  No previous colonoscopy or endoscopy.  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  Temporary hemodialysis which ended 1/4 and nephrology anticipates continued renal recovery. Yesterday morning and again overnight patient passed dark, melenic type stool.  Was receiving DVT prophylaxis SQ Heparin but this discontinued, last injection 0700 on 1/6.   Hgb 8.9 >> 7.1 in last 24 hours, was 15.7 at admission and steadily drifted down.   MCV, platelets, INR normal.   Low folate.  B12, iron, ferritin ok.   AKI had been improving but now BUN up 50 to 80, creatinine dropped 3.2 to 2.8 Ammonia, T bili, Alk phos normal.  AST/ALT 222/156 (12/27) >> 89/75 (12/29) 04/05/19 ultrasound abdomen: fatty liver, unable to exclude superimposed inflammation and fibrosis   Pt has had poor appetite for a few months.  Does not endorse weight loss however.  No previous hx dark or bloody stools.  Normally has daily, brown BMs.  When asked about the diagnosis of cirrhosis and how this was made or who gave him this diagnosis, the patient is not sure.  Social Hx: Says he drinks up to 4 beers at a time 3 or 4 times per month. Smokes marijuana for 5 times a month.  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  Family Hx: " I do not know man"    Past Medical History:  Diagnosis Date  . Cirrhosis ([place])   . Seizures ([place])     Past Surgical History:  Procedure Laterality Date  . IR FLUORO GUIDE CV LINE RIGHT  04/10/2019  . IR US GUIDE VASC ACCESS RIGHT  04/10/2019    Prior to Admission medications   Medication Sig Start Date End Date Taking? Authorizing Provider  diphenhydrAMINE (BENADRYL) 25 MG tablet Take 25 mg by mouth 2 (two) times daily as needed for itching or sleep.   Yes [provider]  fexofenadine (ALLEGRA) 180 MG tablet Take 180 mg by mouth daily as needed for allergies or rhinitis.   Yes [provider]  ondansetron (ZOFRAN ODT) 4 MG disintegrating tablet Take 1 tablet (4 mg total) by mouth every 8 (eight) hours as needed for nausea or vomiting. Patient not taking: Reported on 04/05/2019 11/20/17   Kinnie Feil, PA-C    Scheduled Meds: . sodium chloride   Intravenous Once  . amLODipine  10 mg Oral Daily  . Chlorhexidine Gluconate Cloth  6 each Topical Q0600  . feeding supplement  1 Container Oral TID BM  . feeding supplement (PRO-STAT SUGAR FREE 64)  30 mL Oral BID  . insulin aspart  0-5 Units Subcutaneous QHS  . insulin aspart  0-6 Units Subcutaneous TID WC  . insulin aspart  4 Units Subcutaneous TID WC  . labetalol  5 mg Intravenous Once  . levETIRAcetam  500 mg Oral BID  . lidocaine (PF)  5 mL Other Once  . pantoprazole (PROTONIX) IV  40 mg Intravenous Q12H   Infusions: . sodium chloride    . sodium chloride     PRN Meds: sodium chloride, sodium chloride, acetaminophen **OR** acetaminophen, alteplase, heparin, hydrALAZINE, ipratropium-albuterol, labetalol, lidocaine (PF), lidocaine (PF), lidocaine-prilocaine, lip balm, loperamide, LORazepam, ondansetron **OR** ondansetron (ZOFRAN) IV, pentafluoroprop-tetrafluoroeth   Allergies as of 04/05/2019 - Review Complete 04/05/2019  Allergen Reaction Noted  . Penicillins Other  (See Comments) 12/11/2015  . Sulfur Hives and Rash 04/05/2019    Family History  Problem Relation Age of Onset  . Diabetes Mother   . Diabetes Sister     Social History   Socioeconomic History  . Marital status: Single    Spouse name: Not on file  . Number of children: Not on file  . Years of education: Not on file  . Highest education level: Not on file  Occupational History  . Not on file  Tobacco Use  . Smoking status: Never Smoker  . Smokeless tobacco: Never Used  Substance and Sexual Activity  . Alcohol use: No  . Drug use: Yes    Types: Marijuana  . Sexual activity: Not on file  Other Topics Concern  . Not on file  Social History Narrative  . Not on file   Social Determinants of Health   Financial Resource Strain:   . Difficulty of Paying Living Expenses: Not on file  Food Insecurity:   . Worried About Charity fundraiser in the Last Year: Not on file  . Ran Out of Food in the Last Year: Not on file  Transportation Needs:   . Lack of Transportation (Medical): Not on file  . Lack of Transportation (Non-Medical): Not on file  Physical Activity:   . Days of Exercise per Week: Not on file  . Minutes of Exercise per Session: Not on file  Stress:   . Feeling of Stress : Not on file  Social Connections:   . Frequency of Communication with Friends and Family: Not on file  . Frequency of Social Gatherings with Friends and Family: Not on file  . Attends Religious Services: Not on file  . Active Member of Clubs or Organizations: Not on file  . Attends Archivist Meetings: Not on file  . Marital Status: Not on file  Intimate Partner Violence:   . Fear of Current or Ex-Partner: Not on file  . Emotionally Abused: Not on file  . Physically Abused: Not on file  . Sexually Abused: Not on file    REVIEW OF SYSTEMS: Constitutional: Weakness improved. ENT:  No nose bleeds Pulm: No shortness of breath.  No cough. CV:  No palpitations, no LE edema.  No  angina GU:  No hematuria, no frequency. GI: See HPI. Heme: Denies history of excessive bleeding or bruising. Transfusions: Has never had transfusion with blood products. Neuro:  No headaches, no peripheral tingling or numbness.  Her to the seizures which occurred early on this admission, he had not had a seizure for some time and was not taking any antiepileptic meds. Derm:  No itching, no rash or sores.  Endocrine:  No sweats or chills.  No polyuria or dysuria Immunization: Not queried.  There is no records of any vaccinations within epic. Travel:  None beyond local counties in last few months.    PHYSICAL EXAM: Vital signs in last 24 hours: Vitals:   04/16/19 0000 04/16/19 0300  BP: 117/66 122/70  Pulse: 87 99  Resp: 15 14  Temp:  (!) 97.5 F (36.4 C)  SpO2: 99% 99%   Wt Readings from Last 3 Encounters:  04/15/19 66.4 kg  11/20/17 63.5 kg  12/12/15 67.7 kg    General: Thin, alert, comfortable, looks a bit chronically ill. Head: No facial asymmetry or swelling. Eyes: No scleral icterus.  No conjunctival pallor. Ears: Not hard of hearing Nose: No congestion, no discharge Mouth: Tongue midline.  Oral mucosa pink, moist, clear. Neck: No JVD, no thyromegaly, no masses. Lungs: Clear bilaterally.  No labored breathing, no cough Heart: RRR.  No MRG.  S1, S2 present Abdomen: Soft.  Nondistended.  Nontender.  Active bowel sounds.  No HSM, masses, bruits, hernias.   Rectal: Deferred Musc/Skeltl: No gross joint contractures, deformities, swelling, redness. Extremities: No CCE. Neurologic: Alert.  Oriented x3.  Moves all 4 limbs, strength not tested.  No involuntary movements or tremors. Skin: No rash, no sores, no telangiectasia. Tattoos: None. Nodes: No cervical adenopathy Psych: Cooperative, pleasant, flat affect.  Intake/Output from previous day: 01/06 0701 - 01/07 0700 In: 1138 [P.O.:1080; IV Piggyback:58] Out: 2750 [Urine:2750] Intake/Output this shift: No  intake/output data recorded.  LAB RESULTS: Recent Labs    04/15/19 0301 04/15/19 1432 04/16/19 0426  WBC 8.6 9.5 9.0  HGB 8.9* 8.3* 7.1*  HCT 27.7* 25.8* 21.3*  PLT 289 254 223   BMET Lab Results  Component Value Date   NA 137 04/16/2019   NA 141 04/15/2019   NA 142 04/14/2019   K 3.4 (L) 04/16/2019   K 3.3 (L) 04/15/2019   K 3.7 04/14/2019   CL 106 04/16/2019   CL 104 04/15/2019   CL 106 04/14/2019   CO2 19 (L) 04/16/2019   CO2 22 04/15/2019   CO2 24 04/14/2019   GLUCOSE 206 (H) 04/16/2019   GLUCOSE 162 (H) 04/15/2019   GLUCOSE 228 (H) 04/14/2019   BUN 84 (H) 04/16/2019   BUN 71 (H) 04/15/2019   BUN 50 (H) 04/14/2019   CREATININE 2.86 (H) 04/16/2019   CREATININE 3.39 (H) 04/15/2019   CREATININE 3.28 (H) 04/14/2019   CALCIUM 8.0 (L) 04/16/2019   CALCIUM 8.3 (L) 04/15/2019   CALCIUM 8.5 (L) 04/14/2019   LFT Recent Labs    04/14/19 0436 04/15/19 0301  ALBUMIN 2.6* 2.6*   PT/INR Lab Results  Component Value Date   INR 1.1 04/06/2019   Hepatitis Panel No results for input(s): HEPBSAG, HCVAB, HEPAIGM, HEPBIGM in the last 72 hours. C-Diff No components found for: CDIFF Lipase     Component Value Date/Time   LIPASE 32 11/20/2017 0713    Drugs of Abuse     Component Value Date/Time   LABOPIA NONE DETECTED 04/05/2019 1242   COCAINSCRNUR NONE DETECTED 04/05/2019 1242   COCAINSCRNUR NEG 10/13/2007 2110   LABBENZ NONE DETECTED 04/05/2019 1242   LABBENZ NEG 10/13/2007 2110   AMPHETMU NONE DETECTED 04/05/2019 1242   THCU POSITIVE (A) 04/05/2019 1242   LABBARB NONE DETECTED 04/05/2019 1242     RADIOLOGY STUDIES: MR BRAIN WO CONTRAST  Result Date: 04/15/2019 CLINICAL DATA:  Encephalopathy, seizures, suspected autoimmune encephalitis. Additional history provided: Patient admitted with metabolic encephalopathy in the setting of acute  kidney injury, uremia, rhabdomyolysis. EXAM: MRI HEAD WITHOUT CONTRAST TECHNIQUE: Multiplanar, multiecho pulse sequences of  the brain and surrounding structures were obtained without intravenous contrast. COMPARISON:  Head CT 04/09/2019, brain MRI 04/07/2019 FINDINGS: Brain: Diffusion-weighted and T2/FLAIR hyperintense signal within the medial left temporal lobe and posteromedial left thalamus persists but has become somewhat less conspicuous. New from prior MRI 04/07/2019, there is fairly extensive subcortical greater than cortical T2/FLAIR hyperintense signal abnormality within the bilateral frontoparietal lobes, occipital lobes and posterior temporal lobes. There is also patchy cortical restricted diffusion throughout these territories suspicious for acute infarcts/early subacute. Additionally, there is new T2/FLAIR hyperintense signal abnormality within the bilateral cerebellum. Patchy restricted diffusion also present within the bilateral cerebellar hemispheres suspicious for acute/early subacute infarcts. A 3 mm nonspecific focus of T2/FLAIR hyperintensity within the left globus pallidus was present on prior MRI, although seen to better advantage on today's study. No midline shift or extra-axial fluid collection. No chronic intracranial blood products. Cerebral volume is normal for age. Vascular: Flow voids maintained within the proximal large arterial vessels. Skull and upper cervical spine: No focal marrow lesion Sinuses/Orbits: Visualized orbits demonstrate no acute abnormality. Mild ethmoid sinus mucosal thickening. Small maxillary sinus mucous retention cysts. No significant mastoid effusion. These results were called by telephone at the time of interpretation on 04/15/2019 at 1:29 pm to provider [facility] , who verbally acknowledged these results. IMPRESSION: New from prior MRI 04/07/2019, there is fairly extensive subcortical greater than cortical signal abnormality within the bilateral frontoparietal lobes, occipital lobes and posterior temporal lobes. Patchy cortical restricted diffusion in these territories suspicious  for acute/early subacute infarcts. There is also new signal abnormality within the bilateral cerebellum as well as patchy restricted diffusion which is suspicious for acute/early subacute infarcts. 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 Diffusion-weighted and T2 hyperintense signal abnormality within the medial left temporal lobe and posteromedial left thalamus persists, but is somewhat less conspicuous. Differential considerations are unchanged and may reflect seizure related changes, HSV encephalitis or autoimmune encephalitis. Continued MRI follow-up is recommended to ensure resolution of signal abnormality at these sites. Electronically Signed   By: Kellie Simmering DO   On: 04/15/2019 13:33     IMPRESSION:   *   Melenic stools.   Declining Hgb, normocytic anemia. Suspect UGI bleed.    *     Chart documented cirrhosis per pt.  On imaging in 11/2017 and [place] 05/05/18: fatty liver, cant r/o fibrosis.  Hepatitis acute serologies negative.  LFTs mildly elevated near admission.  Pt himself unaware of who/wher he got dx of cirrhosis.  LFTs acutely elevated at admission but trended down, not surprising in setting of rhabdo.  coags and platelets ok.    *   Improving AKI but BUN rising while creatinine improving.   Required HD for AKI this admission.    *   Rhabdo, met encephalopathy, AKI, seizures, uremia, ?autoimmune encephalitis.        PLAN:     *   EGD tomorrow.  NPO after    Azucena Freed  04/16/2019, 11:27 AM Phone [phone]  gi attending  History, laboratories, x-rays reviewed.  Agree with comprehensive consultation note as outlined above.  Patient with multiple significant medical problems.  Now has hemodynamically stable upper GI bleeding in the face of anticoagulation for DVT prophylaxis.  Anticoagulation held.  Now on IV PPI.  No objective evidence for significant liver disease.  Suspect ulcer.  Plan for upper endoscopy in a.m.The nature of the procedure, as well as the risks, benefits, and alternatives were carefully and thoroughly reviewed by the GI PA with the patient. Ample time for discussion and questions allowed. The patient understood, was satisfied, and agreed to proceed.  Docia Chuck. Geri Seminole., M.D. [facility] [name]

## 2019-04-17 ENCOUNTER — Inpatient Hospital Stay (HOSPITAL_COMMUNITY): Payer: Medicaid Other | Admitting: Anesthesiology

## 2019-04-17 ENCOUNTER — Encounter (HOSPITAL_COMMUNITY): Payer: Self-pay | Admitting: Internal Medicine

## 2019-04-17 ENCOUNTER — Inpatient Hospital Stay (HOSPITAL_COMMUNITY): Payer: Medicaid Other

## 2019-04-17 ENCOUNTER — Encounter (HOSPITAL_COMMUNITY)
Admission: EM | Disposition: A | Discharge status: Home Health | Payer: Self-pay | Source: Home / Self Care | Attending: Family Medicine

## 2019-04-17 DIAGNOSIS — K259 Gastric ulcer, unspecified as acute or chronic, without hemorrhage or perforation: Secondary | ICD-10-CM

## 2019-04-17 DIAGNOSIS — K264 Chronic or unspecified duodenal ulcer with hemorrhage: Secondary | ICD-10-CM

## 2019-04-17 DIAGNOSIS — K269 Duodenal ulcer, unspecified as acute or chronic, without hemorrhage or perforation: Secondary | ICD-10-CM

## 2019-04-17 DIAGNOSIS — K253 Acute gastric ulcer without hemorrhage or perforation: Secondary | ICD-10-CM

## 2019-04-17 HISTORY — PX: ESOPHAGOGASTRODUODENOSCOPY (EGD) WITH PROPOFOL: SHX5813

## 2019-04-17 HISTORY — PX: BIOPSY: SHX5522

## 2019-04-17 HISTORY — PX: SUBMUCOSAL INJECTION: SHX5543

## 2019-04-17 HISTORY — PX: HEMOSTASIS CLIP PLACEMENT: SHX6857

## 2019-04-17 HISTORY — PX: IR REMOVAL TUN CV CATH W/O FL: IMG2289

## 2019-04-17 LAB — CBC
HCT: 24.5 % — ABNORMAL LOW (ref 39.0–52.0)
Hemoglobin: 8.4 g/dL — ABNORMAL LOW (ref 13.0–17.0)
MCH: 27.8 pg (ref 26.0–34.0)
MCHC: 34.3 g/dL (ref 30.0–36.0)
MCV: 81.1 fL (ref 80.0–100.0)
Platelets: 237 10*3/uL (ref 150–400)
RBC: 3.02 MIL/uL — ABNORMAL LOW (ref 4.22–5.81)
RDW: 13.6 % (ref 11.5–15.5)
WBC: 12.8 10*3/uL — ABNORMAL HIGH (ref 4.0–10.5)
nRBC: 0 % (ref 0.0–0.2)

## 2019-04-17 LAB — BPAM RBC
Blood Product Expiration Date: 202101122359
ISSUE DATE / TIME: 202101071335
Unit Type and Rh: 600

## 2019-04-17 LAB — TYPE AND SCREEN
ABO/RH(D): A POS
Antibody Screen: NEGATIVE
Unit division: 0

## 2019-04-17 LAB — GLUCOSE, CAPILLARY
Glucose-Capillary: 147 mg/dL — ABNORMAL HIGH (ref 70–99)
Glucose-Capillary: 152 mg/dL — ABNORMAL HIGH (ref 70–99)
Glucose-Capillary: 345 mg/dL — ABNORMAL HIGH (ref 70–99)
Glucose-Capillary: 65 mg/dL — ABNORMAL LOW (ref 70–99)
Glucose-Capillary: 178 mg/dL — ABNORMAL HIGH (ref 70–99)

## 2019-04-17 LAB — BASIC METABOLIC PANEL
Anion gap: 12 (ref 5–15)
BUN: 79 mg/dL — ABNORMAL HIGH (ref 6–20)
CO2: 20 mmol/L — ABNORMAL LOW (ref 22–32)
Calcium: 7.7 mg/dL — ABNORMAL LOW (ref 8.9–10.3)
Chloride: 106 mmol/L (ref 98–111)
Creatinine, Ser: 2.49 mg/dL — ABNORMAL HIGH (ref 0.61–1.24)
GFR calc Af Amer: 34 mL/min — ABNORMAL LOW (ref >60)
GFR calc non Af Amer: 29 mL/min — ABNORMAL LOW (ref >60)
Glucose, Bld: 129 mg/dL — ABNORMAL HIGH (ref 70–99)
Potassium: 3.4 mmol/L — ABNORMAL LOW (ref 3.5–5.1)
Sodium: 138 mmol/L (ref 135–145)

## 2019-04-17 SURGERY — ESOPHAGOGASTRODUODENOSCOPY (EGD) WITH PROPOFOL
Anesthesia: Monitor Anesthesia Care

## 2019-04-17 MED ORDER — POTASSIUM CHLORIDE CRYS ER 20 MEQ PO TBCR
40.0000 meq | EXTENDED_RELEASE_TABLET | Freq: Once | ORAL | Status: AC
  Administered 2019-04-17: 40 meq via ORAL
  Filled 2019-04-17: qty 2

## 2019-04-17 MED ORDER — PROPOFOL 10 MG/ML IV BOLUS
INTRAVENOUS | Status: DC | PRN
  Administered 2019-04-17 (×2): 30 mg via INTRAVENOUS

## 2019-04-17 MED ORDER — SODIUM CHLORIDE (PF) 0.9 % IJ SOLN
PREFILLED_SYRINGE | INTRAMUSCULAR | Status: DC | PRN
  Administered 2019-04-17: 1.5 mL

## 2019-04-17 MED ORDER — EPINEPHRINE 1 MG/10ML IJ SOSY
PREFILLED_SYRINGE | INTRAMUSCULAR | Status: AC
  Filled 2019-04-17: qty 10

## 2019-04-17 MED ORDER — PROPOFOL 10 MG/ML IV BOLUS
INTRAVENOUS | Status: DC | PRN
  Administered 2019-04-17: 75 ug/kg/min via INTRAVENOUS

## 2019-04-17 MED ORDER — SODIUM CHLORIDE 0.9 % IV SOLN
510.0000 mg | Freq: Once | INTRAVENOUS | Status: AC
  Administered 2019-04-17: 510 mg via INTRAVENOUS
  Filled 2019-04-17: qty 17

## 2019-04-17 SURGICAL SUPPLY — 15 items

## 2019-04-17 NOTE — Progress Notes (Signed)
Physical Therapy Treatment Patient Details Name: Larenzo Cittadino MRN: [id number] DOB: [birth year] Today's Date: 04/17/2019    History of Present Illness 49 yo admitted with metabolic encephalophathy in setting of acute kidney injury uremia rhabdomyolysis 1/1 x2 seizures MRI abnormal medial temporal lobe/hippocampus, L thalamus  UTI (+)  PMH partial seizure HTN marijuana use    PT Comments    Pt was seen for mobility on no AD, but given his mild unsteadiness may need to reconsider this need.  Pt is forgetful initially when PT asked about prior use of an AD, and stated he could not remember whether needed.  Pt will continue on acutely with progression of gait and balance skills, to attempt stairs and reassess whether an AD is going to be needed for home.  Current plan is not to add one but may require one depending on his recovery until tomorrow.   Follow Up Recommendations  Home health PT;Supervision/Assistance - 24 hour     Equipment Recommendations  None recommended by PT    Recommendations for Other Services       Precautions / Restrictions Precautions Precautions: None Restrictions Weight Bearing Restrictions: No  Monitor HR with gait    Mobility  Bed Mobility Overal bed mobility: Needs Assistance Bed Mobility: Supine to Sit;Sit to Supine     Supine to sit: Supervision Sit to supine: Min assist   General bed mobility comments: min assist to help get back to bed  Transfers Overall transfer level: Needs assistance Equipment used: None Transfers: Sit to/from Stand Sit to Stand: Supervision;Min guard         General transfer comment: mild unsteadiness to stand, requires a moment to recapture balance  Ambulation/Gait Ambulation/Gait assistance: Min guard Gait Distance (Feet): 100 Feet Assistive device: None;1 person hand held assist Gait Pattern/deviations: Step-through pattern;Decreased stride length;Narrow base of support Gait velocity: reduced   General Gait  Details: HR was up with standing to 124   Stairs             Wheelchair Mobility    Modified Rankin (Stroke Patients Only)       Balance Overall balance assessment: Needs assistance Sitting-balance support: Feet supported Sitting balance-Leahy Scale: Good     Standing balance support: Single extremity supported;No upper extremity supported Standing balance-Leahy Scale: Good Standing balance comment: good once set but was distracted from possibly anesthesia                            Cognition Arousal/Alertness: Awake/alert Behavior During Therapy: WFL for tasks assessed/performed Overall Cognitive Status: Impaired/Different from baseline Area of Impairment: Problem solving;Awareness;Safety/judgement;Following commands;Memory;Attention;Orientation                 Orientation Level: Situation Current Attention Level: Selective Memory: Decreased short-term memory Following Commands: Follows one step commands inconsistently;Follows one step commands with increased time Safety/Judgement: Decreased awareness of safety;Decreased awareness of deficits Awareness: Emergent Problem Solving: Slow processing;Requires verbal cues        Exercises      General Comments General comments (skin integrity, edema, etc.): HR was 124 with short gait to returned to room      Pertinent Vitals/Pain Pain Assessment: No/denies pain    Home Living                      Prior Function            PT Goals (current goals can now be found  in the care plan section) Acute Rehab PT Goals Patient Stated Goal: to get home Progress towards PT goals: Progressing toward goals    Frequency    Min 3X/week      PT Plan Current plan remains appropriate    Co-evaluation              AM-PAC PT "6 Clicks" Mobility   Outcome Measure  Help needed turning from your back to your side while in a flat bed without using bedrails?: None Help needed moving  from lying on your back to sitting on the side of a flat bed without using bedrails?: A Little Help needed moving to and from a bed to a chair (including a wheelchair)?: A Little Help needed standing up from a chair using your arms (e.g., wheelchair or bedside chair)?: A Little Help needed to walk in hospital room?: A Little Help needed climbing 3-5 steps with a railing? : Total 6 Click Score: 17    End of Session Equipment Utilized During Treatment: Gait belt Activity Tolerance: Treatment limited secondary to medical complications (Comment) Patient left: in bed;with call bell/phone within reach;with bed alarm set Nurse Communication: Mobility status PT Visit Diagnosis: Unsteadiness on feet (R26.81);Muscle weakness (generalized) (M62.81)     Time: [id number] PT Time Calculation (min) (ACUTE ONLY): 28 min  Charges:  $Gait Training: 8-22 mins $Therapeutic Activity: 8-22 mins                  Ramond Dial 04/17/2019, 4:25 PM   Mee Hives, PT MS Acute Rehab Dept. Number: [place] and [place]

## 2019-04-17 NOTE — Progress Notes (Signed)
PT Cancellation Note  Patient Details Name: [name] MRN: [id number] DOB: 1971/02/10   Cancelled Treatment:    Reason Eval/Treat Not Completed: Patient at procedure or test/unavailable. Will follow-up for PT treatment as schedule permits.  Mabeline Caras, PT, DPT Acute Rehabilitation Services  Pager [phone] Office [place] 04/17/2019, 10:16 AM

## 2019-04-17 NOTE — Anesthesia Procedure Notes (Signed)
Procedure Name: MAC Date/Time: 04/17/2019 9:45 AM Performed by: Jenne Campus, CRNA Pre-anesthesia Checklist: Patient identified, Emergency Drugs available, Suction available and Patient being monitored Oxygen Delivery Method: Nasal cannula

## 2019-04-17 NOTE — Anesthesia Preprocedure Evaluation (Signed)
Anesthesia Evaluation  Patient identified by MRN, date of birth, ID band Patient confused    Reviewed: Allergy & Precautions, NPO status , Patient's Chart, lab work & pertinent test results  Airway Mallampati: II  TM Distance: >3 FB Neck ROM: Full    Dental  (+) Teeth Intact, Dental Advisory Given   Pulmonary neg pulmonary ROS,    Pulmonary exam normal breath sounds clear to auscultation       Cardiovascular negative cardio ROS Normal cardiovascular exam Rhythm:Regular Rate:Normal     Neuro/Psych Seizures -,     GI/Hepatic negative GI ROS, (+) Cirrhosis       , Melena    Endo/Other  negative endocrine ROS  Renal/GU ARFRenal disease     Musculoskeletal negative musculoskeletal ROS (+)   Abdominal   Peds  Hematology  (+) Blood dyscrasia, anemia ,   Anesthesia Other Findings Day of surgery medications reviewed with the patient.  Reproductive/Obstetrics                             Anesthesia Physical Anesthesia Plan  ASA: III  Anesthesia Plan: MAC   Post-op Pain Management:    Induction: Intravenous  PONV Risk Score and Plan: 1 and Treatment may vary due to age or medical condition and Propofol infusion  Airway Management Planned: Nasal Cannula and Natural Airway  Additional Equipment:   Intra-op Plan:   Post-operative Plan:   Informed Consent: I have reviewed the patients History and Physical, chart, labs and discussed the procedure including the risks, benefits and alternatives for the proposed anesthesia with the patient or authorized representative who has indicated his/her understanding and acceptance.     Dental advisory given  Plan Discussed with: CRNA and Anesthesiologist  Anesthesia Plan Comments:         Anesthesia Quick Evaluation

## 2019-04-17 NOTE — Progress Notes (Signed)
Consult received : PCP, medication assistance. NCM arranged and scheduled hospital f/u @ [facility], noted on AVS. Pt will probably need Match Letter @ d/c.Pt can have Rx meds filled @ [facility] or our [place]. Refills can be obtained through our [facility] pharmacy with pt having f/u appointment scheduled  with clinic. NCM will continue to monitor and assist with TOC needs. Whitman Hero RN,BSN,CM [phone]

## 2019-04-17 NOTE — Progress Notes (Signed)
Pt has f/u with me at [facility] Scheduled 05/27/19 arrive at [facility] clinic at 10:30am.  Please place in D/C summary. He will receive paperwork in the mail.  Jannifer Hick MD

## 2019-04-17 NOTE — Anesthesia Postprocedure Evaluation (Signed)
Anesthesia Post Note  Patient: [name]  Procedure(s) Performed: ESOPHAGOGASTRODUODENOSCOPY (EGD) WITH PROPOFOL (N/A ) BIOPSY SUBMUCOSAL INJECTION HEMOSTASIS CLIP PLACEMENT     Patient location during evaluation: Endoscopy Anesthesia Type: MAC Level of consciousness: awake and alert Pain management: pain level controlled Vital Signs Assessment: post-procedure vital signs reviewed and stable Respiratory status: spontaneous breathing, nonlabored ventilation and respiratory function stable Cardiovascular status: stable and blood pressure returned to baseline Postop Assessment: no apparent nausea or vomiting Anesthetic complications: no    Last Vitals:  Vitals:   04/17/19 1015 04/17/19 1027  BP: (!) 146/77 134/79  Pulse: 71   Resp: 13 12  Temp:    SpO2: 100% 100%    Last Pain:  Vitals:   04/17/19 1027  TempSrc:   PainSc: 0-No pain                 Catalina Gravel

## 2019-04-17 NOTE — Op Note (Signed)
[facility] Patient Name: [name] Procedure Date : 04/17/2019 MRN: [id number] Attending MD: Docia Chuck. Henrene Pastor , MD Date of Birth: [birth year] CSN: [id number] Age: 49 Admit Type: Inpatient Procedure:                Upper GI endoscopy with control of bleeding; with                            biopsies Indications:              Melena, drop in hemoglobin Providers:                Docia Chuck. Henrene Pastor, MD, Benay Pillow, RN, William Dalton, Technician, Theodora Blow, Technician Referring MD:             [facility] Medicines:                Monitored Anesthesia Care Complications:            No immediate complications. Estimated Blood Loss:     Estimated blood loss: none. Procedure:                Pre-Anesthesia Assessment:                           - Prior to the procedure, a History and Physical                            was performed, and patient medications and                            allergies were reviewed. The patient's tolerance of                            previous anesthesia was also reviewed. The risks                            and benefits of the procedure and the sedation                            options and risks were discussed with the patient.                            All questions were answered, and informed consent                            was obtained. Prior Anticoagulants: The patient has                            taken no previous anticoagulant or antiplatelet                            agents. ASA Grade Assessment: III - A patient with  severe systemic disease. After reviewing the risks                            and benefits, the patient was deemed in                            satisfactory condition to undergo the procedure.                           After obtaining informed consent, the endoscope was                            passed under direct vision. Throughout the         procedure, the patient's blood pressure, pulse, and                            oxygen saturations were monitored continuously. The                            [id number] Olympus gastroscope was                            introduced through the mouth, and advanced to the                            second part of duodenum. The upper GI endoscopy was                            accomplished without difficulty. The patient                            tolerated the procedure well. Scope In: Scope Out: Findings:      The esophagus a large caliber partial distal esophageal ring but was       otherwise normal.      The stomach revealed a 5 mm clean-based ulcer in the prepyloric region.       Biopsies were taken with a cold forceps for Helicobacter pylori testing       using CLOtest.      The cardia and gastric fundus were normal on retroflexion.      Two non-bleeding cratered duodenal ulcers with no stigmata of bleeding       were found in the duodenal bulb. The largest lesion was 1.5 cm in       largest dimension. This was cleaned based. The second ulcer was linear       and measured approximately 2 cm in length and 5 mm in width. There was a       central pigmentation which may have represented high risk stigmata. This       was treated with hemostatic therapy. Area was successfully injected with       1.5 mL of a 1:10,000 solution of epinephrine for hemostasis. For       hemostasis, one hemostatic clip was successfully placed. There was no       bleeding at the end of the procedure. Impression:  1. Partial esophageal ring                           2. Small nonbleeding gastric ulcer                           3. 2 duodenal ulcers. No active bleeding. One was                            stigmata treated with endoscopic hemostatic therapy                           4. Status post CLO biopsy. Recommendation:           1. Continue IV PPI for 24 hours then convert to                             pantoprazole 40 mg p.o. twice daily for minimum of                            8 weeks                           2. Avoid aspirin and NSAIDs. If the patient needs                            aspirin or NSAIDs, then he will need daily PPI                            coadministered indefinitely                           3. Follow-up CLO biopsy and treat if positive                           4. Full liquid diet                           5. Discussed with patient. He was provided a copy                            of his report. GI will follow.                           . Procedure Code(s):        --- Professional ---                           [phone], Esophagogastroduodenoscopy, flexible,                            transoral; with biopsy, single or multiple Diagnosis Code(s):        --- Professional ---                           K26.9, Duodenal ulcer, unspecified as acute or  chronic, without hemorrhage or perforation                           K92.1, Melena (includes Hematochezia) CPT copyright 2019 American Medical Association. All rights reserved. The codes documented in this report are preliminary and upon coder review may  be revised to meet current compliance requirements. Docia Chuck. Henrene Pastor, MD 04/17/2019 10:13:00 AM This report has been signed electronically. Number of Addenda: 0

## 2019-04-17 NOTE — Procedures (Signed)
PROCEDURE SUMMARY:  Successful removal of right IJ tunneled HD catheter. No immediate complications.  EBL = 0 mL. Patient tolerated well.  Pressure dressing (gauze, tegaderm) applied to site.  Please see imaging section of Epic for full dictation.   Claris Pong Wiley Flicker PA-C 04/17/2019 4:19 PM

## 2019-04-17 NOTE — Progress Notes (Signed)
PROGRESS NOTE    [name]  [id number] DOB: November 26, 1970 DOA: 04/05/2019 PCP: Patient, No Pcp Per  Brief Narrative: 49 year old male with history of partial seizures, not on antiepileptic therapy, cannabis use was admitted with metabolic encephalopathy in the setting of acute kidney injury, uremia, rhabdomyolysis.  His BUN was as high as 159, creatinine peaked at 11.5 -On 1/1 he had 2 seizure episodes, neurology was consulted, MRI brain was abnormal, he was started on Vimpat and high-dose IV steroids for 5 days. 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 with high dose IV steroids, his LP was benign, MRI was abnormal  -Kidney function slowly improves, has not required dialysis since 1/4, anticipate continued renal recovery -1/6- 2 episodes of melena, 2 g drop in hemoglobin and bump in BUN -1/7; transfused 1 unit of PRBC -1/8; EGD noted small nonbleeding gastric ulcer and 2 duodenal ulcers, one of them was treated with endoscopic hemostatic therapy   Assessment & Plan:   Severe acute kidney injury/metabolic encephalopathy -Admitted with creatinine greater than 10 and BUN of 140, with clinical evidence of uremia, metabolic encephalopathy -Rhabdomyolysis suspected to be the etiology -Nephrology consulted, he had a TDC placed on 1/1 and started hemodialysis on 1/2 -Urinalysis with no cells, bland sediment -Last hemodialysis was on 1/4, kidney function and urine output continues to improve, anticipate continued renal recovery -Nephrology has signed off, they recommended follow-up and HD catheter removal -Will consult IR to remove dialysis catheter, follow-up with Dr. 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 -Case management consulted for medication assistance, needs PCP as well -PT OT eval completed, home health recommended  Upper GI bleed, acute blood loss anemia -Patient had 2 episodes of melena on 1/6, hemoglobin dropped to 7 -Transfused 1 unit of PRBC -EGD today noted gastric and duodenal ulcers, one of them treated with endoscopic hemostatic therapy, continue IV PPI today, changed to p.o. -Monitor hemoglobin  Hypertension -Blood pressure stable, continue discontinued clonidine patch,  -Continue oral amlodipine  ? UTI -Treated with IV ceftriaxone, asymptomatic -Stopped antibiotics completed 5-day course  Cannabis use  Type 2 diabetes mellitus/new diagnosis -Noted to have hyperglycemia since admission, hba1c was 7.5, discussed patient about this -Required IV insulin in the setting of worsening hyperglycemia from steroids -Continue NovoLog sliding scale, diabetes coordinator consult -Add oral hypoglycemics at discharge  DVT prophylaxis: SCDs, heparin held in the setting of melena Code Status: Full code Family Communication: No family at bedside Disposition Plan: Home in 1-2days if hemoglobin stable, no further bleeding  Consultants:   Nephrology  Neurology   Procedures: [facility] catheter 1/1 and hemodialysis  Antimicrobials:    Subjective: -N.p.o. for endoscopy today, patient denies any further black stools Objective: Vitals:   04/17/19 1006 04/17/19 1015 04/17/19 1027 04/17/19 1127  BP: (!) 148/63 (!) 146/77 134/79 130/84  Pulse:  71  (!) 59  Resp: 11 13 12 10   Temp:    97.7 F (36.5 C)  TempSrc:    Oral  SpO2: 100% 100% 100% 100%  Weight:      Height:  Intake/Output Summary (Last 24 hours) at 04/17/2019 1457 Last data filed at 04/17/2019 1200 Gross per 24 hour  Intake 1092.5 ml  Output 1300 ml  Net -207.5 ml   Filed Weights   04/13/19 1522 04/14/19 0300 04/15/19 0500  Weight: 64.5 kg 67.7 kg 66.4 kg    Examination:  Gen: Pleasant thinly built male sitting up in bed, AAOx3, no distress HEENT: PERRLA, Neck supple, no JVD Lungs: Clear, left IJ HD catheter CVS: RRR,No Gallops,Rubs or new Murmurs Abd: soft, Non tender, non distended, BS present Extremities: No edema Skin: no new rashes Psychiatry:  Mood & affect appropriate.     Data Reviewed:   CBC: Recent Labs  Lab 04/11/19 0703 04/12/19 0703 04/13/19 0425 04/14/19 [id number] 04/15/19 0301 04/15/19 1432 04/16/19 0426 04/17/19 0227  WBC 15.7* 17.0* 13.4* 10.4 8.6 9.5 9.0 12.8*  NEUTROABS 13.7* 14.4* 11.2*  --  6.7  --   --   --   HGB 11.5* 9.3* 9.5* 9.5* 8.9* 8.3* 7.1* 8.4*  HCT 33.8* 27.6* 29.1* 28.9* 27.7* 25.8* 21.3* 24.5*  MCV 78.4* 78.6* 80.8 80.3 80.1 81.1 79.5* 81.1  PLT 335 278 300 295 289 254 [id number]   Basic Metabolic Panel: Recent Labs  Lab 04/11/19 0703 04/12/19 0703 04/13/19 0425 04/14/19 0436 04/15/19 0301 04/16/19 0426 04/17/19 0227  NA 140 143 146* 142 141 137 138  K 4.3 4.1 4.5 3.7 3.3* 3.4* 3.4*  CL 101 106 110 106 104 106 106  CO2 21* 21* 20* 24 22 19* 20*  GLUCOSE 212* 151* 183* 228* 162* 206* 129*  BUN 119* 75* 87* 50* 71* 84* 79*  CREATININE 8.82* 5.72* 5.54* 3.28* 3.39* 2.86* 2.49*  CALCIUM 9.0 8.7* 8.9 8.5* 8.3* 8.0* 7.7*  PHOS 5.5* 5.4* 6.2* 5.3* 4.8*  --   --    GFR: Estimated Creatinine Clearance: 33.7 mL/min (A) (by C-G formula based on SCr of 2.49 mg/dL (H)). Liver Function Tests: Recent Labs  Lab 04/11/19 0703 04/12/19 0703 04/13/19 0425 04/14/19 0436 04/15/19 0301  ALBUMIN 2.8* 2.9* 2.9* 2.6* 2.6*   No results for input(s): LIPASE, AMYLASE in the last 168 hours. No results for input(s): AMMONIA in the last 168  hours. Coagulation Profile: No results for input(s): INR, PROTIME in the last 168 hours. Cardiac Enzymes: Recent Labs  Lab 04/12/19 0703 04/13/19 0425  CKTOTAL 997* 687*   BNP (last 3 results) No results for input(s): PROBNP in the last 8760 hours. HbA1C: Recent Labs    04/15/19 1721  HGBA1C 7.5*   CBG: Recent Labs  Lab 04/16/19 1155 04/16/19 1645 04/16/19 2114 04/17/19 0803 04/17/19 1128  GLUCAP 183* 139* 192* 147* 152*   Lipid Profile: No results for input(s): CHOL, HDL, LDLCALC, TRIG, CHOLHDL, LDLDIRECT in the last 72 hours. Thyroid Function Tests: No results for input(s): TSH, T4TOTAL, FREET4, T3FREE, THYROIDAB in the last 72 hours. Anemia Panel: Recent Labs    04/16/19 0426  VITAMINB12 329  FOLATE 3.8*  FERRITIN 161  TIBC 281  IRON 150  RETICCTPCT 0.8   Urine analysis:    Component Value Date/Time   COLORURINE YELLOW 04/05/2019 1242   APPEARANCEUR HAZY (A) 04/05/2019 1242   LABSPEC 1.014 04/05/2019 1242   PHURINE 5.0 04/05/2019 1242   GLUCOSEU 50 (A) 04/05/2019 1242   HGBUR LARGE (A) 04/05/2019 1242   BILIRUBINUR NEGATIVE 04/05/2019 1242   KETONESUR NEGATIVE 04/05/2019 1242   PROTEINUR 100 (A) 04/05/2019 1242   UROBILINOGEN 0.2 09/08/2007 1029   NITRITE NEGATIVE 04/05/2019 1242  LEUKOCYTESUR NEGATIVE 04/05/2019 1242   Sepsis Labs: @LABRCNTIP (procalcitonin:4,lacticidven:4)  ) Recent Results (from the past 240 hour(s))  CSF culture with Stat gram stain     Status: None   Collection Time: 04/08/19 10:12 AM   Specimen: CSF; Cerebrospinal Fluid  Result Value Ref Range Status   Specimen Description CSF  Final   Special Requests Normal  Final   Gram Stain NO WBC SEEN NO ORGANISMS SEEN CYTOSPIN SMEAR   Final   Culture   Final    NO GROWTH Performed at [facility], [place] [address], [place], [place] [address]    Report Status 04/11/2019 FINAL  Final  Culture, Urine     Status: Abnormal   Collection Time: 04/12/19  9:37 AM   Specimen:  Urine, Catheterized  Result Value Ref Range Status   Specimen Description URINE, CATHETERIZED  Final   Special Requests   Final    NONE Performed at [facility], [place] [address], [place], [place] [address]    Culture >=100,000 COLONIES/mL ESCHERICHIA COLI (A)  Final   Report Status 04/14/2019 FINAL  Final   Organism ID, Bacteria ESCHERICHIA COLI (A)  Final      Susceptibility   Escherichia coli - MIC*    AMPICILLIN <=2 SENSITIVE Sensitive     CEFAZOLIN <=4 SENSITIVE Sensitive     CEFTRIAXONE <=0.25 SENSITIVE Sensitive     CIPROFLOXACIN <=0.25 SENSITIVE Sensitive     GENTAMICIN <=1 SENSITIVE Sensitive     IMIPENEM <=0.25 SENSITIVE Sensitive     NITROFURANTOIN <=16 SENSITIVE Sensitive     TRIMETH/SULFA <=20 SENSITIVE Sensitive     AMPICILLIN/SULBACTAM <=2 SENSITIVE Sensitive     PIP/TAZO <=4 SENSITIVE Sensitive     * >=100,000 COLONIES/mL ESCHERICHIA COLI         Radiology Studies: No results found.      Scheduled Meds: . amLODipine  10 mg Oral Daily  . Chlorhexidine Gluconate Cloth  6 each Topical Q0600  . feeding supplement  1 Container Oral TID BM  . feeding supplement (PRO-STAT SUGAR FREE 64)  30 mL Oral BID  . insulin aspart  0-5 Units Subcutaneous QHS  . insulin aspart  0-6 Units Subcutaneous TID WC  . insulin aspart  4 Units Subcutaneous TID WC  . labetalol  5 mg Intravenous Once  . levETIRAcetam  500 mg Oral BID  . lidocaine (PF)  5 mL Other Once  . pantoprazole (PROTONIX) IV  40 mg Intravenous Q12H   Continuous Infusions: . sodium chloride Stopped (04/17/19 1028)  . sodium chloride Stopped (04/16/19 2204)     LOS: 12 days   Time spent: 65min  Domenic Polite, MD [facility]  04/17/2019, 2:57 PM

## 2019-04-17 NOTE — Transfer of Care (Signed)
Immediate Anesthesia Transfer of Care Note  Patient: [name]  Procedure(s) Performed: ESOPHAGOGASTRODUODENOSCOPY (EGD) WITH PROPOFOL (N/A ) BIOPSY SUBMUCOSAL INJECTION HEMOSTASIS CLIP PLACEMENT  Patient Location: PACU and Endoscopy Unit  Anesthesia Type:MAC  Level of Consciousness: awake and patient cooperative  Airway & Oxygen Therapy: Patient Spontanous Breathing and Patient connected to nasal cannula oxygen  Post-op Assessment: Report given to RN and Post -op Vital signs reviewed and stable  Post vital signs: Reviewed  Last Vitals:  Vitals Value Taken Time  BP    Temp    Pulse    Resp    SpO2      Last Pain:  Vitals:   04/17/19 0834  TempSrc: Oral  PainSc: 0-No pain      Patients Stated Pain Goal: 0 (84/69/62 [id number])  Complications: No apparent anesthesia complications

## 2019-04-17 NOTE — Interval H&P Note (Signed)
History and Physical Interval Note:  04/17/2019 9:38 AM  [name]  has presented today for surgery, with the diagnosis of melena, anemia, ? cirrhosis vs fatty liver.  The various methods of treatment have been discussed with the patient and family. After consideration of risks, benefits and other options for treatment, the patient has consented to  Procedure(s): ESOPHAGOGASTRODUODENOSCOPY (EGD) WITH PROPOFOL (N/A) as a surgical intervention.  The patient's history has been reviewed, patient examined, no change in status, stable for surgery.  I have reviewed the patient's chart and labs.  Questions were answered to the patient's satisfaction.     Scarlette Shorts

## 2019-04-18 DIAGNOSIS — D62 Acute posthemorrhagic anemia: Secondary | ICD-10-CM

## 2019-04-18 LAB — CBC
HCT: 27.3 % — ABNORMAL LOW (ref 39.0–52.0)
Hemoglobin: 9.3 g/dL — ABNORMAL LOW (ref 13.0–17.0)
MCH: 27.5 pg (ref 26.0–34.0)
MCHC: 34.1 g/dL (ref 30.0–36.0)
MCV: 80.8 fL (ref 80.0–100.0)
Platelets: 244 10*3/uL (ref 150–400)
RBC: 3.38 MIL/uL — ABNORMAL LOW (ref 4.22–5.81)
RDW: 13.6 % (ref 11.5–15.5)
WBC: 17.1 10*3/uL — ABNORMAL HIGH (ref 4.0–10.5)
nRBC: 0 % (ref 0.0–0.2)

## 2019-04-18 LAB — BASIC METABOLIC PANEL
Anion gap: 12 (ref 5–15)
BUN: 59 mg/dL — ABNORMAL HIGH (ref 6–20)
CO2: 21 mmol/L — ABNORMAL LOW (ref 22–32)
Calcium: 8.4 mg/dL — ABNORMAL LOW (ref 8.9–10.3)
Chloride: 106 mmol/L (ref 98–111)
Creatinine, Ser: 2.12 mg/dL — ABNORMAL HIGH (ref 0.61–1.24)
GFR calc Af Amer: 41 mL/min — ABNORMAL LOW (ref >60)
GFR calc non Af Amer: 35 mL/min — ABNORMAL LOW (ref >60)
Glucose, Bld: 134 mg/dL — ABNORMAL HIGH (ref 70–99)
Potassium: 3.2 mmol/L — ABNORMAL LOW (ref 3.5–5.1)
Sodium: 139 mmol/L (ref 135–145)

## 2019-04-18 LAB — GLUCOSE, CAPILLARY
Glucose-Capillary: 128 mg/dL — ABNORMAL HIGH (ref 70–99)
Glucose-Capillary: 196 mg/dL — ABNORMAL HIGH (ref 70–99)
Glucose-Capillary: 227 mg/dL — ABNORMAL HIGH (ref 70–99)
Glucose-Capillary: 199 mg/dL — ABNORMAL HIGH (ref 70–99)

## 2019-04-18 LAB — CLOTEST (H. PYLORI), BIOPSY: Helicobacter screen: NEGATIVE

## 2019-04-18 MED ORDER — PANTOPRAZOLE SODIUM 40 MG PO TBEC
40.0000 mg | DELAYED_RELEASE_TABLET | Freq: Two times a day (BID) | ORAL | Status: DC
  Administered 2019-04-18 – 2019-04-19 (×3): 40 mg via ORAL
  Filled 2019-04-18 (×3): qty 1

## 2019-04-18 MED ORDER — POTASSIUM CHLORIDE CRYS ER 20 MEQ PO TBCR
40.0000 meq | EXTENDED_RELEASE_TABLET | Freq: Two times a day (BID) | ORAL | Status: DC
  Administered 2019-04-18 – 2019-04-19 (×3): 40 meq via ORAL
  Filled 2019-04-18 (×3): qty 2

## 2019-04-18 NOTE — Progress Notes (Signed)
PROGRESS NOTE    [name]  [id number] DOB: 1970-05-22 DOA: 04/05/2019 PCP: Patient, No Pcp Per  Brief Narrative: 49 year old male with history of partial seizures, not on antiepileptic therapy, cannabis use was admitted with metabolic encephalopathy in the setting of acute kidney injury, uremia, rhabdomyolysis.  His BUN peaked at 159, creatinine peaked at 11.5 -On 1/1 he had 2 seizure episodes, neurology was consulted, MRI brain was abnormal, he was started on Vimpat and high-dose IV steroids for 5 days. 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 with high dose IV steroids, his LP was benign, MRI was abnormal  -Kidney function slowly improves, has not required dialysis since 1/4, anticipate continued renal recovery -1/6- 2 episodes of melena, 2 g drop in hemoglobin and bump in BUN -1/7; transfused 1 unit of PRBC -1/8; EGD noted small nonbleeding gastric ulcer and 2 duodenal ulcers, one of them was treated with endoscopic hemostatic therapy   Assessment & Plan:   Severe acute kidney injury/metabolic encephalopathy -Admitted with creatinine greater than 10 and BUN of 140, with clinical evidence of uremia, metabolic encephalopathy, suspected to be secondary to rhabdomyolysis -Nephrology consulted, had a temporary dialysis catheter placed on 1/1 and started hemodialysis 1/2, repeat dialysis on 1/4, has had renal recovery ever since, with good improvement in urine output and kidney function, creatinine now down to 2.2 -Nephrology has signed off, they recommended follow-up -HD catheter removed 1/8 -Follow-up with nephrology, appointment with Dr. 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 given cost/affordability etc. -Case management consulted for medication assistance, needs PCP as well -PT OT eval completed, home health recommended  Upper GI bleed, acute blood loss anemia -Patient had 2 episodes of melena on 1/6, hemoglobin dropped to 7 -Transfused 1 unit of PRBC -EGD 1/8 noted gastric and duodenal ulcers, one of them treated with endoscopic hemostatic therapy, change PPI to p.o. -Hemoglobin improving, also given IV iron x1 on 1/8  Hypertension -Blood pressure stable, continue discontinued clonidine patch,  -Continue oral amlodipine  ? UTI -Treated with IV ceftriaxone, asymptomatic -Stopped antibiotics completed 5-day course  Cannabis use  Type 2 diabetes mellitus/new diagnosis -Noted to have hyperglycemia since admission, hba1c was 7.5, discussed patient about this -Required IV insulin in the setting of worsening hyperglycemia from steroids -Continue NovoLog sliding scale, diabetes coordinator consult -Add oral hypoglycemics at discharge  DVT prophylaxis: SCDs, heparin held in the setting of melena Code Status: Full code Family Communication: No family at bedside, called and discussed with both sisters 1/8 Disposition Plan: Home tomorrow if stable  Consultants:   Nephrology  Neurology   Procedures: [facility] catheter 1/1 and hemodialysis  EGD 1/8 with: Gastric and duodenal ulcers  Antimicrobials:    Subjective: -Feels okay, reports her dark stool today, no nausea or vomiting, denies any dyspnea or abdominal pain  Objective: Vitals:   04/18/19 0411 04/18/19 0742 04/18/19 0828 04/18/19 0910  BP: 133/85  132/79   Pulse: 71 72 81 92  Resp: 13 12 11    Temp: 97.8 F (36.6 C)  98.1 F (36.7 C)   TempSrc: Axillary  Oral   SpO2: 100% 100% 100% 100%  Weight: 64.9 kg     Height:        Intake/Output Summary (Last 24 hours) at 04/18/2019 1008 Last data filed at 04/18/2019 0741 Gross per 24 hour  Intake 597.12 ml  Output 1575 ml  Net -977.88 ml   Filed Weights   04/14/19 0300 04/15/19 0500 04/18/19 0411  Weight: 67.7 kg 66.4 kg 64.9 kg    Examination:  Gen: Pleasant thinly built male laying in bed, AAO x3, no distress HEENT: PERRLA, Neck supple, no JVD Lungs: Clear, left IJ HD catheter removed CVS: RRR,No Gallops,Rubs or new Murmurs Abd: soft, Non tender, non distended, BS present Extremities: No edema  skin: no new rashes Psychiatry:  Mood & affect appropriate.     Data Reviewed:   CBC: Recent Labs  Lab 04/12/19 0703 04/13/19 0425 04/15/19 0301 04/15/19 1432 04/16/19 0426 04/17/19 0227 04/18/19 0858  WBC 17.0* 13.4* 8.6 9.5 9.0 12.8* 17.1*  NEUTROABS 14.4* 11.2* 6.7  --   --   --   --   HGB 9.3* 9.5* 8.9* 8.3* 7.1* 8.4* 9.3*  HCT 27.6* 29.1* 27.7* 25.8* 21.3* 24.5* 27.3*  MCV 78.6* 80.8 80.1 81.1 79.5* 81.1 80.8  PLT 278 300 289 254 223 237 [id number]   Basic Metabolic Panel: Recent Labs  Lab 04/12/19 0703 04/13/19 0425 04/14/19 0436 04/15/19 0301 04/16/19 0426 04/17/19 0227 04/18/19 0858  NA 143 146* 142 141 137 138 139  K 4.1 4.5 3.7 3.3* 3.4* 3.4* 3.2*  CL 106 110 106 104 106 106 106  CO2 21* 20* 24 22 19* 20* 21*  GLUCOSE 151* 183* 228* 162* 206* 129* 134*  BUN 75* 87* 50* 71* 84* 79* 59*  CREATININE 5.72* 5.54* 3.28* 3.39* 2.86* 2.49* 2.12*  CALCIUM 8.7* 8.9 8.5* 8.3* 8.0* 7.7* 8.4*  PHOS 5.4* 6.2* 5.3* 4.8*  --   --   --    GFR: Estimated Creatinine Clearance: 38.7 mL/min (A) (by C-G formula based on SCr of 2.12 mg/dL (H)). Liver Function Tests: Recent Labs  Lab 04/12/19 0703 04/13/19 0425 04/14/19 0436 04/15/19 0301  ALBUMIN 2.9* 2.9* 2.6* 2.6*   No results for input(s): LIPASE, AMYLASE in the last 168 hours. No results for input(s): AMMONIA in the last  168 hours. Coagulation Profile: No results for input(s): INR, PROTIME in the last 168 hours. Cardiac Enzymes: Recent Labs  Lab 04/12/19 0703 04/13/19 0425  CKTOTAL 997* 687*   BNP (last 3 results) No results for input(s): PROBNP in the last 8760 hours. HbA1C: Recent Labs    04/15/19 1721  HGBA1C 7.5*   CBG: Recent Labs  Lab 04/17/19 1128 04/17/19 1649 04/17/19 2113 04/17/19 2212 04/18/19 0829  GLUCAP 152* 345* 65* 178* 128*   Lipid Profile: No results for input(s): CHOL, HDL, LDLCALC, TRIG, CHOLHDL, LDLDIRECT in the last 72 hours. Thyroid Function Tests: No results for input(s): TSH, T4TOTAL, FREET4, T3FREE, THYROIDAB in the last 72 hours. Anemia Panel: Recent Labs    04/16/19 0426  VITAMINB12 329  FOLATE 3.8*  FERRITIN 161  TIBC 281  IRON 150  RETICCTPCT 0.8   Urine analysis:    Component Value Date/Time   COLORURINE YELLOW 04/05/2019 1242   APPEARANCEUR HAZY (A) 04/05/2019 1242   LABSPEC 1.014 04/05/2019 1242   PHURINE 5.0 04/05/2019 1242   GLUCOSEU 50 (A) 04/05/2019 1242   HGBUR LARGE (A) 04/05/2019 1242   BILIRUBINUR NEGATIVE 04/05/2019 [place] 04/05/2019 1242   PROTEINUR  100 (A) 04/05/2019 1242   UROBILINOGEN 0.2 09/08/2007 1029   NITRITE NEGATIVE 04/05/2019 1242   LEUKOCYTESUR NEGATIVE 04/05/2019 1242   Sepsis Labs: @LABRCNTIP (procalcitonin:4,lacticidven:4)  ) Recent Results (from the past 240 hour(s))  CSF culture with Stat gram stain     Status: None   Collection Time: 04/08/19 10:12 AM   Specimen: CSF; Cerebrospinal Fluid  Result Value Ref Range Status   Specimen Description CSF  Final   Special Requests Normal  Final   Gram Stain NO WBC SEEN NO ORGANISMS SEEN CYTOSPIN SMEAR   Final   Culture   Final    NO GROWTH Performed at [facility], [place] [address] [place], [place] [address]    Report Status 04/11/2019 FINAL  Final  Culture, Urine     Status: Abnormal   Collection Time: 04/12/19  9:37 AM    Specimen: Urine, Catheterized  Result Value Ref Range Status   Specimen Description URINE, CATHETERIZED  Final   Special Requests   Final    NONE Performed at [facility], [place] [address] [place], [place] [address]    Culture >=100,000 COLONIES/mL ESCHERICHIA COLI (A)  Final   Report Status 04/14/2019 FINAL  Final   Organism ID, Bacteria ESCHERICHIA COLI (A)  Final      Susceptibility   Escherichia coli - MIC*    AMPICILLIN <=2 SENSITIVE Sensitive     CEFAZOLIN <=4 SENSITIVE Sensitive     CEFTRIAXONE <=0.25 SENSITIVE Sensitive     CIPROFLOXACIN <=0.25 SENSITIVE Sensitive     GENTAMICIN <=1 SENSITIVE Sensitive     IMIPENEM <=0.25 SENSITIVE Sensitive     NITROFURANTOIN <=16 SENSITIVE Sensitive     TRIMETH/SULFA <=20 SENSITIVE Sensitive     AMPICILLIN/SULBACTAM <=2 SENSITIVE Sensitive     PIP/TAZO <=4 SENSITIVE Sensitive     * >=100,000 COLONIES/mL ESCHERICHIA COLI         Radiology Studies: IR Removal Tun Cv Cath W/O FL  Result Date: 04/17/2019 INDICATION: Patient with history of AKI s/p right IJ tunneled HD catheter placement in IR 04/10/2019 by Dr. Annamaria Boots. Patient with improved renal function and no longer requiring hemodialysis at this time. Request is made for removal of tunneled hemodialysis catheter. EXAM: REMOVAL OF TUNNELED HEMODIALYSIS CATHETER MEDICATIONS: None COMPLICATIONS: None immediate. PROCEDURE: Informed written consent was obtained from the patient's sister, Jaquon Onate, following an explanation of the procedure, risks, benefits and alternatives to treatment. A time out was performed prior to the initiation of the procedure. Maximal barrier sterile technique was utilized including mask, sterile gloves, hand hygiene, and ChloraPrep. Utilizing gentle traction, the catheter was removed intact. Hemostasis was obtained with manual compression. A dressing was placed. The patient tolerated the procedure well without immediate post procedural complication. IMPRESSION:  Successful removal of tunneled dialysis catheter. Read by: Earley Abide, PA-C Electronically Signed   By: Markus Daft M.D.   On: 04/17/2019 16:23        Scheduled Meds: . amLODipine  10 mg Oral Daily  . Chlorhexidine Gluconate Cloth  6 each Topical Q0600  . feeding supplement  1 Container Oral TID BM  . feeding supplement (PRO-STAT SUGAR FREE 64)  30 mL Oral BID  . insulin aspart  0-5 Units Subcutaneous QHS  . insulin aspart  0-6 Units Subcutaneous TID WC  . levETIRAcetam  500 mg Oral BID  . pantoprazole (PROTONIX) IV  40 mg Intravenous Q12H  . potassium chloride  40 mEq Oral BID   Continuous Infusions:  LOS: 13 days   Time spent: 48min  Domenic Polite, MD [facility]  04/18/2019, 10:08 AM

## 2019-04-18 NOTE — Progress Notes (Signed)
HISTORY OF PRESENT ILLNESS:  [name] is a 49 y.o. male who underwent upper endoscopy yesterday for acute upper GI bleeding.  He was found to have significant duodenal ulcer disease as well as incidental gastric ulcer.  One high risk lesion was treated with endoscopic hemostatic therapy.  Patient had 1 bowel movement today.  He did not notice its consistency.  No abdominal pain.  Fair appetite.  Hemoglobin stable at 9.3 (8.4 yesterday).  As well BUN improved from 79->59.  I inquired about NSAIDs as outpatient.  He denies.  Testing for Helicobacter pylori is pending  REVIEW OF SYSTEMS:  All non-GI ROS noncontributory  Past Medical History:  Diagnosis Date  . Cirrhosis ([place])   . Seizures ([employer])     Past Surgical History:  Procedure Laterality Date  . IR FLUORO GUIDE CV LINE RIGHT  04/10/2019  . IR REMOVAL TUN CV CATH W/O FL  04/17/2019  . IR US GUIDE VASC ACCESS RIGHT  04/10/2019    Social History [name]  reports that he has never smoked. He has never used smokeless tobacco. He reports current drug use. Drug: Marijuana. He reports that he does not drink alcohol.  family history includes Diabetes in his mother and sister.  Allergies  Allergen Reactions  . Penicillins Other (See Comments)    Childhood allergy   . Sulfur Hives and Rash       PHYSICAL EXAMINATION: Vital signs: BP 107/77 (BP Location: Right Arm)   Pulse 84   Temp 98.1 F (36.7 C) (Oral)   Resp 16   Ht 5\' 8"  (1.727 m)   Wt 64.9 kg   SpO2 100%   BMI 21.76 kg/m   Constitutional: Thin, somewhat tired but otherwise generally well-appearing, no acute distress Psychiatric: alert and oriented x3, cooperative Eyes: extraocular movements intact, anicteric, conjunctiva pink Mouth: oral pharynx moist, no lesions Neck: supple no lymphadenopathy Cardiovascular: heart regular rate and rhythm, no murmur Lungs: clear to auscultation bilaterally Abdomen: soft, nontender, nondistended, no obvious ascites, no peritoneal  signs, normal bowel sounds, no organomegaly Rectal: Omitted Extremities: no lower extremity edema bilaterally Skin: no lesions on visible extremities Neuro: No focal deficits.   ASSESSMENT:  1.  Acute upper GI bleeding secondary to duodenal ulcer disease status post endoscopic hemostatic therapy.  No evidence for recurrent bleeding 2.  Acute blood loss anemia.  Stable hemoglobin 3.  Multiple significant acute medical problems culminating in admission.  Being managed by primary service   PLAN:  1.  Continue IV PPI therapy for an additional 24 hours then convert to pantoprazole 40 mg twice daily.  He should be on this for a minimum of 8 weeks.  40 mg daily thereafter, indefinitely. 2.  Follow-up Helicobacter pylori testing and treat if positive (we will follow up). 3.  GI will sign off.  Contact us if you have any questions or problems.  Docia Chuck. Geri Seminole., M.D. [facility] Division of Gastroenterology

## 2019-04-18 NOTE — Psychosocial Assessment (Signed)
[facility] notified MEWS yellow RR 26 asymptomatic. Arthor Captain LPN

## 2019-04-18 NOTE — Progress Notes (Signed)
Physical Therapy Treatment Patient Details Name: [name] MRN: [id number] DOB: November 05, 1970 Today's Date: 04/18/2019    History of Present Illness 49 yo admitted with metabolic encephalophathy in setting of acute kidney injury uremia rhabdomyolysis 1/1 x2 seizures MRI abnormal medial temporal lobe/hippocampus, L thalamus  UTI (+)  PMH partial seizure HTN marijuana use    PT Comments    Pt pleasant with flat affect and continues to be limited by cognition, memory and safety. Pt with improved gait tolerance and balance today with drifting right at times. Pt with HR 92 during gait and SpO2 100% on RA. Pt reports family will provide 24hr supervision for D/C. Pt educated for deficits in processing, balance and safety and need for assist with all mobility. Will continue to follow.    Follow Up Recommendations  Home health PT;Supervision/Assistance - 24 hour     Equipment Recommendations  None recommended by PT    Recommendations for Other Services       Precautions / Restrictions Precautions Precautions: None    Mobility  Bed Mobility Overal bed mobility: Needs Assistance Bed Mobility: Supine to Sit     Supine to sit: Supervision     General bed mobility comments: supervision for safety  Transfers Overall transfer level: Needs assistance   Transfers: Sit to/from Stand Sit to Stand: Supervision         General transfer comment: supervision for safety  Ambulation/Gait Ambulation/Gait assistance: Min guard Gait Distance (Feet): 500 Feet Assistive device: None Gait Pattern/deviations: Step-through pattern;Decreased stride length;Drifts right/left   Gait velocity interpretation: >4.37 ft/sec, indicative of normal walking speed General Gait Details: pt with good stability with gait including vertical and horizontal head turns with cues for direction slight drifting to the right at times with pt unaware and narrowly missing obstacles   Stairs Stairs: Yes Stairs  assistance: Supervision Stair Management: Alternating pattern;Forwards;One rail Right Number of Stairs: 5 General stair comments: supervision for safety without LOB with use of rail   Wheelchair Mobility    Modified Rankin (Stroke Patients Only)       Balance Overall balance assessment: Mild deficits observed, not formally tested                                          Cognition Arousal/Alertness: Awake/alert Behavior During Therapy: Flat affect Overall Cognitive Status: Impaired/Different from baseline Area of Impairment: Problem solving;Awareness;Safety/judgement;Following commands;Memory;Attention;Orientation                 Orientation Level: Situation Current Attention Level: Selective Memory: Decreased short-term memory Following Commands: Follows one step commands consistently Safety/Judgement: Decreased awareness of deficits     General Comments: pt unable to name a single "c" animal, confusing right and left, when asked if he need to sit to toilet he stated yes then stood to urinate. No awareness of room setup with cues for sink and using sheets to dry his hands rather than towels that were present in front of him.      Exercises General Exercises - Lower Extremity Long Arc Quad: AROM;Both;Seated;20 reps Hip ABduction/ADduction: AROM;Both;20 reps;Seated Hip Flexion/Marching: AROM;Both;15 reps;Seated Toe Raises: AROM;Both;Seated;15 reps Heel Raises: AROM;Seated;Both;15 reps    General Comments        Pertinent Vitals/Pain Pain Assessment: No/denies pain    Home Living  Prior Function            PT Goals (current goals can now be found in the care plan section) Progress towards PT goals: Progressing toward goals    Frequency    Min 3X/week      PT Plan Current plan remains appropriate    Co-evaluation              AM-PAC PT "6 Clicks" Mobility   Outcome Measure  Help needed  turning from your back to your side while in a flat bed without using bedrails?: A Little Help needed moving from lying on your back to sitting on the side of a flat bed without using bedrails?: A Little Help needed moving to and from a bed to a chair (including a wheelchair)?: A Little Help needed standing up from a chair using your arms (e.g., wheelchair or bedside chair)?: A Little Help needed to walk in hospital room?: A Little Help needed climbing 3-5 steps with a railing? : A Little 6 Click Score: 18    End of Session Equipment Utilized During Treatment: Gait belt Activity Tolerance: Patient tolerated treatment well Patient left: in chair;with call bell/phone within reach;with chair alarm set Nurse Communication: Mobility status PT Visit Diagnosis: Unsteadiness on feet (R26.81);Muscle weakness (generalized) (M62.81)     Time: [id number] PT Time Calculation (min) (ACUTE ONLY): 19 min  Charges:  $Gait Training: 8-22 mins                     Bayard Males, PT Acute Rehabilitation Services Pager: [phone] Office: [place] 04/18/2019, 9:16 AM

## 2019-04-19 DIAGNOSIS — K253 Acute gastric ulcer without hemorrhage or perforation: Secondary | ICD-10-CM

## 2019-04-19 DIAGNOSIS — R569 Unspecified convulsions: Secondary | ICD-10-CM

## 2019-04-19 LAB — BASIC METABOLIC PANEL
Anion gap: 6 (ref 5–15)
BUN: 51 mg/dL — ABNORMAL HIGH (ref 6–20)
CO2: 21 mmol/L — ABNORMAL LOW (ref 22–32)
Calcium: 8 mg/dL — ABNORMAL LOW (ref 8.9–10.3)
Chloride: 109 mmol/L (ref 98–111)
Creatinine, Ser: 1.97 mg/dL — ABNORMAL HIGH (ref 0.61–1.24)
GFR calc Af Amer: 45 mL/min — ABNORMAL LOW (ref >60)
GFR calc non Af Amer: 39 mL/min — ABNORMAL LOW (ref >60)
Glucose, Bld: 131 mg/dL — ABNORMAL HIGH (ref 70–99)
Potassium: 3.4 mmol/L — ABNORMAL LOW (ref 3.5–5.1)
Sodium: 136 mmol/L (ref 135–145)

## 2019-04-19 LAB — GLUCOSE, CAPILLARY
Glucose-Capillary: 132 mg/dL — ABNORMAL HIGH (ref 70–99)
Glucose-Capillary: 107 mg/dL — ABNORMAL HIGH (ref 70–99)

## 2019-04-19 LAB — CBC
HCT: 25.1 % — ABNORMAL LOW (ref 39.0–52.0)
Hemoglobin: 8.5 g/dL — ABNORMAL LOW (ref 13.0–17.0)
MCH: 28 pg (ref 26.0–34.0)
MCHC: 33.9 g/dL (ref 30.0–36.0)
MCV: 82.6 fL (ref 80.0–100.0)
Platelets: 242 10*3/uL (ref 150–400)
RBC: 3.04 MIL/uL — ABNORMAL LOW (ref 4.22–5.81)
RDW: 13.7 % (ref 11.5–15.5)
WBC: 14.6 10*3/uL — ABNORMAL HIGH (ref 4.0–10.5)
nRBC: 0 % (ref 0.0–0.2)

## 2019-04-19 MED ORDER — PANTOPRAZOLE SODIUM 40 MG PO TBEC: 40.0000 mg | DELAYED_RELEASE_TABLET | Freq: Two times a day (BID) | ORAL | 1 refills | Status: DC

## 2019-04-19 MED ORDER — GLIMEPIRIDE 2 MG PO TABS: 2.0000 mg | ORAL_TABLET | ORAL | 1 refills | Status: DC

## 2019-04-19 MED ORDER — LEVETIRACETAM 500 MG PO TABS: 500.0000 mg | ORAL_TABLET | Freq: Two times a day (BID) | ORAL | 1 refills | Status: DC

## 2019-04-19 MED ORDER — ACETAMINOPHEN 500 MG PO TABS: 500.0000 mg | ORAL_TABLET | Freq: Four times a day (QID) | ORAL | Status: DC | PRN

## 2019-04-19 NOTE — Discharge Summary (Signed)
Physician Discharge Summary  [name] [id number] DOB: Jun 11, 1970 DOA: 04/05/2019  PCP: Patient, No Pcp Per  Admit date: 04/05/2019 Discharge date: 04/19/2019  Time spent: 35 minutes  Recommendations for Outpatient Follow-up:  1. PCP at [facility] in 1 week 2. Nephrology Dr. Johnney Ou in February with labs 3. Neurology in 1 month 4. [facility] in 2 weeks   Discharge Diagnoses:  Principal Problem:   Acute renal failure (ARF) (HCC)   Rhabdomyolysis   Acute metabolic encephalopathy   Seizures   Duodenal ulcer hemorrhage   Acute gastric ulcer without hemorrhage or perforation   Acute blood loss anemia   Seizures ([place]) Hypertension Type 2 diabetes mellitus-new diagnosis   Discharge Condition: Improved, instructed regarding no driving/operating machinery, swimming or underwater activities until cleared by neurologist at follow-up  Diet recommendation: diabetic  Filed Weights   04/15/19 0500 04/18/19 0411 04/19/19 0348  Weight: 66.4 kg 64.9 kg 61.2 kg    History of present illness:  49 year old male with history of partial seizures, not on antiepileptic therapy, cannabis use was admitted with metabolic encephalopathy in the setting of acute kidney injury, uremia, rhabdomyolysis.  His BUN peaked at 159, creatinine peaked at 11.5 -On 1/1 he had 2 seizure episodes, neurology was consulted, MRI brain was abnormal, he was started on Vimpat and high-dose IV steroids for 5 days.   Hospital Course:   Severe acute kidney injury/metabolic encephalopathy -Admitted with creatinine greater than 10 and BUN of 140, with clinical evidence of uremia, metabolic encephalopathy, suspected to be secondary to rhabdomyolysis -Nephrology consulted, had a temporary dialysis catheter placed on 1/1 and started hemodialysis 1/2, repeat dialysis on 1/4, has had renal recovery ever since, with good improvement in urine output and kidney function, creatinine now down to 2.2 -Nephrology  has signed off, they recommended follow-up -HD catheter removed 1/8 -Follow-up with nephrology, appointment with Dr. 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 -Case management consulted for medication assistance, needs PCP as well -PT OT eval completed, home health recommended -FU with NEurology, educated regarding no driving or operating machinery until Cleared by outpatient neurologist  Upper GI bleed, acute blood loss anemia -Patient had 2 episodes of melena on 1/6, hemoglobin dropped to 7 -Transfused 1 unit of PRBC -EGD 1/8 noted gastric and duodenal ulcers, one of them treated with endoscopic hemostatic therapy, change PPI to p.o. -Hemoglobin improving, also given IV iron x1 on 1/8 -FU with GI, continue PPI at discharge  Hypertension -Blood pressure stable, continue discontinued clonidine patch,  -Continue oral amlodipine  ? UTI -Treated with IV ceftriaxone, asymptomatic -Stopped antibiotics completed 5-day course  Cannabis use -counseled  Type 2 diabetes mellitus/new diagnosis -Noted to have hyperglycemia since admission, hba1c was 7.5, discussed patient about this -Required IV insulin in the setting of worsening hyperglycemia from steroids - diabetes coordinator consult -started oral hypoglycemics at discharge, recommended carb modified diet -FU with PCP  Discharge Exam: Vitals:   04/18/19 2011 04/18/19 2336  BP: 119/60 119/60  Pulse: 89 84  Resp: (!) 26 14  Temp: 98.8 F (37.1  C) 98 F (36.7 C)  SpO2: 100% 99%    General: AAOx3 Cardiovascular: S1S2/RRR Respiratory: CTAB  Discharge Instructions   Discharge Instructions    Ambulatory referral to Neurology   Complete by: As directed    An appointment is requested in approximately:1 month   Diet Carb Modified   Complete by: As directed    Discharge instructions   Complete by: As directed    No driving, operating machinery, swimming etc for 6 months, until cleared by Neurologist or PCP   Increase activity slowly   Complete by: As directed      Allergies as of 04/19/2019      Reactions   Penicillins Other (See Comments)   Childhood allergy    Sulfur Hives, Rash      Medication List    STOP taking these medications   diphenhydrAMINE 25 MG tablet Commonly known as: BENADRYL   ondansetron 4 MG disintegrating tablet Commonly known as: Zofran ODT     TAKE these medications   acetaminophen 500 MG tablet Commonly known as: TYLENOL Take 1 tablet (500 mg total) by mouth every 6 (six) hours as needed for mild pain, moderate pain, fever or headache.   fexofenadine 180 MG tablet Commonly known as: ALLEGRA Take 180 mg by mouth daily as needed for allergies or rhinitis.   glimepiride 2 MG tablet Commonly known as: Amaryl Take 1 tablet (2 mg total) by mouth every morning.   levETIRAcetam 500 MG tablet Commonly known as: KEPPRA Take 1 tablet (500 mg total) by mouth 2 (two) times daily.   pantoprazole 40 MG tablet Commonly known as: PROTONIX Take 1 tablet (40 mg total) by mouth 2 (two) times daily.      Allergies  Allergen Reactions  . Penicillins Other (See Comments)    Childhood allergy   . Sulfur Hives and Rash   Follow-up Information    [facility] Go on 04/30/2019.   Why: 2:30 pm, Dr. Karle Plumber Contact information: [place] [phone]       Justin Mend, MD Follow up on 05/27/2019.    Specialty: Internal Medicine Why: at 10:30 ( with Kidney specialist Dr.Kruska) Contact information: [address] [place] [address] [phone]            The results of significant diagnostics from this hospitalization (including imaging, microbiology, ancillary and laboratory) are listed below for reference.    Significant Diagnostic Studies: EEG  Result Date: 04/07/2019 Lora Havens, MD     04/07/2019  1:53 PM Patient Name: Grafton Lehrke MRN: [id number] Epilepsy Attending: Lora Havens Referring Physician/Provider: Dr. Cristal Ford Date: 04/07/2019 Duration: 26.30 minutes Patient history: 49 year old male with prior history of seizure disorder who presented with altered mental status.  EEG to evaluate for seizures. Level of alertness: awake AEDs during EEG study: None Technical aspects: This EEG study was done with scalp electrodes positioned according to the 10-20 International system of electrode placement. Electrical activity was acquired at a sampling rate of 500Hz  and reviewed with a high frequency filter of 70Hz  and a low frequency filter of 1Hz . EEG data were recorded continuously and digitally stored. Description: During awake state, no clear posterior dominant rhythm was seen.  EEG showed continuous rhythmic 2 to 3 Hz delta activity in right temporoparietal region, which at times appeared sharply contoured.  There was also continuous low amplitude 2 to 5 Hz theta - delta slowing as well as intermittent rhythmic 2 to 3 Hz delta  slowing seen in left hemisphere.  Physiologic photic driving was not seen during photic stimulation.  No EEG change was seen during hyperventilation other than continuous right temporoparietal delta slowing. Abnormality -Continuous rhythmic delta slowing, right temporoparietal region -Continuous slow, left hemisphere IMPRESSION: This study is suggestive of cortical dysfunction in the right temporoparietal region, likely secondary to underlying  abnormality.  There is also evidence of cortical dysfunction in left hemisphere, nonspecific to etiology.  No seizures or definite epileptiform discharges were seen throughout the recording. However, only wakefulness was recorded. If suspicion for interictal activity remains a concern, a prolonged study including sleep should be considered.   CT HEAD WO CONTRAST  Result Date: 04/09/2019 CLINICAL DATA:  Cephalopathy EXAM: CT HEAD WITHOUT CONTRAST TECHNIQUE: Contiguous axial images were obtained from the base of the skull through the vertex without intravenous contrast. COMPARISON:  04/05/2019 FINDINGS: Brain: No evidence of acute infarction, hemorrhage, hydrocephalus, extra-axial collection or mass lesion/mass effect. Vascular: No hyperdense vessel or unexpected calcification. Skull: Normal. Negative for fracture or focal lesion. Sinuses/Orbits: No acute finding. Other: None. IMPRESSION: No acute intracranial abnormality is noted. The overall appearance is similar to that seen on the prior exam. Electronically Signed   By: Inez Catalina M.D.   On: 04/09/2019 14:42   CT Head Wo Contrast  Result Date: 04/05/2019 CLINICAL DATA:  Altered mental status. EXAM: CT HEAD WITHOUT CONTRAST TECHNIQUE: Contiguous axial images were obtained from the base of the skull through the vertex without intravenous contrast. COMPARISON:  09/08/2007 FINDINGS: Brain: There is no evidence for acute hemorrhage, hydrocephalus, mass lesion, or abnormal extra-axial fluid collection. No definite CT evidence for acute infarction. Vascular: No hyperdense vessel or unexpected calcification. Skull: No evidence for fracture. No worrisome lytic or sclerotic lesion. Sinuses/Orbits: The visualized paranasal sinuses and mastoid air cells are clear. Visualized portions of the globes and intraorbital fat are unremarkable. Other: None. IMPRESSION: 1. Unremarkable CT head.  No acute intracranial abnormality. Electronically Signed   By: Misty Stanley  M.D.   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 Additional history provided: Patient admitted with metabolic encephalopathy in the setting of acute kidney injury, uremia, rhabdomyolysis. EXAM: MRI HEAD WITHOUT CONTRAST TECHNIQUE: Multiplanar, multiecho pulse sequences of the brain and surrounding structures were obtained without intravenous contrast. COMPARISON:  Head CT 04/09/2019, brain MRI 04/07/2019 FINDINGS: Brain: Diffusion-weighted and T2/FLAIR hyperintense signal within the medial left temporal lobe and posteromedial left thalamus persists but has become somewhat less conspicuous. New from prior MRI 04/07/2019, there is fairly extensive subcortical greater than cortical T2/FLAIR hyperintense signal abnormality within the bilateral frontoparietal lobes, occipital lobes and posterior temporal lobes. There is also patchy cortical restricted diffusion throughout these territories suspicious for acute infarcts/early subacute. Additionally, there is new T2/FLAIR hyperintense signal abnormality within the bilateral cerebellum. Patchy restricted diffusion also present within the bilateral cerebellar hemispheres suspicious for acute/early subacute infarcts. A 3 mm nonspecific focus of T2/FLAIR hyperintensity within the left globus pallidus was present on prior MRI, although seen to better advantage on today's study. No midline shift or extra-axial fluid collection. No chronic intracranial blood products. Cerebral volume is normal for age. Vascular: Flow voids maintained within the proximal large arterial vessels. Skull and upper cervical spine: No focal marrow lesion Sinuses/Orbits: Visualized orbits demonstrate no acute abnormality. Mild ethmoid sinus mucosal thickening. Small maxillary sinus mucous retention cysts. No significant mastoid effusion. These results were called by telephone at the  time of  interpretation on 04/15/2019 at 1:29 pm to provider [facility] , who verbally acknowledged these results. IMPRESSION: New from prior MRI 04/07/2019, there is fairly extensive subcortical greater than cortical signal abnormality within the bilateral frontoparietal lobes, occipital lobes and posterior temporal lobes. Patchy cortical restricted diffusion in these territories suspicious for acute/early subacute infarcts. There is also new signal abnormality within the bilateral cerebellum as well as patchy restricted diffusion which is suspicious for acute/early subacute infarcts. 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 Diffusion-weighted and T2 hyperintense signal abnormality within the medial left temporal lobe and posteromedial left thalamus persists, but is somewhat less conspicuous. Differential considerations are unchanged and may reflect seizure related changes, HSV encephalitis or autoimmune encephalitis. Continued MRI follow-up is recommended to ensure resolution of signal abnormality at these sites. Electronically Signed   By: Kellie Simmering DO   On: 04/15/2019 13:33   MR BRAIN WO CONTRAST  Addendum Date: 04/07/2019   ADDENDUM REPORT: 04/07/2019 13:48 ADDENDUM: These results were called by telephone at the time of interpretation on 04/07/2019 at 1:48 pm to provider [facility] , who verbally acknowledged these results. Electronically Signed   By: Kellie Simmering DO   On: 04/07/2019 13:48   Result Date: 04/07/2019 CLINICAL DATA:  Seizure, chronic, history of trauma. Additional history provided: Altered mental status EXAM: MRI HEAD WITHOUT CONTRAST TECHNIQUE: Multiplanar, multiecho pulse sequences of the brain and surrounding structures were obtained without intravenous contrast. COMPARISON:  Head CT 04/05/2019, brain MRI 04/07/2007 FINDINGS: Brain: Intermittently motion degraded examination. There is  diffusion weighted and T2/FLAIR hyperintensity within the medial temporal lobe/hippocampus as well as posteromedial left thalamus. There is mild associated parenchymal swelling. There is a small amount of SWI signal loss along the right parietal lobe likely reflecting chronic blood products (series 7, image 81). No focal parenchymal signal abnormality elsewhere within the brain. No midline shift or extra-axial fluid collection. Cerebral volume is normal for age. Vascular: Flow voids maintained within the proximal large arterial vessels. Skull and upper cervical spine: No focal marrow lesion Sinuses/Orbits: Visualized orbits demonstrate no acute abnormality. Mild scattered paranasal sinus mucosal thickening. Right maxillary sinus mucous retention cysts. No significant mastoid effusion. IMPRESSION: Diffusion-weighted and T2/FLAIR hyperintense signal abnormality within the medial temporal lobe/hippocampus as well as posteromedial left thalamus. Mild associated parenchymal swelling. 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 MRI follow-up is recommended following seizure control to ensure resolution of signal abnormality and to exclude alternative etiologies (i.e. neoplasm) Chronic blood products along the right parietal lobe, possibly posttraumatic. Electronically Signed: By: Kellie Simmering DO On: 04/07/2019 13:35   US Abdomen Complete  Result Date: 04/05/2019 CLINICAL DATA:  49 year old male with acute renal failure. EXAM: ABDOMEN ULTRASOUND COMPLETE COMPARISON:  CT of the abdomen pelvis dated 11/20/2017. FINDINGS: Gallbladder: No gallstones or wall thickening visualized. No sonographic Murphy sign noted by sonographer. Common bile duct: Diameter: 5 mm Liver: There is diffuse increased liver echogenicity most commonly seen in the setting of fatty infiltration. Superimposed inflammation or fibrosis is not excluded. Clinical correlation is  recommended. A 1 cm hypoechoic focus within the liver is not characterized but may represent an area of fat sparing or correspond to the hypoechoic structure seen on the CT of 11/20/2017. Portal vein is patent on color Doppler imaging with normal direction of blood flow towards the liver. IVC: No abnormality visualized. Pancreas: Visualized portion unremarkable. Spleen:  Size and appearance within normal limits. Right Kidney: Length: 12 cm. Normal echogenicity. No hydronephrosis or shadowing stone. Left Kidney: Length: 12 cm. Normal echogenicity. No hydronephrosis or shadowing stone. Abdominal aorta: No aneurysm visualized. Other findings: None. IMPRESSION: 1. Fatty liver. 2. No gallstone. Electronically Signed   By: Anner Crete M.D.   On: 04/05/2019 20:54   IR Fluoro Guide CV Line Right  Result Date: 04/10/2019 INDICATION: End-stage renal disease, no current access EXAM: ULTRASOUND GUIDANCE FOR VASCULAR ACCESS RIGHT INTERNAL JUGULAR PERMANENT HEMODIALYSIS CATHETER Date:  04/10/2019 04/10/2019 3:00 pm Radiologist:  Jerilynn Mages. Daryll Brod, MD Guidance:  Ultrasound and fluoroscopic FLUOROSCOPY TIME:  Fluoroscopy Time: 0 minutes 36 seconds (1.0 mGy). MEDICATIONS: Vancomycin 1 g within 1 hour of the procedure ANESTHESIA/SEDATION: Versed 1.0 mg IV; Fentanyl 50 mcg IV; Moderate Sedation Time:  16 minutes The patient was continuously monitored during the procedure by the interventional radiology nurse under my direct supervision. CONTRAST:  None. COMPLICATIONS: None immediate. PROCEDURE: Informed consent was obtained from the patient following explanation of the procedure, risks, benefits and alternatives. The patient understands, agrees and consents for the procedure. All questions were addressed. A time out was performed. Maximal barrier sterile technique utilized including caps, mask, sterile gowns, sterile gloves, large sterile drape, hand hygiene, and 2% chlorhexidine scrub. Under sterile conditions and local anesthesia,  right internal jugular micropuncture venous access was performed with ultrasound. Images were obtained for documentation of the patent right internal jugular vein. A guide wire was inserted followed by a transitional dilator. Next, a 0.035 guidewire was advanced into the IVC with a 5-French catheter. Measurements were obtained from the right venotomy site to the proximal right atrium. In the right infraclavicular chest, a subcutaneous tunnel was created under sterile conditions and local anesthesia. 1% lidocaine with epinephrine was utilized for this. The 19 cm tip to cuff dialysis catheter was tunneled subcutaneously to the venotomy site and inserted into the SVC/RA junction through a valved peel-away sheath. Position was confirmed with fluoroscopy. Images were obtained for documentation. Blood was aspirated from the catheter followed by saline and heparin flushes. The appropriate volume and strength of heparin was instilled in each lumen. Caps were applied. The catheter was secured at the tunnel site with Gelfoam and a pursestring suture. The venotomy site was closed with subcuticular Vicryl suture. Dermabond was applied to the small right neck incision. A dry sterile dressing was applied. The catheter is ready for use. No immediate complications. IMPRESSION: Ultrasound and fluoroscopically guided right internal jugular tunneled hemodialysis catheter (19 cm tip to cuff dialysis catheter). Electronically Signed   By: Jerilynn Mages.  Shick M.D.   On: 04/10/2019 15:03   IR Removal Tun Cv Cath W/O FL  Result Date: 04/17/2019 INDICATION: Patient with history of AKI s/p right IJ tunneled HD catheter placement in IR 04/10/2019 by Dr. Annamaria Boots. Patient with improved renal function and no longer requiring hemodialysis at this time. Request is made for removal of tunneled hemodialysis catheter. EXAM: REMOVAL OF TUNNELED HEMODIALYSIS CATHETER MEDICATIONS: None COMPLICATIONS: None immediate. PROCEDURE: Informed written consent was  obtained from the patient's sister, Eber Hawk, following an explanation of the procedure, risks, benefits and alternatives to treatment. A time out was performed prior to the initiation of the procedure. Maximal barrier sterile technique was utilized including mask, sterile gloves, hand hygiene, and ChloraPrep. Utilizing gentle traction, the catheter was removed intact. Hemostasis was obtained with manual compression. A dressing was placed. The patient tolerated the procedure well without immediate post procedural complication. IMPRESSION: Successful  removal of tunneled dialysis catheter. Read by: Earley Abide, PA-C Electronically Signed   By: Markus Daft M.D.   On: 04/17/2019 16:23   IR US Guide Vasc Access Right  Result Date: 04/10/2019 INDICATION: End-stage renal disease, no current access EXAM: ULTRASOUND GUIDANCE FOR VASCULAR ACCESS RIGHT INTERNAL JUGULAR PERMANENT HEMODIALYSIS CATHETER Date:  04/10/2019 04/10/2019 3:00 pm Radiologist:  Jerilynn Mages. Daryll Brod, MD Guidance:  Ultrasound and fluoroscopic FLUOROSCOPY TIME:  Fluoroscopy Time: 0 minutes 36 seconds (1.0 mGy). MEDICATIONS: Vancomycin 1 g within 1 hour of the procedure ANESTHESIA/SEDATION: Versed 1.0 mg IV; Fentanyl 50 mcg IV; Moderate Sedation Time:  16 minutes The patient was continuously monitored during the procedure by the interventional radiology nurse under my direct supervision. CONTRAST:  None. COMPLICATIONS: None immediate. PROCEDURE: Informed consent was obtained from the patient following explanation of the procedure, risks, benefits and alternatives. The patient understands, agrees and consents for the procedure. All questions were addressed. A time out was performed. Maximal barrier sterile technique utilized including caps, mask, sterile gowns, sterile gloves, large sterile drape, hand hygiene, and 2% chlorhexidine scrub. Under sterile conditions and local anesthesia, right internal jugular micropuncture venous access was performed with  ultrasound. Images were obtained for documentation of the patent right internal jugular vein. A guide wire was inserted followed by a transitional dilator. Next, a 0.035 guidewire was advanced into the IVC with a 5-French catheter. Measurements were obtained from the right venotomy site to the proximal right atrium. In the right infraclavicular chest, a subcutaneous tunnel was created under sterile conditions and local anesthesia. 1% lidocaine with epinephrine was utilized for this. The 19 cm tip to cuff dialysis catheter was tunneled subcutaneously to the venotomy site and inserted into the SVC/RA junction through a valved peel-away sheath. Position was confirmed with fluoroscopy. Images were obtained for documentation. Blood was aspirated from the catheter followed by saline and heparin flushes. The appropriate volume and strength of heparin was instilled in each lumen. Caps were applied. The catheter was secured at the tunnel site with Gelfoam and a pursestring suture. The venotomy site was closed with subcuticular Vicryl suture. Dermabond was applied to the small right neck incision. A dry sterile dressing was applied. The catheter is ready for use. No immediate complications. IMPRESSION: Ultrasound and fluoroscopically guided right internal jugular tunneled hemodialysis catheter (19 cm tip to cuff dialysis catheter). Electronically Signed   By: Jerilynn Mages.  Shick M.D.   On: 04/10/2019 15:03   DG CHEST PORT 1 VIEW  Result Date: 04/14/2019 CLINICAL DATA:  Pneumonia. Acute renal failure. Altered mental status. EXAM: PORTABLE CHEST 1 VIEW COMPARISON:  05/10/2019. FINDINGS: Dual-lumen right central line in stable position. Heart size stable. Low lung volumes. Improved aeration of both lungs from prior exam. No pleural effusion or pneumothorax. IMPRESSION: 1.  Dual-lumen right central line in stable position. 2. Low lung volumes. Improved aeration of both lungs from prior exam. No focal infiltrate. Electronically Signed    By: Marcello Moores  Register   On: 04/14/2019 07:50   DG CHEST PORT 1 VIEW  Result Date: 04/12/2019 CLINICAL DATA:  Initial evaluation for acute shortness of breath. Wheezing. EXAM: PORTABLE CHEST 1 VIEW COMPARISON:  Prior radiograph from 04/06/2007. FINDINGS: Dual lumen right IJ approach central line in place with tip overlying the distal SVC. Moderate cardiomegaly. Mediastinal silhouette within normal limits. Lungs normally inflated. Mild diffuse pulmonary interstitial congestion without frank pulmonary edema. No visible pleural effusion. No focal infiltrates. No pneumothorax. No acute osseous finding. IMPRESSION: Moderate cardiomegaly with mild diffuse  pulmonary interstitial congestion without frank pulmonary edema. Electronically Signed   By: Jeannine Boga M.D.   On: 04/12/2019 04:22   Overnight EEG with video  Result Date: 04/11/2019 Lora Havens, MD     04/12/2019  8:46 AM Patient Name:Jashan Posey Pronto [id number] Epilepsy Attending:Priyanka Barbra Sarks Referring Physician/Provider:Dr. Zeb Comfort Duration: 04/10/2019 1027  to 04/11/2019 1027  Patient history:49 year old male with prior history of seizure disorder who presented with altered mental status and was noted to have left sided focal motor seizures. EEG to evaluate for seizures.  Level of alertness:awake, asleep  AEDs during EEG study: Vimpat  Technical aspects: This EEG study was done with scalp electrodes positioned according to the 10-20 International system of electrode placement. Electrical activity was acquired at a sampling rate of 500Hz  and reviewed with a high frequency filter of 70Hz  and a low frequency filter of 1Hz . EEG data were recorded continuously and digitally stored.  Description: During awake state, no clear posterior dominant rhythm was seen.  Sleep was characterized by vertex waves, sleep spindles (12 to 14 Hz), maximal frontocentral.  EEG showed continuous generalized 3-6hz  theta-delta slowing. Triphasic waves  at 1-1.5hz , generalized were noted at times, when patient was stimulated. Sharp transients were seen in right parieto-occipital region. Abnormality -Continuous slow, generalized - Triphasic waves, generalized  IMPRESSION: This study issuggestive of moderate to severe diffuse encephalopathy non specific to etiology but could be secondary to toxic-metabolic causes.  No seizures ordefiniteepileptiform discharges were seen throughout the recording.  Priyanka Barbra Sarks   Overnight EEG with video  Result Date: 04/08/2019 Lora Havens, MD     04/08/2019 10:59 AM Patient Name: Aryash Vanous MRN: [id number] Epilepsy Attending: Lora Havens Referring Physician/Provider: Dr. Zeb Comfort Duration:  04/07/2019 1528 to 04/08/2019 0918 Patient history: 49 year old male with prior history of seizure disorder who presented with altered mental status.  EEG to evaluate for seizures.  Level of alertness: awake, asleep  AEDs during EEG study:  Vimpat  Technical aspects: This EEG study was done with scalp electrodes positioned according to the 10-20 International system of electrode placement. Electrical activity was acquired at a sampling rate of 500Hz  and reviewed with a high frequency filter of 70Hz  and a low frequency filter of 1Hz . EEG data were recorded continuously and digitally stored.  Description: During awake state, no clear posterior dominant rhythm was seen.    Sleep was characterized by vertex waves, sleep spindles (12 to 14 Hz), maximal frontocentral.  EEG showed continuous rhythmic 2 to 3 Hz delta activity in right temporoparietal region, which at times appeared sharply contoured.  There was also continuous low amplitude 2 to 5 Hz theta - delta slowing in left hemisphere as well as intermittent rhythmic generalized 2 to 3 Hz delta slowing.   Abnormality -Continuous rhythmic delta slowing, right temporoparietal region -Continuous slow, left hemisphere -Intermittent rhythmic slow, generalized   IMPRESSION: This study is suggestive of cortical dysfunction in the right temporoparietal region, likely secondary to underlying abnormality.  There is also evidence of cortical dysfunction in left hemisphere, nonspecific to etiology.    Additionally, there is evidence of mild to moderate diffuse encephalopathy.  No seizures or definite epileptiform discharges were seen throughout the recording. Lora Havens    Microbiology: Recent Results (from the past 240 hour(s))  Culture, Urine     Status: Abnormal   Collection Time: 04/12/19  9:37 AM   Specimen: Urine, Catheterized  Result Value Ref Range Status   Specimen Description URINE, CATHETERIZED  Final   Special Requests   Final    NONE Performed at [facility], [place] [address], [place], [place] [address]    Culture >=100,000 COLONIES/mL ESCHERICHIA COLI (A)  Final   Report Status 04/14/2019 FINAL  Final   Organism ID, Bacteria ESCHERICHIA COLI (A)  Final      Susceptibility   Escherichia coli - MIC*    AMPICILLIN <=2 SENSITIVE Sensitive     CEFAZOLIN <=4 SENSITIVE Sensitive     CEFTRIAXONE <=0.25 SENSITIVE Sensitive     CIPROFLOXACIN <=0.25 SENSITIVE Sensitive     GENTAMICIN <=1 SENSITIVE Sensitive     IMIPENEM <=0.25 SENSITIVE Sensitive     NITROFURANTOIN <=16 SENSITIVE Sensitive     TRIMETH/SULFA <=20 SENSITIVE Sensitive     AMPICILLIN/SULBACTAM <=2 SENSITIVE Sensitive     PIP/TAZO <=4 SENSITIVE Sensitive     * >=100,000 COLONIES/mL ESCHERICHIA COLI     Labs: Basic Metabolic Panel: Recent Labs  Lab 04/13/19 0425 04/14/19 0436 04/15/19 0301 04/16/19 0426 04/17/19 0227 04/18/19 0858 04/19/19 0417  NA 146* 142 141 137 138 139 136  K 4.5 3.7 3.3* 3.4* 3.4* 3.2* 3.4*  CL 110 106 104 106 106 106 109  CO2 20* 24 22 19* 20* 21* 21*  GLUCOSE 183* 228* 162* 206* 129* 134* 131*  BUN 87* 50* 71* 84* 79* 59* 51*  CREATININE 5.54* 3.28* 3.39* 2.86* 2.49* 2.12* 1.97*  CALCIUM 8.9 8.5* 8.3* 8.0* 7.7* 8.4* 8.0*  PHOS 6.2*  5.3* 4.8*  --   --   --   --    Liver Function Tests: Recent Labs  Lab 04/13/19 0425 04/14/19 0436 04/15/19 0301  ALBUMIN 2.9* 2.6* 2.6*   No results for input(s): LIPASE, AMYLASE in the last 168 hours. No results for input(s): AMMONIA in the last 168 hours. CBC: Recent Labs  Lab 04/13/19 0425 04/15/19 0301 04/15/19 1432 04/16/19 0426 04/17/19 0227 04/18/19 0858 04/19/19 0417  WBC 13.4* 8.6 9.5 9.0 12.8* 17.1* 14.6*  NEUTROABS 11.2* 6.7  --   --   --   --   --   HGB 9.5* 8.9* 8.3* 7.1* 8.4* 9.3* 8.5*  HCT 29.1* 27.7* 25.8* 21.3* 24.5* 27.3* 25.1*  MCV 80.8 80.1 81.1 79.5* 81.1 80.8 82.6  PLT 300 289 254 223 237 244 242   Cardiac Enzymes: Recent Labs  Lab 04/13/19 0425  CKTOTAL 687*   BNP: BNP (last 3 results) No results for input(s): BNP in the last 8760 hours.  ProBNP (last 3 results) No results for input(s): PROBNP in the last 8760 hours.  CBG: Recent Labs  Lab 04/18/19 1217 04/18/19 1819 04/18/19 2120 04/19/19 0801 04/19/19 1244  GLUCAP 196* 227* 199* 132* 107*       Signed:  Domenic Polite MD.  [facility] 04/19/2019, 2:49 PM

## 2019-04-19 NOTE — Progress Notes (Signed)
Occupational Therapy Treatment Patient Details Name: [name] MRN: [id number] DOB: 20-May-1970 Today's Date: 04/19/2019    History of present illness 49 yo admitted with metabolic encephalophathy in setting of acute kidney injury uremia rhabdomyolysis 1/1 x2 seizures MRI abnormal medial temporal lobe/hippocampus, L thalamus  UTI (+)  PMH partial seizure HTN marijuana use   OT comments  Pt is able to perform ADLs with supervision to min guard assist.  He demonstrates significant cognitive impairment - scored 21/28 on the Short Blessed Test.  Recommend he have 24 hour supervision at discharge and direct assist with medication management, financial management, and IADLs. Recommend no driving, no working, and [employer].   Follow Up Recommendations  Home health OT;Supervision/Assistance - 24 hour    Equipment Recommendations  None recommended by OT    Recommendations for Other Services      Precautions / Restrictions Precautions Precautions: None       Mobility Bed Mobility Overal bed mobility: Modified Independent                Transfers Overall transfer level: Needs assistance Equipment used: None Transfers: Sit to/from Stand;Stand Pivot Transfers Sit to Stand: Supervision Stand pivot transfers: Supervision            Balance Overall balance assessment: Mild deficits observed, not formally tested                                         ADL either performed or assessed with clinical judgement   ADL Overall ADL's : Needs assistance/impaired     Grooming: Wash/dry hands;Wash/dry face;Oral care;Brushing hair;Supervision/safety;Standing   Upper Body Bathing: Supervision/ safety;Sitting;Standing   Lower Body Bathing: Min guard;Sit to/from stand   Upper Body Dressing : Supervision/safety;Sitting   Lower Body Dressing: Min guard;Sit to/from stand   Toilet Transfer: Supervision/safety;Ambulation;Comfort height toilet   Toileting- Clothing  Manipulation and Hygiene: Supervision/safety;Sit to/from stand       Functional mobility during ADLs: Min guard;Supervision/safety       Vision       Perception     Praxis      Cognition Arousal/Alertness: Awake/alert Behavior During Therapy: WFL for tasks assessed/performed;Flat affect Overall Cognitive Status: Impaired/Different from baseline Area of Impairment: Orientation;Attention;Memory;Following commands;Safety/judgement;Problem solving;Awareness                 Orientation Level: Disoriented to;Time;Situation Current Attention Level: Selective Memory: Decreased short-term memory Following Commands: Follows one step commands consistently Safety/Judgement: Decreased awareness of safety;Decreased awareness of deficits Awareness: Intellectual Problem Solving: Difficulty sequencing;Requires verbal cues General Comments: Pt he initially only has balance deficits.  When asked if he has memory deficits, he denied initially.  He is not oriented to month (unable to even guess), and thinks it's 2022.  He scored 21/28 on the Short Blessed Test demonstrating signficant cognitive impairment.  He demonstrated difficulties with orientation, memory, sequencing, problem solving, attention.  He was able to sequence through simple grooming task, but when asked to simulate showering, he demonstrated difficulty sequencing task        Exercises     Shoulder Instructions       General Comments Pt is eager to discharge home today, and reports family will be with him at all times.  Discussed cognitive deficits and need for direct supervision with medication management, and financial management.  Pt is aware that he should not drive, nor work  Pertinent Vitals/ Pain       Pain Assessment: No/denies pain  Home Living                                          Prior Functioning/Environment              Frequency  Min 2X/week        Progress Toward  Goals  OT Goals(current goals can now be found in the care plan section)  Progress towards OT goals: Progressing toward goals     Plan Discharge plan remains appropriate    Co-evaluation                 AM-PAC OT "6 Clicks" Daily Activity     Outcome Measure   Help from another person eating meals?: A Little Help from another person taking care of personal grooming?: A Little Help from another person toileting, which includes using toliet, bedpan, or urinal?: A Little Help from another person bathing (including washing, rinsing, drying)?: A Little Help from another person to put on and taking off regular upper body clothing?: A Little Help from another person to put on and taking off regular lower body clothing?: A Little 6 Click Score: 18    End of Session    OT Visit Diagnosis: Muscle weakness (generalized) (M62.81);Unsteadiness on feet (R26.81);Other symptoms and signs involving cognitive function   Activity Tolerance Patient tolerated treatment well   Patient Left in bed;with call bell/phone within reach;with bed alarm set   Nurse Communication Need for lift equipment        Time: 1053-1105 OT Time Calculation (min): 12 min  Charges: OT General Charges $OT Visit: 1 Visit OT Treatments $Self Care/Home Management : 8-22 mins  Nilsa Nutting OTR/L Acute Rehabilitation Services Pager [phone] Office [phone]    Lucille Passy M 04/19/2019, 11:50 AM

## 2019-04-19 NOTE — Care Management (Signed)
Patient provided with MATCH letter.  

## 2019-04-19 NOTE — Plan of Care (Signed)

## 2019-04-21 ENCOUNTER — Encounter (HOSPITAL_COMMUNITY)
Admission: EM | Discharge status: Against Medical Advice | Payer: Self-pay | Source: Home / Self Care | Attending: Internal Medicine

## 2019-04-21 ENCOUNTER — Inpatient Hospital Stay (HOSPITAL_COMMUNITY)
Admission: EM | Admit: 2019-04-21 | Discharge: 2019-04-23 | DRG: 378 | Discharge status: Against Medical Advice | Payer: Medicaid Other | Attending: Internal Medicine | Admitting: Internal Medicine

## 2019-04-21 ENCOUNTER — Inpatient Hospital Stay (HOSPITAL_COMMUNITY): Payer: Medicaid Other | Admitting: Anesthesiology

## 2019-04-21 ENCOUNTER — Encounter (HOSPITAL_COMMUNITY): Payer: Self-pay

## 2019-04-21 DIAGNOSIS — R55 Syncope and collapse: Secondary | ICD-10-CM | POA: Diagnosis present

## 2019-04-21 DIAGNOSIS — G40909 Epilepsy, unspecified, not intractable, without status epilepticus: Secondary | ICD-10-CM | POA: Diagnosis present

## 2019-04-21 DIAGNOSIS — I959 Hypotension, unspecified: Secondary | ICD-10-CM | POA: Diagnosis present

## 2019-04-21 DIAGNOSIS — Z5329 Procedure and treatment not carried out because of patient's decision for other reasons: Secondary | ICD-10-CM | POA: Diagnosis present

## 2019-04-21 DIAGNOSIS — Z20822 Contact with and (suspected) exposure to covid-19: Secondary | ICD-10-CM | POA: Diagnosis present

## 2019-04-21 DIAGNOSIS — D72829 Elevated white blood cell count, unspecified: Secondary | ICD-10-CM | POA: Diagnosis present

## 2019-04-21 DIAGNOSIS — E538 Deficiency of other specified B group vitamins: Secondary | ICD-10-CM | POA: Diagnosis present

## 2019-04-21 DIAGNOSIS — N179 Acute kidney failure, unspecified: Secondary | ICD-10-CM

## 2019-04-21 DIAGNOSIS — Z833 Family history of diabetes mellitus: Secondary | ICD-10-CM

## 2019-04-21 DIAGNOSIS — E1165 Type 2 diabetes mellitus with hyperglycemia: Secondary | ICD-10-CM | POA: Diagnosis present

## 2019-04-21 DIAGNOSIS — D62 Acute posthemorrhagic anemia: Secondary | ICD-10-CM | POA: Diagnosis present

## 2019-04-21 DIAGNOSIS — M6282 Rhabdomyolysis: Secondary | ICD-10-CM

## 2019-04-21 DIAGNOSIS — K260 Acute duodenal ulcer with hemorrhage: Principal | ICD-10-CM | POA: Diagnosis present

## 2019-04-21 DIAGNOSIS — Z882 Allergy status to sulfonamides status: Secondary | ICD-10-CM

## 2019-04-21 DIAGNOSIS — K7460 Unspecified cirrhosis of liver: Secondary | ICD-10-CM | POA: Diagnosis present

## 2019-04-21 DIAGNOSIS — N184 Chronic kidney disease, stage 4 (severe): Secondary | ICD-10-CM | POA: Diagnosis present

## 2019-04-21 DIAGNOSIS — E119 Type 2 diabetes mellitus without complications: Secondary | ICD-10-CM

## 2019-04-21 DIAGNOSIS — E1122 Type 2 diabetes mellitus with diabetic chronic kidney disease: Secondary | ICD-10-CM | POA: Diagnosis present

## 2019-04-21 DIAGNOSIS — K922 Gastrointestinal hemorrhage, unspecified: Secondary | ICD-10-CM | POA: Diagnosis present

## 2019-04-21 DIAGNOSIS — D500 Iron deficiency anemia secondary to blood loss (chronic): Secondary | ICD-10-CM

## 2019-04-21 DIAGNOSIS — Z7984 Long term (current) use of oral hypoglycemic drugs: Secondary | ICD-10-CM

## 2019-04-21 DIAGNOSIS — K259 Gastric ulcer, unspecified as acute or chronic, without hemorrhage or perforation: Secondary | ICD-10-CM

## 2019-04-21 DIAGNOSIS — R569 Unspecified convulsions: Secondary | ICD-10-CM

## 2019-04-21 HISTORY — PX: ESOPHAGOGASTRODUODENOSCOPY: SHX5428

## 2019-04-21 HISTORY — PX: HEMOSTASIS CLIP PLACEMENT: SHX6857

## 2019-04-21 LAB — CBC
HCT: 24.2 % — ABNORMAL LOW (ref 39.0–52.0)
HCT: 19.3 % — ABNORMAL LOW (ref 39.0–52.0)
Hemoglobin: 7.7 g/dL — ABNORMAL LOW (ref 13.0–17.0)
Hemoglobin: 6.2 g/dL — CL (ref 13.0–17.0)
MCH: 28 pg (ref 26.0–34.0)
MCH: 26.7 pg (ref 26.0–34.0)
MCHC: 31.8 g/dL (ref 30.0–36.0)
MCHC: 32.1 g/dL (ref 30.0–36.0)
MCV: 88 fL (ref 80.0–100.0)
MCV: 83.2 fL (ref 80.0–100.0)
Platelets: 373 10*3/uL (ref 150–400)
Platelets: 182 10*3/uL (ref 150–400)
RBC: 2.75 MIL/uL — ABNORMAL LOW (ref 4.22–5.81)
RBC: 2.32 MIL/uL — ABNORMAL LOW (ref 4.22–5.81)
RDW: 14.4 % (ref 11.5–15.5)
RDW: 16 % — ABNORMAL HIGH (ref 11.5–15.5)
WBC: 29.5 10*3/uL — ABNORMAL HIGH (ref 4.0–10.5)
WBC: 16.7 10*3/uL — ABNORMAL HIGH (ref 4.0–10.5)
nRBC: 0 % (ref 0.0–0.2)
nRBC: 0 % (ref 0.0–0.2)

## 2019-04-21 LAB — PROTIME-INR
INR: 1.2 (ref 0.8–1.2)
Prothrombin Time: 15.2 seconds (ref 11.4–15.2)

## 2019-04-21 LAB — PREPARE RBC (CROSSMATCH)

## 2019-04-21 LAB — RESPIRATORY PANEL BY RT PCR (FLU A&B, COVID)
Influenza A by PCR: NEGATIVE
Influenza B by PCR: NEGATIVE
SARS Coronavirus 2 by RT PCR: NEGATIVE

## 2019-04-21 LAB — COMPREHENSIVE METABOLIC PANEL
ALT: 35 U/L (ref 0–44)
AST: 22 U/L (ref 15–41)
Albumin: 2.9 g/dL — ABNORMAL LOW (ref 3.5–5.0)
Alkaline Phosphatase: 49 U/L (ref 38–126)
Anion gap: 13 (ref 5–15)
BUN: 65 mg/dL — ABNORMAL HIGH (ref 6–20)
CO2: 13 mmol/L — ABNORMAL LOW (ref 22–32)
Calcium: 8.4 mg/dL — ABNORMAL LOW (ref 8.9–10.3)
Chloride: 110 mmol/L (ref 98–111)
Creatinine, Ser: 2.43 mg/dL — ABNORMAL HIGH (ref 0.61–1.24)
GFR calc Af Amer: 35 mL/min — ABNORMAL LOW (ref >60)
GFR calc non Af Amer: 30 mL/min — ABNORMAL LOW (ref >60)
Glucose, Bld: 314 mg/dL — ABNORMAL HIGH (ref 70–99)
Potassium: 4.5 mmol/L (ref 3.5–5.1)
Sodium: 136 mmol/L (ref 135–145)
Total Bilirubin: 0.6 mg/dL (ref 0.3–1.2)
Total Protein: 5.3 g/dL — ABNORMAL LOW (ref 6.5–8.1)

## 2019-04-21 LAB — CBG MONITORING, ED
Glucose-Capillary: 94 mg/dL (ref 70–99)
Glucose-Capillary: 87 mg/dL (ref 70–99)
Glucose-Capillary: 130 mg/dL — ABNORMAL HIGH (ref 70–99)
Glucose-Capillary: 117 mg/dL — ABNORMAL HIGH (ref 70–99)

## 2019-04-21 LAB — HEMOGLOBIN AND HEMATOCRIT, BLOOD
HCT: 20.7 % — ABNORMAL LOW (ref 39.0–52.0)
Hemoglobin: 6.8 g/dL — CL (ref 13.0–17.0)

## 2019-04-21 LAB — GLUCOSE, CAPILLARY: Glucose-Capillary: 145 mg/dL — ABNORMAL HIGH (ref 70–99)

## 2019-04-21 LAB — POC OCCULT BLOOD, ED: Fecal Occult Bld: POSITIVE — AB

## 2019-04-21 SURGERY — EGD (ESOPHAGOGASTRODUODENOSCOPY)
Anesthesia: Monitor Anesthesia Care | Laterality: Left

## 2019-04-21 MED ORDER — ONDANSETRON HCL 4 MG/2ML IJ SOLN
4.0000 mg | Freq: Four times a day (QID) | INTRAMUSCULAR | Status: DC | PRN
  Administered 2019-04-22: 4 mg via INTRAVENOUS
  Filled 2019-04-21: qty 2

## 2019-04-21 MED ORDER — SODIUM CHLORIDE 0.9 % IV SOLN
80.0000 mg | Freq: Once | INTRAVENOUS | Status: AC
  Administered 2019-04-21: 80 mg via INTRAVENOUS
  Filled 2019-04-21: qty 80

## 2019-04-21 MED ORDER — CHLORHEXIDINE GLUCONATE CLOTH 2 % EX PADS
6.0000 | MEDICATED_PAD | Freq: Every day | CUTANEOUS | Status: DC
  Administered 2019-04-21 – 2019-04-23 (×3): 6 via TOPICAL

## 2019-04-21 MED ORDER — SODIUM CHLORIDE 0.9 % IV SOLN
10.0000 mL/h | Freq: Once | INTRAVENOUS | Status: AC
  Administered 2019-04-21: 10 mL/h via INTRAVENOUS

## 2019-04-21 MED ORDER — PROPOFOL 500 MG/50ML IV EMUL
INTRAVENOUS | Status: DC | PRN
  Administered 2019-04-21: 100 ug/kg/min via INTRAVENOUS

## 2019-04-21 MED ORDER — ACETAMINOPHEN 325 MG PO TABS
650.0000 mg | ORAL_TABLET | Freq: Four times a day (QID) | ORAL | Status: DC | PRN
  Administered 2019-04-22: 650 mg via ORAL
  Filled 2019-04-21: qty 2

## 2019-04-21 MED ORDER — ONDANSETRON HCL 4 MG PO TABS: 4.0000 mg | ORAL_TABLET | Freq: Four times a day (QID) | ORAL | Status: DC | PRN

## 2019-04-21 MED ORDER — INSULIN ASPART 100 UNIT/ML ~~LOC~~ SOLN
0.0000 [IU] | SUBCUTANEOUS | Status: DC
  Administered 2019-04-21: 1 [IU] via SUBCUTANEOUS
  Administered 2019-04-22 (×4): 2 [IU] via SUBCUTANEOUS
  Administered 2019-04-22: 3 [IU] via SUBCUTANEOUS
  Administered 2019-04-22: 2 [IU] via SUBCUTANEOUS
  Administered 2019-04-23: 1 [IU] via SUBCUTANEOUS

## 2019-04-21 MED ORDER — LACTATED RINGERS IV SOLN: INTRAVENOUS | Status: DC | PRN

## 2019-04-21 MED ORDER — ACETAMINOPHEN 650 MG RE SUPP
650.0000 mg | Freq: Four times a day (QID) | RECTAL | Status: DC | PRN
  Administered 2019-04-23: 650 mg via RECTAL
  Filled 2019-04-21: qty 1

## 2019-04-21 MED ORDER — PANTOPRAZOLE SODIUM 40 MG IV SOLR: 40.0000 mg | Freq: Two times a day (BID) | INTRAVENOUS | Status: DC

## 2019-04-21 MED ORDER — PROPOFOL 10 MG/ML IV BOLUS
INTRAVENOUS | Status: DC | PRN
  Administered 2019-04-21: 30 mg via INTRAVENOUS
  Administered 2019-04-21: 20 mg via INTRAVENOUS
  Administered 2019-04-21: 50 mg via INTRAVENOUS

## 2019-04-21 MED ORDER — SODIUM CHLORIDE 0.9% IV SOLUTION: Freq: Once | INTRAVENOUS | Status: AC

## 2019-04-21 MED ORDER — LORAZEPAM 2 MG/ML IJ SOLN: 1.0000 mg | Freq: Once | INTRAMUSCULAR | Status: DC

## 2019-04-21 MED ORDER — SODIUM CHLORIDE 0.9 % IV SOLN
2.0000 g | INTRAVENOUS | Status: DC
  Administered 2019-04-21 – 2019-04-22 (×2): 2 g via INTRAVENOUS
  Filled 2019-04-21: qty 2
  Filled 2019-04-21: qty 20

## 2019-04-21 MED ORDER — LEVETIRACETAM IN NACL 500 MG/100ML IV SOLN
500.0000 mg | Freq: Two times a day (BID) | INTRAVENOUS | Status: DC
  Administered 2019-04-21 – 2019-04-23 (×5): 500 mg via INTRAVENOUS
  Filled 2019-04-21 (×7): qty 100

## 2019-04-21 MED ORDER — PHENYLEPHRINE HCL (PRESSORS) 10 MG/ML IV SOLN
INTRAVENOUS | Status: DC | PRN
  Administered 2019-04-21: 80 ug via INTRAVENOUS
  Administered 2019-04-21: 40 ug via INTRAVENOUS
  Administered 2019-04-21: 120 ug via INTRAVENOUS
  Administered 2019-04-21 (×2): 80 ug via INTRAVENOUS

## 2019-04-21 MED ORDER — SODIUM CHLORIDE 0.9 % IV SOLN
8.0000 mg/h | INTRAVENOUS | Status: DC
  Administered 2019-04-21 – 2019-04-23 (×6): 8 mg/h via INTRAVENOUS
  Filled 2019-04-21 (×10): qty 80

## 2019-04-21 MED ORDER — SODIUM CHLORIDE 0.9 % IV BOLUS
1000.0000 mL | Freq: Once | INTRAVENOUS | Status: AC
  Administered 2019-04-21: 1000 mL via INTRAVENOUS

## 2019-04-21 MED ORDER — ALBUMIN HUMAN 5 % IV SOLN: INTRAVENOUS | Status: DC | PRN

## 2019-04-21 MED ORDER — LIDOCAINE HCL (CARDIAC) PF 100 MG/5ML IV SOSY
PREFILLED_SYRINGE | INTRAVENOUS | Status: DC | PRN
  Administered 2019-04-21: 40 mg via INTRATRACHEAL

## 2019-04-21 NOTE — Anesthesia Postprocedure Evaluation (Signed)
Anesthesia Post Note  Patient: [name]  Procedure(s) Performed: ESOPHAGOGASTRODUODENOSCOPY (EGD) (Left ) HEMOSTASIS CLIP PLACEMENT     Patient location during evaluation: PACU Anesthesia Type: MAC Level of consciousness: awake and alert, oriented and awake Pain management: pain level controlled Vital Signs Assessment: post-procedure vital signs reviewed and stable Respiratory status: spontaneous breathing, nonlabored ventilation and respiratory function stable Cardiovascular status: stable and blood pressure returned to baseline Postop Assessment: no apparent nausea or vomiting Anesthetic complications: no    Last Vitals:  Vitals:   04/21/19 1515 04/21/19 1525  BP: 111/64 118/63  Pulse: 97 96  Resp: 10 (!) 9  Temp:    SpO2: 100% 98%    Last Pain:  Vitals:   04/21/19 1447  TempSrc: Temporal  PainSc: Asleep                 Catalina Gravel

## 2019-04-21 NOTE — ED Triage Notes (Signed)
Pt comes from home for GI bleed that started today, dark red, appx one liter lost, hypotensive 68 systolic, seen here yesterday for disorientation

## 2019-04-21 NOTE — ED Notes (Signed)
Pt placed on bedpan

## 2019-04-21 NOTE — Anesthesia Preprocedure Evaluation (Addendum)
Anesthesia Evaluation  Patient identified by MRN, date of birth, ID band Patient awake    Reviewed: Allergy & Precautions, NPO status , Patient's Chart, lab work & pertinent test results  Airway Mallampati: II  TM Distance: >3 FB Neck ROM: Full    Dental  (+) Teeth Intact, Dental Advisory Given   Pulmonary neg pulmonary ROS,    Pulmonary exam normal breath sounds clear to auscultation       Cardiovascular negative cardio ROS   Rhythm:Regular Rate:Tachycardia     Neuro/Psych Seizures -,     GI/Hepatic PUD, GERD  Medicated,(+) Cirrhosis       , Melena    Endo/Other  diabetes, Type 2, Oral Hypoglycemic Agents  Renal/GU Renal InsufficiencyRenal disease     Musculoskeletal negative musculoskeletal ROS (+)   Abdominal   Peds  Hematology  (+) Blood dyscrasia, anemia ,   Anesthesia Other Findings Day of surgery medications reviewed with the patient.  Reproductive/Obstetrics                            Anesthesia Physical Anesthesia Plan  ASA: III  Anesthesia Plan: MAC   Post-op Pain Management:    Induction: Intravenous  PONV Risk Score and Plan: 1 and Propofol infusion and Treatment may vary due to age or medical condition  Airway Management Planned: Nasal Cannula  Additional Equipment:   Intra-op Plan:   Post-operative Plan:   Informed Consent: I have reviewed the patients History and Physical, chart, labs and discussed the procedure including the risks, benefits and alternatives for the proposed anesthesia with the patient or authorized representative who has indicated his/her understanding and acceptance.     Dental advisory given  Plan Discussed with: CRNA and Anesthesiologist  Anesthesia Plan Comments: (Discussed risks/benefits/alternatives to MAC sedation including need for ventilatory support, hypotension, need for conversion to general anesthesia.  All patient  questions answered.  Patient/guardian wishes to proceed.)        Anesthesia Quick Evaluation

## 2019-04-21 NOTE — Progress Notes (Signed)
No charge progress note.  [name] is a 49 y.o. 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  Patient had his EGD which shows nonbleeding gastric ulcers and duodenal ulcers with double vessels which were clipped. There was clotted blood in the stomach.  -Continue IV Protonix. -Keep 2 units of packed RBCs. -Continue monitoring CBC. -Transfuse if below 7. -Continue rest of his meds.

## 2019-04-21 NOTE — ED Notes (Signed)
Pt agrees to blood and signing consent if he can have one sip of water, MD aware and agreeable

## 2019-04-21 NOTE — ED Notes (Signed)
ED TO INPATIENT HANDOFF REPORT  ED Nurse Name and Phone #: Sophira Rumler 79  S Name/Age/Gender [name] 49 y.o. male Room/Bed: 035C/035C  Code Status   Code Status: Full Code  Home/SNF/Other Home Patient oriented to: self, place, time and situation Is this baseline? Yes   Triage Complete: Triage complete  Chief Complaint Blood loss anemia [D50.0] Acute GI bleeding [K92.2]  Triage Note Pt comes from home for GI bleed that started today, dark red, appx one liter lost, hypotensive 68 systolic, seen here yesterday for disorientation     Allergies Allergies  Allergen Reactions  . Penicillins Hives and Other (See Comments)    Childhood allergy  Did it involve swelling of the face/tongue/throat, SOB, or low BP? Unknown Did it involve sudden or severe rash/hives, skin peeling, or any reaction on the inside of your mouth or nose? Unknown Did you need to seek medical attention at a hospital or doctor's office? Unknown When did it last happen?Pt was a child If all above answers are "NO", may proceed with cephalosporin use. Sister states he just breaks out in hives unsure about other reactions.    . Sulfur Hives and Rash    Level of Care/Admitting Diagnosis ED Disposition    ED Disposition Condition Comment   Admit  Hospital Area: [facility] [[id number]]  Level of Care: Progressive [102]  Admit to Progressive based on following criteria: GI, ENDOCRINE disease patients with GI bleeding, acute liver failure or pancreatitis, stable with diabetic ketoacidosis or thyrotoxicosis (hypothyroid) state.  Covid Evaluation: Asymptomatic Screening Protocol (No Symptoms)  Diagnosis: Acute GI bleeding [id number]  Admitting Physician: Rise Patience [phone]  Attending Physician: Rise Patience [phone]  Estimated length of stay: past midnight tomorrow  Certification:: I certify this patient will need inpatient services for at least 2 midnights        B Medical/Surgery History Past Medical History:  Diagnosis Date  . Cirrhosis ([place])   . Seizures ([place])    Past Surgical History:  Procedure Laterality Date  . BIOPSY  04/17/2019   Procedure: BIOPSY;  Surgeon: Irene Shipper, MD;  Location: [facility] ENDOSCOPY;  Service: Endoscopy;;  . ESOPHAGOGASTRODUODENOSCOPY (EGD) WITH PROPOFOL N/A 04/17/2019   Procedure: ESOPHAGOGASTRODUODENOSCOPY (EGD) WITH PROPOFOL;  Surgeon: Irene Shipper, MD;  Location: [facility] ENDOSCOPY;  Service: Endoscopy;  Laterality: N/A;  . HEMOSTASIS CLIP PLACEMENT  04/17/2019   Procedure: HEMOSTASIS CLIP PLACEMENT;  Surgeon: Irene Shipper, MD;  Location: [facility] ENDOSCOPY;  Service: Endoscopy;;  . IR FLUORO GUIDE CV LINE RIGHT  04/10/2019  . IR REMOVAL TUN CV CATH W/O FL  04/17/2019  . IR US GUIDE VASC ACCESS RIGHT  04/10/2019  . SUBMUCOSAL INJECTION  04/17/2019   Procedure: SUBMUCOSAL INJECTION;  Surgeon: Irene Shipper, MD;  Location: [facility] ENDOSCOPY;  Service: Endoscopy;;     A IV Location/Drains/Wounds Patient Lines/Drains/Airways Status   Active Line/Drains/Airways    Name:   Placement date:   Placement time:   Site:   Days:   Peripheral IV 04/21/19 Left Antecubital   04/21/19    0225    Antecubital   less than 1   Peripheral IV 04/21/19 Right Antecubital   04/21/19    0315    Antecubital   less than 1   Hemodialysis Catheter Right Internal jugular   04/10/19    1452    Internal jugular   11          Intake/Output Last 24 hours  Intake/Output Summary (Last 24  hours) at 04/21/2019 2041 Last data filed at 04/21/2019 1447 Gross per 24 hour  Intake 2910 ml  Output --  Net 2910 ml    Labs/Imaging Results for orders placed or performed during the hospital encounter of 04/21/19 (from the past 48 hour(s))  Comprehensive metabolic panel     Status: Abnormal   Collection Time: 04/21/19  2:07 AM  Result Value Ref Range   Sodium 136 135 - 145 mmol/L   Potassium 4.5 3.5 - 5.1 mmol/L   Chloride 110 98 - 111 mmol/L   CO2 13 (L) 22 - 32  mmol/L   Glucose, Bld 314 (H) 70 - 99 mg/dL   BUN 65 (H) 6 - 20 mg/dL   Creatinine, Ser 2.43 (H) 0.61 - 1.24 mg/dL   Calcium 8.4 (L) 8.9 - 10.3 mg/dL   Total Protein 5.3 (L) 6.5 - 8.1 g/dL   Albumin 2.9 (L) 3.5 - 5.0 g/dL   AST 22 15 - 41 U/L   ALT 35 0 - 44 U/L   Alkaline Phosphatase 49 38 - 126 U/L   Total Bilirubin 0.6 0.3 - 1.2 mg/dL   GFR calc non Af Amer 30 (L) >60 mL/min   GFR calc Af Amer 35 (L) >60 mL/min   Anion gap 13 5 - 15    Comment: Performed at [facility], [address] [place], [place] [address]  CBC     Status: Abnormal   Collection Time: 04/21/19  2:07 AM  Result Value Ref Range   WBC 29.5 (H) 4.0 - 10.5 K/uL   RBC 2.75 (L) 4.22 - 5.81 MIL/uL   Hemoglobin 7.7 (L) 13.0 - 17.0 g/dL   HCT 24.2 (L) 39.0 - 52.0 %   MCV 88.0 80.0 - 100.0 fL   MCH 28.0 26.0 - 34.0 pg   MCHC 31.8 30.0 - 36.0 g/dL   RDW 14.4 11.5 - 15.5 %   Platelets 373 150 - 400 K/uL   nRBC 0.0 0.0 - 0.2 %    Comment: Performed at [facility], [place] [address] [place], [place] [address]  Type and screen [place]     Status: None (Preliminary result)   Collection Time: 04/21/19  2:07 AM  Result Value Ref Range   ABO/RH(D) A POS    Antibody Screen NEG    Sample Expiration      04/24/2019,2359 Performed at [facility], [place] [address] [place],  [address]    Unit Number [id number]    Blood Component Type RBC LR PHER2    Unit division 00    Status of Unit ISSUED    Transfusion Status OK TO TRANSFUSE    Crossmatch Result Compatible    Unit Number [id number]    Blood Component Type RED CELLS,LR    Unit division 00    Status of Unit ISSUED    Transfusion Status OK TO TRANSFUSE    Crossmatch Result Compatible    Unit Number [id number]    Blood Component Type RED CELLS,LR    Unit division 00    Status of Unit ALLOCATED    Transfusion Status OK TO TRANSFUSE    Crossmatch Result Compatible    Unit Number [id number]    Blood  Component Type RED CELLS,LR    Unit division 00    Status of Unit ALLOCATED    Transfusion Status OK TO TRANSFUSE    Crossmatch Result Compatible    Unit Number [id number]  Blood Component Type RED CELLS,LR    Unit division 00    Status of Unit ALLOCATED    Transfusion Status OK TO TRANSFUSE    Crossmatch Result Compatible   Respiratory Panel by RT PCR (Flu A&B, Covid) - Nasopharyngeal Swab     Status: None   Collection Time: 04/21/19  2:10 AM   Specimen: Nasopharyngeal Swab  Result Value Ref Range   SARS Coronavirus 2 by RT PCR NEGATIVE NEGATIVE    Comment: (NOTE) SARS-CoV-2 target nucleic acids are NOT DETECTED. The SARS-CoV-2 RNA is generally detectable in upper respiratoy specimens during the acute phase of infection. The lowest concentration of SARS-CoV-2 viral copies this assay can detect is 131 copies/mL. A negative result does not preclude SARS-Cov-2 infection and should not be used as the sole basis for treatment or other patient management decisions. A negative result may occur with  improper specimen collection/handling, submission of specimen other than nasopharyngeal swab, presence of viral mutation(s) within the areas targeted by this assay, and inadequate number of viral copies (<131 copies/mL). A negative result must be combined with clinical observations, patient history, and epidemiological information. The expected result is Negative. Fact Sheet for Patients:  PinkCheek.be Fact Sheet for Healthcare Providers:  GravelBags.it This test is not yet ap proved or cleared by the [place] FDA and  has been authorized for detection and/or diagnosis of SARS-CoV-2 by FDA under an Emergency Use Authorization (EUA). This EUA will remain  in effect (meaning this test can be used) for the duration of the COVID-19 declaration under Section 564(b)(1) of the Act, 21 U.S.C. section 360bbb-3(b)(1), unless the  authorization is terminated or revoked sooner.    Influenza A by PCR NEGATIVE NEGATIVE   Influenza B by PCR NEGATIVE NEGATIVE    Comment: (NOTE) The Xpert Xpress SARS-CoV-2/FLU/RSV assay is intended as an aid in  the diagnosis of influenza from Nasopharyngeal swab specimens and  should not be used as a sole basis for treatment. Nasal washings and  aspirates are unacceptable for Xpert Xpress SARS-CoV-2/FLU/RSV  testing. Fact Sheet for Patients: PinkCheek.be Fact Sheet for Healthcare Providers: GravelBags.it This test is not yet approved or cleared by the [place] FDA and  has been authorized for detection and/or diagnosis of SARS-CoV-2 by  FDA under an Emergency Use Authorization (EUA). This EUA will remain  in effect (meaning this test can be used) for the duration of the  Covid-19 declaration under Section 564(b)(1) of the Act, 21  U.S.C. section 360bbb-3(b)(1), unless the authorization is  terminated or revoked. Performed at [facility], [place] [address] [place], [place] [address]   Protime-INR     Status: None   Collection Time: 04/21/19  2:10 AM  Result Value Ref Range   Prothrombin Time 15.2 11.4 - 15.2 seconds   INR 1.2 0.8 - 1.2    Comment: (NOTE) INR goal varies based on device and disease states. Performed at [facility], [place] [address], [place], [place] [address]   POC occult blood, ED     Status: Abnormal   Collection Time: 04/21/19  2:20 AM  Result Value Ref Range   Fecal Occult Bld POSITIVE (A) NEGATIVE  Prepare RBC     Status: None   Collection Time: 04/21/19  3:02 AM  Result Value Ref Range   Order Confirmation      ORDER PROCESSED BY BLOOD BANK Performed at [facility], [place] [address] [place], [place] [address]   CBG monitoring,  ED     Status: None   Collection Time: 04/21/19  6:03 AM  Result Value Ref Range   Glucose-Capillary 94 70 - 99 mg/dL  CBG monitoring, ED      Status: None   Collection Time: 04/21/19  8:12 AM  Result Value Ref Range   Glucose-Capillary 87 70 - 99 mg/dL  Prepare RBC     Status: None   Collection Time: 04/21/19  9:28 AM  Result Value Ref Range   Order Confirmation      ORDER PROCESSED BY BLOOD BANK Performed at [facility], [place] [address], [place], [place] [address]   Hemoglobin and hematocrit, blood     Status: Abnormal   Collection Time: 04/21/19 10:30 AM  Result Value Ref Range   Hemoglobin 6.8 (LL) 13.0 - 17.0 g/dL    Comment: REPEATED TO VERIFY THIS CRITICAL RESULT HAS VERIFIED AND BEEN CALLED TO KAREN COBB,RN BY ZELDA BEECH ON 01 12 2021 AT 1114, AND HAS BEEN READ BACK.     HCT 20.7 (L) 39.0 - 52.0 %    Comment: Performed at [facility], [place] [address], [place], [place] [address]  CBG monitoring, ED     Status: Abnormal   Collection Time: 04/21/19  5:20 PM  Result Value Ref Range   Glucose-Capillary 130 (H) 70 - 99 mg/dL  CBC     Status: Abnormal   Collection Time: 04/21/19  6:50 PM  Result Value Ref Range   WBC 16.7 (H) 4.0 - 10.5 K/uL   RBC 2.32 (L) 4.22 - 5.81 MIL/uL   Hemoglobin 6.2 (LL) 13.0 - 17.0 g/dL    Comment: REPEATED TO VERIFY CRITICAL VALUE NOTED.  VALUE IS CONSISTENT WITH PREVIOUSLY REPORTED AND CALLED VALUE.    HCT 19.3 (L) 39.0 - 52.0 %   MCV 83.2 80.0 - 100.0 fL   MCH 26.7 26.0 - 34.0 pg   MCHC 32.1 30.0 - 36.0 g/dL   RDW 16.0 (H) 11.5 - 15.5 %   Platelets 182 150 - 400 K/uL   nRBC 0.0 0.0 - 0.2 %    Comment: Performed at [place] [address], [place], [place] [address]  Prepare RBC     Status: None   Collection Time: 04/21/19  7:55 PM  Result Value Ref Range   Order Confirmation      ORDER PROCESSED BY BLOOD BANK Performed at [facility], [place] [address] [place], [place] [address]   CBG monitoring, ED     Status: Abnormal   Collection Time: 04/21/19  8:06 PM  Result Value Ref Range   Glucose-Capillary 117 (H) 70 - 99 mg/dL   No results  found.  Pending Labs Unresulted Labs (From admission, onward)    Start     Ordered   04/22/19 0500  CBC  once in am  Tomorrow morning,   R     04/21/19 1501          Vitals/Pain Today's Vitals   04/21/19 1550 04/21/19 1630 04/21/19 1845 04/21/19 1930  BP:  121/77 123/78 123/73  Pulse: (!) 101 (!) 110 (!) 102 (!) 101  Resp: 11 15 17  (!) 21  Temp:      TempSrc:      SpO2: 100% 100% 100% 100%  PainSc:        Isolation Precautions No active isolations  Medications Medications  pantoprazole (PROTONIX) 80 mg in sodium chloride 0.9 % 250 mL (0.32 mg/mL) infusion (  8 mg/hr Intravenous New Bag/Given 04/21/19 1434)  pantoprazole (PROTONIX) injection 40 mg ( Intravenous MAR Unhold 04/21/19 1607)  acetaminophen (TYLENOL) tablet 650 mg ( Oral MAR Unhold 04/21/19 1607)    Or  acetaminophen (TYLENOL) suppository 650 mg ( Rectal MAR Unhold 04/21/19 1607)  ondansetron (ZOFRAN) tablet 4 mg ( Oral MAR Unhold 04/21/19 1607)    Or  ondansetron (ZOFRAN) injection 4 mg ( Intravenous MAR Unhold 04/21/19 1607)  insulin aspart (novoLOG) injection 0-9 Units (1 Units Subcutaneous Given 04/21/19 1724)  levETIRAcetam (KEPPRA) IVPB 500 mg/100 mL premix (0 mg Intravenous Stopped 04/21/19 2041)  cefTRIAXone (ROCEPHIN) 2 g in sodium chloride 0.9 % 100 mL IVPB ( Intravenous MAR Unhold 04/21/19 1607)  LORazepam (ATIVAN) injection 1 mg (1 mg Intravenous Not Given 04/21/19 1724)  0.9 %  sodium chloride infusion (Manually program via Guardrails IV Fluids) (has no administration in time range)  sodium chloride 0.9 % bolus 1,000 mL (0 mLs Intravenous Stopped 04/21/19 0359)  pantoprazole (PROTONIX) 80 mg in sodium chloride 0.9 % 100 mL IVPB (0 mg Intravenous Stopped 04/21/19 0419)  0.9 %  sodium chloride infusion (10 mL/hr Intravenous New Bag/Given 04/21/19 0404)  0.9 %  sodium chloride infusion (Manually program via Guardrails IV Fluids) ( Intravenous New Bag/Given 04/21/19 1224)    Mobility walks with person assist Low  fall risk   Focused Assessments   R Recommendations: See Admitting Provider Note  Report given to:   Additional Notes:

## 2019-04-21 NOTE — ED Notes (Signed)
Pt had moderate amount liquid dark stool mixed with urine-- unable to measure due to urination in pan.  Pt is very apologetic for demanding water.

## 2019-04-21 NOTE — Anesthesia Procedure Notes (Signed)
Procedure Name: MAC Date/Time: 04/21/2019 2:25 PM Performed by: Renato Shin, CRNA Pre-anesthesia Checklist: Patient identified, Emergency Drugs available, Suction available and Patient being monitored Patient Re-evaluated:Patient Re-evaluated prior to induction Oxygen Delivery Method: Circle system utilized Preoxygenation: Pre-oxygenation with 100% oxygen Induction Type: IV induction Placement Confirmation: positive ETCO2 and breath sounds checked- equal and bilateral Dental Injury: Teeth and Oropharynx as per pre-operative assessment

## 2019-04-21 NOTE — ED Notes (Addendum)
Sister - [name] -- updated -- [phone] --  States that he was discharged Sunday and was not able to walk when he got home, also states that he was jaundiced. States that when pt is discharged needs to be able to walk and stand on his own.

## 2019-04-21 NOTE — ED Provider Notes (Signed)
[facility] EMERGENCY DEPARTMENT Provider Note   CSN: [id number] Arrival date & time: 04/21/19  0149     History Chief Complaint  Patient presents with  . GI Bleeding    [name] is a 49 y.o. male.  HPI     This is a 49 year old male with a history of seizures, cirrhosis, metabolic encephalopathy, recent history of admission for altered mental status and found to have gastric and duodenal ulcers and acute renal failure who presents with bloody stools.  Patient was discharged from the hospital yesterday.  During his hospitalization he was noted to be anemic.  He had an upper endoscopy that showed nonbleeding gastric and duodenal ulcers.  Patient reports that he went to the bathroom this morning and had a large dark bloody bowel movement.  Per EMS, there was approximately 1 L of blood in the bathroom.  Patient did not want to be transported because "I been in the hospital for a long time."  He denies any chest pain, shortness of breath, abdominal pain, nausea, vomiting, fevers.  During history taking, often the patient will state "I do not know" to specific questions regarding his current presentation and history.  He is alert and oriented.  Per EMS he was tachycardic and had one blood pressure of 60 systolic in route.  Past Medical History:  Diagnosis Date  . Cirrhosis ([place])   . Seizures [facility])     Patient Active Problem List   Diagnosis Date Noted  . Seizures ([place]) 04/19/2019  . Acute blood loss anemia   . Duodenal ulcer hemorrhage   . Acute gastric ulcer without hemorrhage or perforation   . Acute renal failure (ARF) ([place]) 04/05/2019  . Acute metabolic encephalopathy [id number]    Past Surgical History:  Procedure Laterality Date  . BIOPSY  04/17/2019   Procedure: BIOPSY;  Surgeon: Irene Shipper, MD;  Location: [facility] ENDOSCOPY;  Service: Endoscopy;;  . ESOPHAGOGASTRODUODENOSCOPY (EGD) WITH PROPOFOL N/A 04/17/2019   Procedure: ESOPHAGOGASTRODUODENOSCOPY (EGD)  WITH PROPOFOL;  Surgeon: Irene Shipper, MD;  Location: [facility] ENDOSCOPY;  Service: Endoscopy;  Laterality: N/A;  . HEMOSTASIS CLIP PLACEMENT  04/17/2019   Procedure: HEMOSTASIS CLIP PLACEMENT;  Surgeon: Irene Shipper, MD;  Location: [facility] ENDOSCOPY;  Service: Endoscopy;;  . IR FLUORO GUIDE CV LINE RIGHT  04/10/2019  . IR REMOVAL TUN CV CATH W/O FL  04/17/2019  . IR US GUIDE VASC ACCESS RIGHT  04/10/2019  . SUBMUCOSAL INJECTION  04/17/2019   Procedure: SUBMUCOSAL INJECTION;  Surgeon: Irene Shipper, MD;  Location: [facility] ENDOSCOPY;  Service: Endoscopy;;       Family History  Problem Relation Age of Onset  . Diabetes Mother   . Diabetes Sister     Social History   Tobacco Use  . Smoking status: Never Smoker  . Smokeless tobacco: Never Used  Substance Use Topics  . Alcohol use: No  . Drug use: Yes    Types: Marijuana    Home Medications Prior to Admission medications   Medication Sig Start Date End Date Taking? Authorizing Provider  acetaminophen (TYLENOL) 500 MG tablet Take 1 tablet (500 mg total) by mouth every 6 (six) hours as needed for mild pain, moderate pain, fever or headache. 04/19/19   Domenic Polite, MD  fexofenadine (ALLEGRA) 180 MG tablet Take 180 mg by mouth daily as needed for allergies or rhinitis.    [provider]  glimepiride (AMARYL) 2 MG tablet Take 1 tablet (2 mg total) by mouth every  morning. 04/19/19 04/18/20  Domenic Polite, MD  levETIRAcetam (KEPPRA) 500 MG tablet Take 1 tablet (500 mg total) by mouth 2 (two) times daily. 04/19/19   Domenic Polite, MD  pantoprazole (PROTONIX) 40 MG tablet Take 1 tablet (40 mg total) by mouth 2 (two) times daily. 04/19/19   Domenic Polite, MD    Allergies    Penicillins and Sulfur  Review of Systems   Review of Systems  Constitutional: Negative for fever.  Respiratory: Negative for shortness of breath.   Cardiovascular: Negative for chest pain.  Gastrointestinal: Positive for blood in stool. Negative for abdominal pain,  diarrhea, nausea and vomiting.  Genitourinary: Negative for dysuria.  Psychiatric/Behavioral: Negative for confusion.  All other systems reviewed and are negative.   Physical Exam Updated Vital Signs BP 112/67   Pulse 84   Temp (!) 97.3 F (36.3 C) (Temporal)   Resp 12   SpO2 100%   Physical Exam Vitals and nursing note reviewed.  Constitutional:      Appearance: He is well-developed.     Comments: Alert, chronically ill-appearing, nontoxic  HENT:     Head: Normocephalic and atraumatic.     Mouth/Throat:     Mouth: Mucous membranes are dry.  Eyes:     General: Scleral icterus present.     Pupils: Pupils are equal, round, and reactive to light.  Cardiovascular:     Rate and Rhythm: Regular rhythm. Tachycardia present.     Heart sounds: Normal heart sounds. No murmur.  Pulmonary:     Effort: Pulmonary effort is normal. No respiratory distress.     Breath sounds: Normal breath sounds. No wheezing.  Abdominal:     General: Bowel sounds are normal.     Palpations: Abdomen is soft.     Tenderness: There is no abdominal tenderness. There is no rebound.  Genitourinary:    Comments: Blood noted Musculoskeletal:     Cervical back: Neck supple.     Right lower leg: No edema.     Left lower leg: No edema.  Lymphadenopathy:     Cervical: No cervical adenopathy.  Skin:    General: Skin is warm and dry.  Neurological:     Mental Status: He is oriented to person, place, and time.  Psychiatric:     Comments: Flat affect, apathetic     ED Results / Procedures / Treatments   Labs (all labs ordered are listed, but only abnormal results are displayed) Labs Reviewed  COMPREHENSIVE METABOLIC PANEL - Abnormal; Notable for the following components:      Result Value   CO2 13 (*)    Glucose, Bld 314 (*)    BUN 65 (*)    Creatinine, Ser 2.43 (*)    Calcium 8.4 (*)    Total Protein 5.3 (*)    Albumin 2.9 (*)    GFR calc non Af Amer 30 (*)    GFR calc Af Amer 35 (*)    All  other components within normal limits  CBC - Abnormal; Notable for the following components:   WBC 29.5 (*)    RBC 2.75 (*)    Hemoglobin 7.7 (*)    HCT 24.2 (*)    All other components within normal limits  POC OCCULT BLOOD, ED - Abnormal; Notable for the following components:   Fecal Occult Bld POSITIVE (*)    All other components within normal limits  RESPIRATORY PANEL BY RT PCR (FLU A&B, COVID)  PROTIME-INR  TYPE AND SCREEN  PREPARE  RBC (CROSSMATCH)    EKG EKG Interpretation  Date/Time:  Tuesday April 21 2019 02:05:38 EST Ventricular Rate:  101 PR Interval:    QRS Duration: 94 QT Interval:  306 QTC Calculation: 395 R Axis:   94 Text Interpretation: Sinus tachycardia Borderline right axis deviation ST elev, probable normal early repol pattern Baseline wander in lead(s) V3 Confirmed by Thayer Jew [phone]) on 04/21/2019 2:56:50 AM   Radiology No results found.  Procedures Procedures (including critical care time)  CRITICAL CARE Performed by: Merryl Hacker   Total critical care time: 45 minutes  Critical care time was exclusive of separately billable procedures and treating other patients.  Critical care was necessary to treat or prevent imminent or life-threatening deterioration.  Critical care was time spent personally by me on the following activities: development of treatment plan with patient and/or surrogate as well as nursing, discussions with consultants, evaluation of patient's response to treatment, examination of patient, obtaining history from patient or surrogate, ordering and performing treatments and interventions, ordering and review of laboratory studies, ordering and review of radiographic studies, pulse oximetry and re-evaluation of patient's condition.   Medications Ordered in ED Medications  pantoprazole (PROTONIX) 80 mg in sodium chloride 0.9 % 250 mL (0.32 mg/mL) infusion (8 mg/hr Intravenous New Bag/Given 04/21/19 0424)  pantoprazole  (PROTONIX) injection 40 mg (has no administration in time range)  sodium chloride 0.9 % bolus 1,000 mL (0 mLs Intravenous Stopped 04/21/19 0359)  pantoprazole (PROTONIX) 80 mg in sodium chloride 0.9 % 100 mL IVPB (80 mg Intravenous New Bag/Given 04/21/19 0359)  0.9 %  sodium chloride infusion (10 mL/hr Intravenous New Bag/Given 04/21/19 0404)    ED Course  I have reviewed the triage vital signs and the nursing notes.  Pertinent labs & imaging results that were available during my care of the patient were reviewed by me and considered in my medical decision making (see chart for details).  Clinical Course as of Apr 21 423  Tue Apr 21, 2019  0320 Spoke with GI, Dr. Bryan Lemma.  Agrees with plan of care.  Will evaluate.   [CH]    Clinical Course User Index [CH] Cailie Bosshart, Barbette Hair, MD   MDM Rules/Calculators/A&P                      Patient presents with GI bleed.  Likely from known duodenal ulcers.  He is tachycardic.  His blood pressures here have been stable.  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  Hemoglobin has trended down again below 8 at 7.7.  Creatinine is trending back upwards.  He has a leukocytosis to 29.5.  Given absence of infectious etiology, suspect this may be related to recent steroids.  Patient was started on IV Protonix.  Discussed with GI who agrees with plan.  We will plan for admission to the hospitalist service.  4:25 AM  Patient refusing blood unless he can be allowed to have a sip water.  I had a long discussion with the patient.  He does not seem to understand the safety issue regarding his potential procedure.  I question his capacity to make medical dispositions especially in the context of recent encephalopathy.  He has a very flat affect and references "just let me die" several times.  He denies overt suicidal ideation.   Patient was allowed to have one sip of water in order to  be able to transfuse him given that he has evidence of active GI bleed.  Final Clinical Impression(s) / ED Diagnoses Final diagnoses:  Acute GI bleeding  Blood loss anemia    Rx / DC Orders ED Discharge Orders    None       Merryl Hacker, MD 04/21/19 [phone]

## 2019-04-21 NOTE — H&P (Signed)
History and Physical    [name] [id number] DOB: [birth year] DOA: 04/21/2019  PCP: Patient, No Pcp Per  Patient coming from: Home.  Chief Complaint: Rectal bleeding.  HPI: [name] is a 49 y.o. 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  Patient states he did not hit his head.  Patient's sister was at the bedside and called EMS.  EMS on arrival patient was found to be having systolic blood pressure of 68 and large amount of blood in the bowel almost 1 L.  Patient was brought to the ER.  Patient denies chest pain or shortness of breath.  Patient states his symptoms started after he ate some eggs.  ED Course: In the ER patient was hypotensive and tachycardic improved with fluids.  Lab work show creatinine of 2.4 which increased from 1.972 days ago blood glucose of 314 WBC count of 29.5 hemoglobin 7.7 which is a drop from 8.5 platelets 373 INR 1.2 EKG showing sinus tachycardia.  Covid test was negative.  Patient was started on 1 unit of PRBC transfusion Protonix infusion and GI consult by Legrand Como was consulted.  Review of Systems: As per HPI, rest all negative.   Past Medical History:  Diagnosis Date  . Cirrhosis ([place])   . Seizures ([place])     Past Surgical History:  Procedure Laterality Date  . BIOPSY  04/17/2019   Procedure: BIOPSY;  Surgeon: Irene Shipper, MD;  Location: [facility] ENDOSCOPY;  Service: Endoscopy;;  . ESOPHAGOGASTRODUODENOSCOPY  (EGD) WITH PROPOFOL N/A 04/17/2019   Procedure: ESOPHAGOGASTRODUODENOSCOPY (EGD) WITH PROPOFOL;  Surgeon: Irene Shipper, MD;  Location: [facility] ENDOSCOPY;  Service: Endoscopy;  Laterality: N/A;  . HEMOSTASIS CLIP PLACEMENT  04/17/2019   Procedure: HEMOSTASIS CLIP PLACEMENT;  Surgeon: Irene Shipper, MD;  Location: [facility] ENDOSCOPY;  Service: Endoscopy;;  . IR FLUORO GUIDE CV LINE RIGHT  04/10/2019  . IR REMOVAL TUN CV CATH W/O FL  04/17/2019  . IR US GUIDE VASC ACCESS RIGHT  04/10/2019  . SUBMUCOSAL INJECTION  04/17/2019   Procedure: SUBMUCOSAL INJECTION;  Surgeon: Irene Shipper, MD;  Location: [facility] ENDOSCOPY;  Service: Endoscopy;;     reports that he has never smoked. He has never used smokeless tobacco. He reports current drug use. Drug: Marijuana. He reports that he does not drink alcohol.  Allergies  Allergen Reactions  . Penicillins Other (See Comments)    Childhood allergy   . Sulfur Hives and Rash    Family History  Problem Relation Age of Onset  . Diabetes Mother   . Diabetes Sister     Prior to Admission medications   Medication Sig Start Date End Date Taking? Authorizing Provider  acetaminophen (TYLENOL) 500 MG tablet Take 1 tablet (500 mg total) by mouth every 6 (six) hours as needed for mild pain, moderate pain, fever or headache. 04/19/19   Domenic Polite, MD  fexofenadine (ALLEGRA) 180 MG tablet Take 180 mg by mouth daily as needed for allergies or rhinitis.    [provider]  glimepiride (AMARYL) 2 MG tablet Take 1 tablet (2 mg total) by mouth every morning. 04/19/19 04/18/20  Domenic Polite, MD  levETIRAcetam (KEPPRA) 500 MG tablet Take 1 tablet (500 mg total) by mouth 2 (two) times daily. 04/19/19   Domenic Polite, MD  pantoprazole (PROTONIX) 40 MG tablet Take 1 tablet (40 mg total) by mouth 2 (two) times daily. 04/19/19   Domenic Polite, MD    Physical Exam: Constitutional: Moderately built and nourished. Vitals:   04/21/19 0300 04/21/19 0315 04/21/19 0420 04/21/19 0435  BP:  121/68 112/67 124/81 119/78  Pulse: 84 84 94 92  Resp: 15 12 11 11   Temp:   97.9 F (36.6 C) 98 F (36.7 C)  TempSrc:   Oral Temporal  SpO2: 100% 100% 100% 100%   Eyes: Anicteric no pallor. ENMT: No discharge from the ears eyes nose or mouth. Neck: No mass felt.  No neck rigidity. Respiratory: No rhonchi or crepitations. Cardiovascular: S1-S2 heard. Abdomen: Soft nontender bowel sound present. Musculoskeletal: No edema.  No joint effusion. Skin: No rash. Neurologic: Alert awake oriented to time place and person.  Moves all extremities. Psychiatric: Appears normal per normal affect.   Labs on Admission: I have personally reviewed following labs and imaging studies  CBC: Recent Labs  Lab 04/15/19 0301 04/16/19 0426 04/17/19 0227 04/18/19 0858 04/19/19 0417 04/21/19 0207  WBC 8.6 9.0 12.8* 17.1* 14.6* 29.5*  NEUTROABS 6.7  --   --   --   --   --   HGB 8.9* 7.1* 8.4* 9.3* 8.5* 7.7*  HCT 27.7* 21.3* 24.5* 27.3* 25.1* 24.2*  MCV 80.1 79.5* 81.1 80.8 82.6 88.0  PLT 289 223 237 244 242 [id number]   Basic Metabolic Panel: Recent Labs  Lab 04/15/19 0301 04/16/19 0426 04/17/19 0227 04/18/19 0858 04/19/19 0417 04/21/19 0207  NA 141 137 138 139 136 136  K 3.3* 3.4* 3.4* 3.2* 3.4* 4.5  CL 104 106 106 106 109 110  CO2 22 19* 20* 21* 21* 13*  GLUCOSE 162* 206* 129* 134* 131* 314*  BUN 71* 84* 79* 59* 51* 65*  CREATININE 3.39* 2.86* 2.49* 2.12* 1.97* 2.43*  CALCIUM 8.3* 8.0* 7.7* 8.4* 8.0* 8.4*  PHOS 4.8*  --   --   --   --   --    GFR: Estimated Creatinine Clearance: 31.8 mL/min (A) (by C-G formula based on SCr of 2.43 mg/dL (H)). Liver Function Tests: Recent Labs  Lab 04/15/19 0301 04/21/19 0207  AST  --  22  ALT  --  35  ALKPHOS  --  49  BILITOT  --  0.6  PROT  --  5.3*  ALBUMIN 2.6* 2.9*   No results for input(s): LIPASE, AMYLASE in the last 168 hours. No results for input(s): AMMONIA in the last 168 hours. Coagulation Profile: Recent Labs  Lab 04/21/19 0210    INR 1.2   Cardiac Enzymes: No results for input(s): CKTOTAL, CKMB, CKMBINDEX, TROPONINI in the last 168 hours. BNP (last 3 results) No results for input(s): PROBNP in the last 8760 hours. HbA1C: No results for input(s): HGBA1C in the last 72 hours. CBG: Recent Labs  Lab 04/18/19 1217 04/18/19 1819 04/18/19 2120 04/19/19 0801 04/19/19 1244  GLUCAP 196* 227* 199* 132* 107*   Lipid Profile: No results for input(s): CHOL, HDL, LDLCALC, TRIG, CHOLHDL, LDLDIRECT in the last 72 hours. Thyroid Function Tests: No results for input(s): TSH, T4TOTAL, FREET4, T3FREE, THYROIDAB in the last 72 hours. Anemia Panel: No results for input(s): VITAMINB12,  FOLATE, FERRITIN, TIBC, IRON, RETICCTPCT in the last 72 hours. Urine analysis:    Component Value Date/Time   COLORURINE YELLOW 04/05/2019 1242   APPEARANCEUR HAZY (A) 04/05/2019 1242   LABSPEC 1.014 04/05/2019 1242   PHURINE 5.0 04/05/2019 1242   GLUCOSEU 50 (A) 04/05/2019 1242   HGBUR LARGE (A) 04/05/2019 1242   BILIRUBINUR NEGATIVE 04/05/2019 1242   KETONESUR NEGATIVE 04/05/2019 1242   PROTEINUR 100 (A) 04/05/2019 1242   UROBILINOGEN 0.2 09/08/2007 1029   NITRITE NEGATIVE 04/05/2019 1242   LEUKOCYTESUR NEGATIVE 04/05/2019 1242   Sepsis Labs: @LABRCNTIP (procalcitonin:4,lacticidven:4) ) Recent Results (from the past 240 hour(s))  Culture, Urine     Status: Abnormal   Collection Time: 04/12/19  9:37 AM   Specimen: Urine, Catheterized  Result Value Ref Range Status   Specimen Description URINE, CATHETERIZED  Final   Special Requests   Final    NONE Performed at [facility], [place] [address] [place], [place] [address]    Culture >=100,000 COLONIES/mL ESCHERICHIA COLI (A)  Final   Report Status 04/14/2019 FINAL  Final   Organism ID, Bacteria ESCHERICHIA COLI (A)  Final      Susceptibility   Escherichia coli - MIC*    AMPICILLIN <=2 SENSITIVE Sensitive     CEFAZOLIN <=4 SENSITIVE Sensitive     CEFTRIAXONE <=0.25  SENSITIVE Sensitive     CIPROFLOXACIN <=0.25 SENSITIVE Sensitive     GENTAMICIN <=1 SENSITIVE Sensitive     IMIPENEM <=0.25 SENSITIVE Sensitive     NITROFURANTOIN <=16 SENSITIVE Sensitive     TRIMETH/SULFA <=20 SENSITIVE Sensitive     AMPICILLIN/SULBACTAM <=2 SENSITIVE Sensitive     PIP/TAZO <=4 SENSITIVE Sensitive     * >=100,000 COLONIES/mL ESCHERICHIA COLI  Respiratory Panel by RT PCR (Flu A&B, Covid) - Nasopharyngeal Swab     Status: None   Collection Time: 04/21/19  2:10 AM   Specimen: Nasopharyngeal Swab  Result Value Ref Range Status   SARS Coronavirus 2 by RT PCR NEGATIVE NEGATIVE Final    Comment: (NOTE) SARS-CoV-2 target nucleic acids are NOT DETECTED. The SARS-CoV-2 RNA is generally detectable in upper respiratoy specimens during the acute phase of infection. The lowest concentration of SARS-CoV-2 viral copies this assay can detect is 131 copies/mL. A negative result does not preclude SARS-Cov-2 infection and should not be used as the sole basis for treatment or other patient management decisions. A negative result may occur with  improper specimen collection/handling, submission of specimen other than nasopharyngeal swab, presence of viral mutation(s) within the areas targeted by this assay, and inadequate number of viral copies (<131 copies/mL). A negative result must be combined with clinical observations, patient history, and epidemiological information. The expected result is Negative. Fact Sheet for Patients:  PinkCheek.be Fact Sheet for Healthcare Providers:  GravelBags.it This test is not yet ap proved or cleared by the [place] FDA and  has been authorized for detection and/or diagnosis of SARS-CoV-2 by FDA under an Emergency Use Authorization (EUA). This EUA will remain  in effect (meaning this test can be used) for the duration of the COVID-19 declaration under Section 564(b)(1) of the Act, 21  U.S.C. section 360bbb-3(b)(1), unless the authorization is terminated or revoked sooner.    Influenza A by PCR NEGATIVE NEGATIVE Final   Influenza B by PCR NEGATIVE NEGATIVE Final    Comment: (NOTE) The Xpert Xpress SARS-CoV-2/FLU/RSV assay is intended as an aid in  the diagnosis of influenza from Nasopharyngeal swab specimens and  should not be  used as a sole basis for treatment. Nasal washings and  aspirates are unacceptable for Xpert Xpress SARS-CoV-2/FLU/RSV  testing. Fact Sheet for Patients: PinkCheek.be Fact Sheet for Healthcare Providers: GravelBags.it This test is not yet approved or cleared by the [place] FDA and  has been authorized for detection and/or diagnosis of SARS-CoV-2 by  FDA under an Emergency Use Authorization (EUA). This EUA will remain  in effect (meaning this test can be used) for the duration of the  Covid-19 declaration under Section 564(b)(1) of the Act, 21  U.S.C. section 360bbb-3(b)(1), unless the authorization is  terminated or revoked. Performed at [facility], [place] [address] [place], [place] [address]      Radiological Exams on Admission: No results found.  EKG: Independently reviewed.  Sinus tachycardia.  Assessment/Plan Principal Problem:   Acute GI bleeding Active Problems:   Acute blood loss anemia   Seizures ([facility])   Diabetes mellitus type 2 in nonobese ([place])    1. Acute GI bleed likely upper GI given the recent EGD showing peptic ulcer disease for which patient has been started on Protonix infusion and 1 unit of PRBC transfusion has been ordered as above GI has been notified and will be seeing patient in consult.  We will keep patient n.p.o. in anticipation of EGD.  Since patient's chart mentioned that patient has cirrhosis will cover with antibiotics. 2. Syncope likely from hypotensive episode from bleeding.  Continue to monitor continue hydration. 3. Acute on  chronic kidney disease stage IV recently requiring dialysis.  Presently making urine.  Continue hydration.  Is receiving 1 unit of PRBC. 4. History of seizures presently we will keep patient on IV Keppra since patient n.p.o. 5. Diabetes mellitus type 2 with hyperglycemia we will keep patient on sliding scale coverage. 6. Recent admission for encephalopathy in the setting of uremia and seizures. 7. Leukocytosis could be reactionary or recent use of steroids.  Patient afebrile.  However since patient has history of cirrhosis per the chart we will keep patient antibiotics.  Given acute GI bleed with hypotension and SIRS picture will need inpatient status.   DVT prophylaxis: SCDs due to acute GI bleed. Code Status: Full code. Family Communication: Discussed with patient. Disposition Plan: Home when stable. Consults called: [facility]. Admission status: Inpatient.   Rise Patience MD [facility] Pager [phone].  If 7PM-7AM, please contact night-coverage [email or web address] Password [facility]  04/21/2019, 5:19 AM

## 2019-04-21 NOTE — ED Notes (Signed)
Pt is refusing blood transfusion is he is not allowed to get water

## 2019-04-21 NOTE — ED Notes (Signed)
Pt requesting more water to drink-- received 1/2 glass per admitting dr-- this nurse told him that he was supposed to be NPO-- pt stated "Then I am going to need to go home" this nurse stressed to the patient that he is bleeding inside, and that he could start throwing up and cause further bleeding. Pt stated "water won't make me bleed"  Admitting dr notified- stated he could have a little bit more water

## 2019-04-21 NOTE — Op Note (Signed)
[facility] Patient Name: [name] Procedure Date : 04/21/2019 MRN: [id number] Attending MD: Jerene Bears , MD Date of Birth: Oct 10, 1970 CSN: [id number] Age: 49 Admit Type: Inpatient Procedure:                Upper GI endoscopy Indications:              Acute post hemorrhagic anemia, , Hematochezia in                            setting of recent UGI bleed due to peptic ulcer                            disease, EGD 04/17/2019 with injection and clip x 1 Providers:                Lajuan Lines. Hilarie Fredrickson, MD, Angus Seller, William Dalton,                            Technician, Theodora Blow, Technician Referring MD:             [facility] Medicines:                Monitored Anesthesia Care Complications:            No immediate complications. Estimated Blood Loss:     Estimated blood loss: none. Procedure:                Pre-Anesthesia Assessment:                           - Prior to the procedure, a History and Physical                            was performed, and patient medications and                            allergies were reviewed. The patient's tolerance of                            previous anesthesia was also reviewed. The risks                            and benefits of the procedure and the sedation                            options and risks were discussed with the patient.                            All questions were answered, and informed consent                            was obtained. Prior Anticoagulants: The patient has                            taken no previous anticoagulant or antiplatelet  agents. ASA Grade Assessment: III - A patient with                            severe systemic disease. After reviewing the risks                            and benefits, the patient was deemed in                            satisfactory condition to undergo the procedure.                           After obtaining informed consent,  the endoscope was                            passed under direct vision. Throughout the                            procedure, the patient's blood pressure, pulse, and                            oxygen saturations were monitored continuously. The                            [id number] Olympus gastroscope was                            introduced through the mouth, and advanced to the                            second part of duodenum. The upper GI endoscopy was                            accomplished without difficulty. The patient                            tolerated the procedure well. Scope In: Scope Out: Findings:      The examined esophagus was normal.      Retained dark fluid (hematin) was found in the gastric fundus.       Aspiration of fluid was performed but incomplete due to clotted blood       which could not be cleared. This interfered with visualization in this       segment of stomach.      One non-bleeding superficial gastric ulcer with no stigmata of bleeding       was found in the prepyloric region of the stomach. The lesion was 4 mm       in largest dimension.      Red and clotted blood was found in the duodenal bulb. This was lavaged       and aspirated.      Two cratered duodenal ulcers, 1 with a visible vessel were found in the       duodenal bulb. The largest lesion was 10 mm in largest dimension. There       was a previously placed endoclip  at the edge of the more distal ulcer       (same ulcer that now has visible vessel). For hemostasis, two additional       hemostatic clips were successfully placed (1 DuraClip and 1 Resolution       Clip). There was no bleeding at the end of the maneuver. The ulcer edges       were able to be approximated. Impression:               - Normal esophagus.                           - Non-bleeding gastric ulcer with no stigmata of                            bleeding.                           - Blood in the duodenal bulb.                            - Duodenal ulcers with a visible vessel. Additional                            clips were placed x 2.                           - No specimens collected. Moderate Sedation:      N/A Recommendation:           - Return patient to hospital ward for ongoing care.                           - Advance diet as tolerated. Start with full                            liquids now.                           - Continue present medications including PPI gtt x                            72 hours.                           - No ASA or NSAIDs                           - Clo testing was negative 4 days ago.                           - After infusion, BID PPI is recommended for 8-12                            weeks.                           - Monitor Hgb and response to transfusion. Procedure Code(s):        ---  Professional ---                           [id number], Esophagogastroduodenoscopy, flexible,                            transoral; with control of bleeding, any method Diagnosis Code(s):        --- Professional ---                           K25.9, Gastric ulcer, unspecified as acute or                            chronic, without hemorrhage or perforation                           K92.2, Gastrointestinal hemorrhage, unspecified                           K26.4, Chronic or unspecified duodenal ulcer with                            hemorrhage                           D62, Acute posthemorrhagic anemia                           K92.1, Melena (includes Hematochezia) CPT copyright 2019 American Medical Association. All rights reserved. The codes documented in this report are preliminary and upon coder review may  be revised to meet current compliance requirements. Jerene Bears, MD 04/21/2019 3:04:20 PM This report has been signed electronically. Number of Addenda: 0

## 2019-04-21 NOTE — ED Notes (Signed)
Pt requesting to speak with Admitting doctor regarding stay in hospital.

## 2019-04-21 NOTE — ED Notes (Signed)
Pt declined to have anyone called at this time

## 2019-04-21 NOTE — ED Notes (Signed)
No needs from pt at this time. 

## 2019-04-21 NOTE — Consult Note (Addendum)
[facility] Consult: 8:30 AM 04/21/2019  LOS: 0 days    Referring Provider: Dr Reesa Chew  Primary Care Physician:  Patient, No Pcp Per Primary Gastroenterologist:  unassigned    Reason for Consultation:  melena   HPI: [name] is a 49 y.o. male.  PMH seizures.  Fatty liver, gallstones, subcentimeter/T STC lesion in right hepatic lobe per 11/2017 ultrasound, CT.  Pt endorses history cirrhosis but this is not confirmed.  Admitted 04/05/2019 through 0/93/23 with metabolic encephalopathy, AKI, uremia, rhabdomyolysis, seizures, new dx DM, ABL anemia.  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  During hospitalization developed melena while on DVT prevention SQ heparin (discontinued).  Anemia noted, but platelets, INR okay.  Hgb nadir 7.1, received PRBC x 1.  Ammonia, T bili, alk phos normal but there were elevations to transaminases of 222/156 at maximum.  Anemia studies confirmed folate deficiency but not iron or B12 deficiency.  Hepatitis serologies negative. 04/17/2019 EGD: Partial esophageal ring.  Small, nonbleeding gastric ulcer.  2, nonbleeding duodenal ulcers, 1 with stigmata of recent bleeding treated with epinephrine and hemo clip.  No evidence of portal gastropathy or varices.  CLO biopsy. Repeat ultrasound showed fatty liver, unable to exclude superimposed inflammation and fibrosis.  Patient reported anorexia without weight loss for the past few months.   To the time of the admission patient admitted to drinking 4 beers at a time 3 or 4 times per month and smoking marijuana about 5 times a month. Pt discharged on Protonix 40 mg bid, Glimepiride, no steroids.   Patient has not picked up prescriptions yet.  Return to the ED early this morning after  passing close to 1 L of dark red bloody stool, SBP 68 on route per EMS, tachycardic.  Since arrival to the ED is been normotensive, ttachycardia to 1tens resolved, excellent O2 sats Hgb 7.7, was 8.5 on 1/10.  Platelets, INR normal.  Glucose 314.  BUN/creatinine 65/2.4, up from 51/1.9 at discharge.  LFTs normal. 1 PRBC transfused, PPI drip initiated Passed 2 additional burgundy/bloody stools since arrival.       Past Medical History:  Diagnosis Date  . Cirrhosis ([place])   . Seizures ([place])     Past Surgical History:  Procedure Laterality Date  . BIOPSY  04/17/2019   Procedure: BIOPSY;  Surgeon: Irene Shipper, MD;  Location: [facility] ENDOSCOPY;  Service: Endoscopy;;  . ESOPHAGOGASTRODUODENOSCOPY (EGD) WITH PROPOFOL N/A 04/17/2019   Procedure: ESOPHAGOGASTRODUODENOSCOPY (EGD) WITH PROPOFOL;  Surgeon: Irene Shipper, MD;  Location: [facility] ENDOSCOPY;  Service: Endoscopy;  Laterality: N/A;  . HEMOSTASIS CLIP PLACEMENT  04/17/2019   Procedure: HEMOSTASIS CLIP PLACEMENT;  Surgeon: Irene Shipper, MD;  Location: [facility] ENDOSCOPY;  Service: Endoscopy;;  . IR FLUORO GUIDE CV LINE RIGHT  04/10/2019  . IR REMOVAL TUN CV CATH W/O FL  04/17/2019  . IR US GUIDE VASC ACCESS RIGHT  04/10/2019  . SUBMUCOSAL INJECTION  04/17/2019   Procedure: SUBMUCOSAL INJECTION;  Surgeon: Irene Shipper, MD;  Location: [place];  Service:  Endoscopy;;    Prior to Admission medications   Medication Sig Start Date End Date Taking? Authorizing Provider  acetaminophen (TYLENOL) 500 MG tablet Take 1 tablet (500 mg total) by mouth every 6 (six) hours as needed for mild pain, moderate pain, fever or headache. 04/19/19  Yes Domenic Polite, MD  fexofenadine (ALLEGRA) 180 MG tablet Take 180 mg by mouth daily as needed for allergies or rhinitis.   Yes [provider]  glimepiride (AMARYL) 2 MG tablet Take 1 tablet (2 mg total) by mouth every morning. 04/19/19 04/18/20 Yes Domenic Polite, MD  levETIRAcetam (KEPPRA) 500 MG tablet Take 1 tablet (500 mg  total) by mouth 2 (two) times daily. 04/19/19  Yes Domenic Polite, MD  pantoprazole (PROTONIX) 40 MG tablet Take 1 tablet (40 mg total) by mouth 2 (two) times daily. 04/19/19  Yes Domenic Polite, MD    Scheduled Meds: . insulin aspart  0-9 Units Subcutaneous Q4H  . [START ON 04/24/2019] pantoprazole  40 mg Intravenous Q12H   Infusions: . cefTRIAXone (ROCEPHIN)  IV    . levETIRAcetam    . pantoprozole (PROTONIX) infusion 8 mg/hr (04/21/19 0424)   PRN Meds: acetaminophen **OR** acetaminophen, ondansetron **OR** ondansetron (ZOFRAN) IV   Allergies as of 04/21/2019 - Review Complete 04/21/2019  Allergen Reaction Noted  . Penicillins Hives and Other (See Comments) 12/11/2015  . Sulfur Hives and Rash 04/05/2019    Family History  Problem Relation Age of Onset  . Diabetes Mother   . Diabetes Sister     Social History   Socioeconomic History  . Marital status: Single    Spouse name: Not on file  . Number of children: Not on file  . Years of education: Not on file  . Highest education level: Not on file  Occupational History  . Not on file  Tobacco Use  . Smoking status: Never Smoker  . Smokeless tobacco: Never Used  Substance and Sexual Activity  . Alcohol use: No  . Drug use: Yes    Types: Marijuana  . Sexual activity: Not on file  Other Topics Concern  . Not on file  Social History Narrative  . Not on file   Social Determinants of Health   Financial Resource Strain:   . Difficulty of Paying Living Expenses: Not on file  Food Insecurity:   . Worried About Charity fundraiser in the Last Year: Not on file  . Ran Out of Food in the Last Year: Not on file  Transportation Needs:   . Lack of Transportation (Medical): Not on file  . Lack of Transportation (Non-Medical): Not on file  Physical Activity:   . Days of Exercise per Week: Not on file  . Minutes of Exercise per Session: Not on file  Stress:   . Feeling of Stress : Not on file  Social Connections:   .  Frequency of Communication with Friends and Family: Not on file  . Frequency of Social Gatherings with Friends and Family: Not on file  . Attends Religious Services: Not on file  . Active Member of Clubs or Organizations: Not on file  . Attends Archivist Meetings: Not on file  . Marital Status: Not on file  Intimate Partner Violence:   . Fear of Current or Ex-Partner: Not on file  . Emotionally Abused: Not on file  . Physically Abused: Not on file  . Sexually Abused: Not on file    REVIEW OF SYSTEMS: Constitutional: Hungry.  Feels somewhat weak. ENT:  No nose bleeds Pulm: No trouble breathing.  No cough. CV:  No palpitations, no LE edema.  No angina GU:  No hematuria, no frequency GI: See HPI. Heme: No unusual or excessive bleeding or bruising other than the GI bleeding. Transfusions: See HPI. Neuro:  No headaches, no peripheral tingling or numbness.  No seizure, no syncope. Derm:  No itching, no rash or sores.  Endocrine:  No sweats or chills.  No polyuria or dysuria Immunization: Reviewed. Travel:  None beyond local counties in last few months.    PHYSICAL EXAM: Vital signs in last 24 hours: Vitals:   04/21/19 0645 04/21/19 0700  BP: 118/77 122/81  Pulse: 82 91  Resp: 12 12  Temp:    SpO2: 100% 100%   Wt Readings from Last 3 Encounters:  04/19/19 61.2 kg  11/20/17 63.5 kg  12/12/15 67.7 kg    General: Thin, agitated, alert, nonacutely ill-appearing. Head: No signs of head trauma.  No facial asymmetry or swelling. Eyes: No conjunctival pallor.  No scleral icterus. Ears: Not hard of hearing Nose: No congestion, no discharge. Mouth: Tongue midline.  Mucosa moist, pink, clear. Neck: No JVD, no masses, no thyromegaly. Lungs: Clear bilaterally.  No labored breathing.  No cough. Heart: RRR.  No MRG.  S1, S2 present. Abdomen: Thin, soft, nontender.  No HSM, masses, bruits, hernias.   Rectal: Deferred. Musc/Skeltl: [place] stool reported by  RN. Extremities: Thin, no CCE. Neurologic: Oriented x3.  Moves all 4 limbs.  No tremors.  No gross deficits. Skin: No rash, no sores, no suspicious lesions. Nodes: No cervical adenopathy. Psych: Agitated.  Fluid speech.  Assisting on drinking water otherwise says he is going to leave.  Intake/Output from previous day: 01/11 0701 - 01/12 0700 In: 1100 [IV Piggyback:1100] Out: -  Intake/Output this shift: No intake/output data recorded.  LAB RESULTS: Recent Labs    04/18/19 0858 04/19/19 0417 04/21/19 0207  WBC 17.1* 14.6* 29.5*  HGB 9.3* 8.5* 7.7*  HCT 27.3* 25.1* 24.2*  PLT 244 242 373   BMET Lab Results  Component Value Date   NA 136 04/21/2019   NA 136 04/19/2019   NA 139 04/18/2019   K 4.5 04/21/2019   K 3.4 (L) 04/19/2019   K 3.2 (L) 04/18/2019   CL 110 04/21/2019   CL 109 04/19/2019   CL 106 04/18/2019   CO2 13 (L) 04/21/2019   CO2 21 (L) 04/19/2019   CO2 21 (L) 04/18/2019   GLUCOSE 314 (H) 04/21/2019   GLUCOSE 131 (H) 04/19/2019   GLUCOSE 134 (H) 04/18/2019   BUN 65 (H) 04/21/2019   BUN 51 (H) 04/19/2019   BUN 59 (H) 04/18/2019   CREATININE 2.43 (H) 04/21/2019   CREATININE 1.97 (H) 04/19/2019   CREATININE 2.12 (H) 04/18/2019   CALCIUM 8.4 (L) 04/21/2019   CALCIUM 8.0 (L) 04/19/2019   CALCIUM 8.4 (L) 04/18/2019   LFT Recent Labs    04/21/19 0207  PROT 5.3*  ALBUMIN 2.9*  AST 22  ALT 35  ALKPHOS 49  BILITOT 0.6   PT/INR Lab Results  Component Value Date   INR 1.2 04/21/2019   INR 1.1 04/06/2019   Hepatitis Panel No results for input(s): HEPBSAG, HCVAB, HEPAIGM, HEPBIGM in the last 72 hours. C-Diff No components found for: CDIFF Lipase     Component Value Date/Time   LIPASE 32 11/20/2017 0713    Drugs of Abuse     Component Value Date/Time   LABOPIA NONE DETECTED 04/05/2019 1242  COCAINSCRNUR NONE DETECTED 04/05/2019 1242   COCAINSCRNUR NEG 10/13/2007 2110   LABBENZ NONE DETECTED 04/05/2019 1242   LABBENZ NEG 10/13/2007 2110    AMPHETMU NONE DETECTED 04/05/2019 1242   THCU POSITIVE (A) 04/05/2019 1242   LABBARB NONE DETECTED 04/05/2019 1242     RADIOLOGY STUDIES: No results found.    IMPRESSION:   *    Recurrent GI bleed.  EGD 04/17/2019 for GI bleed confirmed nonbleeding gastric ulcers and duodenal ulcers.  1 duodenal ulcer with stigmata of recent bleed was treated with epinephrine and Endo Clip.  Discharged 3 days ago on Protonix 40 po bid.   *    Patient reported history cirrhosis but this is not a confirmed diagnosis.  Did not have varices or portal gastropathy on recent EGD.  Ultrasound showed fatty liver and possible inflammation and fibrosis.  Hep ABC serologies negative.    *     Anemia in setting of GI bleed.  S/p 1 PRBC. Folate deficient on recent lab, no folic acid prescribed at discharge  *    DM 2.  New diagnosis made at recent admission.  *     COVID-19 negative.    *     Agitation.  Suspect some sort of yet to be diagnosed underlying psychiatric disorder.    PLAN:     *   EGD.  1330 today.  Patient threatening to leave if he cannot get it something to drink so in addition to about 6 ounces of water I asked the nurse to give him about half of a cup of ice chips.  *    Since the Hb of 7.7, he has received 1 PRBC.  I do not feel he needs another unit of blood unless repeat Hb is less than 7 since he is not tachycardic or hypotensive. I ordered Hb and hematocrit.  Discontinued the order for the PRBC #2  *   Note that admitting physician ordered Rocephin which he has received.  This was for SBP prophylaxis, not clear that he needs this  *    Continue Protonix drip for now.  Azucena Freed  04/21/2019, 8:30 AM Phone [phone]

## 2019-04-21 NOTE — Transfer of Care (Signed)
Immediate Anesthesia Transfer of Care Note  Patient: [name]  Procedure(s) Performed: ESOPHAGOGASTRODUODENOSCOPY (EGD) (Left ) HEMOSTASIS CLIP PLACEMENT  Patient Location: Endoscopy Unit   Anesthesia Type:MAC  Level of Consciousness: drowsy and patient cooperative  Airway & Oxygen Therapy: Patient Spontanous Breathing and Patient connected to nasal cannula oxygen  Post-op Assessment: Report given to RN and Post -op Vital signs reviewed and stable  Post vital signs: Reviewed and stable  Last Vitals:  Vitals Value Taken Time  BP    Temp    Pulse 99 04/21/19 1447  Resp 11 04/21/19 1447  SpO2 100 % 04/21/19 1447  Vitals shown include unvalidated device data.  Last Pain:  Vitals:   04/21/19 1302  TempSrc: Temporal  PainSc: 0-No pain         Complications: No apparent anesthesia complications

## 2019-04-22 ENCOUNTER — Encounter: Payer: Self-pay | Admitting: *Deleted

## 2019-04-22 DIAGNOSIS — D500 Iron deficiency anemia secondary to blood loss (chronic): Secondary | ICD-10-CM

## 2019-04-22 DIAGNOSIS — K260 Acute duodenal ulcer with hemorrhage: Principal | ICD-10-CM

## 2019-04-22 LAB — CBC
HCT: 26.5 % — ABNORMAL LOW (ref 39.0–52.0)
HCT: 22.6 % — ABNORMAL LOW (ref 39.0–52.0)
HCT: 21.2 % — ABNORMAL LOW (ref 39.0–52.0)
Hemoglobin: 9.4 g/dL — ABNORMAL LOW (ref 13.0–17.0)
Hemoglobin: 7.8 g/dL — ABNORMAL LOW (ref 13.0–17.0)
Hemoglobin: 7.6 g/dL — ABNORMAL LOW (ref 13.0–17.0)
MCH: 31.1 pg (ref 26.0–34.0)
MCH: 30.5 pg (ref 26.0–34.0)
MCH: 31.3 pg (ref 26.0–34.0)
MCHC: 35.5 g/dL (ref 30.0–36.0)
MCHC: 34.5 g/dL (ref 30.0–36.0)
MCHC: 35.8 g/dL (ref 30.0–36.0)
MCV: 87.7 fL (ref 80.0–100.0)
MCV: 88.3 fL (ref 80.0–100.0)
MCV: 87.2 fL (ref 80.0–100.0)
Platelets: 109 10*3/uL — ABNORMAL LOW (ref 150–400)
Platelets: 74 10*3/uL — ABNORMAL LOW (ref 150–400)
Platelets: 100 10*3/uL — ABNORMAL LOW (ref 150–400)
RBC: 3.02 MIL/uL — ABNORMAL LOW (ref 4.22–5.81)
RBC: 2.56 MIL/uL — ABNORMAL LOW (ref 4.22–5.81)
RBC: 2.43 MIL/uL — ABNORMAL LOW (ref 4.22–5.81)
RDW: 14.6 % (ref 11.5–15.5)
RDW: 14.8 % (ref 11.5–15.5)
RDW: 15 % (ref 11.5–15.5)
WBC: 29.8 10*3/uL — ABNORMAL HIGH (ref 4.0–10.5)
WBC: 24.9 10*3/uL — ABNORMAL HIGH (ref 4.0–10.5)
WBC: 27.7 10*3/uL — ABNORMAL HIGH (ref 4.0–10.5)
nRBC: 0 % (ref 0.0–0.2)
nRBC: 0 % (ref 0.0–0.2)
nRBC: 0.1 % (ref 0.0–0.2)

## 2019-04-22 LAB — BASIC METABOLIC PANEL
Anion gap: 6 (ref 5–15)
BUN: 60 mg/dL — ABNORMAL HIGH (ref 6–20)
CO2: 14 mmol/L — ABNORMAL LOW (ref 22–32)
Calcium: 7.1 mg/dL — ABNORMAL LOW (ref 8.9–10.3)
Chloride: 116 mmol/L — ABNORMAL HIGH (ref 98–111)
Creatinine, Ser: 1.81 mg/dL — ABNORMAL HIGH (ref 0.61–1.24)
GFR calc Af Amer: 50 mL/min — ABNORMAL LOW (ref >60)
GFR calc non Af Amer: 43 mL/min — ABNORMAL LOW (ref >60)
Glucose, Bld: 158 mg/dL — ABNORMAL HIGH (ref 70–99)
Potassium: 4.9 mmol/L (ref 3.5–5.1)
Sodium: 136 mmol/L (ref 135–145)

## 2019-04-22 LAB — GLUCOSE, CAPILLARY
Glucose-Capillary: 202 mg/dL — ABNORMAL HIGH (ref 70–99)
Glucose-Capillary: 152 mg/dL — ABNORMAL HIGH (ref 70–99)
Glucose-Capillary: 148 mg/dL — ABNORMAL HIGH (ref 70–99)
Glucose-Capillary: 170 mg/dL — ABNORMAL HIGH (ref 70–99)
Glucose-Capillary: 139 mg/dL — ABNORMAL HIGH (ref 70–99)

## 2019-04-22 LAB — MRSA PCR SCREENING: MRSA by PCR: NEGATIVE

## 2019-04-22 MED ORDER — SODIUM CHLORIDE 0.9 % IV SOLN
50.0000 ug/h | INTRAVENOUS | Status: AC
  Administered 2019-04-22 – 2019-04-23 (×3): 50 ug/h via INTRAVENOUS
  Filled 2019-04-22 (×3): qty 1

## 2019-04-22 MED ORDER — OCTREOTIDE LOAD VIA INFUSION
50.0000 ug | Freq: Once | INTRAVENOUS | Status: AC
  Administered 2019-04-22: 50 ug via INTRAVENOUS
  Filled 2019-04-22: qty 25

## 2019-04-22 MED ORDER — SODIUM CHLORIDE 0.9 % IV SOLN: INTRAVENOUS | Status: AC

## 2019-04-22 MED ORDER — TRAMADOL HCL 50 MG PO TABS
50.0000 mg | ORAL_TABLET | Freq: Four times a day (QID) | ORAL | Status: AC | PRN
  Administered 2019-04-22: 50 mg via ORAL
  Filled 2019-04-22: qty 1

## 2019-04-22 NOTE — Progress Notes (Signed)
   04/21/19 2145  Vitals  Temp 98.9 F (37.2 C)  Temp Source Oral  BP (!) 115/49  MAP (mmHg) (!) 64  BP Location Right Arm  BP Method Automatic  Patient Position (if appropriate) Lying  Pulse Rate 100  ECG Heart Rate 100  Resp 13  Oxygen Therapy  SpO2 100 %  O2 Device Room Air  MEWS Score  MEWS Temp 0  MEWS Systolic 0  MEWS Pulse 0  MEWS RR 1  MEWS LOC 0  MEWS Score 1  MEWS Score Color Green    The patient is admitted from ED with the diagnosis of GI bleed. A O X 4. Denied any acute pain at this moment. No respiratory distress noted. The patient is oriented to the staff, ascom/call bell. Will continue to monitor.

## 2019-04-22 NOTE — Progress Notes (Deleted)
The patient has had  large bright  red blood x 2. C/O chills, dizziness and about to pass out per his statement. BP=58/29. Called rapid Kerrie Pleasure and Sharlet Salina NP to come to the bedside and both are at the bedside right now. Stared NS  1 L bolus x 1. Blood transfusion running in rapid infusion per Danny's recommendation. Patient is A & O x 4.. New order to run each of the  3 bloods in an hour. Will do and continue to monitor.

## 2019-04-22 NOTE — Progress Notes (Signed)
3rd blood transfusion completed without any transfusion reaction.

## 2019-04-22 NOTE — Progress Notes (Addendum)
The patient has had  large bright  red blood x 2. C/O chills, dizziness and about to pass out per his statement. BP=58/29. Called rapid Kerrie Pleasure and Sharlet Salina NP to come to the bedside and both are at the bedside right now. Started NS  1 L bolus x 1. Blood transfusion running at rapid infusion per Danny's recommendation. Patient is A & O x 4.. New order to run each of the  3 bloods in an hour. Will do and continue to monitor.

## 2019-04-22 NOTE — Progress Notes (Addendum)
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 Repeat EGD on 1/12 with nonbleeding gastric ulcers and a duodenal ulcer with exposed vessels which were clipped. Patient continued to have bloody stools after EGD overnight.  Subjective: Overnight around 3 AM rapid response was called when patient become hypotensive after having a large bloody bowel movement.  3 units of packed RBCs were ordered immediately. He had another bloody bowel movement around 7 AM followed by 1 episode of hematemesis at 7:45 AM. Patient was feeling weak when seen this morning.  No more hematemesis or bloody bowel movement since earlier.  Assessment & Plan:   Principal Problem:   Acute GI bleeding Active Problems:   Acute blood loss anemia   Seizures (HCC)   Diabetes mellitus type 2 in nonobese (HCC)  Acute GI bleed.  And had repeat EGD done yesterday which shows exposed vessels in duodenum which were clipped. Continue to have GI bleed.  Received 3 units of packed RBCs overnight when he developed more bloody bowel movements around 3 AM followed by 1 episode of hematemesis.  Hemoglobin 9.4 after total of 5 units. -Call GI again as no follow-up notes-appreciated their recommendations. -Start him on octreotide because of his history  of cirrhosis. -Continue with normal saline at 250 mL/h for 8 hours as blood pressure was little soft. -Continue Protonix infusion. -Continue antibiotics because of his history of cirrhosis and continuation of bleeding. -Continue to monitor-transfuse below 8.  Seizure disorder.  No acute issue. -Continue with IV Keppra-we will switch back to home dose p.o. once GI bleed stabilizes.Marland Kitchen  AKI with CKD.  Recent hospitalization due to renal failure requiring temporary dialysis.  Making good urine.  Creatinine at 2.4 yesterday with baseline below 2. -Continue to monitor renal function. -Avoid nephrotoxic.  Leukocytosis.  Worsening leukocytosis, patient remained afebrile. May be stress response secondary to acute GI bleed. -Continue ceftriaxone. -Continue to monitor.  Objective: Vitals:   04/22/19 0600 04/22/19 0615 04/22/19 0630 04/22/19 0700  BP: (!) 89/50 (!) 89/51 (!) 96/57 101/62  Pulse: (!) 118 (!) 114 (!) 113 (!) 126  Resp: 18 17 17 18   Temp:    98.4 F (36.9 C)  TempSrc:    Oral  SpO2: 100% 100% 100% 100%  Weight:      Height:        Intake/Output Summary (Last 24 hours) at 04/22/2019 0718 Last data filed at 04/22/2019 [id number] Gross per 24 hour  Intake 2425 ml  Output 800 ml  Net 1625 ml   Filed Weights   04/22/19 0329  Weight: 59.4 kg    Examination:  General exam: Chronically ill-appearing gentleman, appears calm and comfortable  Respiratory system: Clear to auscultation. Respiratory effort normal. Cardiovascular system: S1 & S2 heard, RRR. No JVD, murmurs, rubs, gallops or clicks. Gastrointestinal system: Soft, nontender, nondistended, bowel sounds positive. Central nervous system: Alert and  oriented. No focal neurological deficits.Symmetric 5 x 5 power. Extremities: No edema, no cyanosis, pulses intact and symmetrical. Skin: No rashes, lesions or ulcers Psychiatry: Judgement and insight appear normal. Mood & affect appropriate.   DVT prophylaxis: SCDs Code Status:  Full Family Communication: No family at bedside Disposition Plan: Pending improvement.  Consultants:   Gastroenterology  Procedures:  EGD 04/21/2019  Antimicrobials:  Ceftriaxone  Data Reviewed: I have personally reviewed following labs and imaging studies  CBC: Recent Labs  Lab 04/17/19 0227 04/18/19 0858 04/19/19 0417 04/21/19 0207 04/21/19 1030 04/21/19 1850  WBC 12.8* 17.1* 14.6* 29.5*  --  16.7*  HGB 8.4* 9.3* 8.5* 7.7* 6.8* 6.2*  HCT 24.5* 27.3* 25.1* 24.2* 20.7* 19.3*  MCV 81.1 80.8 82.6 88.0  --  83.2  PLT 237 244 242 373  --  [id number]   Basic Metabolic Panel: Recent Labs  Lab 04/16/19 0426 04/17/19 0227 04/18/19 0858 04/19/19 0417 04/21/19 0207  NA 137 138 139 136 136  K 3.4* 3.4* 3.2* 3.4* 4.5  CL 106 106 106 109 110  CO2 19* 20* 21* 21* 13*  GLUCOSE 206* 129* 134* 131* 314*  BUN 84* 79* 59* 51* 65*  CREATININE 2.86* 2.49* 2.12* 1.97* 2.43*  CALCIUM 8.0* 7.7* 8.4* 8.0* 8.4*   GFR: Estimated Creatinine Clearance: 30.9 mL/min (A) (by C-G formula based on SCr of 2.43 mg/dL (H)). Liver Function Tests: Recent Labs  Lab 04/21/19 0207  AST 22  ALT 35  ALKPHOS 49  BILITOT 0.6  PROT 5.3*  ALBUMIN 2.9*   No results for input(s): LIPASE, AMYLASE in the last 168 hours. No results for input(s): AMMONIA in the last 168 hours. Coagulation Profile: Recent Labs  Lab 04/21/19 0210  INR 1.2   Cardiac Enzymes: No results for input(s): CKTOTAL, CKMB, CKMBINDEX, TROPONINI in the last 168 hours. BNP (last 3 results) No results for input(s): PROBNP in the last 8760 hours. HbA1C: No results for input(s): HGBA1C in the last 72 hours. CBG: Recent Labs  Lab 04/21/19 0812 04/21/19 1720 04/21/19 2006 04/21/19 2346 04/22/19 0527  GLUCAP 87 130* 117* 145* 202*   Lipid Profile: No results for input(s): CHOL, HDL, LDLCALC, TRIG, CHOLHDL, LDLDIRECT in the last 72 hours. Thyroid Function Tests: No results for input(s): TSH, T4TOTAL, FREET4, T3FREE, THYROIDAB in  the last 72 hours. Anemia Panel: No results for input(s): VITAMINB12, FOLATE, FERRITIN, TIBC, IRON, RETICCTPCT in the last 72 hours. Sepsis Labs: No results for input(s): PROCALCITON, LATICACIDVEN in the last 168 hours.  Recent Results (from the past 240 hour(s))  Culture, Urine     Status: Abnormal   Collection Time: 04/12/19  9:37 AM   Specimen: Urine, Catheterized  Result Value Ref Range Status   Specimen Description URINE, CATHETERIZED  Final   Special Requests   Final    NONE Performed at [facility], [address] [place], [place] [address]    Culture >=100,000 COLONIES/mL ESCHERICHIA COLI (A)  Final   Report Status 04/14/2019 FINAL  Final   Organism ID, Bacteria ESCHERICHIA COLI (A)  Final      Susceptibility   Escherichia coli - MIC*    AMPICILLIN <=2 SENSITIVE Sensitive     CEFAZOLIN <=4 SENSITIVE Sensitive     CEFTRIAXONE <=0.25 SENSITIVE Sensitive     CIPROFLOXACIN <=0.25 SENSITIVE Sensitive     GENTAMICIN <=1 SENSITIVE Sensitive     IMIPENEM <=0.25 SENSITIVE Sensitive     NITROFURANTOIN <=16 SENSITIVE Sensitive     TRIMETH/SULFA <=20 SENSITIVE  Sensitive     AMPICILLIN/SULBACTAM <=2 SENSITIVE Sensitive     PIP/TAZO <=4 SENSITIVE Sensitive     * >=100,000 COLONIES/mL ESCHERICHIA COLI  Respiratory Panel by RT PCR (Flu A&B, Covid) - Nasopharyngeal Swab     Status: None   Collection Time: 04/21/19  2:10 AM   Specimen: Nasopharyngeal Swab  Result Value Ref Range Status   SARS Coronavirus 2 by RT PCR NEGATIVE NEGATIVE Final    Comment: (NOTE) SARS-CoV-2 target nucleic acids are NOT DETECTED. The SARS-CoV-2 RNA is generally detectable in upper respiratoy specimens during the acute phase of infection. The lowest concentration of SARS-CoV-2 viral copies this assay can detect is 131 copies/mL. A negative result does not preclude SARS-Cov-2 infection and should not be used as the sole basis for treatment or other patient management decisions. A negative result may  occur with  improper specimen collection/handling, submission of specimen other than nasopharyngeal swab, presence of viral mutation(s) within the areas targeted by this assay, and inadequate number of viral copies (<131 copies/mL). A negative result must be combined with clinical observations, patient history, and epidemiological information. The expected result is Negative. Fact Sheet for Patients:  PinkCheek.be Fact Sheet for Healthcare Providers:  GravelBags.it This test is not yet ap proved or cleared by the [place] FDA and  has been authorized for detection and/or diagnosis of SARS-CoV-2 by FDA under an Emergency Use Authorization (EUA). This EUA will remain  in effect (meaning this test can be used) for the duration of the COVID-19 declaration under Section 564(b)(1) of the Act, 21 U.S.C. section 360bbb-3(b)(1), unless the authorization is terminated or revoked sooner.    Influenza A by PCR NEGATIVE NEGATIVE Final   Influenza B by PCR NEGATIVE NEGATIVE Final    Comment: (NOTE) The Xpert Xpress SARS-CoV-2/FLU/RSV assay is intended as an aid in  the diagnosis of influenza from Nasopharyngeal swab specimens and  should not be used as a sole basis for treatment. Nasal washings and  aspirates are unacceptable for Xpert Xpress SARS-CoV-2/FLU/RSV  testing. Fact Sheet for Patients: PinkCheek.be Fact Sheet for Healthcare Providers: GravelBags.it This test is not yet approved or cleared by the [place] FDA and  has been authorized for detection and/or diagnosis of SARS-CoV-2 by  FDA under an Emergency Use Authorization (EUA). This EUA will remain  in effect (meaning this test can be used) for the duration of the  Covid-19 declaration under Section 564(b)(1) of the Act, 21  U.S.C. section 360bbb-3(b)(1), unless the authorization is  terminated or  revoked. Performed at [facility], [place] [address] [place], [place] [address]   MRSA PCR Screening     Status: None   Collection Time: 04/21/19 11:51 PM   Specimen: Nasal Mucosa; Nasopharyngeal  Result Value Ref Range Status   MRSA by PCR NEGATIVE NEGATIVE Final    Comment:        The GeneXpert MRSA Assay (FDA approved for NASAL specimens only), is one component of a comprehensive MRSA colonization surveillance program. It is not intended to diagnose MRSA infection nor to guide or monitor treatment for MRSA infections. Performed at [facility], [place] [address] [place], [place] [address]      Radiology Studies: No results found.  Scheduled Meds: . Chlorhexidine Gluconate Cloth  6 each Topical Daily  . insulin aspart  0-9 Units Subcutaneous Q4H  . LORazepam  1 mg Intravenous Once  . [START ON 04/24/2019] pantoprazole  40 mg Intravenous Q12H   Continuous Infusions: .  cefTRIAXone (ROCEPHIN)  IV Stopped (04/21/19 1209)  . levETIRAcetam Stopped (04/21/19 2041)  . pantoprozole (PROTONIX) infusion 8 mg/hr (04/22/19 0148)     LOS: 1 day   Time spent: 45 minutes.  Lorella Nimrod, MD [facility] Pager [phone]  If 7PM-7AM, please contact night-coverage [email or web address] Password [facility] 04/22/2019, 7:18 AM   This record has been created using Dragon voice recognition software. Errors have been sought and corrected,but may not always be located. Such creation errors do not reflect on the standard of care.

## 2019-04-22 NOTE — Significant Event (Signed)
Rapid Response Event Note  Overview: Hypovolemic shock in acute blood loss  Initial Focused Assessment: Called by Baldo Ash regarding hypotensive pt with imminent emergency. M. Sharlet Salina APP was also notified and in route to the bedside. Upon my arrival, Mr. Ramsour was lethargic, but arousable to voice and oriented to person and situation.  CBG 145 reportedly. M. Sharlet Salina APPat bedside. Pt denied CP and SOB but did endorse abdominal "tightness". Abd soft and nontender. Skin pale, dry and cool to touch. Cap refill > 3 secs. HR 120s ST, BP 67/34 (42), RR 18 with sats 100% on RA. Pts Hgb was 6.2 at 1850.  An order was placed by M. Denny at [facility] for 3 units PRBCs to be given. No PRBCs were given. Pt had more bloody stools and became symptomatic. Blood delivered to bedside and given urgently. Pt was received from the ED at 2145 without any blood given for previous Hgb. Last transfusion was 1/12 and ended at 1430 (3 units).   Interventions: -No acute interventions by me  Plan of Care (if not transferred): -Urgently administer blood for resuscitation after initial 15 min check -Give all 3 units rapidly after each 15 min initial check. Give each unit in succession without interuption. Pt should receive blood continuously until all 3 units complete.  -recheck h/h post transfusion -Monitor for further bleeding   Event Summary: Call received 0114 Arrived 0115 Call ended 0145  Madelynn Done

## 2019-04-22 NOTE — Progress Notes (Signed)
First transfusion completed and the 2nd is  in progress. No adverse reaction noted. Will continue to monitor.

## 2019-04-22 NOTE — Progress Notes (Signed)
          Daily Rounding Note  04/22/2019, 12:48 PM  LOS: 1 day   SUBJECTIVE:   Chief complaint:     Reports of additional bloody stools, hematemesis. 3 AM: Hypotensive to 67/34.  HR to 120s Hgb went 6.8 >> 6.2 >> 3 PRBCs >> 9.4 at 1030 this AM.   5 PRBCs thus far.     Was not dizzy later this AM.  Denies recurrent nausea and no abdominal pain.    OBJECTIVE:         Vital signs in last 24 hours:    Temp:  [97.6 F (36.4 C)-98.9 F (37.2 C)] 98.4 F (36.9 C) (01/13 0700) Pulse Rate:  [95-129] 95 (01/13 1115) Resp:  [8-21] 14 (01/13 1115) BP: (58-134)/(29-81) 97/59 (01/13 1115) SpO2:  [97 %-100 %] 100 % (01/13 1115) Weight:  [59.4 kg] 59.4 kg (01/13 0329) Last BM Date: 04/22/19 Filed Weights   04/22/19 0329  Weight: 59.4 kg   General: looks chronically unwell but not acutely ill.   Alert.  Calm.  Dose not recall much about last night   Heart: RRR Chest: clear.  No dyspnea Abdomen: soft, NT, ND.  Active BS  Extremities: no CCE Neuro/Psych:  Oriented x 3.  Moves all 4s.    Intake/Output from previous day: 01/12 0701 - 01/13 0700 In: 2425 [I.V.:400; Blood:1575; IV Piggyback:450] Out: 800 [Urine:800]  Intake/Output this shift: Total I/O In: 240 [P.O.:240] Out: -   Lab Results: Recent Labs    04/21/19 0207 04/21/19 1030 04/21/19 1850 04/22/19 1025  WBC 29.5*  --  16.7* 29.8*  HGB 7.7* 6.8* 6.2* 9.4*  HCT 24.2* 20.7* 19.3* 26.5*  PLT 373  --  182 109*   BMET Recent Labs    04/21/19 0207  NA 136  K 4.5  CL 110  CO2 13*  GLUCOSE 314*  BUN 65*  CREATININE 2.43*  CALCIUM 8.4*   LFT Recent Labs    04/21/19 0207  PROT 5.3*  ALBUMIN 2.9*  AST 22  ALT 35  ALKPHOS 49  BILITOT 0.6   PT/INR Recent Labs    04/21/19 0210  LABPROT 15.2  INR 1.2   Hepatitis Panel No results for input(s): HEPBSAG, HCVAB, HEPAIGM, HEPBIGM in the last 72 hours.  Studies/Results: No results found.  ASSESMENT:   *    Current upper GI bleed. 04/17/2019 EGD with gastric and duodenal ulcers, epinephrine and Endo clipping of duodenal ulcer. 04/21/2019 EGD: Nonbleeding gastric ulcer without stigmata of bleeding.  Blood and duodenal blood aspirated, lavage.  2 duodenal ulcers 1 with visible vessel, 10 mm at largest diameter.  This was secured with 2 endoclips. Hematemesis, bloody stool and hemodynamic instability early this AM but none since.  Hematemesis may have been residual blood seen in stomach at EGD yesterday. Likewise maroon stool likely retained bleed from previous bleeding  *    Acute blood loss anemia.  Has received 5 PRBCs thus far. Hgb this AM looks good.     PLAN   *   Supportive care.  Observation of stools,  N/V and Hgb trend.  Not clear that Octreotide is necessary but will leave in place and stop after 24 hours.  Stopped Rocephin.   No plans to repeat EGD at present.      Azucena Freed  04/22/2019, 12:48 PM Phone [phone]

## 2019-04-22 NOTE — Progress Notes (Signed)
Patient complained of neck pain unrelieved with Tylenol 650 mg oral. Called Danny NP and received Tramadol 50 mg q6 hr. Tramadol  50 mg administered. Will continue to monitor.

## 2019-04-23 ENCOUNTER — Inpatient Hospital Stay (HOSPITAL_COMMUNITY): Payer: Medicaid Other | Admitting: Anesthesiology

## 2019-04-23 ENCOUNTER — Encounter (HOSPITAL_COMMUNITY)
Admission: EM | Discharge status: Against Medical Advice | Payer: Self-pay | Source: Home / Self Care | Attending: Internal Medicine

## 2019-04-23 ENCOUNTER — Encounter (HOSPITAL_COMMUNITY): Payer: Self-pay | Admitting: Internal Medicine

## 2019-04-23 DIAGNOSIS — K264 Chronic or unspecified duodenal ulcer with hemorrhage: Secondary | ICD-10-CM

## 2019-04-23 HISTORY — PX: ESOPHAGOGASTRODUODENOSCOPY (EGD) WITH PROPOFOL: SHX5813

## 2019-04-23 LAB — COMPREHENSIVE METABOLIC PANEL
ALT: 21 U/L (ref 0–44)
AST: 18 U/L (ref 15–41)
Albumin: 1.7 g/dL — ABNORMAL LOW (ref 3.5–5.0)
Alkaline Phosphatase: 27 U/L — ABNORMAL LOW (ref 38–126)
Anion gap: 5 (ref 5–15)
BUN: 40 mg/dL — ABNORMAL HIGH (ref 6–20)
CO2: 18 mmol/L — ABNORMAL LOW (ref 22–32)
Calcium: 7.4 mg/dL — ABNORMAL LOW (ref 8.9–10.3)
Chloride: 116 mmol/L — ABNORMAL HIGH (ref 98–111)
Creatinine, Ser: 1.84 mg/dL — ABNORMAL HIGH (ref 0.61–1.24)
GFR calc Af Amer: 49 mL/min — ABNORMAL LOW (ref >60)
GFR calc non Af Amer: 42 mL/min — ABNORMAL LOW (ref >60)
Glucose, Bld: 118 mg/dL — ABNORMAL HIGH (ref 70–99)
Potassium: 4.1 mmol/L (ref 3.5–5.1)
Sodium: 139 mmol/L (ref 135–145)
Total Bilirubin: 0.5 mg/dL (ref 0.3–1.2)
Total Protein: 3.1 g/dL — ABNORMAL LOW (ref 6.5–8.1)

## 2019-04-23 LAB — PREPARE RBC (CROSSMATCH)

## 2019-04-23 LAB — CBC
HCT: 15.8 % — ABNORMAL LOW (ref 39.0–52.0)
Hemoglobin: 5.6 g/dL — CL (ref 13.0–17.0)
MCH: 31.5 pg (ref 26.0–34.0)
MCHC: 35.4 g/dL (ref 30.0–36.0)
MCV: 88.8 fL (ref 80.0–100.0)
Platelets: 98 10*3/uL — ABNORMAL LOW (ref 150–400)
RBC: 1.78 MIL/uL — ABNORMAL LOW (ref 4.22–5.81)
RDW: 15.8 % — ABNORMAL HIGH (ref 11.5–15.5)
WBC: 16.7 10*3/uL — ABNORMAL HIGH (ref 4.0–10.5)
nRBC: 0.2 % (ref 0.0–0.2)

## 2019-04-23 LAB — GLUCOSE, CAPILLARY
Glucose-Capillary: 126 mg/dL — ABNORMAL HIGH (ref 70–99)
Glucose-Capillary: 99 mg/dL (ref 70–99)
Glucose-Capillary: 110 mg/dL — ABNORMAL HIGH (ref 70–99)
Glucose-Capillary: 113 mg/dL — ABNORMAL HIGH (ref 70–99)
Glucose-Capillary: 105 mg/dL — ABNORMAL HIGH (ref 70–99)

## 2019-04-23 LAB — HEMOGLOBIN AND HEMATOCRIT, BLOOD
HCT: 25.8 % — ABNORMAL LOW (ref 39.0–52.0)
Hemoglobin: 9 g/dL — ABNORMAL LOW (ref 13.0–17.0)

## 2019-04-23 SURGERY — ESOPHAGOGASTRODUODENOSCOPY (EGD) WITH PROPOFOL
Anesthesia: Monitor Anesthesia Care

## 2019-04-23 MED ORDER — PHENYLEPHRINE HCL-NACL 10-0.9 MG/250ML-% IV SOLN
INTRAVENOUS | Status: DC | PRN
  Administered 2019-04-23: 25 ug/min via INTRAVENOUS

## 2019-04-23 MED ORDER — PROPOFOL 10 MG/ML IV BOLUS
INTRAVENOUS | Status: DC | PRN
  Administered 2019-04-23: 30 mg via INTRAVENOUS

## 2019-04-23 MED ORDER — SODIUM CHLORIDE 0.9% IV SOLUTION: Freq: Once | INTRAVENOUS | Status: AC

## 2019-04-23 MED ORDER — SODIUM CHLORIDE 0.9 % IV SOLN: INTRAVENOUS | Status: DC | PRN

## 2019-04-23 MED ORDER — HYDROMORPHONE HCL 1 MG/ML IJ SOLN
0.5000 mg | Freq: Once | INTRAMUSCULAR | Status: AC
  Administered 2019-04-23: 0.5 mg via INTRAVENOUS
  Filled 2019-04-23: qty 1

## 2019-04-23 MED ORDER — SODIUM CHLORIDE 0.9 % IV SOLN
8.0000 mg/h | INTRAVENOUS | Status: DC
  Filled 2019-04-23: qty 80

## 2019-04-23 MED ORDER — LIDOCAINE HCL (CARDIAC) PF 100 MG/5ML IV SOSY
PREFILLED_SYRINGE | INTRAVENOUS | Status: DC | PRN
  Administered 2019-04-23: 50 mg via INTRAVENOUS

## 2019-04-23 MED ORDER — PROPOFOL 500 MG/50ML IV EMUL
INTRAVENOUS | Status: DC | PRN
  Administered 2019-04-23: 25 ug/kg/min via INTRAVENOUS

## 2019-04-23 SURGICAL SUPPLY — 15 items

## 2019-04-23 NOTE — Anesthesia Preprocedure Evaluation (Addendum)
Anesthesia Evaluation  Patient identified by MRN, date of birth, ID band Patient awake    Reviewed: Allergy & Precautions, NPO status , Patient's Chart, lab work & pertinent test results  Airway Mallampati: I       Dental no notable dental hx. (+) Teeth Intact   Pulmonary neg pulmonary ROS,    Pulmonary exam normal breath sounds clear to auscultation       Cardiovascular negative cardio ROS Normal cardiovascular exam Rhythm:Regular Rate:Normal     Neuro/Psych negative psych ROS   GI/Hepatic Neg liver ROS, PUD,   Endo/Other  diabetes, Type 2, Oral Hypoglycemic Agents  Renal/GU   negative genitourinary   Musculoskeletal negative musculoskeletal ROS (+)   Abdominal Normal abdominal exam  (+)   Peds  Hematology  (+) anemia ,   Anesthesia Other Findings   Reproductive/Obstetrics                             Anesthesia Physical Anesthesia Plan  ASA: III  Anesthesia Plan: MAC   Post-op Pain Management:    Induction:   PONV Risk Score and Plan: Propofol infusion  Airway Management Planned: Natural Airway and Mask  Additional Equipment: None  Intra-op Plan:   Post-operative Plan:   Informed Consent: I have reviewed the patients History and Physical, chart, labs and discussed the procedure including the risks, benefits and alternatives for the proposed anesthesia with the patient or authorized representative who has indicated his/her understanding and acceptance.       Plan Discussed with: CRNA  Anesthesia Plan Comments:        Anesthesia Quick Evaluation

## 2019-04-23 NOTE — Progress Notes (Signed)
Paged by nurse that patient is very agitated and threatening to leave AMA. Saw patient in the room, he was accompanied by his sister who also seems frustrated with his behavior. To patient he does not want to die here hungry and he wants to eat. Patient was n.p.o. because of ongoing GI bleed. Did not had any emesis or bowel movement since this morning. Explained him the situation but he does not want to listen at this time.  -Start him on clear liquid. -Get some ice cream. -Give him Ativan 1 mg x 1. -If patient does not want to stay-have to sign AMA as he is critically ill with ongoing GI bleed and very high risk of dying.

## 2019-04-23 NOTE — Progress Notes (Signed)
Pt reported to pt that he had been starving and not ate for 2 weeks. He wanted to speak with MD about advancing his diet or he was going to leave AMA. Amin MD spoke with pt and placed orders for a clear liquid diet with exception to include ice cream. When RN brought menu in to pt's room his sister [name] was present. When RN reviewed diet compromise with pt he stated that was not good enough, that he'd rather leave, and die of complications. RN reported findings to MD. Reesa Chew MD offered to do a one time regular diet meal in order to keep pt at hospital due to the risk of death pt would face by going home at this point. Pt again stated that was not good enough for him and he was ready to go home. RN had pt sign AMA form after conversation with RN and MD (via phone) about his goals of care.   Later, pt's sister asked if MD could sign a DNR order for pt to take home with him. RN reviewed with pt his wishes and what a DNR would translate to - no CPR, no interventions if he were to bleed out. Pt stated these are his wishes. RN contacted Reesa Chew MD she stated she would not be able to sign form due to not being in hospital at that moment, but that if pt would wait for night shift, maybe night shift MD would sign. Pt and sister were already making plans to leave. RN ran pt's situation by Lanney Gins. He stated that if pt wished to be a DNR to let his family know to pursue hospice care at home if so wished to have form. RN updated pt and sister [name]) - they verbalized understanding. [name] replayed a recording of RN asking pt if he wanted to be DNR and him stating he did not want CPR and/or interventions. RN explained that recording medical staff without permission is not allowed. She verbalized understanding.   RN removed pt's peripheral IVs. Family brought clothing for him to change into. Pt walked off unit independently to leave in family private vehicle.

## 2019-04-23 NOTE — Progress Notes (Signed)
CRITICAL VALUE ALERT  Critical Value:  hbg = 5.6  Date & Time Notied:  1/14/212 & 0740  Provider Notified: Lorella Nimrod MD  Orders Received/Actions taken: 2 RBC units ordered, follow up H& H with another 2 units of blood after follow up. GI consult called and notified of hgb drop along w/ pt severe generalized pain. Pt made NPO. RN will continue to monitor pt.

## 2019-04-23 NOTE — Progress Notes (Signed)
Blood in stool overnight C/o abdominal pain  BP 89/49.  HR in 60s.    Hgb 7.6 >> 5.6.   Platelets 98 K BUN actually decreased: 60 >> 40.   Scheduled Meds: . sodium chloride   Intravenous Once  . Chlorhexidine Gluconate Cloth  6 each Topical Daily  . insulin aspart  0-9 Units Subcutaneous Q4H  . LORazepam  1 mg Intravenous Once  . [START ON 04/24/2019] pantoprazole  40 mg Intravenous Q12H   Continuous Infusions: . levETIRAcetam 500 mg (04/23/19 [id number])  . octreotide  (SANDOSTATIN)    IV infusion 50 mcg/hr (04/22/19 2132)  . pantoprozole (PROTONIX) infusion 8 mg/hr (04/22/19 2300)   PRN Meds:.acetaminophen **OR** acetaminophen, ondansetron **OR** ondansetron (ZOFRAN) IV    GI Bleed seems to be ongoing so arranging for EGD today.  2 PRBCs ordered, not yet started.  Pt is NPO.  Continues on Protonix drip (thru Friday 0300) and octreotide (thr 10:30 this AM)

## 2019-04-23 NOTE — Transfer of Care (Signed)
Immediate Anesthesia Transfer of Care Note  Patient: [name]  Procedure(s) Performed: ESOPHAGOGASTRODUODENOSCOPY (EGD) WITH PROPOFOL (N/A )  Patient Location: Endoscopy Unit  Anesthesia Type:MAC  Level of Consciousness: drowsy  Airway & Oxygen Therapy: Patient Spontanous Breathing and Patient connected to nasal cannula oxygen  Post-op Assessment: Report given to RN and Post -op Vital signs reviewed and stable  Post vital signs: Reviewed and stable  Last Vitals:  Vitals Value Taken Time  BP    Temp    Pulse    Resp    SpO2      Last Pain:  Vitals:   04/23/19 1114  TempSrc: Tympanic  PainSc:       Patients Stated Pain Goal: 3 (55/97/41 6384)  Complications: No apparent anesthesia complications

## 2019-04-23 NOTE — Progress Notes (Signed)
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 Repeat EGD on 1/12 with nonbleeding gastric ulcers and a duodenal ulcer with exposed vessels which were clipped. Patient continued to have bloody stools after EGD overnight.  Subjective: Patient continued to have bloody bowel movements.  Feeling very weak and lethargic this morning.  Complaining of generalized body ache and weakness.  Assessment & Plan:   Principal Problem:   Acute GI bleeding Active Problems:   Acute blood loss anemia   Seizures (HCC)   Diabetes mellitus type 2 in nonobese (HCC)  Acute GI bleed.   Continue to have GI bleed.  Hemoglobin dropped to 5.6 this morning after improvement to 9.4 with 5 units.  He was taken for a repeat EGD which was negative for any ongoing bleeding.  3 vessels were clipped in duodenum yesterday. -GI is following-appreciate their recommendations. GI consulted IR for empiric GDA embolization, plan is to do a stat nuclear tagged red cell scan if another episode of hematochezia and then proceed with embolization accordingly. -2 more units of packed RBCs ordered-with 2 more on hold. -Continue octreotide for 48 hours total. -Continue Protonix infusion. -Continue antibiotics because of his history of cirrhosis and  continuation of bleeding. -Continue to monitor-transfuse below 8.  Seizure disorder.  No acute issue. -Continue with IV Keppra-we will switch back to home dose p.o. once GI bleed stabilizes.Marland Kitchen  AKI with CKD.  Recent hospitalization due to renal failure requiring temporary dialysis.  Making good urine.  Creatinine at 1.84 with baseline below 2.  Seems stable at this time. -Continue to monitor renal function. -Avoid nephrotoxic.  Leukocytosis.  Improving, patient remained afebrile. May be stress response secondary to acute GI bleed. -Continue ceftriaxone. -Continue to monitor.  Objective: Vitals:   04/23/19 1230 04/23/19 1240 04/23/19 1250 04/23/19 1300  BP: (!) 93/53 94/61 (!) 94/52 (!) 105/59  Pulse: (!) 56 60 (!) 57 65  Resp: 10 13 (!) 9 10  Temp:    98.1 F (36.7 C)  TempSrc:    Temporal  SpO2: 100% 99% 100% 100%  Weight:      Height:        Intake/Output Summary (Last 24 hours) at 04/23/2019 1344 Last data filed at 04/23/2019 1245 Gross per 24 hour  Intake 880 ml  Output 2950 ml  Net -2070 ml   Filed Weights   04/22/19 0329 04/23/19 0500  Weight: 59.4 kg 61.6 kg    Examination:  General exam: Chronically ill-appearing gentleman, appears very lethargic. Respiratory system: Clear to auscultation. Respiratory effort normal. Cardiovascular system: S1 & S2 heard, RRR. No JVD, murmurs, rubs, gallops or clicks. Gastrointestinal system: Soft, nontender, nondistended, bowel sounds positive. Central nervous system: Lethargic and drowsy, easily arousable and oriented, no focal neurological deficits. Extremities: No edema, no cyanosis, pulses intact and symmetrical. Skin: No rashes, lesions or ulcers Psychiatry: Judgement and insight appear  normal. Mood & affect appropriate.   DVT prophylaxis: SCDs Code Status: Full Family Communication: No family at bedside Disposition Plan: Pending improvement.  Consultants:   Gastroenterology  IR  Procedures:  EGD  04/21/2019  Antimicrobials:  Ceftriaxone  Data Reviewed: I have personally reviewed following labs and imaging studies  CBC: Recent Labs  Lab 04/21/19 1850 04/22/19 1025 04/22/19 1312 04/22/19 1643 04/23/19 0645  WBC 16.7* 29.8* 24.9* 27.7* 16.7*  HGB 6.2* 9.4* 7.8* 7.6* 5.6*  HCT 19.3* 26.5* 22.6* 21.2* 15.8*  MCV 83.2 87.7 88.3 87.2 88.8  PLT 182 109* 74* 100* 98*   Basic Metabolic Panel: Recent Labs  Lab 04/18/19 0858 04/19/19 0417 04/21/19 0207 04/22/19 1312 04/23/19 0645  NA 139 136 136 136 139  K 3.2* 3.4* 4.5 4.9 4.1  CL 106 109 110 116* 116*  CO2 21* 21* 13* 14* 18*  GLUCOSE 134* 131* 314* 158* 118*  BUN 59* 51* 65* 60* 40*  CREATININE 2.12* 1.97* 2.43* 1.81* 1.84*  CALCIUM 8.4* 8.0* 8.4* 7.1* 7.4*   GFR: Estimated Creatinine Clearance: 42.3 mL/min (A) (by C-G formula based on SCr of 1.84 mg/dL (H)). Liver Function Tests: Recent Labs  Lab 04/21/19 0207 04/23/19 0645  AST 22 18  ALT 35 21  ALKPHOS 49 27*  BILITOT 0.6 0.5  PROT 5.3* 3.1*  ALBUMIN 2.9* 1.7*   No results for input(s): LIPASE, AMYLASE in the last 168 hours. No results for input(s): AMMONIA in the last 168 hours. Coagulation Profile: Recent Labs  Lab 04/21/19 0210  INR 1.2   Cardiac Enzymes: No results for input(s): CKTOTAL, CKMB, CKMBINDEX, TROPONINI in the last 168 hours. BNP (last 3 results) No results for input(s): PROBNP in the last 8760 hours. HbA1C: No results for input(s): HGBA1C in the last 72 hours. CBG: Recent Labs  Lab 04/22/19 2117 04/23/19 0106 04/23/19 0422 04/23/19 0745 04/23/19 1025  GLUCAP 139* 126* 99 110* 113*   Lipid Profile: No results for input(s): CHOL, HDL, LDLCALC, TRIG, CHOLHDL, LDLDIRECT in the last 72 hours. Thyroid Function Tests: No results for input(s): TSH, T4TOTAL, FREET4, T3FREE, THYROIDAB in the last 72 hours. Anemia Panel: No results for input(s): VITAMINB12, FOLATE, FERRITIN, TIBC, IRON, RETICCTPCT in the last 72 hours. Sepsis  Labs: No results for input(s): PROCALCITON, LATICACIDVEN in the last 168 hours.  Recent Results (from the past 240 hour(s))  Respiratory Panel by RT PCR (Flu A&B, Covid) - Nasopharyngeal Swab     Status: None   Collection Time: 04/21/19  2:10 AM   Specimen: Nasopharyngeal Swab  Result Value Ref Range Status   SARS Coronavirus 2 by RT PCR NEGATIVE NEGATIVE Final    Comment: (NOTE) SARS-CoV-2 target nucleic acids are NOT DETECTED. The SARS-CoV-2 RNA is generally detectable in upper respiratoy specimens during the acute phase of infection. The lowest concentration of SARS-CoV-2 viral copies this assay can detect is 131 copies/mL. A negative result does not preclude SARS-Cov-2 infection and should not be used as the sole basis for treatment or other patient management decisions. A negative result may occur with  improper specimen collection/handling, submission of specimen other than nasopharyngeal swab, presence of viral mutation(s) within the areas targeted by this assay, and inadequate number of viral copies (<131 copies/mL). A negative result must be combined with clinical observations, patient history, and epidemiological information. The expected result is Negative. Fact Sheet for Patients:  PinkCheek.be Fact Sheet for Healthcare Providers:  GravelBags.it This test is not yet ap proved or cleared by the [place]  FDA and  has been authorized for detection and/or diagnosis of SARS-CoV-2 by FDA under an Emergency Use Authorization (EUA). This EUA will remain  in effect (meaning this test can be used) for the duration of the COVID-19 declaration under Section 564(b)(1) of the Act, 21 U.S.C. section 360bbb-3(b)(1), unless the authorization is terminated or revoked sooner.    Influenza A by PCR NEGATIVE NEGATIVE Final   Influenza B by PCR NEGATIVE NEGATIVE Final    Comment: (NOTE) The Xpert Xpress SARS-CoV-2/FLU/RSV assay  is intended as an aid in  the diagnosis of influenza from Nasopharyngeal swab specimens and  should not be used as a sole basis for treatment. Nasal washings and  aspirates are unacceptable for Xpert Xpress SARS-CoV-2/FLU/RSV  testing. Fact Sheet for Patients: PinkCheek.be Fact Sheet for Healthcare Providers: GravelBags.it This test is not yet approved or cleared by the [place] FDA and  has been authorized for detection and/or diagnosis of SARS-CoV-2 by  FDA under an Emergency Use Authorization (EUA). This EUA will remain  in effect (meaning this test can be used) for the duration of the  Covid-19 declaration under Section 564(b)(1) of the Act, 21  U.S.C. section 360bbb-3(b)(1), unless the authorization is  terminated or revoked. Performed at [facility], [place] [address], [place], [place] [address]   MRSA PCR Screening     Status: None   Collection Time: 04/21/19 11:51 PM   Specimen: Nasal Mucosa; Nasopharyngeal  Result Value Ref Range Status   MRSA by PCR NEGATIVE NEGATIVE Final    Comment:        The GeneXpert MRSA Assay (FDA approved for NASAL specimens only), is one component of a comprehensive MRSA colonization surveillance program. It is not intended to diagnose MRSA infection nor to guide or monitor treatment for MRSA infections. Performed at [facility], [place] [address], [place], [place] [address]      Radiology Studies: No results found.  Scheduled Meds: . Chlorhexidine Gluconate Cloth  6 each Topical Daily  . insulin aspart  0-9 Units Subcutaneous Q4H  . LORazepam  1 mg Intravenous Once  . [START ON 04/24/2019] pantoprazole  40 mg Intravenous Q12H   Continuous Infusions: . levETIRAcetam 500 mg (04/23/19 [id number])  . pantoprozole (PROTONIX) infusion       LOS: 2 days   Time spent: 45 minutes.  Lorella Nimrod, MD [facility] Pager [phone]  If 7PM-7AM, please contact  night-coverage [email or web address] Password [facility] 04/23/2019, 1:44 PM   This record has been created using Systems analyst. Errors have been sought and corrected,but may not always be located. Such creation errors do not reflect on the standard of care.

## 2019-04-23 NOTE — Progress Notes (Signed)
Repeat upper endoscopy performed for hematochezia/recurrent bleeding. Findings today a bit surprising given that there was no evidence of active or recent bleeding in the stomach or duodenum.  Duodenal ulcers are still present, 3 hemoclips remain in place.  I have discussed the case with interventional radiology, Dr. Annamaria Boots.  We discussed empiric GDA embolization given known duodenal ulcers versus tagged red cell scan to rule out other bleeding source given the unrevealing EGD today.  After discussion, current plan: --Return to the floor with close monitoring of hemoglobin after transfusions --Continue PPI infusion --N.p.o. with sips with meds and ice chips --If recurrent hematochezia or relative hemodynamic instability then proceed with a stat nuclear tagged red cell scan --IR will need to be notified that tagged RBC scan is in process

## 2019-04-23 NOTE — Op Note (Signed)
[facility] Patient Name: [name] Procedure Date : 04/23/2019 MRN: [id number] Attending MD: Jerene Bears , MD Date of Birth: [birth year] CSN: [id number] Age: 49 Admit Type: Inpatient Procedure:                Upper GI endoscopy Indications:              Hematochezia, Acute duodenal ulcer with hemorrhage                            with EGD 04/16/2018, 04/20/2018 and now evidence of                            recurrent GI bleeding Providers:                Lajuan Lines. Hilarie Fredrickson, MD, Angus Seller, William Dalton,                            Technician, Theodora Blow, Technician Referring MD:             [facility] Medicines:                Monitored Anesthesia Care Complications:            No immediate complications. Estimated Blood Loss:     Estimated blood loss: none. Procedure:                Pre-Anesthesia Assessment:                           - Prior to the procedure, a History and Physical                            was performed, and patient medications and                            allergies were reviewed. The patient's tolerance of                            previous anesthesia was also reviewed. The risks                            and benefits of the procedure and the sedation                            options and risks were discussed with the patient.                            All questions were answered, and informed consent                            was obtained. Prior Anticoagulants: The patient has                            taken no previous anticoagulant or antiplatelet  agents. ASA Grade Assessment: III - A patient with                            severe systemic disease. After reviewing the risks                            and benefits, the patient was deemed in                            satisfactory condition to undergo the procedure.                           After obtaining informed consent, the endoscope was                        passed under direct vision. Throughout the                            procedure, the patient's blood pressure, pulse, and                            oxygen saturations were monitored continuously. The                            [id number]) Olympus gastroscope was                            introduced through the mouth, and advanced to the                            second part of duodenum. The upper GI endoscopy was                            accomplished without difficulty. The patient                            tolerated the procedure well. Scope In: Scope Out: Findings:      The examined esophagus was normal.      Moderate inflammation characterized by erosions was found in the       prepyloric region of the stomach.      The exam of the stomach was otherwise normal. No fresh or old blood in       the stomach.      Two non-bleeding cratered duodenal ulcers with no stigmata of bleeding       were found in the duodenal bulb. The largest lesion was 10 mm in largest       dimension. There was no fresh or old blood in the examined duodenum. The       more distal ulcer (which has been previously treated) contains 3       previously placed endoclips which remain intact.      The second portion of the duodenum and examined third portion of the       duodenum were normal. Impression:               - Normal esophagus.                           -  Gastritis (nonbleeding, and Clo test negative on                            04/16/2018).                           - Duodenal ulcers with no active bleeding now. Even                            with these findings, this remains the most likely                            source of recent bleeding.                           - Normal second portion of the duodenum and third                            portion of the duodenum.                           - No specimens collected. Moderate Sedation:      N/A Recommendation:            - Return patient to hospital ward for ongoing care.                           - NPO, except for sips with medications.                           - Continue present medications including PPI gtt.                           - If active rebleeding then IR consultation is                            recommended for consideration of angiography and                            GDA embolization. Procedure Code(s):        --- Professional ---                           [phone], Esophagogastroduodenoscopy, flexible,                            transoral; diagnostic, including collection of                            specimen(s) by brushing or washing, when performed                            (separate procedure) Diagnosis Code(s):        --- Professional ---                           K29.70, Gastritis, unspecified,  without bleeding                           K26.9, Duodenal ulcer, unspecified as acute or                            chronic, without hemorrhage or perforation                           K92.1, Melena (includes Hematochezia)                           K26.0, Acute duodenal ulcer with hemorrhage CPT copyright 2019 [employer]. All rights reserved. The codes documented in this report are preliminary and upon coder review may  be revised to meet current compliance requirements. Jerene Bears, MD 04/23/2019 12:45:24 PM This report has been signed electronically. Number of Addenda: 0

## 2019-04-24 LAB — TYPE AND SCREEN
ABO/RH(D): A POS
Antibody Screen: NEGATIVE
Unit division: 0
Unit division: 0
Unit division: 0
Unit division: 0
Unit division: 0
Unit division: 0
Unit division: 0
Unit division: 0
Unit division: 0

## 2019-04-24 LAB — BPAM RBC
Blood Product Expiration Date: 202101152359
Blood Product Expiration Date: 202101302359
Blood Product Expiration Date: 202101302359
Blood Product Expiration Date: 202102012359
Blood Product Expiration Date: 202102032359
Blood Product Expiration Date: 202102032359
Blood Product Expiration Date: 202102032359
Blood Product Expiration Date: 202102062359
Blood Product Expiration Date: 202102062359
ISSUE DATE / TIME: 202101091959
ISSUE DATE / TIME: 202101091959
ISSUE DATE / TIME: 202101120400
ISSUE DATE / TIME: 202101121205
ISSUE DATE / TIME: 202101130112
ISSUE DATE / TIME: 202101130253
ISSUE DATE / TIME: 202101130501
ISSUE DATE / TIME: 202101140848
ISSUE DATE / TIME: 202101141037
Unit Type and Rh: 600
Unit Type and Rh: 6200
Unit Type and Rh: 6200
Unit Type and Rh: 6200
Unit Type and Rh: 6200
Unit Type and Rh: 6200
Unit Type and Rh: 6200
Unit Type and Rh: 6200
Unit Type and Rh: 6200

## 2019-04-24 NOTE — Discharge Summary (Signed)
Physician Discharge Summary  [name] [id number] DOB: [birth year] DOA: 04/21/2019  PCP: Patient, No Pcp Per  Admit date: 04/21/2019 Discharge date: 04/24/2019  Admitted From: Home Disposition: Patient left AMA.  Recommendations for Outpatient Follow-up:  1. Follow up with PCP in 1-2 weeks 2. Please obtain BMP/CBC in one week 3. 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 Repeat EGD on 1/12 with nonbleeding gastric ulcers and a duodenal ulcer with exposed vessels which were clipped. Patient continued to have bloody stools after EGD overnight. Patient had a repeat EGD which was negative for any active bleeding. GI consulted IR for empiric GDA embolization, plan is to do a stat nuclear tagged red cell scan if another episode of hematochezia and then proceed with embolization accordingly. His was given total of 7 units of packed RBCs.  His hemoglobin was 9 posttransfusion. Was also treated with IV Protonix infusion and octreotide which were continue at the time of leaving AMA.  He did not had any more hematochezia or hematemesis during the day.  Around evening time he became very irritable stating that he cannot stay without food and wants to go home.  He decided to leave AMA despite telling him that he is in danger of dying because of this bleeding.   We did offer him food and explained to him that why we cannot give him regular food as we are anticipating a procedure if needed.  Patient was very rude and did not listen to me, nurse or his sisters. He continued to state that he is an adult and does not want to come back to hospital even if he bleeds to death.  AMA paper were signed before he left.  Discharge Diagnoses:  Principal Problem:   Acute GI bleeding Active Problems:   Acute blood loss anemia   Seizures (HCC)   Diabetes mellitus type 2 in nonobese [facility])   Discharge Instructions   Allergies as of 04/23/2019      Reactions   Penicillins Hives, Other (See Comments)   Childhood allergy  Did it involve swelling of the face/tongue/throat, SOB, or low BP? Unknown Did it involve sudden or severe rash/hives, skin peeling, or any reaction on the inside of your mouth or nose? Unknown Did you need to seek medical attention at a hospital or doctor's office? Unknown When did it last happen?Pt was a child If all above answers are "NO", may proceed with cephalosporin use. Sister states he just breaks out in hives unsure about other reactions.    Sulfur Hives, Rash      Medication List    ASK your doctor about these medications   acetaminophen 500 MG tablet Commonly known as: TYLENOL Take 1 tablet (500 mg total) by mouth every 6 (six) hours as needed for mild pain, moderate pain, fever or headache.   fexofenadine 180 MG tablet Commonly known as: ALLEGRA Take 180 mg by mouth daily as needed for allergies or  rhinitis.   glimepiride 2 MG tablet Commonly known as: Amaryl Take 1 tablet (2 mg total) by mouth every morning.   levETIRAcetam 500 MG tablet Commonly known as: KEPPRA Take 1 tablet (500 mg total) by mouth 2 (two) times daily.   pantoprazole 40 MG tablet Commonly known as: PROTONIX Take 1 tablet (40 mg total) by mouth 2 (two) times daily.       Allergies  Allergen Reactions  . Penicillins Hives and Other (See  Comments)    Childhood allergy  Did it involve swelling of the face/tongue/throat, SOB, or low BP? Unknown Did it involve sudden or severe rash/hives, skin peeling, or any reaction on the inside of your mouth or nose? Unknown Did you need to seek medical attention at a hospital or doctor's office? Unknown When did it last happen?Pt was a child If all above answers are "NO", may proceed with cephalosporin use. Sister states he just breaks out in hives unsure about other reactions.    . Sulfur Hives and Rash    Consultations:  GI   IR  Procedures/Studies: EGD. EEG  Result Date: 04/07/2019 Lora Havens, MD     04/07/2019  1:53 PM Patient Name: [name] MRN: [id number] Epilepsy Attending: Lora Havens Referring Physician/Provider: Dr. Cristal Ford Date: 04/07/2019 Duration: 26.30 minutes Patient history: 49 year old male with prior history of seizure disorder who presented with altered mental status.  EEG to evaluate for seizures. Level of alertness: awake AEDs during EEG study: None Technical aspects: This EEG study was done with scalp electrodes positioned according to the 10-20 International system of electrode placement. Electrical activity was acquired at a sampling rate of 500Hz  and reviewed with a high frequency filter of 70Hz  and a low frequency filter of 1Hz . EEG data were recorded continuously and digitally stored. Description: During awake state, no clear posterior dominant rhythm was seen.  EEG showed continuous rhythmic 2 to 3 Hz delta activity in right temporoparietal region, which at times appeared sharply contoured.  There was also continuous low amplitude 2 to 5 Hz theta - delta slowing as well as intermittent rhythmic 2 to 3 Hz delta slowing seen in left hemisphere.  Physiologic photic driving was not seen during photic stimulation.  No EEG change was seen during hyperventilation other than continuous right temporoparietal delta slowing. Abnormality -Continuous  rhythmic delta slowing, right temporoparietal region -Continuous slow, left hemisphere IMPRESSION: This study is suggestive of cortical dysfunction in the right temporoparietal region, likely secondary to underlying abnormality.  There is also evidence of cortical dysfunction in left hemisphere, nonspecific to etiology.  No seizures or definite epileptiform discharges were seen throughout the recording. However, only wakefulness was recorded. If suspicion for interictal activity remains a concern, a prolonged study including sleep should be considered.   CT HEAD WO CONTRAST  Result Date: 04/09/2019 CLINICAL DATA:  Cephalopathy EXAM: CT HEAD WITHOUT CONTRAST TECHNIQUE: Contiguous axial images were obtained from the base of the skull through the vertex without intravenous contrast. COMPARISON:  04/05/2019 FINDINGS: Brain: No evidence of acute infarction, hemorrhage, hydrocephalus, extra-axial collection or mass lesion/mass effect. Vascular: No hyperdense vessel or unexpected calcification. Skull: Normal. Negative for fracture or focal lesion. Sinuses/Orbits: No acute finding. Other: None. IMPRESSION: No acute intracranial abnormality is noted. The overall appearance is similar to that seen on the prior exam. Electronically Signed   By: Inez Catalina M.D.   On: 04/09/2019 14:42   CT Head Wo Contrast  Result Date: 04/05/2019 CLINICAL DATA:  Altered mental  status. EXAM: CT HEAD WITHOUT CONTRAST TECHNIQUE: Contiguous axial images were obtained from the base of the skull through the vertex without intravenous contrast. COMPARISON:  09/08/2007 FINDINGS: Brain: There is no evidence for acute hemorrhage, hydrocephalus, mass lesion, or abnormal extra-axial fluid collection. No definite CT evidence for acute infarction. Vascular: No hyperdense vessel or unexpected calcification. Skull: No evidence for fracture. No worrisome lytic or sclerotic lesion. Sinuses/Orbits: The visualized paranasal sinuses and mastoid air  cells are clear. Visualized portions of the globes and intraorbital fat are unremarkable. Other: None. IMPRESSION: 1. Unremarkable CT head.  No acute intracranial abnormality. Electronically Signed   By: Misty Stanley M.D.   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 Additional history provided: Patient admitted with metabolic encephalopathy in the setting of acute kidney injury, uremia, rhabdomyolysis. EXAM: MRI HEAD WITHOUT CONTRAST TECHNIQUE: Multiplanar, multiecho pulse sequences of the brain and surrounding structures were obtained without intravenous contrast. COMPARISON:  Head CT 04/09/2019, brain MRI 04/07/2019 FINDINGS: Brain: Diffusion-weighted and T2/FLAIR hyperintense signal within the medial left temporal lobe and posteromedial left thalamus persists but has become somewhat less conspicuous. New from prior MRI 04/07/2019, there is fairly extensive subcortical greater than cortical T2/FLAIR hyperintense signal abnormality within the bilateral frontoparietal lobes, occipital lobes and posterior temporal lobes. There is also patchy cortical restricted diffusion throughout these territories suspicious for acute infarcts/early subacute. Additionally, there is new T2/FLAIR hyperintense signal abnormality within the bilateral cerebellum. Patchy restricted diffusion also present within the bilateral cerebellar hemispheres suspicious for acute/early subacute infarcts. A 3 mm nonspecific focus of T2/FLAIR hyperintensity within the left globus pallidus was present on prior MRI, although seen to better advantage on today's study. No midline shift or extra-axial fluid collection. No chronic intracranial blood products. Cerebral volume is normal for age. Vascular: Flow voids maintained within the proximal large arterial vessels. Skull and upper cervical spine: No focal marrow lesion Sinuses/Orbits: Visualized  orbits demonstrate no acute abnormality. Mild ethmoid sinus mucosal thickening. Small maxillary sinus mucous retention cysts. No significant mastoid effusion. These results were called by telephone at the time of interpretation on 04/15/2019 at 1:29 pm to provider [facility] , who verbally acknowledged these results. IMPRESSION: New from prior MRI 04/07/2019, there is fairly extensive subcortical greater than cortical signal abnormality within the bilateral frontoparietal lobes, occipital lobes and posterior temporal lobes. Patchy cortical restricted diffusion in these territories suspicious for acute/early subacute infarcts. There is also new signal abnormality within the bilateral cerebellum as well as patchy restricted diffusion which is suspicious for acute/early subacute infarcts. 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 Diffusion-weighted and T2 hyperintense signal abnormality within the medial left temporal lobe and posteromedial left thalamus persists, but is somewhat less conspicuous. Differential considerations are unchanged and may reflect seizure related changes, HSV encephalitis or autoimmune encephalitis. Continued MRI follow-up is recommended to ensure resolution of signal abnormality at these sites. Electronically Signed   By: Kellie Simmering DO   On: 04/15/2019 13:33   MR BRAIN WO CONTRAST  Addendum Date: 04/07/2019   ADDENDUM REPORT: 04/07/2019 13:48 ADDENDUM: These results were called by telephone at the time of interpretation on 04/07/2019 at 1:48 pm to provider [facility] , who verbally acknowledged these results. Electronically Signed   By: Kellie Simmering DO   On: 04/07/2019 13:48   Result Date: 04/07/2019 CLINICAL DATA:  Seizure, chronic, history  of trauma. Additional history provided: Altered mental status EXAM: MRI HEAD WITHOUT CONTRAST TECHNIQUE: Multiplanar, multiecho pulse  sequences of the brain and surrounding structures were obtained without intravenous contrast. COMPARISON:  Head CT 04/05/2019, brain MRI 04/07/2007 FINDINGS: Brain: Intermittently motion degraded examination. There is diffusion weighted and T2/FLAIR hyperintensity within the medial temporal lobe/hippocampus as well as posteromedial left thalamus. There is mild associated parenchymal swelling. There is a small amount of SWI signal loss along the right parietal lobe likely reflecting chronic blood products (series 7, image 81). No focal parenchymal signal abnormality elsewhere within the brain. No midline shift or extra-axial fluid collection. Cerebral volume is normal for age. Vascular: Flow voids maintained within the proximal large arterial vessels. Skull and upper cervical spine: No focal marrow lesion Sinuses/Orbits: Visualized orbits demonstrate no acute abnormality. Mild scattered paranasal sinus mucosal thickening. Right maxillary sinus mucous retention cysts. No significant mastoid effusion. IMPRESSION: Diffusion-weighted and T2/FLAIR hyperintense signal abnormality within the medial temporal lobe/hippocampus as well as posteromedial left thalamus. Mild associated parenchymal swelling. 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 MRI follow-up is recommended following seizure control to ensure resolution of signal abnormality and to exclude alternative etiologies (i.e. neoplasm) Chronic blood products along the right parietal lobe, possibly posttraumatic. Electronically Signed: By: Kellie Simmering DO On: 04/07/2019 13:35   US Abdomen Complete  Result Date: 04/05/2019 CLINICAL DATA:  49 year old male with acute renal failure. EXAM: ABDOMEN ULTRASOUND COMPLETE COMPARISON:  CT of the abdomen pelvis dated 11/20/2017. FINDINGS: Gallbladder: No gallstones or wall thickening visualized. No sonographic Murphy sign noted by sonographer. Common  bile duct: Diameter: 5 mm Liver: There is diffuse increased liver echogenicity most commonly seen in the setting of fatty infiltration. Superimposed inflammation or fibrosis is not excluded. Clinical correlation is recommended. A 1 cm hypoechoic focus within the liver is not characterized but may represent an area of fat sparing or correspond to the hypoechoic structure seen on the CT of 11/20/2017. Portal vein is patent on color Doppler imaging with normal direction of blood flow towards the liver. IVC: No abnormality visualized. Pancreas: Visualized portion unremarkable. Spleen: Size and appearance within normal limits. Right Kidney: Length: 12 cm. Normal echogenicity. No hydronephrosis or shadowing stone. Left Kidney: Length: 12 cm. Normal echogenicity. No hydronephrosis or shadowing stone. Abdominal aorta: No aneurysm visualized. Other findings: None. IMPRESSION: 1. Fatty liver. 2. No gallstone. Electronically Signed   By: Anner Crete M.D.   On: 04/05/2019 20:54   IR Fluoro Guide CV Line Right  Result Date: 04/10/2019 INDICATION: End-stage renal disease, no current access EXAM: ULTRASOUND GUIDANCE FOR VASCULAR ACCESS RIGHT INTERNAL JUGULAR PERMANENT HEMODIALYSIS CATHETER Date:  04/10/2019 04/10/2019 3:00 pm Radiologist:  Jerilynn Mages. Daryll Brod, MD Guidance:  Ultrasound and fluoroscopic FLUOROSCOPY TIME:  Fluoroscopy Time: 0 minutes 36 seconds (1.0 mGy). MEDICATIONS: Vancomycin 1 g within 1 hour of the procedure ANESTHESIA/SEDATION: Versed 1.0 mg IV; Fentanyl 50 mcg IV; Moderate Sedation Time:  16 minutes The patient was continuously monitored during the procedure by the interventional radiology nurse under my direct supervision. CONTRAST:  None. COMPLICATIONS: None immediate. PROCEDURE: Informed consent was obtained from the patient following explanation of the procedure, risks, benefits and alternatives. The patient understands, agrees and consents for the procedure. All questions were addressed. A time out was  performed. Maximal barrier sterile technique utilized including caps, mask, sterile gowns, sterile gloves, large sterile drape, hand hygiene, and 2% chlorhexidine scrub. Under sterile conditions and local anesthesia, right internal jugular  micropuncture venous access was performed with ultrasound. Images were obtained for documentation of the patent right internal jugular vein. A guide wire was inserted followed by a transitional dilator. Next, a 0.035 guidewire was advanced into the IVC with a 5-French catheter. Measurements were obtained from the right venotomy site to the proximal right atrium. In the right infraclavicular chest, a subcutaneous tunnel was created under sterile conditions and local anesthesia. 1% lidocaine with epinephrine was utilized for this. The 19 cm tip to cuff dialysis catheter was tunneled subcutaneously to the venotomy site and inserted into the SVC/RA junction through a valved peel-away sheath. Position was confirmed with fluoroscopy. Images were obtained for documentation. Blood was aspirated from the catheter followed by saline and heparin flushes. The appropriate volume and strength of heparin was instilled in each lumen. Caps were applied. The catheter was secured at the tunnel site with Gelfoam and a pursestring suture. The venotomy site was closed with subcuticular Vicryl suture. Dermabond was applied to the small right neck incision. A dry sterile dressing was applied. The catheter is ready for use. No immediate complications. IMPRESSION: Ultrasound and fluoroscopically guided right internal jugular tunneled hemodialysis catheter (19 cm tip to cuff dialysis catheter). Electronically Signed   By: Jerilynn Mages.  Shick M.D.   On: 04/10/2019 15:03   IR Removal Tun Cv Cath W/O FL  Result Date: 04/17/2019 INDICATION: Patient with history of AKI s/p right IJ tunneled HD catheter placement in IR 04/10/2019 by Dr. Annamaria Boots. Patient with improved renal function and no longer requiring hemodialysis at  this time. Request is made for removal of tunneled hemodialysis catheter. EXAM: REMOVAL OF TUNNELED HEMODIALYSIS CATHETER MEDICATIONS: None COMPLICATIONS: None immediate. PROCEDURE: Informed written consent was obtained from the patient's sister, Tayveon Ohr, following an explanation of the procedure, risks, benefits and alternatives to treatment. A time out was performed prior to the initiation of the procedure. Maximal barrier sterile technique was utilized including mask, sterile gloves, hand hygiene, and ChloraPrep. Utilizing gentle traction, the catheter was removed intact. Hemostasis was obtained with manual compression. A dressing was placed. The patient tolerated the procedure well without immediate post procedural complication. IMPRESSION: Successful removal of tunneled dialysis catheter. Read by: Earley Abide, PA-C Electronically Signed   By: Markus Daft M.D.   On: 04/17/2019 16:23   IR US Guide Vasc Access Right  Result Date: 04/10/2019 INDICATION: End-stage renal disease, no current access EXAM: ULTRASOUND GUIDANCE FOR VASCULAR ACCESS RIGHT INTERNAL JUGULAR PERMANENT HEMODIALYSIS CATHETER Date:  04/10/2019 04/10/2019 3:00 pm Radiologist:  Jerilynn Mages. Daryll Brod, MD Guidance:  Ultrasound and fluoroscopic FLUOROSCOPY TIME:  Fluoroscopy Time: 0 minutes 36 seconds (1.0 mGy). MEDICATIONS: Vancomycin 1 g within 1 hour of the procedure ANESTHESIA/SEDATION: Versed 1.0 mg IV; Fentanyl 50 mcg IV; Moderate Sedation Time:  16 minutes The patient was continuously monitored during the procedure by the interventional radiology nurse under my direct supervision. CONTRAST:  None. COMPLICATIONS: None immediate. PROCEDURE: Informed consent was obtained from the patient following explanation of the procedure, risks, benefits and alternatives. The patient understands, agrees and consents for the procedure. All questions were addressed. A time out was performed. Maximal barrier sterile technique utilized including caps, mask, sterile  gowns, sterile gloves, large sterile drape, hand hygiene, and 2% chlorhexidine scrub. Under sterile conditions and local anesthesia, right internal jugular micropuncture venous access was performed with ultrasound. Images were obtained for documentation of the patent right internal jugular vein. A guide wire was inserted followed by a transitional dilator. Next, a 0.035 guidewire was advanced  into the IVC with a 5-French catheter. Measurements were obtained from the right venotomy site to the proximal right atrium. In the right infraclavicular chest, a subcutaneous tunnel was created under sterile conditions and local anesthesia. 1% lidocaine with epinephrine was utilized for this. The 19 cm tip to cuff dialysis catheter was tunneled subcutaneously to the venotomy site and inserted into the SVC/RA junction through a valved peel-away sheath. Position was confirmed with fluoroscopy. Images were obtained for documentation. Blood was aspirated from the catheter followed by saline and heparin flushes. The appropriate volume and strength of heparin was instilled in each lumen. Caps were applied. The catheter was secured at the tunnel site with Gelfoam and a pursestring suture. The venotomy site was closed with subcuticular Vicryl suture. Dermabond was applied to the small right neck incision. A dry sterile dressing was applied. The catheter is ready for use. No immediate complications. IMPRESSION: Ultrasound and fluoroscopically guided right internal jugular tunneled hemodialysis catheter (19 cm tip to cuff dialysis catheter). Electronically Signed   By: Jerilynn Mages.  Shick M.D.   On: 04/10/2019 15:03   DG CHEST PORT 1 VIEW  Result Date: 04/14/2019 CLINICAL DATA:  Pneumonia. Acute renal failure. Altered mental status. EXAM: PORTABLE CHEST 1 VIEW COMPARISON:  05/10/2019. FINDINGS: Dual-lumen right central line in stable position. Heart size stable. Low lung volumes. Improved aeration of both lungs from prior exam. No pleural  effusion or pneumothorax. IMPRESSION: 1.  Dual-lumen right central line in stable position. 2. Low lung volumes. Improved aeration of both lungs from prior exam. No focal infiltrate. Electronically Signed   By: Marcello Moores  Register   On: 04/14/2019 07:50   DG CHEST PORT 1 VIEW  Result Date: 04/12/2019 CLINICAL DATA:  Initial evaluation for acute shortness of breath. Wheezing. EXAM: PORTABLE CHEST 1 VIEW COMPARISON:  Prior radiograph from 04/06/2007. FINDINGS: Dual lumen right IJ approach central line in place with tip overlying the distal SVC. Moderate cardiomegaly. Mediastinal silhouette within normal limits. Lungs normally inflated. Mild diffuse pulmonary interstitial congestion without frank pulmonary edema. No visible pleural effusion. No focal infiltrates. No pneumothorax. No acute osseous finding. IMPRESSION: Moderate cardiomegaly with mild diffuse pulmonary interstitial congestion without frank pulmonary edema. Electronically Signed   By: Jeannine Boga M.D.   On: 04/12/2019 04:22   Overnight EEG with video  Result Date: 04/11/2019 Lora Havens, MD     04/12/2019  8:46 AM Patient Name:[name] [id number] Epilepsy Attending:Priyanka Barbra Sarks Referring Physician/Provider:Dr. Zeb Comfort Duration: 04/10/2019 1027  to 04/11/2019 1027  Patient history:49 year old male with prior history of seizure disorder who presented with altered mental status and was noted to have left sided focal motor seizures. EEG to evaluate for seizures.  Level of alertness:awake, asleep  AEDs during EEG study: Vimpat  Technical aspects: This EEG study was done with scalp electrodes positioned according to the 10-20 International system of electrode placement. Electrical activity was acquired at a sampling rate of 500Hz  and reviewed with a high frequency filter of 70Hz  and a low frequency filter of 1Hz . EEG data were recorded continuously and digitally stored.  Description: During awake state, no clear  posterior dominant rhythm was seen.  Sleep was characterized by vertex waves, sleep spindles (12 to 14 Hz), maximal frontocentral.  EEG showed continuous generalized 3-6hz  theta-delta slowing. Triphasic waves at 1-1.5hz , generalized were noted at times, when patient was stimulated. Sharp transients were seen in right parieto-occipital region. Abnormality -Continuous slow, generalized - Triphasic waves, generalized  IMPRESSION: This study issuggestive of moderate to  severe diffuse encephalopathy non specific to etiology but could be secondary to toxic-metabolic causes.  No seizures ordefiniteepileptiform discharges were seen throughout the recording.  Priyanka Barbra Sarks   Overnight EEG with video  Result Date: 04/08/2019 Lora Havens, MD     04/08/2019 10:59 AM Patient Name: [name] MRN: [id number] Epilepsy Attending: Lora Havens Referring Physician/Provider: Dr. Zeb Comfort Duration:  04/07/2019 1528 to 04/08/2019 0918 Patient history: 49 year old male with prior history of seizure disorder who presented with altered mental status.  EEG to evaluate for seizures.  Level of alertness: awake, asleep  AEDs during EEG study:  Vimpat  Technical aspects: This EEG study was done with scalp electrodes positioned according to the 10-20 International system of electrode placement. Electrical activity was acquired at a sampling rate of 500Hz  and reviewed with a high frequency filter of 70Hz  and a low frequency filter of 1Hz . EEG data were recorded continuously and digitally stored.  Description: During awake state, no clear posterior dominant rhythm was seen.    Sleep was characterized by vertex waves, sleep spindles (12 to 14 Hz), maximal frontocentral.  EEG showed continuous rhythmic 2 to 3 Hz delta activity in right temporoparietal region, which at times appeared sharply contoured.  There was also continuous low amplitude 2 to 5 Hz theta - delta slowing in left hemisphere as well as intermittent  rhythmic generalized 2 to 3 Hz delta slowing.   Abnormality -Continuous rhythmic delta slowing, right temporoparietal region -Continuous slow, left hemisphere -Intermittent rhythmic slow, generalized  IMPRESSION: This study is suggestive of cortical dysfunction in the right temporoparietal region, likely secondary to underlying abnormality.  There is also evidence of cortical dysfunction in left hemisphere, nonspecific to etiology.    Additionally, there is evidence of mild to moderate diffuse encephalopathy.  No seizures or definite epileptiform discharges were seen throughout the recording. Priyanka Barbra Sarks    Subjective: Patient does not want to stay in the hospital despite being told multiple times the necessity as he continued to have GI bleed.  Discharge Exam: Vitals:   04/23/19 1300 04/23/19 1345  BP: (!) 105/59 106/62  Pulse: 65 (!) 57  Resp: 10 (!) 9  Temp: 98.1 F (36.7 C) 97.6 F (36.4 C)  SpO2: 100% 99%   Vitals:   04/23/19 1240 04/23/19 1250 04/23/19 1300 04/23/19 1345  BP: 94/61 (!) 94/52 (!) 105/59 106/62  Pulse: 60 (!) 57 65 (!) 57  Resp: 13 (!) 9 10 (!) 9  Temp:   98.1 F (36.7 C) 97.6 F (36.4 C)  TempSrc:   Temporal Oral  SpO2: 99% 100% 100% 99%  Weight:      Height:        General: Pt is alert, awake, not in acute distress Cardiovascular: RRR, S1/S2 +, no rubs, no gallops Respiratory: CTA bilaterally, no wheezing, no rhonchi Abdominal: Soft, NT, ND, bowel sounds + Extremities: no edema, no cyanosis   The results of significant diagnostics from this hospitalization (including imaging, microbiology, ancillary and laboratory) are listed below for reference.    Microbiology: Recent Results (from the past 240 hour(s))  Respiratory Panel by RT PCR (Flu A&B, Covid) - Nasopharyngeal Swab     Status: None   Collection Time: 04/21/19  2:10 AM   Specimen: Nasopharyngeal Swab  Result Value Ref Range Status   SARS Coronavirus 2 by RT PCR NEGATIVE NEGATIVE Final     Comment: (NOTE) SARS-CoV-2 target nucleic acids are NOT DETECTED. The SARS-CoV-2 RNA is generally detectable in  upper respiratoy specimens during the acute phase of infection. The lowest concentration of SARS-CoV-2 viral copies this assay can detect is 131 copies/mL. A negative result does not preclude SARS-Cov-2 infection and should not be used as the sole basis for treatment or other patient management decisions. A negative result may occur with  improper specimen collection/handling, submission of specimen other than nasopharyngeal swab, presence of viral mutation(s) within the areas targeted by this assay, and inadequate number of viral copies (<131 copies/mL). A negative result must be combined with clinical observations, patient history, and epidemiological information. The expected result is Negative. Fact Sheet for Patients:  PinkCheek.be Fact Sheet for Healthcare Providers:  GravelBags.it This test is not yet ap proved or cleared by the [place] FDA and  has been authorized for detection and/or diagnosis of SARS-CoV-2 by FDA under an Emergency Use Authorization (EUA). This EUA will remain  in effect (meaning this test can be used) for the duration of the COVID-19 declaration under Section 564(b)(1) of the Act, 21 U.S.C. section 360bbb-3(b)(1), unless the authorization is terminated or revoked sooner.    Influenza A by PCR NEGATIVE NEGATIVE Final   Influenza B by PCR NEGATIVE NEGATIVE Final    Comment: (NOTE) The Xpert Xpress SARS-CoV-2/FLU/RSV assay is intended as an aid in  the diagnosis of influenza from Nasopharyngeal swab specimens and  should not be used as a sole basis for treatment. Nasal washings and  aspirates are unacceptable for Xpert Xpress SARS-CoV-2/FLU/RSV  testing. Fact Sheet for Patients: PinkCheek.be Fact Sheet for Healthcare  Providers: GravelBags.it This test is not yet approved or cleared by the [place] FDA and  has been authorized for detection and/or diagnosis of SARS-CoV-2 by  FDA under an Emergency Use Authorization (EUA). This EUA will remain  in effect (meaning this test can be used) for the duration of the  Covid-19 declaration under Section 564(b)(1) of the Act, 21  U.S.C. section 360bbb-3(b)(1), unless the authorization is  terminated or revoked. Performed at [facility], [place] [address], [place], [place] [address]   MRSA PCR Screening     Status: None   Collection Time: 04/21/19 11:51 PM   Specimen: Nasal Mucosa; Nasopharyngeal  Result Value Ref Range Status   MRSA by PCR NEGATIVE NEGATIVE Final    Comment:        The GeneXpert MRSA Assay (FDA approved for NASAL specimens only), is one component of a comprehensive MRSA colonization surveillance program. It is not intended to diagnose MRSA infection nor to guide or monitor treatment for MRSA infections. Performed at [facility], [place] [address] [place], [place] [address]      Labs: BNP (last 3 results) No results for input(s): BNP in the last 8760 hours. Basic Metabolic Panel: Recent Labs  Lab 04/18/19 0858 04/19/19 0417 04/21/19 0207 04/22/19 1312 04/23/19 0645  NA 139 136 136 136 139  K 3.2* 3.4* 4.5 4.9 4.1  CL 106 109 110 116* 116*  CO2 21* 21* 13* 14* 18*  GLUCOSE 134* 131* 314* 158* 118*  BUN 59* 51* 65* 60* 40*  CREATININE 2.12* 1.97* 2.43* 1.81* 1.84*  CALCIUM 8.4* 8.0* 8.4* 7.1* 7.4*   Liver Function Tests: Recent Labs  Lab 04/21/19 0207 04/23/19 0645  AST 22 18  ALT 35 21  ALKPHOS 49 27*  BILITOT 0.6 0.5  PROT 5.3* 3.1*  ALBUMIN 2.9* 1.7*   No results for input(s): LIPASE, AMYLASE in the last 168 hours. No results for input(s): AMMONIA  in the last 168 hours. CBC: Recent Labs  Lab 04/21/19 1850 04/21/19 1850 04/22/19 1025 04/22/19 1312 04/22/19 1643  04/23/19 0645 04/23/19 1652  WBC 16.7*  --  29.8* 24.9* 27.7* 16.7*  --   HGB 6.2*   < > 9.4* 7.8* 7.6* 5.6* 9.0*  HCT 19.3*   < > 26.5* 22.6* 21.2* 15.8* 25.8*  MCV 83.2  --  87.7 88.3 87.2 88.8  --   PLT 182  --  109* 74* 100* 98*  --    < > = values in this interval not displayed.   Cardiac Enzymes: No results for input(s): CKTOTAL, CKMB, CKMBINDEX, TROPONINI in the last 168 hours. BNP: Invalid input(s): POCBNP CBG: Recent Labs  Lab 04/23/19 0106 04/23/19 0422 04/23/19 0745 04/23/19 1025 04/23/19 1347  GLUCAP 126* 99 110* 113* 105*   D-Dimer No results for input(s): DDIMER in the last 72 hours. Hgb A1c No results for input(s): HGBA1C in the last 72 hours. Lipid Profile No results for input(s): CHOL, HDL, LDLCALC, TRIG, CHOLHDL, LDLDIRECT in the last 72 hours. Thyroid function studies No results for input(s): TSH, T4TOTAL, T3FREE, THYROIDAB in the last 72 hours.  Invalid input(s): FREET3 Anemia work up No results for input(s): VITAMINB12, FOLATE, FERRITIN, TIBC, IRON, RETICCTPCT in the last 72 hours. Urinalysis    Component Value Date/Time   COLORURINE YELLOW 04/05/2019 1242   APPEARANCEUR HAZY (A) 04/05/2019 1242   LABSPEC 1.014 04/05/2019 1242   PHURINE 5.0 04/05/2019 1242   GLUCOSEU 50 (A) 04/05/2019 1242   HGBUR LARGE (A) 04/05/2019 1242   BILIRUBINUR NEGATIVE 04/05/2019 1242   KETONESUR NEGATIVE 04/05/2019 1242   PROTEINUR 100 (A) 04/05/2019 1242   UROBILINOGEN 0.2 09/08/2007 1029   NITRITE NEGATIVE 04/05/2019 1242   LEUKOCYTESUR NEGATIVE 04/05/2019 1242   Sepsis Labs Invalid input(s): PROCALCITONIN,  WBC,  LACTICIDVEN Microbiology Recent Results (from the past 240 hour(s))  Respiratory Panel by RT PCR (Flu A&B, Covid) - Nasopharyngeal Swab     Status: None   Collection Time: 04/21/19  2:10 AM   Specimen: Nasopharyngeal Swab  Result Value Ref Range Status   SARS Coronavirus 2 by RT PCR NEGATIVE NEGATIVE Final    Comment: (NOTE) SARS-CoV-2 target  nucleic acids are NOT DETECTED. The SARS-CoV-2 RNA is generally detectable in upper respiratoy specimens during the acute phase of infection. The lowest concentration of SARS-CoV-2 viral copies this assay can detect is 131 copies/mL. A negative result does not preclude SARS-Cov-2 infection and should not be used as the sole basis for treatment or other patient management decisions. A negative result may occur with  improper specimen collection/handling, submission of specimen other than nasopharyngeal swab, presence of viral mutation(s) within the areas targeted by this assay, and inadequate number of viral copies (<131 copies/mL). A negative result must be combined with clinical observations, patient history, and epidemiological information. The expected result is Negative. Fact Sheet for Patients:  PinkCheek.be Fact Sheet for Healthcare Providers:  GravelBags.it This test is not yet ap proved or cleared by the [place] FDA and  has been authorized for detection and/or diagnosis of SARS-CoV-2 by FDA under an Emergency Use Authorization (EUA). This EUA will remain  in effect (meaning this test can be used) for the duration of the COVID-19 declaration under Section 564(b)(1) of the Act, 21 U.S.C. section 360bbb-3(b)(1), unless the authorization is terminated or revoked sooner.    Influenza A by PCR NEGATIVE NEGATIVE Final   Influenza B by PCR NEGATIVE NEGATIVE Final  Comment: (NOTE) The Xpert Xpress SARS-CoV-2/FLU/RSV assay is intended as an aid in  the diagnosis of influenza from Nasopharyngeal swab specimens and  should not be used as a sole basis for treatment. Nasal washings and  aspirates are unacceptable for Xpert Xpress SARS-CoV-2/FLU/RSV  testing. Fact Sheet for Patients: PinkCheek.be Fact Sheet for Healthcare Providers: GravelBags.it This test is not  yet approved or cleared by the [place] FDA and  has been authorized for detection and/or diagnosis of SARS-CoV-2 by  FDA under an Emergency Use Authorization (EUA). This EUA will remain  in effect (meaning this test can be used) for the duration of the  Covid-19 declaration under Section 564(b)(1) of the Act, 21  U.S.C. section 360bbb-3(b)(1), unless the authorization is  terminated or revoked. Performed at [facility], [place] [address], [place], [place] [address]   MRSA PCR Screening     Status: None   Collection Time: 04/21/19 11:51 PM   Specimen: Nasal Mucosa; Nasopharyngeal  Result Value Ref Range Status   MRSA by PCR NEGATIVE NEGATIVE Final    Comment:        The GeneXpert MRSA Assay (FDA approved for NASAL specimens only), is one component of a comprehensive MRSA colonization surveillance program. It is not intended to diagnose MRSA infection nor to guide or monitor treatment for MRSA infections. Performed at [facility], [place] [address] [place], [place] [address]     Time coordinating discharge: Over 30 minutes  SIGNED:  Lorella Nimrod, MD  [facility] 04/24/2019, 5:15 PM Pager [phone]  If 7PM-7AM, please contact night-coverage [email or web address] Password [email or web address]  This record has been created using Systems analyst. Errors have been sought and corrected,but may not always be located. Such creation errors do not reflect on the standard of care.

## 2019-04-27 NOTE — Anesthesia Postprocedure Evaluation (Signed)
Anesthesia Post Note  Patient: [name]  Procedure(s) Performed: ESOPHAGOGASTRODUODENOSCOPY (EGD) WITH PROPOFOL (N/A )     Patient location during evaluation: Endoscopy Anesthesia Type: MAC Level of consciousness: awake Pain management: pain level controlled Vital Signs Assessment: post-procedure vital signs reviewed and stable Respiratory status: spontaneous breathing Cardiovascular status: stable Postop Assessment: no apparent nausea or vomiting Anesthetic complications: no    Last Vitals:  Vitals:   04/23/19 1300 04/23/19 1345  BP: (!) 105/59 106/62  Pulse: 65 (!) 57  Resp: 10 (!) 9  Temp: 36.7 C 36.4 C  SpO2: 100% 99%    Last Pain:  Vitals:   04/23/19 1345  TempSrc: Oral  PainSc:    Pain Goal: Patients Stated Pain Goal: 3 (04/23/19 1022)                 Huston Foley

## 2019-04-30 ENCOUNTER — Ambulatory Visit: Payer: Self-pay | Attending: Internal Medicine | Admitting: Internal Medicine

## 2019-04-30 ENCOUNTER — Other Ambulatory Visit: Payer: Self-pay

## 2019-04-30 ENCOUNTER — Encounter: Payer: Self-pay | Admitting: Internal Medicine

## 2019-04-30 ENCOUNTER — Ambulatory Visit: Payer: Self-pay | Admitting: Pharmacist

## 2019-04-30 VITALS — BP 124/86 | HR 94 | Temp 98.2°F | Resp 16 | Ht 69.0 in | Wt 124.6 lb

## 2019-04-30 DIAGNOSIS — N289 Disorder of kidney and ureter, unspecified: Secondary | ICD-10-CM

## 2019-04-30 DIAGNOSIS — G40909 Epilepsy, unspecified, not intractable, without status epilepticus: Secondary | ICD-10-CM

## 2019-04-30 DIAGNOSIS — K279 Peptic ulcer, site unspecified, unspecified as acute or chronic, without hemorrhage or perforation: Secondary | ICD-10-CM

## 2019-04-30 DIAGNOSIS — D62 Acute posthemorrhagic anemia: Secondary | ICD-10-CM

## 2019-04-30 DIAGNOSIS — E119 Type 2 diabetes mellitus without complications: Secondary | ICD-10-CM

## 2019-04-30 DIAGNOSIS — K760 Fatty (change of) liver, not elsewhere classified: Secondary | ICD-10-CM

## 2019-04-30 LAB — GLUCOSE, POCT (MANUAL RESULT ENTRY): POC Glucose: 88 mg/dl (ref 70–99)

## 2019-04-30 MED ORDER — LEVETIRACETAM 500 MG PO TABS: 500.0000 mg | ORAL_TABLET | Freq: Two times a day (BID) | ORAL | 5 refills | Status: DC

## 2019-04-30 MED ORDER — LANTUS SOLOSTAR 100 UNIT/ML ~~LOC~~ SOPN: 5.0000 [IU] | PEN_INJECTOR | Freq: Every day | SUBCUTANEOUS | 11 refills | Status: DC

## 2019-04-30 MED ORDER — PANTOPRAZOLE SODIUM 40 MG PO TBEC: 40.0000 mg | DELAYED_RELEASE_TABLET | Freq: Two times a day (BID) | ORAL | 6 refills | Status: DC

## 2019-04-30 MED ORDER — PEN NEEDLES 31G X 8 MM MISC: 6 refills | Status: DC

## 2019-04-30 MED FILL — !LANTUS SOLOSTAR 100UNITS/M: 100 | 28 days supply | Qty: 3 | Fill #0

## 2019-04-30 MED FILL — levETIRAcetam 500 MG TABS: 500 | 30 days supply | Qty: 60 | Fill #0

## 2019-04-30 MED FILL — TRUEPLUS PEN NDL 31GX5/16": 31G X 8 MM | 25 days supply | Qty: 100 | Fill #0

## 2019-04-30 MED FILL — PANTOPRAZOLE SOD DR 40 MG T: 40 | 30 days supply | Qty: 60 | Fill #0

## 2019-04-30 MED FILL — TRUEPLUS PEN NDL 31GX5/16: 31G X 8 MM | 25 days supply | Qty: 100 | Fill #0

## 2019-04-30 NOTE — Progress Notes (Signed)
Pt states he is not in any pain he is just sore   Pt sister is concerned if his blood levels are still low  Pt sister states that pt checked himself out of the hospital last admission

## 2019-04-30 NOTE — Progress Notes (Signed)
Patient ID: [name], male    DOB: 10-Dec-1970  MRN: [id number]  CC: Hospitalization Follow-up   Subjective: [name] is a 49 y.o. male who presents for new pt visit and hosp f/u.  Pt's sister, [name], is with him and gives most of the hx. His concerns today include:  Pt with hx of DM type 2, Sz disorder, PUD (GIB 04/2019), HTN  No previous PCP.  Pt hosp x over the past 1 mth [place] 04/05/19-04/19/2019 with acute metabolic encephalopathy in setting of ARF and rhabo. Bun/Creat 159/11.5.  Had HD x 2 with subsequent improvement in kidney function.  MRI brain was abn showing some edema.  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  Pt had sz x 2. Started on IV steroid x 5 days.  LP without telling dx.  Started on Vimpat, changed to [place].  Plan was for f/u with neurology [place] course complicated by new DM with A1C 7.5 in setting of IV steroid. HTN - started on Norvasc UGIB - episodes of melena with Hb drop to 7. Transfused 1 unit PRBC. EGD + gastric and duodenal ulcers; one treated with EDG hemostatic therapy.  PPI added.  Given IV iron x 1. Pt dischg home  [place] again 1/12-15/2021 after blood bowel movements at home and syncopy.  Repeat EGD revealed non-bleeding ulcers but duodenal ulcer with exposed vessels which were clipped.  Transfused 7 units PRBC.  Pt left AMA after becoming upset about not being fed.  GIB/Anemia:  Denies any futher blood in stools or black stools.  No abdominal pain.  Sister states pt was taking NSAID prior to hosp for ear or tooth pain. - Endorses decrease energy and dizziness.  Not on iron supplement.  Sister states most of his family are allergic to sulfa drugs and she thinks he may be too.   -need f/u with GI Dr. Raquel James  DM:  New onset.  Checking BS BID before meals,  Sister has log,  Range 257-330.  On Amaryl.  Eating okay.  Could benefit from more dietary counseling. No numbness in hands/feet. No blurred vision.  Renal Insuff:   Kidney function was improving.  Has appt with nephrology Dr. Johnney Ou 05/27/19.  Urinating okay  Sz disorder:  Taking keppra.  No sz since dischg.  Pt reports having had sz as a baby then 2-3 sz as an adult > 10 yrs ago.  Was on med for a period then relocated to [place].  He was checked out there and no reason for sz ds found but he remained off med.  -needs f/u appt with neurology Applied for Medicaid while in hosp.  Past med, soc, fhx, surgical hx reviewed Patient Active Problem List   Diagnosis Date Noted  . Acute GI bleeding 04/21/2019  . Diabetes mellitus type 2 in nonobese ([place]) 04/21/2019  . Seizures ([place]) 04/19/2019  . Acute blood loss anemia   . Duodenal ulcer hemorrhage   . Acute gastric ulcer without hemorrhage or perforation   . Acute renal failure (ARF) ([place]) 04/05/2019  . Acute metabolic encephalopathy [id number]     Current Outpatient Medications on File Prior to Visit  Medication Sig Dispense Refill  . acetaminophen (TYLENOL) 500 MG tablet Take 1 tablet (500 mg total) by mouth every 6 (six) hours as needed for mild pain, moderate pain, fever or headache.    . fexofenadine (ALLEGRA) 180 MG tablet Take 180 mg by mouth daily as needed for allergies or rhinitis.    Marland Kitchen  glimepiride (AMARYL) 2 MG tablet Take 1 tablet (2 mg total) by mouth every morning. 30 tablet 1   No current facility-administered medications on file prior to visit.    Allergies  Allergen Reactions  . Penicillins Hives and Other (See Comments)    Childhood allergy  Did it involve swelling of the face/tongue/throat, SOB, or low BP? Unknown Did it involve sudden or severe rash/hives, skin peeling, or any reaction on the inside of your mouth or nose? Unknown Did you need to seek medical attention at a hospital or doctor's office? Unknown When did it last happen?Pt was a child If all above answers are "NO", may proceed with cephalosporin use. Sister states he just breaks out in hives unsure about other  reactions.    . Sulfur Hives and Rash    Social History   Socioeconomic History  . Marital status: Single    Spouse name: Not on file  . Number of children: Not on file  . Years of education: Not on file  . Highest education level: Not on file  Occupational History  . Not on file  Tobacco Use  . Smoking status: Never Smoker  . Smokeless tobacco: Never Used  Substance and Sexual Activity  . Alcohol use: No  . Drug use: Yes    Types: Marijuana  . Sexual activity: Not on file  Other Topics Concern  . Not on file  Social History Narrative  . Not on file   Social Determinants of Health   Financial Resource Strain:   . Difficulty of Paying Living Expenses: Not on file  Food Insecurity:   . Worried About Charity fundraiser in the Last Year: Not on file  . Ran Out of Food in the Last Year: Not on file  Transportation Needs:   . Lack of Transportation (Medical): Not on file  . Lack of Transportation (Non-Medical): Not on file  Physical Activity:   . Days of Exercise per Week: Not on file  . Minutes of Exercise per Session: Not on file  Stress:   . Feeling of Stress : Not on file  Social Connections:   . Frequency of Communication with Friends and Family: Not on file  . Frequency of Social Gatherings with Friends and Family: Not on file  . Attends Religious Services: Not on file  . Active Member of Clubs or Organizations: Not on file  . Attends Archivist Meetings: Not on file  . Marital Status: Not on file  Intimate Partner Violence:   . Fear of Current or Ex-Partner: Not on file  . Emotionally Abused: Not on file  . Physically Abused: Not on file  . Sexually Abused: Not on file    Family History  Problem Relation Age of Onset  . Diabetes Mother   . Diabetes Sister     Past Surgical History:  Procedure Laterality Date  . BIOPSY  04/17/2019   Procedure: BIOPSY;  Surgeon: Irene Shipper, MD;  Location: [facility] ENDOSCOPY;  Service: Endoscopy;;  .  ESOPHAGOGASTRODUODENOSCOPY Left 04/21/2019   Procedure: ESOPHAGOGASTRODUODENOSCOPY (EGD);  Surgeon: Jerene Bears, MD;  Location: [facility] ENDOSCOPY;  Service: Gastroenterology;  Laterality: Left;  . ESOPHAGOGASTRODUODENOSCOPY (EGD) WITH PROPOFOL N/A 04/17/2019   Procedure: ESOPHAGOGASTRODUODENOSCOPY (EGD) WITH PROPOFOL;  Surgeon: Irene Shipper, MD;  Location: [facility] ENDOSCOPY;  Service: Endoscopy;  Laterality: N/A;  . ESOPHAGOGASTRODUODENOSCOPY (EGD) WITH PROPOFOL N/A 04/23/2019   Procedure: ESOPHAGOGASTRODUODENOSCOPY (EGD) WITH PROPOFOL;  Surgeon: Jerene Bears, MD;  Location: [facility] ENDOSCOPY;  Service: Gastroenterology;  Laterality: N/A;  . HEMOSTASIS CLIP PLACEMENT  04/17/2019   Procedure: HEMOSTASIS CLIP PLACEMENT;  Surgeon: Irene Shipper, MD;  Location: [facility] ENDOSCOPY;  Service: Endoscopy;;  . HEMOSTASIS CLIP PLACEMENT  04/21/2019   Procedure: HEMOSTASIS CLIP PLACEMENT;  Surgeon: Jerene Bears, MD;  Location: [facility] ENDOSCOPY;  Service: Gastroenterology;;  . IR FLUORO GUIDE CV LINE RIGHT  04/10/2019  . IR REMOVAL TUN CV CATH W/O FL  04/17/2019  . IR US GUIDE VASC ACCESS RIGHT  04/10/2019  . SUBMUCOSAL INJECTION  04/17/2019   Procedure: SUBMUCOSAL INJECTION;  Surgeon: Irene Shipper, MD;  Location: [facility] ENDOSCOPY;  Service: Endoscopy;;    ROS: Review of Systems Negative except as stated above  PHYSICAL EXAM: BP 124/86   Pulse 94   Temp 98.2 F (36.8 C) (Oral)   Resp 16   Ht 5\' 9"  (1.753 m)   Wt 124 lb 9.6 oz (56.5 kg)   SpO2 100%   BMI 18.40 kg/m   Physical Exam  General appearance - alert, male who looks older than stated age and appears chronically ill and in no distress Mental status - quiet demeanor.  Answers questions appropriately Eyes - pale conjunctiva Mouth - mucous membranes moist, pharynx normal without lesions Neck - supple, no significant adenopathy, no thyroid enlargement Lymphatics - no cervical or axillary LN Chest - clear to auscultation, no wheezes, rales or rhonchi, symmetric air  entry Heart - normal rate, regular rhythm, normal S1, S2, no murmurs, rubs, clicks or gallops Neurological - cranial nerves II through XII intact, motor and sensory grossly normal bilaterally Extremities - peripheral pulses normal, no pedal edema, no clubbing or cyanosis Diabetic Foot Exam - Simple   Simple Foot Form Visual Inspection See comments: Yes Sensation Testing Intact to touch and monofilament testing bilaterally: Yes Pulse Check Posterior Tibialis and Dorsalis pulse intact bilaterally: Yes Comments Callous medical aspect both big toe     CMP Latest Ref Rng & Units 04/30/2019 04/23/2019 04/22/2019  Glucose 65 - 99 mg/dL 66 118(H) 158(H)  BUN 6 - 24 mg/dL 12 40(H) 60(H)  Creatinine 0.76 - 1.27 mg/dL 1.17 1.84(H) 1.81(H)  Sodium 134 - 144 mmol/L 138 139 136  Potassium 3.5 - 5.2 mmol/L 4.8 4.1 4.9  Chloride 96 - 106 mmol/L 102 116(H) 116(H)  CO2 20 - 29 mmol/L 21 18(L) 14(L)  Calcium 8.7 - 10.2 mg/dL 8.8 7.4(L) 7.1(L)  Total Protein 6.0 - 8.5 g/dL 6.2 3.1(L) -  Total Bilirubin 0.0 - 1.2 mg/dL 0.3 0.5 -  Alkaline Phos 39 - 117 IU/L 82 27(L) -  AST 0 - 40 IU/L 35 18 -  ALT 0 - 44 IU/L 44 21 -   Lipid Panel     Component Value Date/Time   CHOL 226 (H) 04/30/2019 1633   TRIG 152 (H) 04/30/2019 1633   HDL 31 (L) 04/30/2019 1633   CHOLHDL 7.3 (H) 04/30/2019 1633   LDLCALC 167 (H) 04/30/2019 1633    CBC    Component Value Date/Time   WBC 6.5 04/30/2019 1633   WBC 16.7 (H) 04/23/2019 0645   RBC 3.60 (L) 04/30/2019 1633   RBC 1.78 (L) 04/23/2019 0645   HGB 10.5 (L) 04/30/2019 1633   HCT 33.0 (L) 04/30/2019 1633   PLT 367 04/30/2019 1633   MCV 92 04/30/2019 1633   MCH 29.2 04/30/2019 1633   MCH 31.5 04/23/2019 0645   MCHC 31.8 04/30/2019 1633   MCHC 35.4 04/23/2019 0645   RDW 14.3 04/30/2019  1633   LYMPHSABS 1.3 04/15/2019 0301   MONOABS 0.6 04/15/2019 0301   EOSABS 0.0 04/15/2019 0301   BASOSABS 0.0 04/15/2019 0301   CT scan report of abdomen reviewed - showed  fatty liver.  ASSESSMENT AND PLAN: 1. Acute blood loss anemia Secondary to GI bleed/PUD Advise to stop all NSAIDS Repeat CBC today.  Likely would need iron supplementation.  ? Sulfa allergy but pt on Amaryl which is a sulfa drug.  Will check with out clinical pharmacist about this and to see what other oral iron products are available - CBC  2. PUD (peptic ulcer disease) Continue Protonix for at least 8 wks and get him in with Dr. Raquel James Advise to apply for [facility] discount while awaiting medicaid - pantoprazole (PROTONIX) 40 MG tablet; Take 1 tablet (40 mg total) by mouth 2 (two) times daily.  Dispense: 60 tablet; Refill: 6  3. New onset type 2 diabetes mellitus ([place]) Dietary counseling given Stop Amaryl and put him on Lantus until we see what his kidney function looks like.  Went over signs, symptoms of hypoglycemia and how to treat Encourage to get eye exam yearly - POCT glucose (manual entry) - Microalbumin / creatinine urine ratio - Insulin Glargine (LANTUS SOLOSTAR) 100 UNIT/ML Solostar Pen; Inject 5 Units into the skin at bedtime.  Dispense: 15 mL; Refill: 11 - Insulin Pen Needle (PEN NEEDLES) 31G X 8 MM MISC; UAD  Dispense: 100 each; Refill: 6 - Comprehensive metabolic panel - Lipid panel  4. Seizure disorder [facility]) Refer to neurology for f/u once he has [facility] discount - levETIRAcetam (KEPPRA) 500 MG tablet; Take 1 tablet (500 mg total) by mouth 2 (two) times daily.  Dispense: 60 tablet; Refill: 5  5. Acute renal insufficiency Recheck chem today  6. Fatty liver Discussed dx. Commonly seen in DM    About 45 mins spent total reviewing pt's records from 2 hospitalizations, meeting face to face with him and sister to discuss dx, management and co-ordinate care Patient was given the opportunity to ask questions.  Patient verbalized understanding of the plan and was able to repeat key elements of the plan.   Orders Placed This Encounter  Procedures  . Microalbumin /  creatinine urine ratio  . CBC  . Comprehensive metabolic panel  . Lipid panel  . POCT glucose (manual entry)     Requested Prescriptions   Signed Prescriptions Disp Refills  . Insulin Glargine (LANTUS SOLOSTAR) 100 UNIT/ML Solostar Pen 15 mL 11    Sig: Inject 5 Units into the skin at bedtime.  . Insulin Pen Needle (PEN NEEDLES) 31G X 8 MM MISC 100 each 6    Sig: UAD  . pantoprazole (PROTONIX) 40 MG tablet 60 tablet 6    Sig: Take 1 tablet (40 mg total) by mouth 2 (two) times daily.  Marland Kitchen levETIRAcetam (KEPPRA) 500 MG tablet 60 tablet 5    Sig: Take 1 tablet (500 mg total) by mouth 2 (two) times daily.    Return in about 4 weeks (around 05/28/2019).  Karle Plumber, MD, FACP

## 2019-04-30 NOTE — Patient Instructions (Signed)
Hold Amaryl for now since we are starting him on Lantus insulin.  Continue to monitor blood sugars.  Our goal is to keep the blood sugars before meals between 90-130.  Please fill out the forms for the orange card/[facility] discount card as soon as possible and make an appointment with our financial specialist once you have completed the documents.   Diabetes Mellitus and Nutrition, Adult When you have diabetes (diabetes mellitus), it is very important to have healthy eating habits because your blood sugar (glucose) levels are greatly affected by what you eat and drink. Eating healthy foods in the appropriate amounts, at about the same times every day, can help you:  Control your blood glucose.  Lower your risk of heart disease.  Improve your blood pressure.  Reach or maintain a healthy weight. Every person with diabetes is different, and each person has different needs for a meal plan. Your health care provider may recommend that you work with a diet and nutrition specialist (dietitian) to make a meal plan that is best for you. Your meal plan may vary depending on factors such as:  The calories you need.  The medicines you take.  Your weight.  Your blood glucose, blood pressure, and cholesterol levels.  Your activity level.  Other health conditions you have, such as heart or kidney disease. How do carbohydrates affect me? Carbohydrates, also called carbs, affect your blood glucose level more than any other type of food. Eating carbs naturally raises the amount of glucose in your blood. Carb counting is a method for keeping track of how many carbs you eat. Counting carbs is important to keep your blood glucose at a healthy level, especially if you use insulin or take certain oral diabetes medicines. It is important to know how many carbs you can safely have in each meal. This is different for every person. Your dietitian can help you calculate how many carbs you should have at each meal and  for each snack. Foods that contain carbs include:  Bread, cereal, rice, pasta, and crackers.  Potatoes and corn.  Peas, beans, and lentils.  Milk and yogurt.  Fruit and juice.  Desserts, such as cakes, cookies, ice cream, and candy. How does alcohol affect me? Alcohol can cause a sudden decrease in blood glucose (hypoglycemia), especially if you use insulin or take certain oral diabetes medicines. Hypoglycemia can be a life-threatening condition. Symptoms of hypoglycemia (sleepiness, dizziness, and confusion) are similar to symptoms of having too much alcohol. If your health care provider says that alcohol is safe for you, follow these guidelines:  Limit alcohol intake to no more than 1 drink per day for nonpregnant women and 2 drinks per day for men. One drink equals 12 oz of beer, 5 oz of wine, or 1 oz of hard liquor.  Do not drink on an empty stomach.  Keep yourself hydrated with water, diet soda, or unsweetened iced tea.  Keep in mind that regular soda, juice, and other mixers may contain a lot of sugar and must be counted as carbs. What are tips for following this plan?  Reading food labels  Start by checking the serving size on the "Nutrition Facts" label of packaged foods and drinks. The amount of calories, carbs, fats, and other nutrients listed on the label is based on one serving of the item. Many items contain more than one serving per package.  Check the total grams (g) of carbs in one serving. You can calculate the number of servings  of carbs in one serving by dividing the total carbs by 15. For example, if a food has 30 g of total carbs, it would be equal to 2 servings of carbs.  Check the number of grams (g) of saturated and trans fats in one serving. Choose foods that have low or no amount of these fats.  Check the number of milligrams (mg) of salt (sodium) in one serving. Most people should limit total sodium intake to less than 2,300 mg per day.  Always check  the nutrition information of foods labeled as "low-fat" or "nonfat". These foods may be higher in added sugar or refined carbs and should be avoided.  Talk to your dietitian to identify your daily goals for nutrients listed on the label. Shopping  Avoid buying canned, premade, or processed foods. These foods tend to be high in fat, sodium, and added sugar.  Shop around the outside edge of the grocery store. This includes fresh fruits and vegetables, bulk grains, fresh meats, and fresh dairy. Cooking  Use low-heat cooking methods, such as baking, instead of high-heat cooking methods like deep frying.  Cook using healthy oils, such as olive, canola, or sunflower oil.  Avoid cooking with butter, cream, or high-fat meats. Meal planning  Eat meals and snacks regularly, preferably at the same times every day. Avoid going long periods of time without eating.  Eat foods high in fiber, such as fresh fruits, vegetables, beans, and whole grains. Talk to your dietitian about how many servings of carbs you can eat at each meal.  Eat 4-6 ounces (oz) of lean protein each day, such as lean meat, chicken, fish, eggs, or tofu. One oz of lean protein is equal to: ? 1 oz of meat, chicken, or fish. ? 1 egg. ?  cup of tofu.  Eat some foods each day that contain healthy fats, such as avocado, nuts, seeds, and fish. Lifestyle  Check your blood glucose regularly.  Exercise regularly as told by your health care provider. This may include: ? 150 minutes of moderate-intensity or vigorous-intensity exercise each week. This could be brisk walking, biking, or water aerobics. ? Stretching and doing strength exercises, such as yoga or weightlifting, at least 2 times a week.  Take medicines as told by your health care provider.  Do not use any products that contain nicotine or tobacco, such as cigarettes and e-cigarettes. If you need help quitting, ask your health care provider.  Work with a Social worker or  diabetes educator to identify strategies to manage stress and any emotional and social challenges. Questions to ask a health care provider  Do I need to meet with a diabetes educator?  Do I need to meet with a dietitian?  What number can I call if I have questions?  When are the best times to check my blood glucose? Where to find more information:  American Diabetes Association: diabetes.org  Academy of Nutrition and Dietetics: www.eatright.[employer] of Diabetes and Digestive and Kidney Diseases (NIH): [employer] Summary  A healthy meal plan will help you control your blood glucose and maintain a healthy lifestyle.  Working with a diet and nutrition specialist (dietitian) can help you make a meal plan that is best for you.  Keep in mind that carbohydrates (carbs) and alcohol have immediate effects on your blood glucose levels. It is important to count carbs and to use alcohol carefully. This information is not intended to replace advice given to you by your health care provider. Make sure  you discuss any questions you have with your health care provider. Document Revised: 03/08/2017 Document Reviewed: 04/30/2016 Elsevier Patient Education  2020 [employer].

## 2019-05-01 LAB — CBC
Hematocrit: 33 % — ABNORMAL LOW (ref 37.5–51.0)
Hemoglobin: 10.5 g/dL — ABNORMAL LOW (ref 13.0–17.7)
MCH: 29.2 pg (ref 26.6–33.0)
MCHC: 31.8 g/dL (ref 31.5–35.7)
MCV: 92 fL (ref 79–97)
Platelets: 367 10*3/uL (ref 150–450)
RBC: 3.6 x10E6/uL — ABNORMAL LOW (ref 4.14–5.80)
RDW: 14.3 % (ref 11.6–15.4)
WBC: 6.5 10*3/uL (ref 3.4–10.8)

## 2019-05-01 LAB — COMPREHENSIVE METABOLIC PANEL
ALT: 44 IU/L (ref 0–44)
AST: 35 IU/L (ref 0–40)
Albumin/Globulin Ratio: 1.7 (ref 1.2–2.2)
Albumin: 3.9 g/dL — ABNORMAL LOW (ref 4.0–5.0)
Alkaline Phosphatase: 82 IU/L (ref 39–117)
BUN/Creatinine Ratio: 10 (ref 9–20)
BUN: 12 mg/dL (ref 6–24)
Bilirubin Total: 0.3 mg/dL (ref 0.0–1.2)
CO2: 21 mmol/L (ref 20–29)
Calcium: 8.8 mg/dL (ref 8.7–10.2)
Chloride: 102 mmol/L (ref 96–106)
Creatinine, Ser: 1.17 mg/dL (ref 0.76–1.27)
GFR calc Af Amer: 84 mL/min/{1.73_m2} (ref >59)
GFR calc non Af Amer: 73 mL/min/{1.73_m2} (ref >59)
Globulin, Total: 2.3 g/dL (ref 1.5–4.5)
Glucose: 66 mg/dL (ref 65–99)
Potassium: 4.8 mmol/L (ref 3.5–5.2)
Sodium: 138 mmol/L (ref 134–144)
Total Protein: 6.2 g/dL (ref 6.0–8.5)

## 2019-05-01 LAB — LIPID PANEL
Chol/HDL Ratio: 7.3 ratio — ABNORMAL HIGH (ref 0.0–5.0)
Cholesterol, Total: 226 mg/dL — ABNORMAL HIGH (ref 100–199)
HDL: 31 mg/dL — ABNORMAL LOW (ref >39)
LDL Chol Calc (NIH): 167 mg/dL — ABNORMAL HIGH (ref 0–99)
Triglycerides: 152 mg/dL — ABNORMAL HIGH (ref 0–149)
VLDL Cholesterol Cal: 28 mg/dL (ref 5–40)

## 2019-05-01 LAB — MICROALBUMIN / CREATININE URINE RATIO
Creatinine, Urine: 47.7 mg/dL
Microalb/Creat Ratio: 40 mg/g creat — ABNORMAL HIGH (ref 0–29)
Microalbumin, Urine: 19.1 ug/mL

## 2019-05-03 ENCOUNTER — Telehealth: Payer: Self-pay | Admitting: Internal Medicine

## 2019-05-03 NOTE — Telephone Encounter (Signed)
PC placed to pt's sister Xaivier Malay on 05/01/2019.  Informer her that pt is still anemic but H/H had improved.  I recommend starting him on iron supplement daily for several weeks.  I will check with our clinical pharmacist to see if we have something other than ferrous sulfate.  They are will to give it a try if we don't since pt had been on Amaryl without getting hives. LDL cholesterol not at goal.  Given that he has DM and LFTs are okay.  I recommend starting Lipitor to help reduce CV risk.  Will send rxn to pharmacy. Bun/Creat back to nl.  eGFR significantly improved.  She was about to tell me his BS readings since being on Lantus but her phone went dead.  I tried calling back x 2 but went straight to VM.

## 2019-05-04 ENCOUNTER — Telehealth: Payer: Self-pay | Admitting: Internal Medicine

## 2019-05-04 ENCOUNTER — Encounter: Payer: Self-pay | Admitting: Internal Medicine

## 2019-05-04 NOTE — Telephone Encounter (Signed)
I checked with our pharmacy and we only have ferrous sulfate. I looked at this patient's chart and it appears the sulfur allergy originated with a reported allergy to sulfa as reported by the sister. The sulfa antibiotics contain a sulfonamide moiety, as does glimepiride. If he tolerates glimepiride, I question if it is a true allergy. With that being said, ferrous gluconate does not contain sulfur,  is OTC. and can be obtained at another pharmacy. The elemental iron in a 324 mg ferrous gluconate is ~38 mg.

## 2019-05-04 NOTE — Telephone Encounter (Signed)
Return PC placed to sister this evening around 6:30 p.m.  Phone rang without answer.  Tried again several minutes later with same results.  Will try again in a.m

## 2019-05-04 NOTE — Telephone Encounter (Signed)
Will forward to pcp

## 2019-05-04 NOTE — Telephone Encounter (Signed)
Message for Dr. Wynetta Emery: Patient sister calling in regards to brothers insulin. Has a couple questions. States that she spoke with you on 05/01/2019 and the call got disconnected. Please give her a call as she is concerned with his medication and would like to speak with you before she picks it up.

## 2019-05-05 DIAGNOSIS — M6282 Rhabdomyolysis: Secondary | ICD-10-CM

## 2019-05-05 MED ORDER — ATORVASTATIN CALCIUM 10 MG PO TABS: 10.0000 mg | ORAL_TABLET | Freq: Every day | ORAL | 3 refills | Status: DC

## 2019-05-05 MED ORDER — FERROUS SULFATE 325 (65 FE) MG PO TABS: 325.0000 mg | ORAL_TABLET | Freq: Every day | ORAL | 1 refills | Status: DC

## 2019-05-05 MED FILL — ATORVASTATIN 10 MG TABLET: 10 | 30 days supply | Qty: 30 | Fill #0

## 2019-05-05 MED FILL — FERROUS SULFATE 325 MG TAB: 325 (65 FE) | 30 days supply | Qty: 30 | Fill #0

## 2019-05-05 NOTE — Telephone Encounter (Signed)
I was able to reach patient's sister, [name],  this afternoon on her cell phone. I told her that I did speak with our clinical pharmacist and he reports that the iron supplement called ferrous gluconate is available over-the-counter and should not cause substantial increase in blood sugar.  We do carry the ferrous sulfate at our pharmacy.  I asked whether the patient has had reaction to sulfa drugs in the past.  Sister said no it is just that other family members mainly the sisters and mother are allergic to sulfa drugs and they thought  he would be too.  They are willing to try the ferrous sulfate since they can get it at our pharmacy.  In regards to his blood sugars, she did give me the log of blood sugar readings since he last saw me on the 21st of this month.  She has been checking his blood sugars 3 times a day before meals.  Blood sugars have been between 263-396.  He is on Lantus 5 units.  Blood sugar this morning however was 126.  I recommend increasing the Lantus insulin to 10 units at bedtime.  I request that she call me back in about 2 to 3 days to let me know what the blood sugars are doing.  If still running high we can add Metformin since his kidney function is normalizing.

## 2019-05-25 ENCOUNTER — Ambulatory Visit: Payer: Self-pay

## 2019-05-25 ENCOUNTER — Encounter: Payer: Self-pay | Admitting: Internal Medicine

## 2019-05-25 ENCOUNTER — Other Ambulatory Visit: Payer: Self-pay

## 2019-05-25 ENCOUNTER — Ambulatory Visit: Payer: Medicaid Other | Attending: Internal Medicine | Admitting: Internal Medicine

## 2019-05-25 VITALS — BP 126/87 | HR 94 | Ht 69.0 in

## 2019-05-25 DIAGNOSIS — D649 Anemia, unspecified: Secondary | ICD-10-CM

## 2019-05-25 DIAGNOSIS — G40909 Epilepsy, unspecified, not intractable, without status epilepticus: Secondary | ICD-10-CM | POA: Diagnosis not present

## 2019-05-25 DIAGNOSIS — E119 Type 2 diabetes mellitus without complications: Secondary | ICD-10-CM

## 2019-05-25 DIAGNOSIS — K279 Peptic ulcer, site unspecified, unspecified as acute or chronic, without hemorrhage or perforation: Secondary | ICD-10-CM

## 2019-05-25 DIAGNOSIS — E785 Hyperlipidemia, unspecified: Secondary | ICD-10-CM

## 2019-05-25 DIAGNOSIS — R413 Other amnesia: Secondary | ICD-10-CM | POA: Diagnosis not present

## 2019-05-25 LAB — GLUCOSE, POCT (MANUAL RESULT ENTRY): POC Glucose: 164 mg/dl — AB (ref 70–99)

## 2019-05-25 LAB — POCT GLYCOSYLATED HEMOGLOBIN (HGB A1C): HbA1c, POC (controlled diabetic range): 5.1 % (ref 0.0–7.0)

## 2019-05-25 MED ORDER — GLIMEPIRIDE 2 MG PO TABS: 2.0000 mg | ORAL_TABLET | Freq: Every day | ORAL | 3 refills | Status: DC

## 2019-05-25 MED ORDER — LEVETIRACETAM 500 MG PO TABS: 500.0000 mg | ORAL_TABLET | Freq: Two times a day (BID) | ORAL | 5 refills | Status: DC

## 2019-05-25 MED ORDER — ATORVASTATIN CALCIUM 10 MG PO TABS: 10.0000 mg | ORAL_TABLET | Freq: Every day | ORAL | 3 refills | Status: DC

## 2019-05-25 MED FILL — PANTOPRAZOLE SOD DR 40 MG T: 40 | 90 days supply | Qty: 180 | Fill #1

## 2019-05-25 NOTE — Progress Notes (Signed)
Patient ID: [name], male    DOB: [birth year]  MRN: [id number]  CC: Diabetes   Subjective: [name] is a 49 y.o. male who presents for chronic ds management 4 weeks for follow-up.  Sister is with him and provides most of the history His concerns today include:  Pt with hx of DM type 2, Sz disorder, PUD (GIB 04/2019), HTN, fatty liver  Anemia: tolerating iron He has not had any further bleeding.    Sister plans to call and schedule appointment with gastroenterologist  DM: Sister checks his blood sugars twice a day before breakfast and dinner and has a log with her.  Blood sugars are good with a few outliers in the 200s.  When I last spoke with the sister we had increase the Lantus to 10 units daily.  However she said she kept it at 5 units because after she spoke with me the blood sugars started doing very well.  He does well with his eating habits.  Sister states that he cheats sometimes.  SZ: No seizures since last visit.  Compliant with taking Keppra.  Neurology appointment scheduled for 06/16/2019 Sister reports that patient has poor memory ever since hospitalization with encephalopathy.  He has become withdrawn and stares off into space and rarely engages in conversations.  He has questions about things that we discussed several minutes ago.  He does not recall the name of the family dog.  This morning he forgot and left the stove on.  He used to make cabinets for a living.  They have applied for disability for him.  Sister met with our financial counselor today, he is unable to apply for the orange card/cone discount at this time  Kidney function had just about normalized on chemistry that we did on last visit.  He has an appointment with the nephrologist on 05/27/2019  Patient Active Problem List   Diagnosis Date Noted  . Non-traumatic rhabdomyolysis   . Acute GI bleeding 04/21/2019  . Diabetes mellitus type 2 in nonobese ([place]) 04/21/2019  . Seizures ([place]) 04/19/2019  . Acute  blood loss anemia   . Duodenal ulcer hemorrhage   . Acute gastric ulcer without hemorrhage or perforation   . Acute renal failure (ARF) ([place]) 04/05/2019  . Acute metabolic encephalopathy 26/37/8588     Current Outpatient Medications on File Prior to Visit  Medication Sig Dispense Refill  . ferrous sulfate 325 (65 FE) MG tablet Take 1 tablet (325 mg total) by mouth daily with breakfast. 60 tablet 1  . fexofenadine (ALLEGRA) 180 MG tablet Take 180 mg by mouth daily as needed for allergies or rhinitis.    . Insulin Pen Needle (PEN NEEDLES) 31G X 8 MM MISC UAD 100 each 6  . pantoprazole (PROTONIX) 40 MG tablet Take 1 tablet (40 mg total) by mouth 2 (two) times daily. 60 tablet 6  . acetaminophen (TYLENOL) 500 MG tablet Take 1 tablet (500 mg total) by mouth every 6 (six) hours as needed for mild pain, moderate pain, fever or headache.     No current facility-administered medications on file prior to visit.    Allergies  Allergen Reactions  . Penicillins Hives and Other (See Comments)    Childhood allergy  Did it involve swelling of the face/tongue/throat, SOB, or low BP? Unknown Did it involve sudden or severe rash/hives, skin peeling, or any reaction on the inside of your mouth or nose? Unknown Did you need to seek medical attention at a hospital or  doctor's office? Unknown When did it last happen?Pt was a child If all above answers are "NO", may proceed with cephalosporin use. Sister states he just breaks out in hives unsure about other reactions.    . Sulfur Hives and Rash    Social History   Socioeconomic History  . Marital status: Single    Spouse name: Not on file  . Number of children: Not on file  . Years of education: Not on file  . Highest education level: Not on file  Occupational History  . Not on file  Tobacco Use  . Smoking status: Never Smoker  . Smokeless tobacco: Never Used  Substance and Sexual Activity  . Alcohol use: No  . Drug use: Yes    Types:  Marijuana  . Sexual activity: Not on file  Other Topics Concern  . Not on file  Social History Narrative  . Not on file   Social Determinants of Health   Financial Resource Strain:   . Difficulty of Paying Living Expenses: Not on file  Food Insecurity:   . Worried About Charity fundraiser in the Last Year: Not on file  . Ran Out of Food in the Last Year: Not on file  Transportation Needs:   . Lack of Transportation (Medical): Not on file  . Lack of Transportation (Non-Medical): Not on file  Physical Activity:   . Days of Exercise per Week: Not on file  . Minutes of Exercise per Session: Not on file  Stress:   . Feeling of Stress : Not on file  Social Connections:   . Frequency of Communication with Friends and Family: Not on file  . Frequency of Social Gatherings with Friends and Family: Not on file  . Attends Religious Services: Not on file  . Active Member of Clubs or Organizations: Not on file  . Attends Archivist Meetings: Not on file  . Marital Status: Not on file  Intimate Partner Violence:   . Fear of Current or Ex-Partner: Not on file  . Emotionally Abused: Not on file  . Physically Abused: Not on file  . Sexually Abused: Not on file    Family History  Problem Relation Age of Onset  . Diabetes Mother   . Diabetes Sister     Past Surgical History:  Procedure Laterality Date  . BIOPSY  04/17/2019   Procedure: BIOPSY;  Surgeon: Irene Shipper, MD;  Location: [facility] ENDOSCOPY;  Service: Endoscopy;;  . ESOPHAGOGASTRODUODENOSCOPY Left 04/21/2019   Procedure: ESOPHAGOGASTRODUODENOSCOPY (EGD);  Surgeon: Jerene Bears, MD;  Location: [facility] ENDOSCOPY;  Service: Gastroenterology;  Laterality: Left;  . ESOPHAGOGASTRODUODENOSCOPY (EGD) WITH PROPOFOL N/A 04/17/2019   Procedure: ESOPHAGOGASTRODUODENOSCOPY (EGD) WITH PROPOFOL;  Surgeon: Irene Shipper, MD;  Location: [facility] ENDOSCOPY;  Service: Endoscopy;  Laterality: N/A;  . ESOPHAGOGASTRODUODENOSCOPY (EGD) WITH PROPOFOL N/A  04/23/2019   Procedure: ESOPHAGOGASTRODUODENOSCOPY (EGD) WITH PROPOFOL;  Surgeon: Jerene Bears, MD;  Location: [facility] ENDOSCOPY;  Service: Gastroenterology;  Laterality: N/A;  . HEMOSTASIS CLIP PLACEMENT  04/17/2019   Procedure: HEMOSTASIS CLIP PLACEMENT;  Surgeon: Irene Shipper, MD;  Location: [facility] ENDOSCOPY;  Service: Endoscopy;;  . HEMOSTASIS CLIP PLACEMENT  04/21/2019   Procedure: HEMOSTASIS CLIP PLACEMENT;  Surgeon: Jerene Bears, MD;  Location: [facility] ENDOSCOPY;  Service: Gastroenterology;;  . IR FLUORO GUIDE CV LINE RIGHT  04/10/2019  . IR REMOVAL TUN CV CATH W/O FL  04/17/2019  . IR US GUIDE VASC ACCESS RIGHT  04/10/2019  . SUBMUCOSAL INJECTION  04/17/2019   Procedure: SUBMUCOSAL INJECTION;  Surgeon: Irene Shipper, MD;  Location: [facility] ENDOSCOPY;  Service: Endoscopy;;    ROS: Review of Systems Negative except as stated above  PHYSICAL EXAM: BP 126/87   Pulse 94   Ht 5' 9"  (1.753 m)   SpO2 100%   BMI 18.40 kg/m   Physical Exam  General appearance - alert, well appearing, and in no distress Mental status -patient is withdrawn.  Poor eye contact.  Does not engage much in conversation Neck - supple, no significant adenopathy Chest - clear to auscultation, no wheezes, rales or rhonchi, symmetric air entry Heart - normal rate, regular rhythm, normal S1, S2, no murmurs, rubs, clicks or gallops Neurological - scored 2/5 on MCog exam (was able to draw the face of a clock and set it to the correct time).  He knew the day of the week but not the month of the year.  CMP Latest Ref Rng & Units 04/30/2019 04/23/2019 04/22/2019  Glucose 65 - 99 mg/dL 66 118(H) 158(H)  BUN 6 - 24 mg/dL 12 40(H) 60(H)  Creatinine 0.76 - 1.27 mg/dL 1.17 1.84(H) 1.81(H)  Sodium 134 - 144 mmol/L 138 139 136  Potassium 3.5 - 5.2 mmol/L 4.8 4.1 4.9  Chloride 96 - 106 mmol/L 102 116(H) 116(H)  CO2 20 - 29 mmol/L 21 18(L) 14(L)  Calcium 8.7 - 10.2 mg/dL 8.8 7.4(L) 7.1(L)  Total Protein 6.0 - 8.5 g/dL 6.2 3.1(L) -  Total Bilirubin  0.0 - 1.2 mg/dL 0.3 0.5 -  Alkaline Phos 39 - 117 IU/L 82 27(L) -  AST 0 - 40 IU/L 35 18 -  ALT 0 - 44 IU/L 44 21 -   Lipid Panel     Component Value Date/Time   CHOL 226 (H) 04/30/2019 1633   TRIG 152 (H) 04/30/2019 1633   HDL 31 (L) 04/30/2019 1633   CHOLHDL 7.3 (H) 04/30/2019 1633   LDLCALC 167 (H) 04/30/2019 1633    CBC    Component Value Date/Time   WBC 6.5 04/30/2019 1633   WBC 16.7 (H) 04/23/2019 0645   RBC 3.60 (L) 04/30/2019 1633   RBC 1.78 (L) 04/23/2019 0645   HGB 10.5 (L) 04/30/2019 1633   HCT 33.0 (L) 04/30/2019 1633   PLT 367 04/30/2019 1633   MCV 92 04/30/2019 1633   MCH 29.2 04/30/2019 1633   MCH 31.5 04/23/2019 0645   MCHC 31.8 04/30/2019 1633   MCHC 35.4 04/23/2019 0645   RDW 14.3 04/30/2019 1633   LYMPHSABS 1.3 04/15/2019 0301   MONOABS 0.6 04/15/2019 0301   EOSABS 0.0 04/15/2019 0301   BASOSABS 0.0 04/15/2019 0301    ASSESSMENT AND PLAN: 1. Diabetes mellitus type 2 in nonobese (HCC) Blood sugars are good. I think we can discontinue the Lantus insulin and put him back on Amaryl 2 mg daily.  His sister will continue to monitor his blood sugars.  If they start going up the plan would be to increase the Amaryl to twice daily dosing. - POCT glucose (manual entry) - POCT glycosylated hemoglobin (Hb A1C) - glimepiride (AMARYL) 2 MG tablet; Take 1 tablet (2 mg total) by mouth daily before breakfast.  Dispense: 90 tablet; Refill: 3  2. Seizure disorder ([place]) No seizures since hospitalization.  Keep appointment with neurologist - levETIRAcetam (KEPPRA) 500 MG tablet; Take 1 tablet (500 mg total) by mouth 2 (two) times daily.  Dispense: 180 tablet; Refill: 5  3. Memory changes Definitely has some deficits here and probably will  not be able to return to his line of work.  Hopefully neurologist would be able to help in nailing down the diagnoses  4. PUD (peptic ulcer disease) Sister plans to schedule appointment with gastroenterologist for follow-up  5.  Anemia, unspecified type - CBC  6. Hyperlipidemia, unspecified hyperlipidemia type - atorvastatin (LIPITOR) 10 MG tablet; Take 1 tablet (10 mg total) by mouth daily.  Dispense: 90 tablet; Refill: 3    Patient was given the opportunity to ask questions.  Patient verbalized understanding of the plan and was able to repeat key elements of the plan.   Orders Placed This Encounter  Procedures  . CBC  . POCT glucose (manual entry)  . POCT glycosylated hemoglobin (Hb A1C)     Requested Prescriptions   Signed Prescriptions Disp Refills  . atorvastatin (LIPITOR) 10 MG tablet 90 tablet 3    Sig: Take 1 tablet (10 mg total) by mouth daily.  Marland Kitchen levETIRAcetam (KEPPRA) 500 MG tablet 180 tablet 5    Sig: Take 1 tablet (500 mg total) by mouth 2 (two) times daily.  Marland Kitchen glimepiride (AMARYL) 2 MG tablet 90 tablet 3    Sig: Take 1 tablet (2 mg total) by mouth daily before breakfast.    Return in about 3 months (around 08/22/2019).  Karle Plumber, MD, FACP

## 2019-05-25 NOTE — Patient Instructions (Signed)
Stop Lantus insulin. Start glimepiride 2 mg daily.  Continue to monitor blood sugars at least once a day.

## 2019-05-26 LAB — CBC
Hematocrit: 33.9 % — ABNORMAL LOW (ref 37.5–51.0)
Hemoglobin: 11.2 g/dL — ABNORMAL LOW (ref 13.0–17.7)
MCH: 28.9 pg (ref 26.6–33.0)
MCHC: 33 g/dL (ref 31.5–35.7)
MCV: 87 fL (ref 79–97)
Platelets: 211 10*3/uL (ref 150–450)
RBC: 3.88 x10E6/uL — ABNORMAL LOW (ref 4.14–5.80)
RDW: 12 % (ref 11.6–15.4)
WBC: 6.5 10*3/uL (ref 3.4–10.8)

## 2019-06-01 MED FILL — ATORVASTATIN 10 MG TABLET: 10 | 90 days supply | Qty: 90 | Fill #0

## 2019-06-01 MED FILL — FERROUS SULFATE 325 MG TAB: 325 (65 FE) | 90 days supply | Qty: 90 | Fill #1

## 2019-06-01 MED FILL — levETIRAcetam 500 MG TABS: 500 | 90 days supply | Qty: 180 | Fill #0

## 2019-06-01 MED FILL — GLIMEPIRIDE 2 MG TABS: 2 | 90 days supply | Qty: 90 | Fill #0

## 2019-06-05 ENCOUNTER — Encounter: Payer: Self-pay | Admitting: *Deleted

## 2019-06-05 MED FILL — PANTOPRAZOLE SOD DR 40 MG T: 40 | 90 days supply | Qty: 180 | Fill #1

## 2019-06-08 ENCOUNTER — Telehealth: Payer: Self-pay | Admitting: Internal Medicine

## 2019-06-08 NOTE — Telephone Encounter (Signed)
Spoke to patient's sister, [name]. Patient is currently scheduled to see Tye Savoy, our NP for follow up after hospitalization since patient's appointment with Dr Henrene Pastor is over 1 month away and patient had acute gi bleed in hospital but left AMA and probably needs closer follow up. Ms.Zamorano verbalizes understanding of this and indicates patient will come for appointment on 06/11/19 at 830 am.

## 2019-06-09 ENCOUNTER — Ambulatory Visit: Payer: Self-pay | Admitting: Internal Medicine

## 2019-06-11 ENCOUNTER — Ambulatory Visit (INDEPENDENT_AMBULATORY_CARE_PROVIDER_SITE_OTHER): Payer: Medicaid Other | Admitting: Nurse Practitioner

## 2019-06-11 ENCOUNTER — Encounter: Payer: Self-pay | Admitting: Nurse Practitioner

## 2019-06-11 VITALS — BP 118/86 | HR 76 | Temp 97.8°F | Ht 69.0 in | Wt 138.0 lb

## 2019-06-11 DIAGNOSIS — K922 Gastrointestinal hemorrhage, unspecified: Secondary | ICD-10-CM | POA: Diagnosis not present

## 2019-06-11 DIAGNOSIS — Z01818 Encounter for other preprocedural examination: Secondary | ICD-10-CM | POA: Diagnosis not present

## 2019-06-11 DIAGNOSIS — Z1211 Encounter for screening for malignant neoplasm of colon: Secondary | ICD-10-CM

## 2019-06-11 DIAGNOSIS — K279 Peptic ulcer, site unspecified, unspecified as acute or chronic, without hemorrhage or perforation: Secondary | ICD-10-CM

## 2019-06-11 MED ORDER — NA SULFATE-K SULFATE-MG SULF 17.5-3.13-1.6 GM/177ML PO SOLN: ORAL | 0 refills | Status: DC

## 2019-06-11 MED FILL — SUPREP BOWEL PREP KIT: 17.5-3.13-1 | 1 days supply | Qty: 354 | Fill #0

## 2019-06-11 NOTE — Patient Instructions (Signed)
If you are age 50 or older, your body mass index should be between 23-30. Your Body mass index is 20.38 kg/m. If this is out of the aforementioned range listed, please consider follow up with your Primary Care Provider.  If you are age 22 or younger, your body mass index should be between 19-25. Your Body mass index is 20.38 kg/m. If this is out of the aformentioned range listed, please consider follow up with your Primary Care Provider.   You have been scheduled for an endoscopy and colonoscopy. Please follow the written instructions given to you at your visit today. Please pick up your prep supplies at the pharmacy within the next 1-3 days. If you use inhalers (even only as needed), please bring them with you on the day of your procedure. Your physician has requested that you go to www.startemmi.com and enter the access code given to you at your visit today. This web site gives a general overview about your procedure. However, you should still follow specific instructions given to you by our office regarding your preparation for the procedure.  We have sent the following medications to your pharmacy for you to pick up at your convenience: Suprep  Continue Pantoprazole twice daily through the end of April then take once daily.  Thank you for choosing me and [facility].   Tye Savoy, NP

## 2019-06-11 NOTE — Progress Notes (Signed)
IMPRESSION and PLAN:    [name] is a 49 y.o. male with a pmh significant for, not necessarily limited to seizures, AKI, rhabdomyolysis, seizures, hypertension, DM 2  # Major upper GI bleed secondary to duodenal ulcers in January s/p endoclip placement for hemostasis.  Also found was a small clean based gastric ulcer. PUD NSAID induced? Clo test negative.  --He required 3 upper endoscopies within a 6-day period.  No bleeding or stigmata of bleeding at time of third EGD despite recurrent melena and hemodynamic instability.  --He is compliant with avoidance of NSAIDs now.  He is taking twice daily Protonix. Continue twice daily PPI through end of April then can reduce to once daily.  --Will arrange for follow-up EGD in 8 to 10 weeks for reevaluation of duodenal ulcers. --Call us if any recurrent black stools between now and time of next EGD    # Colon cancer screening.  --He has never had a colonoscopy. Furthermore, while he likely bled a third time during January admission, the 3rd EGD showed no active bleeding nor stigmata of recent bleeding so other source not excluded though seems unlikely --The risks and benefits of colonoscopy with possible polypectomy / biopsies were discussed with the patient and his sister ( caretaker) and they agree to proceed.        HPI:    Primary GI: Dr. Henrene Pastor  Chief complaint : Hospital follow-up for GI bleed.  No complaints  **History comes from the chart and patient  Patient is a 49 year old male here with his sister. Patient currently residing with his sister given some recent short-term memory loss related to illness in January.   [name] was admitted in December with AKI, metabolic encephalopathy, rhabdomyolysis, seizures.  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  He was treated with high-dose steroids, Vimpat.  While hospitalized patient developed melena with decline in  hemoglobin requiring transfusion  Inpatient EGD 04/17/19 by Dr. Henrene Pastor >>> duodenal ulcer status post endoscopic hemostatic therapy.  AKI required hemodialysis.  Condition improved, he was discharged home on 04/19/2019  Patient readmitted 04/21/2019 following a syncopal episode .  On arrival EMS found him hypotensive in the setting of large volume hematochezia.  Hemodynamic instability was corrected with IV fluids. Hemoglobin had declined again, requiring another blood transfusion.  He underwent a second EGD on 04/21/2019 with findings of a nonbleeding ( and no stigmata) gastric ulcer, blood in the duodenal bulb, two cratered duodenal ulcers, 1 with a visible vessel were found in the duodenal bulb. The largest lesion was 10 mm in largest dimension. There was a previously placed endoclip at the edge of the more distal ulcer (same ulcer that now has visible vessel)duodenal ulcers with a visible vessel, additional clips were placed.  On 04/23/2019 patient had recurrent melena with associated hemodynamic instability and another acute decline in hgb to 5.6.  Once transfused and stabilized patient underwent a third EGD 04/23/19 with findings of gastric erosions, 2 nonbleeding cratered duodenal ulcers with no stigmata of bleeding found in the duodenal bulb.  The more distal ulcer which had been previously treated contain 3 previously placed endoclips which remained intact.  No active bleeding or stigmata of bleeding was found.  Per sister, patient had been taking NSAIDs.  CLOtest negative.  Patient ended up leaving AMA later that day.  He is here with his sister for hospital follow-up.  He is taking daily iron.  No black stools.  Overall  doing well.  History mainly comes from sister, patient has short-term memory loss following recent hospital events.  Compliant with twice daily Protonix. Sister assures me patient is not taking any NSAIDs now  Data Reviewed:  Hemoglobin 05/25/2019 was 11.2  Review of systems:     No chest  pain, no SOB, no fevers, no urinary sx   Past Medical History:  Diagnosis Date  . Acute blood loss anemia   . Cirrhosis ([place])   . Diabetes mellitus, type 2 ([place])   . Duodenal ulcer   . Fatty liver   . Peptic ulcer disease   . Seizures ([place])     Patient's surgical history, family medical history, social history, medications and allergies were all reviewed in Epic   Creatinine clearance cannot be calculated (Patient's most recent lab result is older than the maximum 21 days allowed.)  Current Outpatient Medications  Medication Sig Dispense Refill  . acetaminophen (TYLENOL) 500 MG tablet Take 1 tablet (500 mg total) by mouth every 6 (six) hours as needed for mild pain, moderate pain, fever or headache.    Marland Kitchen atorvastatin (LIPITOR) 10 MG tablet Take 1 tablet (10 mg total) by mouth daily. 90 tablet 3  . ferrous sulfate 325 (65 FE) MG tablet Take 1 tablet (325 mg total) by mouth daily with breakfast. 60 tablet 1  . fexofenadine (ALLEGRA) 180 MG tablet Take 180 mg by mouth daily as needed for allergies or rhinitis.    Marland Kitchen glimepiride (AMARYL) 2 MG tablet Take 1 tablet (2 mg total) by mouth daily before breakfast. 90 tablet 3  . Insulin Pen Needle (PEN NEEDLES) 31G X 8 MM MISC UAD 100 each 6  . levETIRAcetam (KEPPRA) 500 MG tablet Take 1 tablet (500 mg total) by mouth 2 (two) times daily. 180 tablet 5  . pantoprazole (PROTONIX) 40 MG tablet Take 1 tablet (40 mg total) by mouth 2 (two) times daily. 60 tablet 6   No current facility-administered medications for this visit.    Physical Exam:     Temp 97.8 F (36.6 C)   Ht 5\' 9"  (1.753 m)   Wt 138 lb (62.6 kg)   BMI 20.38 kg/m   GENERAL:  Thin male in NAD PSYCH: : Cooperative CARDIAC:  RRR,  no peripheral edema PULM: Normal respiratory effort, lungs CTA bilaterally, no wheezing ABDOMEN:  Nondistended, soft, nontender. No obvious masses, no hepatomegaly,  normal bowel sounds SKIN:  turgor, no lesions seen Musculoskeletal:  Normal  muscle tone, normal strength NEURO: Alert, answers basic yes / no questions.   Tye Savoy , NP 06/11/2019, 8:39 AM

## 2019-06-12 NOTE — Progress Notes (Signed)
Assessment and plans reviewed  

## 2019-06-16 ENCOUNTER — Other Ambulatory Visit: Payer: Self-pay

## 2019-06-16 ENCOUNTER — Encounter: Payer: Self-pay | Admitting: Neurology

## 2019-06-16 ENCOUNTER — Telehealth (INDEPENDENT_AMBULATORY_CARE_PROVIDER_SITE_OTHER): Payer: Medicaid Other | Admitting: Neurology

## 2019-06-16 DIAGNOSIS — R569 Unspecified convulsions: Secondary | ICD-10-CM | POA: Diagnosis not present

## 2019-06-16 DIAGNOSIS — G9340 Encephalopathy, unspecified: Secondary | ICD-10-CM | POA: Diagnosis not present

## 2019-06-16 DIAGNOSIS — R9089 Other abnormal findings on diagnostic imaging of central nervous system: Secondary | ICD-10-CM

## 2019-06-16 DIAGNOSIS — G40909 Epilepsy, unspecified, not intractable, without status epilepticus: Secondary | ICD-10-CM

## 2019-06-16 NOTE — Progress Notes (Signed)
Virtual Visit via Video Note The purpose of this virtual visit is to provide medical care while limiting exposure to the novel coronavirus.    Consent was obtained for video visit:  Yes.   Answered questions that patient had about telehealth interaction:  Yes.   I discussed the limitations, risks, security and privacy concerns of performing an evaluation and management service by telemedicine. I also discussed with the patient that there may be a patient responsible charge related to this service. The patient expressed understanding and agreed to proceed.  Pt location: Home Physician Location: office Name of referring provider:  Ladell Pier, MD I connected with [name] at patients initiation/request on 06/16/2019 at  1:00 PM EST by video enabled telemedicine application and verified that I am speaking with the correct person using two identifiers. Pt MRN:  [id number] Pt DOB:  02-03-1971 Video Participants:  [name];  Elmarie Shiley (sister)   History of Present Illness:  This is a 49 year old right-handed man with a history of hyperlipidemia, diabetes, presenting for evaluation of seizure and cognitive changes. His sister [name] is present during the visit to provide additional information due to continued cognitive changes. He was in his usual state of health until the week of Christmas 2020 when he had a toothache. [name] reports he has very bad teeth, he took Tylenol, then told her he was not feeling good on 12/7. He was talking fine, recognizing his sister, he was confused and out of it, so they brought him to the ER. [name] reported marijuana use, no alcohol or other illicit drugs. His BP was 156/104, HR 112. He was found to be in renal failure with creatinine of 10.38, AST 22, ALT 156. UDS positive for THC. MRI brain without contrast done 04/07/19 showed abnormal signal in the medial temporal lobe/hippocampus and posteromedial left thalamus, mild associated parenchymal swelling, chronic  blood products along the right parietal lobe. He had an 18-hour EEG showing continuous rhythmic 2-3 Hz delta activity in the right temporoparietal region, at times sharply contoured. There was also slowing on the left hemisphere, and intermittent rhythmic generalized delta slowing. During his hospital stay, he had a generalized tonic-clonic seizure on 04/10/19, then another with note of left gaze deviation, left upper extremity extended, unresponsive. EEG showed diffuse slowing and triphasic waves. Repeat EEG showed generalized slowing, triphasic waves. He had a lumbar puncture with WBC 3, protein 53, glucose 62. Negative HSV, VZV, ACE. Negative anti-Jo, dsDNA, SS-A, SS-B, SCL 70, anti-SM Ab. Repeat MRI brain done 04/15/19 showed new changes with fairly extensive subcortical greater than cortical signal abnormality in the bilateral frontoparietal lboes, occipital lobes, and posterior temporal lobes. Patchy cortical restricted diffuse suspicious for acute/early subacute infarcts. There was also new signal change in the bilateral cerebellum and patchy restricted diffusion suspicious for acute/early subacute infarcts. 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 He received 5 days of IV Solu-Medrol. It was noted that his BP on admission was 156/104, as high as 219/98 on 1/4. EEG on 12/29 showed diffuse delta slowing in the right temporoparietal region, slowing on the left hemisphere.   He has a prior history of seizures, his sister reports a few seizures while he was teething. In 2006/2007 he had a couple of seizure where his eyes rolled back with stiffening and shaking. He was evaluated by Dr. Gaynell Face in 2007. At that time, he woke up dizzy with rotatory nystagmus, headache, followed by left focal motor seizure.  LP at that time RBCs 11,320, likely a traumatic tap, however due to sudden onset headache and possible seizure, catheter angiogram was done, which was normal. EEG  in 2007 reported occasional sharp transients in the left frontotemporal region. MRI brain no acute changes.   His sister reports that "his memory just went." He did not recognize her when she visited him in the hospital. Physically he is getting better, his sister makes sure he eats regularly. Cognitively he has not improved, they need to repeat and reiterate themselves to him. No staring/unresponsiveness, but he has delayed responses, like he has to overprocess information. He is able to bathe and dress himself, but she has to remind him to shower because he thinks he just had one. Most of the time he is calm, he occasionally gets very agitated for no reason, then calms down. No paranoia or hallucinations. When he initially came home, he did not remember he had a dog and kept calling him "dog," so afraid of him. It took several weeks to be comfortable around him. She reports that he was not taking any medications prior to hospital admission. He used to smoke a lot of weed and "swears up and down that he had not used in years," but [name] says he used it daily. No alcohol use. A few days after hospital discharge, his family found him on the floor, he was hypotensive and had soiled his clothes, then was standing in a pool of blood. He had acute GI bleed secondary to PUD.   He feels his memory is "good, bad sometimes." He has some dizziness, drowsiness, and fatigue. Balance is bad in the mornings. He has some tingling in his legs. He has occasional headaches in the back of his head with throbbing, sensitivity to lights/sounds. No nausea/vomiting. Vision is sometimes blurred. He has occasional neck pain. No bowel/bladder dysfunction. His sister reports his legs are twitching constantly, decreasing when he lifts his legs up. [name] has not witnessed any seizures, he is taking Levetiracetam 500mg  BID.     Current Outpatient Medications on File Prior to Visit  Medication Sig Dispense Refill  . acetaminophen (TYLENOL)  500 MG tablet Take 1 tablet (500 mg total) by mouth every 6 (six) hours as needed for mild pain, moderate pain, fever or headache. (Patient not taking: Reported on 06/15/2019)    . atorvastatin (LIPITOR) 10 MG tablet Take 1 tablet (10 mg total) by mouth daily. 90 tablet 3  . ferrous sulfate 325 (65 FE) MG tablet Take 1 tablet (325 mg total) by mouth daily with breakfast. 60 tablet 1  . fexofenadine (ALLEGRA) 180 MG tablet Take 180 mg by mouth daily as needed for allergies or rhinitis.    Marland Kitchen glimepiride (AMARYL) 2 MG tablet Take 1 tablet (2 mg total) by mouth daily before breakfast. 90 tablet 3  . levETIRAcetam (KEPPRA) 500 MG tablet Take 1 tablet (500 mg total) by mouth 2 (two) times daily. 180 tablet 5  . pantoprazole (PROTONIX) 40 MG tablet Take 1 tablet (40 mg total) by mouth 2 (two) times daily. 60 tablet 6   No current facility-administered medications on file prior to visit.     PAST MEDICAL HISTORY: Past Medical History:  Diagnosis Date  . Acute blood loss anemia   . Cirrhosis ([place])   . Diabetes mellitus, type 2 ([place])   . Duodenal ulcer   . Fatty liver   . Peptic ulcer disease   . Seizures ([place])     PAST SURGICAL  HISTORY: Past Surgical History:  Procedure Laterality Date  . BIOPSY  04/17/2019   Procedure: BIOPSY;  Surgeon: Irene Shipper, MD;  Location: [facility] ENDOSCOPY;  Service: Endoscopy;;  . ESOPHAGOGASTRODUODENOSCOPY Left 04/21/2019   Procedure: ESOPHAGOGASTRODUODENOSCOPY (EGD);  Surgeon: Jerene Bears, MD;  Location[facility] ENDOSCOPY;  Service: Gastroenterology;  Laterality: Left;  . ESOPHAGOGASTRODUODENOSCOPY (EGD) WITH PROPOFOL N/A 04/17/2019   Procedure: ESOPHAGOGASTRODUODENOSCOPY (EGD) WITH PROPOFOL;  Surgeon: Irene Shipper, MD;  Location: [facility] ENDOSCOPY;  Service: Endoscopy;  Laterality: N/A;  . ESOPHAGOGASTRODUODENOSCOPY (EGD) WITH PROPOFOL N/A 04/23/2019   Procedure: ESOPHAGOGASTRODUODENOSCOPY (EGD) WITH PROPOFOL;  Surgeon: Jerene Bears, MD;  Location: [facility] ENDOSCOPY;  Service:  Gastroenterology;  Laterality: N/A;  . HEMOSTASIS CLIP PLACEMENT  04/17/2019   Procedure: HEMOSTASIS CLIP PLACEMENT;  Surgeon: Irene Shipper, MD;  Location: [facility] ENDOSCOPY;  Service: Endoscopy;;  . HEMOSTASIS CLIP PLACEMENT  04/21/2019   Procedure: HEMOSTASIS CLIP PLACEMENT;  Surgeon: Jerene Bears, MD;  Location: [facility] ENDOSCOPY;  Service: Gastroenterology;;  . IR FLUORO GUIDE CV LINE RIGHT  04/10/2019  . IR REMOVAL TUN CV CATH W/O FL  04/17/2019  . IR US GUIDE VASC ACCESS RIGHT  04/10/2019  . SUBMUCOSAL INJECTION  04/17/2019   Procedure: SUBMUCOSAL INJECTION;  Surgeon: Irene Shipper, MD;  Location: [facility] ENDOSCOPY;  Service: Endoscopy;;    MEDICATIONS: Current Outpatient Medications on File Prior to Visit  Medication Sig Dispense Refill  . acetaminophen (TYLENOL) 500 MG tablet Take 1 tablet (500 mg total) by mouth every 6 (six) hours as needed for mild pain, moderate pain, fever or headache. (Patient not taking: Reported on 06/15/2019)    . atorvastatin (LIPITOR) 10 MG tablet Take 1 tablet (10 mg total) by mouth daily. 90 tablet 3  . ferrous sulfate 325 (65 FE) MG tablet Take 1 tablet (325 mg total) by mouth daily with breakfast. 60 tablet 1  . fexofenadine (ALLEGRA) 180 MG tablet Take 180 mg by mouth daily as needed for allergies or rhinitis.    Marland Kitchen glimepiride (AMARYL) 2 MG tablet Take 1 tablet (2 mg total) by mouth daily before breakfast. 90 tablet 3  . levETIRAcetam (KEPPRA) 500 MG tablet Take 1 tablet (500 mg total) by mouth 2 (two) times daily. 180 tablet 5  . pantoprazole (PROTONIX) 40 MG tablet Take 1 tablet (40 mg total) by mouth 2 (two) times daily. 60 tablet 6   No current facility-administered medications on file prior to visit.    ALLERGIES: Allergies  Allergen Reactions  . Penicillins Hives and Other (See Comments)    Childhood allergy  Did it involve swelling of the face/tongue/throat, SOB, or low BP? Unknown Did it involve sudden or severe rash/hives, skin peeling, or any reaction on the  inside of your mouth or nose? Unknown Did you need to seek medical attention at a hospital or doctor's office? Unknown When did it last happen?Pt was a child If all above answers are "NO", may proceed with cephalosporin use. Sister states he just breaks out in hives unsure about other reactions.      FAMILY HISTORY: Family History  Problem Relation Age of Onset  . Diabetes Mother   . Diabetes Sister   . Esophageal cancer Neg Hx   . Stomach cancer Neg Hx   . Colon cancer Neg Hx   . Pancreatic cancer Neg Hx    Observations/Objective:   GEN:  The patient appears stated age and is in NAD. Flat affect  Neurological examination: Patient is awake, alert, oriented to  person, state. No aphasia or dysarthria. Reduced fluency, able to follow commands. Remote and recent memory impaired. SLUMS score 9/30. [place] Mental Exam 06/16/2019  Weekday Correct 0  Current year 0  What state are we in? 1  Amount spent 0  Amount left 0  # of Animals 3  5 objects recall 0  Number series 1  Hour markers 2  Time correct 0  Placed X in triangle correctly 1  Largest Figure 1  Name of male 0  Date back to work 0  Type of work 0  State she lived in 0  Total score 9   Cranial nerves: Extraocular movements intact with no nystagmus. No facial asymmetry. Motor: moves all extremities symmetrically, at least anti-gravity x 4. No incoordination on finger to nose testing. Gait: narrow-based and steady, able to tandem walk adequately. Negative Romberg test. His sister reports leg twitching not clearly visible on video.   Assessment and Plan:   This is a 49 year old right-handed man with a history of hyperlipidemia, diabetes, remote seizures, in his usual state of health until the end of December 2020. He was found to be in renal failure, initial MRI brain showed changes in the left medial temporal lobe/hippocampus and left thalamus. During his hospital stay, he had a GTC with left gaze  deviation. Repeat MRI brain a few days later showed extensive signal abnormality in the subcortical bilateral frontoparietal lobes, occipital lobes, and posterior temporal lobes, with restricted diffusion suspicious for acute/early subacute infarcts. 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 His lumbar puncture was unremarkable, autoimmune panel sent to [facility] not available for review. He received 5-days of IV Solumedrol. He has not had any further seizures since then, however continues to have cognitive difficulties. Continue Levetiracetam 500mg  BID. SLUMS score today 9/30. Etiology of symptoms unclear, repeat interval MRI brain with and without contrast and EEG will be ordered. Follow-up on autoimmune panel. Discussed speech/cognitive therapy, his sister would like to proceed. She is awaiting news regarding insurance and will reach out to his case worker and update [place] so we can proceed with follow-up testing. Continue supportive care, he does not drive.   Follow Up Instructions:   -I discussed the assessment and treatment plan with the patient. The patient was provided an opportunity to ask questions and all were answered. The patient agreed with the plan and demonstrated an understanding of the instructions.   The patient was advised to call back or seek an in-person evaluation if the symptoms worsen or if the condition fails to improve as anticipated.    Cameron Sprang, MD

## 2019-07-14 ENCOUNTER — Ambulatory Visit: Payer: Self-pay | Admitting: Internal Medicine

## 2019-07-14 MED FILL — SUPREP BOWEL PREP KIT: 17.5-3.13-1 | 1 days supply | Qty: 354 | Fill #0

## 2019-08-05 ENCOUNTER — Ambulatory Visit (INDEPENDENT_AMBULATORY_CARE_PROVIDER_SITE_OTHER): Payer: Self-pay

## 2019-08-05 ENCOUNTER — Other Ambulatory Visit: Payer: Self-pay

## 2019-08-05 ENCOUNTER — Other Ambulatory Visit: Payer: Self-pay | Admitting: Internal Medicine

## 2019-08-05 DIAGNOSIS — Z1159 Encounter for screening for other viral diseases: Secondary | ICD-10-CM

## 2019-08-05 LAB — SARS CORONAVIRUS 2 (TAT 6-24 HRS): SARS Coronavirus 2: NEGATIVE

## 2019-08-07 ENCOUNTER — Other Ambulatory Visit: Payer: Self-pay

## 2019-08-07 ENCOUNTER — Encounter: Payer: Self-pay | Admitting: Internal Medicine

## 2019-08-07 ENCOUNTER — Ambulatory Visit (AMBULATORY_SURGERY_CENTER): Payer: Medicaid Other | Admitting: Internal Medicine

## 2019-08-07 VITALS — BP 112/72 | HR 74 | Temp 96.9°F | Resp 10 | Ht 69.0 in | Wt 138.0 lb

## 2019-08-07 DIAGNOSIS — Z8719 Personal history of other diseases of the digestive system: Secondary | ICD-10-CM

## 2019-08-07 DIAGNOSIS — Z1211 Encounter for screening for malignant neoplasm of colon: Secondary | ICD-10-CM

## 2019-08-07 DIAGNOSIS — K279 Peptic ulcer, site unspecified, unspecified as acute or chronic, without hemorrhage or perforation: Secondary | ICD-10-CM

## 2019-08-07 MED ORDER — SODIUM CHLORIDE 0.9 % IV SOLN: 500.0000 mL | Freq: Once | INTRAVENOUS | Status: DC

## 2019-08-07 NOTE — Op Note (Signed)
[place] Patient Name: [name] Procedure Date: 08/07/2019 11:20 AM MRN: [id number] Endoscopist: Docia Chuck. Henrene Pastor , MD Age: 49 Referring MD:  Date of Birth: 06/08/1970 Gender: Male Account #: [id number] Procedure:                Upper GI endoscopy Indications:              Follow-up of peptic ulcer Medicines:                Monitored Anesthesia Care Procedure:                Pre-Anesthesia Assessment:                           - Prior to the procedure, a History and Physical                            was performed, and patient medications and                            allergies were reviewed. The patient's tolerance of                            previous anesthesia was also reviewed. The risks                            and benefits of the procedure and the sedation                            options and risks were discussed with the patient.                            All questions were answered, and informed consent                            was obtained. Prior Anticoagulants: The patient has                            taken no previous anticoagulant or antiplatelet                            agents. After reviewing the risks and benefits, the                            patient was deemed in satisfactory condition to                            undergo the procedure.                           After obtaining informed consent, the endoscope was                            passed under direct vision. Throughout the  procedure, the patient's blood pressure, pulse, and                            oxygen saturations were monitored continuously. The                            Endoscope was introduced through the mouth, and                            advanced to the second part of duodenum. The upper                            GI endoscopy was accomplished without difficulty.                            The patient tolerated the procedure well. Scope  In: Scope Out: Findings:                 The esophagus was normal.                           The stomach was normal. Prior ulcer has healed.                           The examined duodenum was normal. Prior ulcers have                            healed.                           The cardia and gastric fundus were normal on                            retroflexion. Complications:            No immediate complications. Estimated Blood Loss:     Estimated blood loss: none. Impression:               - Normal esophagus.                           - Normal stomach.                           - Normal examined duodenum.                           - No specimens collected. Recommendation:           - Patient has a contact number available for                            emergencies. The signs and symptoms of potential                            delayed complications were discussed with the  patient. Return to normal activities tomorrow.                            Written discharge instructions were provided to the                            patient.                           - Resume previous diet.                           - Continue present medications.                           - CONTINUE PANTOPRAZOLE 40 mg DAILY INDEFINITELY                           - Return to the care of your primary provider                           [place] PHONE [place]. Henrene Pastor, MD 08/07/2019 11:48:10 AM This report has been signed electronically.

## 2019-08-07 NOTE — Progress Notes (Signed)
Pt's states no medical or surgical changes since previsit or office visit.  Temp- June Vitals- Donna 

## 2019-08-07 NOTE — Patient Instructions (Signed)
Impression/Recommendations:  Resume previous diet. Continue present medications. Continue Pantoprazole 40 mg daily indefinitely.  Repeat colonoscopy in 10 years for screening.  Return to the care of your primary provider.  YOU HAD AN ENDOSCOPIC PROCEDURE TODAY AT [facility]:   Refer to the procedure report that was given to you for any specific questions about what was found during the examination.  If the procedure report does not answer your questions, please call your gastroenterologist to clarify.  If you requested that your care partner not be given the details of your procedure findings, then the procedure report has been included in a sealed envelope for you to review at your convenience later.  YOU SHOULD EXPECT: Some feelings of bloating in the abdomen. Passage of more gas than usual.  Walking can help get rid of the air that was put into your GI tract during the procedure and reduce the bloating. If you had a lower endoscopy (such as a colonoscopy or flexible sigmoidoscopy) you may notice spotting of blood in your stool or on the toilet paper. If you underwent a bowel prep for your procedure, you may not have a normal bowel movement for a few days.  Please Note:  You might notice some irritation and congestion in your nose or some drainage.  This is from the oxygen used during your procedure.  There is no need for concern and it should clear up in a day or so.  SYMPTOMS TO REPORT IMMEDIATELY:   Following lower endoscopy (colonoscopy or flexible sigmoidoscopy):  Excessive amounts of blood in the stool  Significant tenderness or worsening of abdominal pains  Swelling of the abdomen that is new, acute  Fever of 100F or higher   Following upper endoscopy (EGD)  Vomiting of blood or coffee ground material  New chest pain or pain under the shoulder blades  Painful or persistently difficult swallowing  New shortness of breath  Fever of 100F or higher  Black,  tarry-looking stools  For urgent or emergent issues, a gastroenterologist can be reached at any hour by calling [phone]. Do not use MyChart messaging for urgent concerns.    DIET:  We do recommend a small meal at first, but then you may proceed to your regular diet.  Drink plenty of fluids but you should avoid alcoholic beverages for 24 hours.  ACTIVITY:  You should plan to take it easy for the rest of today and you should NOT DRIVE or use heavy machinery until tomorrow (because of the sedation medicines used during the test).    FOLLOW UP: Our staff will call the number listed on your records 48-72 hours following your procedure to check on you and address any questions or concerns that you may have regarding the information given to you following your procedure. If we do not reach you, we will leave a message.  We will attempt to reach you two times.  During this call, we will ask if you have developed any symptoms of COVID 19. If you develop any symptoms (ie: fever, flu-like symptoms, shortness of breath, cough etc.) before then, please call [phone].  If you test positive for Covid 19 in the 2 weeks post procedure, please call and report this information to [place].    If any biopsies were taken you will be contacted by phone or by letter within the next 1-3 weeks.  Please call us at [phone] if you have not heard about the biopsies in 3 weeks.  SIGNATURES/CONFIDENTIALITY: You and/or your care partner have signed paperwork which will be entered into your electronic medical record.  These signatures attest to the fact that that the information above on your After Visit Summary has been reviewed and is understood.  Full responsibility of the confidentiality of this discharge information lies with you and/or your care-partner.

## 2019-08-07 NOTE — Op Note (Addendum)
[facility] Patient Name: [name] Procedure Date: 08/07/2019 11:21 AM MRN: [id number] Endoscopist: Docia Chuck. Henrene Pastor , MD Age: 49 Referring MD:  Date of Birth: [birth year] Gender: Male Account #: [id number] Procedure:                Colonoscopy Indications:              Screening for colorectal malignant neoplasm Medicines:                Monitored Anesthesia Care Procedure:                Pre-Anesthesia Assessment:                           - Prior to the procedure, a History and Physical                            was performed, and patient medications and                            allergies were reviewed. The patient's tolerance of                            previous anesthesia was also reviewed. The risks                            and benefits of the procedure and the sedation                            options and risks were discussed with the patient.                            All questions were answered, and informed consent                            was obtained. Prior Anticoagulants: The patient has                            taken no previous anticoagulant or antiplatelet                            agents. ASA Grade Assessment: II - A patient with                            mild systemic disease. After reviewing the risks                            and benefits, the patient was deemed in                            satisfactory condition to undergo the procedure.                           After obtaining informed consent, the colonoscope  was passed under direct vision. Throughout the                            procedure, the patient's blood pressure, pulse, and                            oxygen saturations were monitored continuously. The                            Colonoscope was introduced through the anus and                            advanced to the the cecum, identified by                            appendiceal orifice and  ileocecal valve. The                            ileocecal valve, appendiceal orifice, and rectum                            were photographed. The quality of the bowel                            preparation was excellent. The colonoscopy was                            performed without difficulty. The patient tolerated                            the procedure well. The bowel preparation used was                            SUPREP via split dose instruction. Scope In: 11:28:03 AM Scope Out: 11:36:48 AM Scope Withdrawal Time: 0 hours 6 minutes 4 seconds  Total Procedure Duration: 0 hours 8 minutes 45 seconds  Findings:                 The entire examined colon appeared normal on direct                            and retroflexion views. Complications:            No immediate complications. Estimated blood loss:                            None. Estimated Blood Loss:     Estimated blood loss: none. Impression:               - The entire examined colon is normal on direct and                            retroflexion views.                           - No specimens collected.  Recommendation:           - Repeat colonoscopy in 10 years for screening                            purposes.                           - Patient has a contact number available for                            emergencies. The signs and symptoms of potential                            delayed complications were discussed with the                            patient. Return to normal activities tomorrow.                            Written discharge instructions were provided to the                            patient.                           - Resume previous diet.                           - Continue present medications. Docia Chuck. Henrene Pastor, MD 08/07/2019 11:38:53 AM This report has been signed electronically.

## 2019-08-07 NOTE — Progress Notes (Signed)
A/ox3, pleased with MAC, report to RN 

## 2019-08-11 ENCOUNTER — Telehealth: Payer: Self-pay | Admitting: *Deleted

## 2019-08-11 NOTE — Telephone Encounter (Signed)
  Follow up Call-  Call back number 08/07/2019  Post procedure Call Back phone  # [phone]  Permission to leave phone message Yes  Some recent data might be hidden     Patient questions:  Do you have a fever, pain , or abdominal swelling? No. Pain Score  0 *  Have you tolerated food without any problems? Yes.    Have you been able to return to your normal activities? Yes.    Do you have any questions about your discharge instructions: Diet   No. Medications  No. Follow up visit  No.  Do you have questions or concerns about your Care? No.  Actions: * If pain score is 4 or above: No action needed, pain <4. 1. Have you developed a fever since your procedure? No   2.   Have you had an respiratory symptoms (SOB or cough) since your procedure? No   3.   Have you tested positive for COVID 19 since your procedure no  4.   Have you had any family members/close contacts diagnosed with the COVID 19 since your procedure?  no   If yes to any of these questions please route to Joylene John, RN and Erenest Rasher, RN

## 2019-08-20 ENCOUNTER — Telehealth: Payer: Self-pay | Admitting: Neurology

## 2019-08-20 ENCOUNTER — Other Ambulatory Visit: Payer: Self-pay

## 2019-08-20 DIAGNOSIS — R9089 Other abnormal findings on diagnostic imaging of central nervous system: Secondary | ICD-10-CM

## 2019-08-20 DIAGNOSIS — G40909 Epilepsy, unspecified, not intractable, without status epilepticus: Secondary | ICD-10-CM

## 2019-08-20 DIAGNOSIS — R569 Unspecified convulsions: Secondary | ICD-10-CM

## 2019-08-20 DIAGNOSIS — G9340 Encephalopathy, unspecified: Secondary | ICD-10-CM

## 2019-08-20 NOTE — Telephone Encounter (Signed)
Spoke to pt sister informed her that MRI brain with and without contrast and routine EEG

## 2019-08-20 NOTE — Telephone Encounter (Signed)
Patient's sister called said the patient now has Medicaid coverage. She'd like to proceed with scheduling any tests the patient may need. She'd like a call back to confirm which tests are needed.

## 2019-08-20 NOTE — Telephone Encounter (Signed)
Pls order MRI brain with and without contrast and routine EEG, thanks!

## 2019-08-24 ENCOUNTER — Ambulatory Visit: Payer: Medicaid Other | Attending: Internal Medicine | Admitting: Internal Medicine

## 2019-08-24 ENCOUNTER — Other Ambulatory Visit: Payer: Self-pay

## 2019-08-24 ENCOUNTER — Encounter: Payer: Self-pay | Admitting: Internal Medicine

## 2019-08-24 ENCOUNTER — Ambulatory Visit (HOSPITAL_BASED_OUTPATIENT_CLINIC_OR_DEPARTMENT_OTHER): Payer: Medicaid Other | Admitting: Pharmacist

## 2019-08-24 VITALS — BP 130/95 | HR 89 | Temp 97.5°F | Resp 16 | Wt 157.6 lb

## 2019-08-24 DIAGNOSIS — Z79899 Other long term (current) drug therapy: Secondary | ICD-10-CM | POA: Diagnosis not present

## 2019-08-24 DIAGNOSIS — D649 Anemia, unspecified: Secondary | ICD-10-CM | POA: Insufficient documentation

## 2019-08-24 DIAGNOSIS — R030 Elevated blood-pressure reading, without diagnosis of hypertension: Secondary | ICD-10-CM | POA: Diagnosis not present

## 2019-08-24 DIAGNOSIS — K760 Fatty (change of) liver, not elsewhere classified: Secondary | ICD-10-CM | POA: Insufficient documentation

## 2019-08-24 DIAGNOSIS — E785 Hyperlipidemia, unspecified: Secondary | ICD-10-CM | POA: Insufficient documentation

## 2019-08-24 DIAGNOSIS — E119 Type 2 diabetes mellitus without complications: Secondary | ICD-10-CM

## 2019-08-24 DIAGNOSIS — Z833 Family history of diabetes mellitus: Secondary | ICD-10-CM | POA: Insufficient documentation

## 2019-08-24 DIAGNOSIS — G40909 Epilepsy, unspecified, not intractable, without status epilepticus: Secondary | ICD-10-CM | POA: Diagnosis not present

## 2019-08-24 DIAGNOSIS — Z23 Encounter for immunization: Secondary | ICD-10-CM

## 2019-08-24 DIAGNOSIS — I10 Essential (primary) hypertension: Secondary | ICD-10-CM | POA: Diagnosis not present

## 2019-08-24 DIAGNOSIS — K279 Peptic ulcer, site unspecified, unspecified as acute or chronic, without hemorrhage or perforation: Secondary | ICD-10-CM | POA: Diagnosis not present

## 2019-08-24 DIAGNOSIS — R413 Other amnesia: Secondary | ICD-10-CM

## 2019-08-24 DIAGNOSIS — E1142 Type 2 diabetes mellitus with diabetic polyneuropathy: Secondary | ICD-10-CM | POA: Diagnosis not present

## 2019-08-24 DIAGNOSIS — Z880 Allergy status to penicillin: Secondary | ICD-10-CM | POA: Diagnosis not present

## 2019-08-24 DIAGNOSIS — Z7984 Long term (current) use of oral hypoglycemic drugs: Secondary | ICD-10-CM | POA: Diagnosis not present

## 2019-08-24 LAB — GLUCOSE, POCT (MANUAL RESULT ENTRY): POC Glucose: 135 mg/dl — AB (ref 70–99)

## 2019-08-24 LAB — POCT GLYCOSYLATED HEMOGLOBIN (HGB A1C): HbA1c, POC (controlled diabetic range): 6.6 % (ref 0.0–7.0)

## 2019-08-24 NOTE — Progress Notes (Signed)
Patient presents for vaccination against strep pneumo per orders of Dr. Wynetta Emery. Consent given. Counseling provided. No contraindications exists. Vaccine administered without incident.

## 2019-08-24 NOTE — Progress Notes (Signed)
Patient ID: [name], male    DOB: Jul 29, 1970  MRN: [id number]  CC: Diabetes   Subjective: [name] is a 49 y.o. male who presents for chronic ds management.  Sister is with him and provides most of the history. His concerns today include:  Pt with hx of DM type 2, Sz disorder, PUD (GIB 04/2019), HTN, fatty liver, HL  PUD: Saw Dr. Henrene Pastor.  Had EGD and colonoscopy.  Colonoscopy was normal.  EGD revealed that ulcer has healed.  Advised to remain on Protonix lifelong.  SZ: No seizures since last visit.  Compliant with Keppra.  Memory remains poor.  He has seen the neurologist Dr. Delice Lesch.  She has ordered repeat imaging on his head and plans to refer him for some therapy.  He scored 9 on the Mini-Mental status exam when he saw the neurologist.  He has been approved for Medicaid.  DM: His sister stopped checking BS because pt noncompliant with diet.  She reports that he eats any and everything.  He has gained 20 pounds since March.  Not getting in any exercise.  He prefers to eat and then sleep. No blurred vision.  Overdue for eye exam Results for orders placed or performed in visit on 08/24/19  POCT glucose (manual entry)  Result Value Ref Range   POC Glucose 135 (A) 70 - 99 mg/dl  POCT glycosylated hemoglobin (Hb A1C)  Result Value Ref Range   Hemoglobin A1C     HbA1c POC (<> result, manual entry)     HbA1c, POC (prediabetic range)     HbA1c, POC (controlled diabetic range) 6.6 0.0 - 7.0 %    HM:  COVID-19 Pfizer vaccine 3/18 and 07/20/2019 Patient Active Problem List   Diagnosis Date Noted  . Memory changes 05/25/2019  . Non-traumatic rhabdomyolysis   . Acute GI bleeding 04/21/2019  . Diabetes mellitus type 2 in nonobese ([place]) 04/21/2019  . Seizures ([place]) 04/19/2019  . Acute blood loss anemia   . Duodenal ulcer hemorrhage   . Acute gastric ulcer without hemorrhage or perforation   . Acute renal failure (ARF) ([place]) 04/05/2019  . Acute metabolic encephalopathy 29/47/6546     Current Outpatient Medications on File Prior to Visit  Medication Sig Dispense Refill  . acetaminophen (TYLENOL) 500 MG tablet Take 1 tablet (500 mg total) by mouth every 6 (six) hours as needed for mild pain, moderate pain, fever or headache. (Patient not taking: Reported on 06/15/2019)    . atorvastatin (LIPITOR) 10 MG tablet Take 1 tablet (10 mg total) by mouth daily. 90 tablet 3  . ferrous sulfate 325 (65 FE) MG tablet Take 1 tablet (325 mg total) by mouth daily with breakfast. 60 tablet 1  . fexofenadine (ALLEGRA) 180 MG tablet Take 180 mg by mouth daily as needed for allergies or rhinitis.    Marland Kitchen glimepiride (AMARYL) 2 MG tablet Take 1 tablet (2 mg total) by mouth daily before breakfast. 90 tablet 3  . levETIRAcetam (KEPPRA) 500 MG tablet Take 1 tablet (500 mg total) by mouth 2 (two) times daily. 180 tablet 5  . pantoprazole (PROTONIX) 40 MG tablet Take 1 tablet (40 mg total) by mouth 2 (two) times daily. 60 tablet 6   No current facility-administered medications on file prior to visit.    Allergies  Allergen Reactions  . Penicillins Hives and Other (See Comments)    Childhood allergy  Did it involve swelling of the face/tongue/throat, SOB, or low BP? Unknown Did it involve  sudden or severe rash/hives, skin peeling, or any reaction on the inside of your mouth or nose? Unknown Did you need to seek medical attention at a hospital or doctor's office? Unknown When did it last happen?Pt was a child If all above answers are "NO", may proceed with cephalosporin use. Sister states he just breaks out in hives unsure about other reactions.      Social History   Socioeconomic History  . Marital status: Single    Spouse name: Not on file  . Number of children: Not on file  . Years of education: Not on file  . Highest education level: Not on file  Occupational History  . Not on file  Tobacco Use  . Smoking status: Never Smoker  . Smokeless tobacco: Never Used  Substance and  Sexual Activity  . Alcohol use: Not Currently  . Drug use: Not Currently    Types: Marijuana    Comment: 5 times per month (per 04/21/19 hospital note)  . Sexual activity: Not on file  Other Topics Concern  . Not on file  Social History Narrative  . Not on file   Social Determinants of Health   Financial Resource Strain:   . Difficulty of Paying Living Expenses:   Food Insecurity:   . Worried About Charity fundraiser in the Last Year:   . Arboriculturist in the Last Year:   Transportation Needs:   . Film/video editor (Medical):   Marland Kitchen Lack of Transportation (Non-Medical):   Physical Activity:   . Days of Exercise per Week:   . Minutes of Exercise per Session:   Stress:   . Feeling of Stress :   Social Connections:   . Frequency of Communication with Friends and Family:   . Frequency of Social Gatherings with Friends and Family:   . Attends Religious Services:   . Active Member of Clubs or Organizations:   . Attends Archivist Meetings:   Marland Kitchen Marital Status:   Intimate Partner Violence:   . Fear of Current or Ex-Partner:   . Emotionally Abused:   Marland Kitchen Physically Abused:   . Sexually Abused:     Family History  Problem Relation Age of Onset  . Diabetes Mother   . Diabetes Sister   . Esophageal cancer Neg Hx   . Stomach cancer Neg Hx   . Colon cancer Neg Hx   . Pancreatic cancer Neg Hx     Past Surgical History:  Procedure Laterality Date  . BIOPSY  04/17/2019   Procedure: BIOPSY;  Surgeon: Irene Shipper, MD;  Location: [facility] ENDOSCOPY;  Service: Endoscopy;;  . ESOPHAGOGASTRODUODENOSCOPY Left 04/21/2019   Procedure: ESOPHAGOGASTRODUODENOSCOPY (EGD);  Surgeon: Jerene Bears, MD;  Location: [facility] ENDOSCOPY;  Service: Gastroenterology;  Laterality: Left;  . ESOPHAGOGASTRODUODENOSCOPY (EGD) WITH PROPOFOL N/A 04/17/2019   Procedure: ESOPHAGOGASTRODUODENOSCOPY (EGD) WITH PROPOFOL;  Surgeon: Irene Shipper, MD;  Location: [facility] ENDOSCOPY;  Service: Endoscopy;  Laterality: N/A;    . ESOPHAGOGASTRODUODENOSCOPY (EGD) WITH PROPOFOL N/A 04/23/2019   Procedure: ESOPHAGOGASTRODUODENOSCOPY (EGD) WITH PROPOFOL;  Surgeon: Jerene Bears, MD;  Location: [facility] ENDOSCOPY;  Service: Gastroenterology;  Laterality: N/A;  . HEMOSTASIS CLIP PLACEMENT  04/17/2019   Procedure: HEMOSTASIS CLIP PLACEMENT;  Surgeon: Irene Shipper, MD;  Location: [facility] ENDOSCOPY;  Service: Endoscopy;;  . HEMOSTASIS CLIP PLACEMENT  04/21/2019   Procedure: HEMOSTASIS CLIP PLACEMENT;  Surgeon: Jerene Bears, MD;  Location: [place] ENDOSCOPY;  Service: Gastroenterology;;  . IR FLUORO GUIDE CV  LINE RIGHT  04/10/2019  . IR REMOVAL TUN CV CATH W/O FL  04/17/2019  . IR US GUIDE VASC ACCESS RIGHT  04/10/2019  . SUBMUCOSAL INJECTION  04/17/2019   Procedure: SUBMUCOSAL INJECTION;  Surgeon: Irene Shipper, MD;  Location: [facility] ENDOSCOPY;  Service: Endoscopy;;    ROS: Review of Systems Negative except as stated above  PHYSICAL EXAM: BP (!) 130/95   Pulse 89   Temp (!) 97.5 F (36.4 C)   Resp 16   Wt 157 lb 9.6 oz (71.5 kg)   SpO2 96%   BMI 23.27 kg/m   Wt Readings from Last 3 Encounters:  08/24/19 157 lb 9.6 oz (71.5 kg)  08/07/19 138 lb (62.6 kg)  06/15/19 136 lb (61.7 kg)    Physical Exam  General appearance - alert, well appearing, and in no distress Mental status -patient is quiet but answers some questions. Eyes - pupils equal and reactive, extraocular eye movements intact Neck - supple, no significant adenopathy Chest - clear to auscultation, no wheezes, rales or rhonchi, symmetric air entry Heart - normal rate, regular rhythm, normal S1, S2, no murmurs, rubs, clicks or gallops Extremities - peripheral pulses normal, no pedal edema, no clubbing or cyanosis Diabetic Foot Exam - Simple   Simple Foot Form Visual Inspection See comments: Yes Sensation Testing See comments: Yes Pulse Check Posterior Tibialis and Dorsalis pulse intact bilaterally: Yes Comments He has decreased sensation on the ball of both feet.  Small  calluses on the medial aspect of both big toes.     CMP Latest Ref Rng & Units 04/30/2019 04/23/2019 04/22/2019  Glucose 65 - 99 mg/dL 66 118(H) 158(H)  BUN 6 - 24 mg/dL 12 40(H) 60(H)  Creatinine 0.76 - 1.27 mg/dL 1.17 1.84(H) 1.81(H)  Sodium 134 - 144 mmol/L 138 139 136  Potassium 3.5 - 5.2 mmol/L 4.8 4.1 4.9  Chloride 96 - 106 mmol/L 102 116(H) 116(H)  CO2 20 - 29 mmol/L 21 18(L) 14(L)  Calcium 8.7 - 10.2 mg/dL 8.8 7.4(L) 7.1(L)  Total Protein 6.0 - 8.5 g/dL 6.2 3.1(L) -  Total Bilirubin 0.0 - 1.2 mg/dL 0.3 0.5 -  Alkaline Phos 39 - 117 IU/L 82 27(L) -  AST 0 - 40 IU/L 35 18 -  ALT 0 - 44 IU/L 44 21 -   Lipid Panel     Component Value Date/Time   CHOL 226 (H) 04/30/2019 1633   TRIG 152 (H) 04/30/2019 1633   HDL 31 (L) 04/30/2019 1633   CHOLHDL 7.3 (H) 04/30/2019 1633   LDLCALC 167 (H) 04/30/2019 1633    CBC    Component Value Date/Time   WBC 6.5 05/25/2019 1624   WBC 16.7 (H) 04/23/2019 0645   RBC 3.88 (L) 05/25/2019 1624   RBC 1.78 (L) 04/23/2019 0645   HGB 11.2 (L) 05/25/2019 1624   HCT 33.9 (L) 05/25/2019 1624   PLT 211 05/25/2019 1624   MCV 87 05/25/2019 1624   MCH 28.9 05/25/2019 1624   MCH 31.5 04/23/2019 0645   MCHC 33.0 05/25/2019 1624   MCHC 35.4 04/23/2019 0645   RDW 12.0 05/25/2019 1624   LYMPHSABS 1.3 04/15/2019 0301   MONOABS 0.6 04/15/2019 0301   EOSABS 0.0 04/15/2019 0301   BASOSABS 0.0 04/15/2019 0301    ASSESSMENT AND PLAN:  1. Type 2 diabetes mellitus with diabetic polyneuropathy, without long-term current use of insulin (HCC) A1c is at goal. Dietary counseling given. Encourage moderate intensity exercise of at least 150 minutes total per week.  Referral for eye exam - POCT glucose (manual entry) - POCT glycosylated hemoglobin (Hb A1C)  2. Elevated blood pressure reading DASH diet discussed and encouraged.  Follow-up with clinical pharmacist in about 3 weeks for repeat blood pressure check.  If still elevated please start him on  lisinopril  3. Seizure disorder ([place]) Controlled on Keppra.  Followed by neurology  4. PUD (peptic ulcer disease) Healed.  Check CBC today.  If normalized we can stop the iron supplement - CBC  5. Anemia, unspecified type See #4 above  6. Memory changes Being evaluated by neurology  7. Need for 23-polyvalent pneumococcal polysaccharide vaccine Given today. Plan to give the Tdap on next visit.    Patient was given the opportunity to ask questions.  Patient verbalized understanding of the plan and was able to repeat key elements of the plan.   Orders Placed This Encounter  Procedures  . CBC  . POCT glucose (manual entry)  . POCT glycosylated hemoglobin (Hb A1C)     Requested Prescriptions    No prescriptions requested or ordered in this encounter    Return in about 4 months (around 12/25/2019) for Give appt with [facility] in 1 month for BP recheck.  Karle Plumber, MD, FACP

## 2019-08-25 ENCOUNTER — Other Ambulatory Visit: Payer: Self-pay | Admitting: Internal Medicine

## 2019-08-25 LAB — CBC
Hematocrit: 43.9 % (ref 37.5–51.0)
Hemoglobin: 13.9 g/dL (ref 13.0–17.7)
MCH: 25.2 pg — ABNORMAL LOW (ref 26.6–33.0)
MCHC: 31.7 g/dL (ref 31.5–35.7)
MCV: 80 fL (ref 79–97)
Platelets: 234 10*3/uL (ref 150–450)
RBC: 5.52 x10E6/uL (ref 4.14–5.80)
RDW: 12 % (ref 11.6–15.4)
WBC: 6.7 10*3/uL (ref 3.4–10.8)

## 2019-08-25 NOTE — Progress Notes (Signed)
Let patient and his sister know that he is no longer anemic.  He can discontinue the iron supplement tablets.

## 2019-09-01 MED FILL — PANTOPRAZOLE SOD DR 40 MG T: 40 | 30 days supply | Qty: 60 | Fill #2

## 2019-09-01 MED FILL — levETIRAcetam 500 MG TABS: 500 | 90 days supply | Qty: 180 | Fill #1

## 2019-09-01 MED FILL — GLIMEPIRIDE 2 MG TABS: 2 | 90 days supply | Qty: 90 | Fill #1

## 2019-09-01 MED FILL — ATORVASTATIN 10 MG TABLET: 10 | 90 days supply | Qty: 90 | Fill #1

## 2019-09-13 ENCOUNTER — Ambulatory Visit
Admission: RE | Admit: 2019-09-13 | Discharge: 2019-09-13 | Disposition: A | Discharge status: Home / Self Care | Payer: Medicaid Other | Source: Ambulatory Visit | Attending: Neurology | Admitting: Neurology

## 2019-09-13 DIAGNOSIS — G40909 Epilepsy, unspecified, not intractable, without status epilepticus: Secondary | ICD-10-CM

## 2019-09-13 DIAGNOSIS — R569 Unspecified convulsions: Secondary | ICD-10-CM

## 2019-09-13 DIAGNOSIS — R9089 Other abnormal findings on diagnostic imaging of central nervous system: Secondary | ICD-10-CM

## 2019-09-13 DIAGNOSIS — G9340 Encephalopathy, unspecified: Secondary | ICD-10-CM

## 2019-09-13 MED ORDER — GADOBENATE DIMEGLUMINE 529 MG/ML IV SOLN
14.0000 mL | Freq: Once | INTRAVENOUS | Status: AC | PRN
  Administered 2019-09-13: 14 mL via INTRAVENOUS

## 2019-09-19 ENCOUNTER — Other Ambulatory Visit: Payer: Medicaid Other

## 2019-09-21 ENCOUNTER — Other Ambulatory Visit: Payer: Self-pay

## 2019-09-21 ENCOUNTER — Encounter: Payer: Self-pay | Admitting: Pharmacist

## 2019-09-21 ENCOUNTER — Ambulatory Visit: Payer: Medicaid Other | Attending: Internal Medicine | Admitting: Pharmacist

## 2019-09-21 VITALS — BP 132/101 | HR 90

## 2019-09-21 DIAGNOSIS — E119 Type 2 diabetes mellitus without complications: Secondary | ICD-10-CM | POA: Insufficient documentation

## 2019-09-21 DIAGNOSIS — R030 Elevated blood-pressure reading, without diagnosis of hypertension: Secondary | ICD-10-CM

## 2019-09-21 DIAGNOSIS — I10 Essential (primary) hypertension: Secondary | ICD-10-CM

## 2019-09-21 MED ORDER — LISINOPRIL 10 MG PO TABS: 10.0000 mg | ORAL_TABLET | Freq: Every day | ORAL | 0 refills | Status: DC

## 2019-09-21 NOTE — Progress Notes (Signed)
   S:   PCP: Dr. Wynetta Emery   Patient arrives in good spirits. Presents to the clinic for hypertension evaluation, counseling, and management.  Patient was referred and last seen by Primary Care Provider on 08/24/2019. BP was elevated; Dr. Wynetta Emery recommended we start lisinopril today if BP is elevated.    Medication adherence:  - Pt does not take medications for BP at this time.  Current BP Medications include:  none  Dietary habits include: sister limits salt in what she cooks; patient denies drinking caffeine  Exercise habits include: none  Family / Social history:  - FHx: DM - Tobacco: never smoker  - Alcohol: denies use   O:  Vitals:   09/21/19 0859  BP: (!) 132/101  Pulse: 90    Home BP readings: none   Last 3 Office BP readings: BP Readings from Last 3 Encounters:  09/21/19 (!) 132/101  08/24/19 (!) 130/95  08/07/19 112/72    BMET    Component Value Date/Time   NA 138 04/30/2019 1633   K 4.8 04/30/2019 1633   CL 102 04/30/2019 1633   CO2 21 04/30/2019 1633   GLUCOSE 66 04/30/2019 1633   GLUCOSE 118 (H) 04/23/2019 0645   BUN 12 04/30/2019 1633   CREATININE 1.17 04/30/2019 1633   CALCIUM 8.8 04/30/2019 1633   GFRNONAA 73 04/30/2019 1633   GFRAA 84 04/30/2019 1633    Renal function: CrCl cannot be calculated (Patient's most recent lab result is older than the maximum 21 days allowed.).  Clinical ASCVD: No  The 10-year ASCVD risk score Mikey Bussing DC Jr., et al., 2013) is: 13.3%   Values used to calculate the score:     Age: 49 years     Sex: Male     Is Non-Hispanic African American: No     Diabetic: Yes     Tobacco smoker: No     Systolic Blood Pressure: 569 mmHg     Is BP treated: No     HDL Cholesterol: 31 mg/dL     Total Cholesterol: 226 mg/dL   A/P: Hypertension newly diagnosed currently above goal. BP Goal = < 130/80 mmHg. Medication adherence: pt is not currently on anti-hypertensive therapy. Will add lisinopril per PCP recommendation.  -Started  lisinopril 10 mg daily.  -F/u labs ordered - recommend BMP in 4 weeks  -Counseled on lifestyle modifications for blood pressure control including reduced dietary sodium, increased exercise, adequate sleep.  Results reviewed and written information provided.   Total time in face-to-face counseling 15 minutes.   F/U Clinic Visit in 1 month.   Benard Halsted, PharmD, [place] [phone]

## 2019-09-28 ENCOUNTER — Telehealth: Payer: Self-pay | Admitting: Neurology

## 2019-09-28 NOTE — Telephone Encounter (Signed)
Patient's sister called for MRI results. She said they've been released to MyChart but she needs help interpreting them.

## 2019-09-28 NOTE — Telephone Encounter (Signed)
Spoke to sister [name]. Discussed MRI results. Physically he is doing better, but cognitively still bad, they need to repeat things. He is mostly at home, not driving, he has not expressed the want to drive. He has not any seizures, sometimes he stares but when called, he will respond slowly. She does not think he is depressed. If he does not see his sister, he panics, has become very dependent on her. Would rather have [name] drive him rather than another sister. Proceed with EEG as discussed.Would also do Neurocognitive testing and send serum autoimmune panel through [facility] clinic.  Heather, pls order routine EEG, Neuropsych, and let's fill out [facility] clinic form. Thanks

## 2019-09-28 NOTE — Telephone Encounter (Signed)
FYI

## 2019-09-29 ENCOUNTER — Other Ambulatory Visit: Payer: Self-pay

## 2019-09-29 DIAGNOSIS — G40909 Epilepsy, unspecified, not intractable, without status epilepticus: Secondary | ICD-10-CM

## 2019-09-29 DIAGNOSIS — G9340 Encephalopathy, unspecified: Secondary | ICD-10-CM

## 2019-09-29 DIAGNOSIS — R9089 Other abnormal findings on diagnostic imaging of central nervous system: Secondary | ICD-10-CM

## 2019-09-29 NOTE — Telephone Encounter (Signed)
Orders for routine EEG, Neuropsych, placed in Epic and l filled out [facility] clinic forms

## 2019-10-05 ENCOUNTER — Ambulatory Visit (INDEPENDENT_AMBULATORY_CARE_PROVIDER_SITE_OTHER): Payer: Medicaid Other | Admitting: Neurology

## 2019-10-05 ENCOUNTER — Other Ambulatory Visit: Payer: Medicaid Other

## 2019-10-05 ENCOUNTER — Other Ambulatory Visit: Payer: Self-pay

## 2019-10-05 DIAGNOSIS — G9340 Encephalopathy, unspecified: Secondary | ICD-10-CM

## 2019-10-05 DIAGNOSIS — G40909 Epilepsy, unspecified, not intractable, without status epilepticus: Secondary | ICD-10-CM | POA: Diagnosis not present

## 2019-10-05 DIAGNOSIS — R9089 Other abnormal findings on diagnostic imaging of central nervous system: Secondary | ICD-10-CM

## 2019-10-06 ENCOUNTER — Telehealth: Payer: Self-pay

## 2019-10-06 NOTE — Telephone Encounter (Signed)
Pts sister to return call on 10/07/19

## 2019-10-06 NOTE — Procedures (Signed)
ELECTROENCEPHALOGRAM REPORT  Date of Study: 10/05/2019  Patient's Name: [name] MRN: [id number] Date of Birth: Jan 02, 1971  Referring Provider: Dr. Ellouise Newer  Clinical History: This is a 49 year old man who presented with altered mental status, seizure with left gaze deviation, MRI showing PRES. He has continued cognitive deficits.  Medications: KEPPRA 500 MG tablet TYLENOL 500 MG tablet LIPITOR 10 MG tablet 65 FE MG tablet ALLEGRA 180 MG tablet AMARYL 2 MG tablet PROTONIX 40 MG tablet  Technical Summary: A multichannel digital 1-hour EEG recording measured by the international 10-20 system with electrodes applied with paste and impedances below 5000 ohms performed in our laboratory with EKG monitoring in an awake and asleep patient.  Hyperventilation was not performed. Photic stimulation was performed.  The digital EEG was referentially recorded, reformatted, and digitally filtered in a variety of bipolar and referential montages for optimal display.    Description: The patient is awake and asleep during the recording.  During maximal wakefulness, there is a symmetric, medium voltage 10 Hz posterior dominant rhythm that attenuates with eye opening.  The record is symmetric.  During drowsiness and sleep, there is an increase in theta slowing of the background, at times with shifting asymmetry over the bilateral temporal regions.  Vertex waves and symmetric sleep spindles were seen. Photic stimulation did not elicit any abnormalities.  There were no epileptiform discharges or electrographic seizures seen.    EKG lead was unremarkable.  Impression: This 1-hour awake and asleep EEG is within normal limits.  Clinical Correlation: A normal EEG does not exclude a clinical diagnosis of epilepsy.  If further clinical questions remain, prolonged EEG may be helpful.  Clinical correlation is advised.   Ellouise Newer, M.D.

## 2019-10-07 ENCOUNTER — Telehealth: Payer: Self-pay

## 2019-10-07 NOTE — Telephone Encounter (Signed)
Spoke with pts sister, [name], and informed her that EEG testing was normal with no seizure activity noted, confirmed appt on 11/13/2019 for neurocognitive testing, she verbalized understanding.

## 2019-10-09 ENCOUNTER — Encounter (HOSPITAL_COMMUNITY): Payer: Self-pay

## 2019-10-09 ENCOUNTER — Inpatient Hospital Stay (HOSPITAL_COMMUNITY)
Admission: EM | Admit: 2019-10-09 | Discharge: 2019-10-11 | DRG: 603 | Disposition: A | Discharge status: Home / Self Care | Payer: Medicaid Other | Attending: Internal Medicine | Admitting: Internal Medicine

## 2019-10-09 ENCOUNTER — Emergency Department (HOSPITAL_COMMUNITY): Payer: Medicaid Other

## 2019-10-09 ENCOUNTER — Ambulatory Visit (INDEPENDENT_AMBULATORY_CARE_PROVIDER_SITE_OTHER)
Admission: EM | Admit: 2019-10-09 | Discharge: 2019-10-09 | Disposition: A | Discharge status: Other Acute Inpatient Hospital | Payer: Medicaid Other | Source: Home / Self Care

## 2019-10-09 ENCOUNTER — Other Ambulatory Visit: Payer: Self-pay

## 2019-10-09 DIAGNOSIS — Z8711 Personal history of peptic ulcer disease: Secondary | ICD-10-CM

## 2019-10-09 DIAGNOSIS — R569 Unspecified convulsions: Secondary | ICD-10-CM

## 2019-10-09 DIAGNOSIS — Z79899 Other long term (current) drug therapy: Secondary | ICD-10-CM

## 2019-10-09 DIAGNOSIS — K760 Fatty (change of) liver, not elsewhere classified: Secondary | ICD-10-CM | POA: Diagnosis present

## 2019-10-09 DIAGNOSIS — G40909 Epilepsy, unspecified, not intractable, without status epilepticus: Secondary | ICD-10-CM | POA: Diagnosis present

## 2019-10-09 DIAGNOSIS — E119 Type 2 diabetes mellitus without complications: Secondary | ICD-10-CM

## 2019-10-09 DIAGNOSIS — L03113 Cellulitis of right upper limb: Secondary | ICD-10-CM

## 2019-10-09 DIAGNOSIS — Z880 Allergy status to penicillin: Secondary | ICD-10-CM

## 2019-10-09 DIAGNOSIS — Z833 Family history of diabetes mellitus: Secondary | ICD-10-CM

## 2019-10-09 DIAGNOSIS — Z20822 Contact with and (suspected) exposure to covid-19: Secondary | ICD-10-CM | POA: Diagnosis present

## 2019-10-09 DIAGNOSIS — Z8673 Personal history of transient ischemic attack (TIA), and cerebral infarction without residual deficits: Secondary | ICD-10-CM

## 2019-10-09 DIAGNOSIS — Z7984 Long term (current) use of oral hypoglycemic drugs: Secondary | ICD-10-CM

## 2019-10-09 DIAGNOSIS — R413 Other amnesia: Secondary | ICD-10-CM | POA: Diagnosis present

## 2019-10-09 DIAGNOSIS — L02511 Cutaneous abscess of right hand: Secondary | ICD-10-CM | POA: Diagnosis present

## 2019-10-09 HISTORY — DX: Psoriasis, unspecified: L40.9

## 2019-10-09 LAB — CBC WITH DIFFERENTIAL/PLATELET
Abs Immature Granulocytes: 0.03 10*3/uL (ref 0.00–0.07)
Basophils Absolute: 0.1 10*3/uL (ref 0.0–0.1)
Basophils Relative: 1 %
Eosinophils Absolute: 0.9 10*3/uL — ABNORMAL HIGH (ref 0.0–0.5)
Eosinophils Relative: 13 %
HCT: 42.8 % (ref 39.0–52.0)
Hemoglobin: 13.6 g/dL (ref 13.0–17.0)
Immature Granulocytes: 0 %
Lymphocytes Relative: 30 %
Lymphs Abs: 2.1 10*3/uL (ref 0.7–4.0)
MCH: 24.8 pg — ABNORMAL LOW (ref 26.0–34.0)
MCHC: 31.8 g/dL (ref 30.0–36.0)
MCV: 78.1 fL — ABNORMAL LOW (ref 80.0–100.0)
Monocytes Absolute: 0.7 10*3/uL (ref 0.1–1.0)
Monocytes Relative: 10 %
Neutro Abs: 3.2 10*3/uL (ref 1.7–7.7)
Neutrophils Relative %: 46 %
Platelets: 242 10*3/uL (ref 150–400)
RBC: 5.48 MIL/uL (ref 4.22–5.81)
RDW: 12.3 % (ref 11.5–15.5)
WBC: 6.9 10*3/uL (ref 4.0–10.5)
nRBC: 0 % (ref 0.0–0.2)

## 2019-10-09 LAB — CBG MONITORING, ED
Glucose-Capillary: 75 mg/dL (ref 70–99)
Glucose-Capillary: 76 mg/dL (ref 70–99)

## 2019-10-09 LAB — COMPREHENSIVE METABOLIC PANEL
ALT: 41 U/L (ref 0–44)
AST: 26 U/L (ref 15–41)
Albumin: 4.5 g/dL (ref 3.5–5.0)
Alkaline Phosphatase: 73 U/L (ref 38–126)
Anion gap: 9 (ref 5–15)
BUN: 18 mg/dL (ref 6–20)
CO2: 25 mmol/L (ref 22–32)
Calcium: 9.6 mg/dL (ref 8.9–10.3)
Chloride: 105 mmol/L (ref 98–111)
Creatinine, Ser: 0.93 mg/dL (ref 0.61–1.24)
GFR calc Af Amer: >60 mL/min (ref >60)
GFR calc non Af Amer: >60 mL/min (ref >60)
Glucose, Bld: 85 mg/dL (ref 70–99)
Potassium: 4 mmol/L (ref 3.5–5.1)
Sodium: 139 mmol/L (ref 135–145)
Total Bilirubin: 0.6 mg/dL (ref 0.3–1.2)
Total Protein: 6.8 g/dL (ref 6.5–8.1)

## 2019-10-09 LAB — SARS CORONAVIRUS 2 BY RT PCR (HOSPITAL ORDER, PERFORMED IN ~~LOC~~ HOSPITAL LAB): SARS Coronavirus 2: NEGATIVE

## 2019-10-09 LAB — LACTIC ACID, PLASMA: Lactic Acid, Venous: 1.2 mmol/L (ref 0.5–1.9)

## 2019-10-09 LAB — HEMOGLOBIN A1C
Hgb A1c MFr Bld: 7.3 % — ABNORMAL HIGH (ref 4.8–5.6)
Mean Plasma Glucose: 162.81 mg/dL

## 2019-10-09 MED ORDER — ACETAMINOPHEN 500 MG PO TABS
500.0000 mg | ORAL_TABLET | Freq: Four times a day (QID) | ORAL | Status: DC | PRN
  Administered 2019-10-10 – 2019-10-11 (×3): 500 mg via ORAL
  Filled 2019-10-09 (×3): qty 1

## 2019-10-09 MED ORDER — HYDROXYZINE HCL 25 MG PO TABS
25.0000 mg | ORAL_TABLET | Freq: Three times a day (TID) | ORAL | Status: DC | PRN
  Administered 2019-10-10 – 2019-10-11 (×4): 25 mg via ORAL
  Filled 2019-10-09 (×4): qty 1

## 2019-10-09 MED ORDER — ONDANSETRON HCL 4 MG/2ML IJ SOLN: 4.0000 mg | Freq: Four times a day (QID) | INTRAMUSCULAR | Status: DC | PRN

## 2019-10-09 MED ORDER — OXYCODONE HCL 5 MG PO TABS
5.0000 mg | ORAL_TABLET | ORAL | Status: DC | PRN
  Administered 2019-10-10: 5 mg via ORAL
  Filled 2019-10-09: qty 1

## 2019-10-09 MED ORDER — CEPHALEXIN 500 MG PO CAPS
500.0000 mg | ORAL_CAPSULE | Freq: Four times a day (QID) | ORAL | Status: DC
  Administered 2019-10-09 – 2019-10-10 (×2): 500 mg via ORAL
  Filled 2019-10-09 (×2): qty 1

## 2019-10-09 MED ORDER — PANTOPRAZOLE SODIUM 40 MG PO TBEC
40.0000 mg | DELAYED_RELEASE_TABLET | Freq: Every day | ORAL | Status: DC
  Administered 2019-10-10 – 2019-10-11 (×2): 40 mg via ORAL
  Filled 2019-10-09 (×2): qty 1

## 2019-10-09 MED ORDER — INSULIN ASPART 100 UNIT/ML ~~LOC~~ SOLN
0.0000 [IU] | Freq: Three times a day (TID) | SUBCUTANEOUS | Status: DC
  Administered 2019-10-10: 3 [IU] via SUBCUTANEOUS
  Administered 2019-10-11: 2 [IU] via SUBCUTANEOUS

## 2019-10-09 MED ORDER — DIPHENHYDRAMINE HCL 50 MG/ML IJ SOLN
25.0000 mg | Freq: Once | INTRAMUSCULAR | Status: AC
  Administered 2019-10-09: 25 mg via INTRAVENOUS
  Filled 2019-10-09: qty 1

## 2019-10-09 MED ORDER — LISINOPRIL 10 MG PO TABS
10.0000 mg | ORAL_TABLET | Freq: Every day | ORAL | Status: DC
  Administered 2019-10-10 – 2019-10-11 (×2): 10 mg via ORAL
  Filled 2019-10-09 (×2): qty 1

## 2019-10-09 MED ORDER — ATORVASTATIN CALCIUM 10 MG PO TABS
10.0000 mg | ORAL_TABLET | Freq: Every day | ORAL | Status: DC
  Administered 2019-10-10 – 2019-10-11 (×2): 10 mg via ORAL
  Filled 2019-10-09 (×2): qty 1

## 2019-10-09 MED ORDER — INSULIN ASPART 100 UNIT/ML ~~LOC~~ SOLN: 0.0000 [IU] | Freq: Every day | SUBCUTANEOUS | Status: DC

## 2019-10-09 MED ORDER — SODIUM CHLORIDE 0.9 % IV SOLN
2.0000 g | INTRAVENOUS | Status: DC
  Administered 2019-10-09: 2 g via INTRAVENOUS
  Filled 2019-10-09: qty 20

## 2019-10-09 MED ORDER — ONDANSETRON HCL 4 MG PO TABS: 4.0000 mg | ORAL_TABLET | Freq: Four times a day (QID) | ORAL | Status: DC | PRN

## 2019-10-09 MED ORDER — HYDROXYZINE HCL 25 MG PO TABS
25.0000 mg | ORAL_TABLET | Freq: Once | ORAL | Status: AC
  Administered 2019-10-09: 25 mg via ORAL
  Filled 2019-10-09: qty 1

## 2019-10-09 MED ORDER — SODIUM CHLORIDE 0.9 % IV SOLN: 2.0000 g | Freq: Once | INTRAVENOUS | Status: DC

## 2019-10-09 MED ORDER — SODIUM CHLORIDE 0.9 % IV BOLUS
1000.0000 mL | Freq: Once | INTRAVENOUS | Status: AC
  Administered 2019-10-09: 1000 mL via INTRAVENOUS

## 2019-10-09 MED ORDER — SODIUM CHLORIDE 0.9 % IV SOLN: INTRAVENOUS | Status: AC

## 2019-10-09 MED ORDER — SODIUM CHLORIDE 0.9% FLUSH
3.0000 mL | Freq: Two times a day (BID) | INTRAVENOUS | Status: DC
  Administered 2019-10-09 – 2019-10-11 (×4): 3 mL via INTRAVENOUS

## 2019-10-09 MED ORDER — IBUPROFEN 200 MG PO TABS: 400.0000 mg | ORAL_TABLET | Freq: Four times a day (QID) | ORAL | Status: DC | PRN

## 2019-10-09 MED ORDER — HYDROCODONE-ACETAMINOPHEN 5-325 MG PO TABS
1.0000 | ORAL_TABLET | Freq: Once | ORAL | Status: AC
  Administered 2019-10-09: 1 via ORAL
  Filled 2019-10-09: qty 1

## 2019-10-09 MED ORDER — ENOXAPARIN SODIUM 40 MG/0.4ML ~~LOC~~ SOLN
40.0000 mg | SUBCUTANEOUS | Status: DC
  Administered 2019-10-10 – 2019-10-11 (×2): 40 mg via SUBCUTANEOUS
  Filled 2019-10-09 (×2): qty 0.4

## 2019-10-09 MED ORDER — LEVETIRACETAM 500 MG PO TABS
500.0000 mg | ORAL_TABLET | Freq: Two times a day (BID) | ORAL | Status: DC
  Administered 2019-10-09 – 2019-10-11 (×4): 500 mg via ORAL
  Filled 2019-10-09 (×4): qty 1

## 2019-10-09 MED ORDER — SODIUM CHLORIDE 0.9% FLUSH
3.0000 mL | Freq: Once | INTRAVENOUS | Status: AC
  Administered 2019-10-10: 3 mL via INTRAVENOUS

## 2019-10-09 NOTE — ED Triage Notes (Signed)
Pt arrives to ED w/ c/o cellulitis to R hand. Pt does not know how it started, but just started to itch and then became swollen. Pt reports 8/10 pain.

## 2019-10-09 NOTE — Plan of Care (Signed)

## 2019-10-09 NOTE — H&P (Signed)
History and Physical    [name] VOJ:[id number] DOB: 1970-11-25 DOA: 10/09/2019  PCP: Ladell Pier, MD Patient coming from: Home  Chief Complaint: Swelling of right arm  HPI: [name] is a 49 y.o. male with medical history significant of DM2, peptic ulcer disease, seizure disorder, possible TIAs who presents with swelling and pain of the right hand/wrist.  Patient is helped with history by sister who is in room.  [name] reports that swelling started yesterday.  He was treated with cortisone cream, benadryl due to presumed allergy.  In the course of that time, he had a small pustule arise on the right lateral wrist which has since popped and is accompanied by a raised rash about a dime in size.  The pustule had pus like fluid inside.  The swelling got worse and was accompanied by progressive streaking up the arm.  He now is unable to move the wrist.  He has had no fever, chills, nausea, vomiting, SOB or other signs/symptoms of infection.  He has issues with memory since a prolonged hospitalization in Dec/January of this year that was initially due to encephalopathy and AKI, unclear initial cause.  He has no history of IVDU.  He has no recent bite wounds (has a dog, but no bites).  He is not a gardener, no new watches or other fabrics.  He has no history of brackish or sea fishing, no fish tanks for which he cares.   On review of history, patient and sister tell me that he never had cirrhosis and that he had psoriasis as a child.  Imaging reviewed and at most has fatty liver.  Will change PMH.   ED Course: In the ED, he was treated with Rocephin X 1 dose.  He had a normal WBC and normal renal function.  He developed itching after hydrocodone-apap dosing.  Xray of the hand wrist showed lucency of the bone that could not exclude OM.  BC were ordered by me.    Review of Systems: As per HPI otherwise all other systems reviewed and are negative.   Past Medical History:  Diagnosis Date  .  Diabetes mellitus, type 2 ([place])   . Duodenal ulcer   . Fatty liver   . Peptic ulcer disease   . Psoriasis   . Seizures ([place])     Past Surgical History:  Procedure Laterality Date  . BIOPSY  04/17/2019   Procedure: BIOPSY;  Surgeon: Irene Shipper, MD;  Location: [facility] ENDOSCOPY;  Service: Endoscopy;;  . ESOPHAGOGASTRODUODENOSCOPY Left 04/21/2019   Procedure: ESOPHAGOGASTRODUODENOSCOPY (EGD);  Surgeon: Jerene Bears, MD;  Location: [facility] ENDOSCOPY;  Service: Gastroenterology;  Laterality: Left;  . ESOPHAGOGASTRODUODENOSCOPY (EGD) WITH PROPOFOL N/A 04/17/2019   Procedure: ESOPHAGOGASTRODUODENOSCOPY (EGD) WITH PROPOFOL;  Surgeon: Irene Shipper, MD;  Location: [facility] ENDOSCOPY;  Service: Endoscopy;  Laterality: N/A;  . ESOPHAGOGASTRODUODENOSCOPY (EGD) WITH PROPOFOL N/A 04/23/2019   Procedure: ESOPHAGOGASTRODUODENOSCOPY (EGD) WITH PROPOFOL;  Surgeon: Jerene Bears, MD;  Location: [facility] ENDOSCOPY;  Service: Gastroenterology;  Laterality: N/A;  . HEMOSTASIS CLIP PLACEMENT  04/17/2019   Procedure: HEMOSTASIS CLIP PLACEMENT;  Surgeon: Irene Shipper, MD;  Location: [facility] ENDOSCOPY;  Service: Endoscopy;;  . HEMOSTASIS CLIP PLACEMENT  04/21/2019   Procedure: HEMOSTASIS CLIP PLACEMENT;  Surgeon: Jerene Bears, MD;  Location: [place] ENDOSCOPY;  Service: Gastroenterology;;  . IR FLUORO GUIDE CV LINE RIGHT  04/10/2019  . IR REMOVAL TUN CV CATH W/O FL  04/17/2019  . IR US GUIDE VASC ACCESS RIGHT  04/10/2019  . SUBMUCOSAL INJECTION  04/17/2019   Procedure: SUBMUCOSAL INJECTION;  Surgeon: Irene Shipper, MD;  Location: [facility] ENDOSCOPY;  Service: Endoscopy;;    Social History  reports that he has never smoked. He has never used smokeless tobacco. He reports previous alcohol use. He reports previous drug use. Drug: Marijuana. He denies IVDU.  He is not a gardener.  He has not been fishing and has no fish tanks.  No roses.  He walks his dog (5 pound) in the garden occasionally.    Allergies  Allergen Reactions  . Penicillins Hives and Other (See  Comments)    Childhood allergy  Did it involve swelling of the face/tongue/throat, SOB, or low BP? Unknown Did it involve sudden or severe rash/hives, skin peeling, or any reaction on the inside of your mouth or nose? Unknown Did you need to seek medical attention at a hospital or doctor's office? Unknown When did it last happen?Pt was a child If all above answers are "NO", may proceed with cephalosporin use. Sister states he just breaks out in hives unsure about other reactions.      Family History  Problem Relation Age of Onset  . Diabetes Mother   . Diabetes Sister   . Diabetes Father   . Esophageal cancer Neg Hx   . Stomach cancer Neg Hx   . Colon cancer Neg Hx   . Pancreatic cancer Neg Hx     Prior to Admission medications   Medication Sig Start Date End Date Taking? Authorizing Provider  acetaminophen (TYLENOL) 500 MG tablet Take 1 tablet (500 mg total) by mouth every 6 (six) hours as needed for mild pain, moderate pain, fever or headache. 04/19/19   Domenic Polite, MD  atorvastatin (LIPITOR) 10 MG tablet Take 1 tablet (10 mg total) by mouth daily. 05/25/19   Ladell Pier, MD  fexofenadine (ALLEGRA) 180 MG tablet Take 180 mg by mouth daily as needed for allergies or rhinitis.    [provider]  glimepiride (AMARYL) 2 MG tablet Take 1 tablet (2 mg total) by mouth daily before breakfast. 05/25/19   Ladell Pier, MD  levETIRAcetam (KEPPRA) 500 MG tablet Take 1 tablet (500 mg total) by mouth 2 (two) times daily. 05/25/19   Ladell Pier, MD  lisinopril (ZESTRIL) 10 MG tablet Take 1 tablet (10 mg total) by mouth daily. 09/21/19   Ladell Pier, MD  pantoprazole (PROTONIX) 40 MG tablet Once a day 04/30/19   Ladell Pier, MD    Physical Exam: Vitals:   10/09/19 1548 10/09/19 1817 10/09/19 1836 10/09/19 2019  BP: (!) 125/92 111/87  115/71  Pulse: 86 81  91  Resp: 18 18  17   Temp:      TempSrc:      SpO2: 100% 98%  99%  Weight:   68 kg     Height:   5\' 9"  (1.753 m)     Constitutional: NAD, calm, comfortable Eyes: conjunctival injection bilaterally, EOMI ENMT: Mucous membranes are mildly dry, NCAT Neck: Normal, supple Respiratory: clear to auscultation bilaterally, no wheezing, no crackles. Normal respiratory effort Cardiovascular: RR, NR, no murmur, no gallop Abdomen: mild distention, non tender, +BS Musculoskeletal: He has clear swelling of the right wrist with decreased ROM.  He does not voluntarily move the wrist.  He has about 10 degrees of extension and no non painful flexion.  He has lost normal markings of the wrist, fingers.  Skin: He has a small area of rash  on the lateral right wrist, could be consistent with contact dermatitis, small pustule which has been deroofed.  He has swelling of the wrist and hand, erythema, warmth and streaking up to mid upper arm.   Neurologic: He can feel light touch on the hands.  Strength grossly intact in hands.  Psychiatric: Normal judgment and insight. Alert and oriented x 3. Normal mood.   Labs on Admission: I have personally reviewed following labs and imaging studies  CBC: Recent Labs  Lab 10/09/19 1304  WBC 6.9  NEUTROABS 3.2  HGB 13.6  HCT 42.8  MCV 78.1*  PLT 932    Basic Metabolic Panel: Recent Labs  Lab 10/09/19 1304  NA 139  K 4.0  CL 105  CO2 25  GLUCOSE 85  BUN 18  CREATININE 0.93  CALCIUM 9.6    GFR: Estimated Creatinine Clearance: 91.4 mL/min (by C-G formula based on SCr of 0.93 mg/dL).  Liver Function Tests: Recent Labs  Lab 10/09/19 1304  AST 26  ALT 41  ALKPHOS 73  BILITOT 0.6  PROT 6.8  ALBUMIN 4.5    Urine analysis:    Component Value Date/Time   COLORURINE YELLOW 04/05/2019 1242   APPEARANCEUR HAZY (A) 04/05/2019 1242   LABSPEC 1.014 04/05/2019 1242   PHURINE 5.0 04/05/2019 1242   GLUCOSEU 50 (A) 04/05/2019 1242   HGBUR LARGE (A) 04/05/2019 1242   BILIRUBINUR NEGATIVE 04/05/2019 1242   KETONESUR NEGATIVE 04/05/2019 1242    PROTEINUR 100 (A) 04/05/2019 1242   UROBILINOGEN 0.2 09/08/2007 1029   NITRITE NEGATIVE 04/05/2019 1242   LEUKOCYTESUR NEGATIVE 04/05/2019 1242    Radiological Exams on Admission: DG Hand Complete Right  Result Date: 10/09/2019 CLINICAL DATA:  Cellulitis to right hand EXAM: RIGHT HAND - COMPLETE 3+ VIEW COMPARISON:  None. FINDINGS: Diffuse edematous changes seen most prominently across the medial aspect of the hand and wrist. Lucency is seen adjacent the base of the fifth metacarpal and along the medial aspect of the hamate, nonspecific though given the overlying inflammatory features, infection is not excluded. Findings on a background of diffuse mild arthrosis throughout the hand and wrist. No other suspicious osseous lesions. No fracture, subluxation, or dislocation. IMPRESSION: 1. Diffuse edematous changes most prominently across the medial aspect of the hand and wrist. 2. Lucency adjacent the base of the fifth metacarpal and along the medial aspect of the hamate, nonspecific though given the overlying inflammatory features, infection/early osteomyelitis is not excluded. Electronically Signed   By: Lovena Le M.D.   On: 10/09/2019 19:31    Assessment/Plan Cellulitis of right wrist - He meets criteria for mild cellulitis, non purulent at this time - Given a dose of Rocephin --> change to Keflex after 24 hours - Blood cultures - If not improving, would escalate antibiotics - Pain control with oxycodone, ibuprofen - Nausea control with Zofran - Itching control with hydroxyzine - I think osteomyelitis is less likely given physical exam, will check blood cultures.  Consider further imaging if not improving as expected.   Diabetes mellitus type 2 in nonobese  - Hold glimepiride - SSI - Last A1C 6.6    Seizures  - Continue home keppra    Memory changes - Update sisters with plan daily    History of Duodenal ulcer History of anemia - Continue home protonix  DVT  prophylaxis: Lovenox  Code Status:   Full  Family Communication:  Sisters at bedside  Disposition Plan:   Patient is from:  Home  Anticipated DC to:  Home  Anticipated DC date:  10/09/19  Anticipated DC barriers: Improvement in cellulitis  Consults called:  None  Admission status:  Obs, Med Surg   Severity of Illness: The appropriate patient status for this patient is OBSERVATION. Observation status is judged to be reasonable and necessary in order to provide the required intensity of service to ensure the patient's safety. The patient's presenting symptoms, physical exam findings, and initial radiographic and laboratory data in the context of their medical condition is felt to place them at decreased risk for further clinical deterioration. Furthermore, it is anticipated that the patient will be medically stable for discharge from the hospital within 2 midnights of admission. The following factors support the patient status of observation.   " The patient's presenting symptoms include Cellulitis of the wrist. " The physical exam findings include swelling of wrist, erythema. " The initial radiographic and laboratory data are Normal, possible OM.      Gilles Chiquito MD [facility]  How to contact the [facility] Attending or Consulting provider [place] or covering provider during after hours [place], for this patient?   1. Check the care team in [facility] and look for a) attending/consulting [facility] provider listed and b) the [facility] team listed 2. Log into [email or web address] and use [place]'s universal password to access. If you do not have the password, please contact the hospital operator. 3. Locate the [facility] provider you are looking for under [facility] and page to a number that you can be directly reached. 4. If you still have difficulty reaching the provider, please page the [facility] (Director on Call) for the Hospitalists listed on amion for assistance.  10/09/2019, 8:58 PM

## 2019-10-09 NOTE — ED Notes (Signed)
Patient is being discharged from the Urgent Care and sent to the Emergency Department via POV . Per Dr Meda Coffee, patient is in need of higher level of care due to further eval and higher level of care. Patient is aware and verbalizes understanding of plan of care.  Vitals:   10/09/19 1211  BP: (!) 126/92  Pulse: (!) 103  Resp: 16  Temp: (!) 97.4 F (36.3 C)  SpO2: 97%

## 2019-10-09 NOTE — Discharge Instructions (Signed)
Sister preferred to take the [place] er due to MD  Will need to r/o spesis and need for iv abx

## 2019-10-09 NOTE — ED Provider Notes (Signed)
[facility] Emergency Department Provider Note MRN:  [id number]  Arrival date & time: 10/09/19     Chief Complaint   Cellulitis   History of Present Illness   Bart Ashford is a 49 y.o. year-old male with a history of diabetes, seizure disorder presenting to the ED with chief complaint of cellulitis.  Patient noted small wound or abrasion to the right medial wrist yesterday, had some purulent discharge.  Over the past 24 hours the entire hand has become swollen, erythematous, painful, tender to palpation, with swelling and redness quickly spreading to the forearm.  Sent here by urgent care for fear that he will need IV antibiotics and admission.  Denies fever, no vomiting, no headache, no chest pain, shortness of breath, no abdominal pain, no trauma that he can remember.  No IV drugs.  Review of Systems  A complete 10 system review of systems was obtained and all systems are negative except as noted in the HPI and PMH.   Patient's Health History    Past Medical History:  Diagnosis Date  . Acute blood loss anemia   . Cirrhosis ([place])   . Diabetes mellitus, type 2 ([place])   . Duodenal ulcer   . Fatty liver   . Peptic ulcer disease   . Seizures ([place])     Past Surgical History:  Procedure Laterality Date  . BIOPSY  04/17/2019   Procedure: BIOPSY;  Surgeon: Irene Shipper, MD;  Location: [facility] ENDOSCOPY;  Service: Endoscopy;;  . ESOPHAGOGASTRODUODENOSCOPY Left 04/21/2019   Procedure: ESOPHAGOGASTRODUODENOSCOPY (EGD);  Surgeon: Jerene Bears, MD;  Location: [facility] ENDOSCOPY;  Service: Gastroenterology;  Laterality: Left;  . ESOPHAGOGASTRODUODENOSCOPY (EGD) WITH PROPOFOL N/A 04/17/2019   Procedure: ESOPHAGOGASTRODUODENOSCOPY (EGD) WITH PROPOFOL;  Surgeon: Irene Shipper, MD;  Location: [facility] ENDOSCOPY;  Service: Endoscopy;  Laterality: N/A;  . ESOPHAGOGASTRODUODENOSCOPY (EGD) WITH PROPOFOL N/A 04/23/2019   Procedure: ESOPHAGOGASTRODUODENOSCOPY (EGD) WITH PROPOFOL;  Surgeon: Jerene Bears, MD;  Location: [facility] ENDOSCOPY;  Service: Gastroenterology;  Laterality: N/A;  . HEMOSTASIS CLIP PLACEMENT  04/17/2019   Procedure: HEMOSTASIS CLIP PLACEMENT;  Surgeon: Irene Shipper, MD;  Location: [facility] ENDOSCOPY;  Service: Endoscopy;;  . HEMOSTASIS CLIP PLACEMENT  04/21/2019   Procedure: HEMOSTASIS CLIP PLACEMENT;  Surgeon: Jerene Bears, MD;  Location: [place] ENDOSCOPY;  Service: Gastroenterology;;  . IR FLUORO GUIDE CV LINE RIGHT  04/10/2019  . IR REMOVAL TUN CV CATH W/O FL  04/17/2019  . IR US GUIDE VASC ACCESS RIGHT  04/10/2019  . SUBMUCOSAL INJECTION  04/17/2019   Procedure: SUBMUCOSAL INJECTION;  Surgeon: Irene Shipper, MD;  Location: [facility] ENDOSCOPY;  Service: Endoscopy;;    Family History  Problem Relation Age of Onset  . Diabetes Mother   . Diabetes Sister   . Esophageal cancer Neg Hx   . Stomach cancer Neg Hx   . Colon cancer Neg Hx   . Pancreatic cancer Neg Hx     Social History   Socioeconomic History  . Marital status: Single    Spouse name: Not on file  . Number of children: Not on file  . Years of education: Not on file  . Highest education level: Not on file  Occupational History  . Not on file  Tobacco Use  . Smoking status: Never Smoker  . Smokeless tobacco: Never Used  Vaping Use  . Vaping Use: Never used  Substance and Sexual Activity  . Alcohol use: Not Currently  . Drug use: Not Currently  Types: Marijuana    Comment: 5 times per month (per 04/21/19 hospital note)  . Sexual activity: Not on file  Other Topics Concern  . Not on file  Social History Narrative  . Not on file   Social Determinants of Health   Financial Resource Strain:   . Difficulty of Paying Living Expenses:   Food Insecurity:   . Worried About Charity fundraiser in the Last Year:   . Arboriculturist in the Last Year:   Transportation Needs:   . Film/video editor (Medical):   Marland Kitchen Lack of Transportation (Non-Medical):   Physical Activity:   . Days of Exercise per Week:   . Minutes  of Exercise per Session:   Stress:   . Feeling of Stress :   Social Connections:   . Frequency of Communication with Friends and Family:   . Frequency of Social Gatherings with Friends and Family:   . Attends Religious Services:   . Active Member of Clubs or Organizations:   . Attends Archivist Meetings:   Marland Kitchen Marital Status:   Intimate Partner Violence:   . Fear of Current or Ex-Partner:   . Emotionally Abused:   Marland Kitchen Physically Abused:   . Sexually Abused:      Physical Exam   Vitals:   10/09/19 1548 10/09/19 1817  BP: (!) 125/92 111/87  Pulse: 86 81  Resp: 18 18  Temp:    SpO2: 100% 98%    CONSTITUTIONAL: Well-appearing, NAD NEURO:  Alert and oriented x 3, no focal deficits EYES:  eyes equal and reactive ENT/NECK:  no LAD, no JVD CARDIO: Regular rate, well-perfused, normal S1 and S2 PULM:  CTAB no wheezing or rhonchi GI/GU:  normal bowel sounds, non-distended, non-tender MSK/SPINE:  No gross deformities, no edema SKIN: Erythema and edema to the entire right hand with erythema spreading to the right forearm, neurovascularly intact distally; small healed abrasion to the medial right wrist PSYCH:  Appropriate speech and behavior  *Additional and/or pertinent findings included in MDM below  Diagnostic and Interventional Summary    EKG Interpretation  Date/Time:    Ventricular Rate:    PR Interval:    QRS Duration:   QT Interval:    QTC Calculation:   R Axis:     Text Interpretation:        Labs Reviewed  CBC WITH DIFFERENTIAL/PLATELET - Abnormal; Notable for the following components:      Result Value   MCV 78.1 (*)    MCH 24.8 (*)    Eosinophils Absolute 0.9 (*)    All other components within normal limits  SARS CORONAVIRUS 2 BY RT PCR (HOSPITAL ORDER, [place] [facility])  LACTIC ACID, PLASMA  COMPREHENSIVE METABOLIC PANEL  LACTIC ACID, PLASMA  URINALYSIS, ROUTINE W REFLEX MICROSCOPIC  CBG MONITORING, ED  CBG MONITORING, ED      DG Hand Complete Right  Final Result      Medications  sodium chloride flush (NS) 0.9 % injection 3 mL (has no administration in time range)  cefTRIAXone (ROCEPHIN) 2 g in sodium chloride 0.9 % 100 mL IVPB (has no administration in time range)  sodium chloride 0.9 % bolus 1,000 mL (1,000 mLs Intravenous New Bag/Given 10/09/19 1906)  HYDROcodone-acetaminophen (NORCO/VICODIN) 5-325 MG per tablet 1 tablet (1 tablet Oral Given 10/09/19 1905)     Procedures  /  Critical Care Procedures  ED Course and Medical Decision Making  I have reviewed the triage  vital signs, the nursing notes, and pertinent available records from the EMR.  Listed above are laboratory and imaging tests that I personally ordered, reviewed, and interpreted and then considered in my medical decision making (see below for details).      Cellulitis but seems to be rapidly worsening over the past 24 hours.  No systemic symptoms, neurovascularly intact.  Options of management were discussed and patient and patient's sister would prefer overnight observation to ensure that the infection is improving on IV antibiotics.  Will consult hospitalist for admission.  Awaiting plain film to ensure no obvious foreign body or osteo.  X-ray with question of early osteo-, will admit to medicine service.  Barth Kirks. Sedonia Small, MD [place] [email or web address]  Final Clinical Impressions(s) / ED Diagnoses     ICD-10-CM   1. Cellulitis of right upper extremity  L03.113     ED Discharge Orders    None       Discharge Instructions Discussed with and Provided to Patient:   Discharge Instructions   None       Maudie Flakes, MD 10/09/19 2005

## 2019-10-09 NOTE — ED Triage Notes (Signed)
Pt presents with swelling to right wrist and hand x2 days. Per informant (pt's sister) pt has not sustained any injury or trauma to wrist. Pt has been treating with benadryl with out relief. Wrist is starting to drain serous fluid. Pt endorses pain. Pt denies n/v, fevers chills, or body aches.

## 2019-10-09 NOTE — ED Notes (Signed)
[phone], his wife

## 2019-10-09 NOTE — ED Provider Notes (Signed)
[facility]    CSN: [id number] Arrival date & time: 10/09/19  1153      History   Chief Complaint No chief complaint on file.   HPI [name] is a 49 y.o. male.   Sister brought in pt significant health hx . States yesterday noticed a redness, swelling to rt hand with some draining to wrist area. Began having itching up his arm. Took benadryl with no relief. She is concerned of what may have causes this due to he does not drive due to new onset of seizures, has been in and out of hospital several times over the past few months.      Past Medical History:  Diagnosis Date   Acute blood loss anemia    Cirrhosis ([place])    Diabetes mellitus, type 2 ([place])    Duodenal ulcer    Fatty liver    Peptic ulcer disease    Seizures ([place])     Patient Active Problem List   Diagnosis Date Noted   Elevated blood pressure reading 08/24/2019   Memory changes 05/25/2019   Non-traumatic rhabdomyolysis    Acute GI bleeding 04/21/2019   Diabetes mellitus type 2 in nonobese ([place]) 04/21/2019   Seizures ([place]) 04/19/2019   Acute blood loss anemia    Duodenal ulcer hemorrhage    Acute gastric ulcer without hemorrhage or perforation    Acute renal failure (ARF) ([place]) 25/85/2778   Acute metabolic encephalopathy 24/23/5361    Past Surgical History:  Procedure Laterality Date   BIOPSY  04/17/2019   Procedure: BIOPSY;  Surgeon: Irene Shipper, MD;  Location: [facility];  Service: Endoscopy;;   ESOPHAGOGASTRODUODENOSCOPY Left 04/21/2019   Procedure: ESOPHAGOGASTRODUODENOSCOPY (EGD);  Surgeon: Jerene Bears, MD;  Location: [facility] ENDOSCOPY;  Service: Gastroenterology;  Laterality: Left;   ESOPHAGOGASTRODUODENOSCOPY (EGD) WITH PROPOFOL N/A 04/17/2019   Procedure: ESOPHAGOGASTRODUODENOSCOPY (EGD) WITH PROPOFOL;  Surgeon: Irene Shipper, MD;  Location: [facility] ENDOSCOPY;  Service: Endoscopy;  Laterality: N/A;   ESOPHAGOGASTRODUODENOSCOPY (EGD) WITH PROPOFOL N/A 04/23/2019    Procedure: ESOPHAGOGASTRODUODENOSCOPY (EGD) WITH PROPOFOL;  Surgeon: Jerene Bears, MD;  Location[facility]COPY;  Service: Gastroenterology;  Laterality: N/A;   HEMOSTASIS CLIP PLACEMENT  04/17/2019   Procedure: HEMOSTASIS CLIP PLACEMENT;  Surgeon: Irene Shipper, MD;  Location: [facility] ENDOSCOPY;  Service: Endoscopy;;   HEMOSTASIS CLIP PLACEMENT  04/21/2019   Procedure: HEMOSTASIS CLIP PLACEMENT;  Surgeon: Jerene Bears, MD;  Location: [facility] ENDOSCOPY;  Service: Gastroenterology;;   IR FLUORO GUIDE CV LINE RIGHT  04/10/2019   IR REMOVAL TUN CV CATH W/O FL  04/17/2019   IR US GUIDE VASC ACCESS RIGHT  04/10/2019   SUBMUCOSAL INJECTION  04/17/2019   Procedure: SUBMUCOSAL INJECTION;  Surgeon: Irene Shipper, MD;  Location: [facility] ENDOSCOPY;  Service: Endoscopy;;       Home Medications    Prior to Admission medications   Medication Sig Start Date End Date Taking? Authorizing Provider  acetaminophen (TYLENOL) 500 MG tablet Take 1 tablet (500 mg total) by mouth every 6 (six) hours as needed for mild pain, moderate pain, fever or headache. 04/19/19  Yes Domenic Polite, MD  atorvastatin (LIPITOR) 10 MG tablet Take 1 tablet (10 mg total) by mouth daily. 05/25/19  Yes Ladell Pier, MD  glimepiride (AMARYL) 2 MG tablet Take 1 tablet (2 mg total) by mouth daily before breakfast. 05/25/19  Yes Ladell Pier, MD  levETIRAcetam (KEPPRA) 500 MG tablet Take 1 tablet (500 mg total) by mouth 2 (two)  times daily. 05/25/19  Yes Ladell Pier, MD  lisinopril (ZESTRIL) 10 MG tablet Take 1 tablet (10 mg total) by mouth daily. 09/21/19  Yes Ladell Pier, MD  pantoprazole (PROTONIX) 40 MG tablet Take 1 tablet (40 mg total) by mouth 2 (two) times daily. 04/30/19  Yes Ladell Pier, MD  fexofenadine (ALLEGRA) 180 MG tablet Take 180 mg by mouth daily as needed for allergies or rhinitis.    [provider]    Family History Family History  Problem Relation Age of Onset   Diabetes Mother    Diabetes  Sister    Esophageal cancer Neg Hx    Stomach cancer Neg Hx    Colon cancer Neg Hx    Pancreatic cancer Neg Hx     Social History Social History   Tobacco Use   Smoking status: Never Smoker   Smokeless tobacco: Never Used  Vaping Use   Vaping Use: Never used  Substance Use Topics   Alcohol use: Not Currently   Drug use: Not Currently    Types: Marijuana    Comment: 5 times per month (per 04/21/19 hospital note)     Allergies   Penicillins   Review of Systems Review of Systems  Constitutional: Positive for fatigue.  Respiratory: Negative.   Cardiovascular: Negative.   Musculoskeletal: Positive for joint swelling.  Skin: Positive for rash and wound.       Rt hand swelling, drainage from wrist , redness going up rt arm itching      Physical Exam Triage Vital Signs ED Triage Vitals  Enc Vitals Group     BP 10/09/19 1211 (!) 126/92     Pulse Rate 10/09/19 1211 (!) 103     Resp 10/09/19 1211 16     Temp 10/09/19 1211 (!) 97.4 F (36.3 C)     Temp src --      SpO2 10/09/19 1211 97 %     Weight --      Height --      Head Circumference --      Peak Flow --      Pain Score 10/09/19 1214 7     Pain Loc --      Pain Edu? --      Excl. in [place]? --    No data found.  Updated Vital Signs BP (!) 126/92 (BP Location: Right Arm)    Pulse (!) 103    Temp (!) 97.4 F (36.3 C)    Resp 16    SpO2 97%   Visual Acuity     Physical Exam Constitutional:      Appearance: He is ill-appearing.  Cardiovascular:     Rate and Rhythm: Tachycardia present.  Pulmonary:     Effort: Pulmonary effort is normal.  Musculoskeletal:        General: Swelling and tenderness present.     Comments: Rt wrist has a pin point area with clear drainage, erythema with +2 pitting edema noted to rt hand with streaking up rt hand , wrist and arm. Decrease ROM due to pain,   Skin:    Capillary Refill: Capillary refill takes 2 to 3 seconds.     Findings: Erythema, lesion and rash  present.  Neurological:     Motor: Weakness present.      UC Treatments / Results  Labs (all labs ordered are listed, but only abnormal results are displayed) Labs Reviewed - No data to display  EKG   Radiology No  results found.  Procedures Procedures (including critical care time)  Medications Ordered in UC Medications - No data to display  Initial Impression / Assessment and Plan / UC Course  I have reviewed the triage vital signs and the nursing notes.  Pertinent labs & imaging results that were available during my care of the patient were reviewed by me and considered in my medical decision making (see chart for details).     Spoke with md nelson about pt due to pt hx and stability felt as tho pt needs to be seen in ER to r/o sepsis and receive IV abx therapy.  Reviewed previous chart and med hx  Sister at bedside and agree with tx and is ok with taking to [facility]  Final Clinical Impressions(s) / UC Diagnoses   Final diagnoses:  Cellulitis of right upper extremity     Discharge Instructions     Sister preferred to take the [facility] er due to MD  Will need to r/o spesis and need for iv abx     ED Prescriptions    None     PDMP not reviewed this encounter.   Marney Setting, NP 10/09/19 1249

## 2019-10-10 DIAGNOSIS — G40909 Epilepsy, unspecified, not intractable, without status epilepticus: Secondary | ICD-10-CM | POA: Diagnosis present

## 2019-10-10 DIAGNOSIS — Z20822 Contact with and (suspected) exposure to covid-19: Secondary | ICD-10-CM | POA: Diagnosis present

## 2019-10-10 DIAGNOSIS — Z833 Family history of diabetes mellitus: Secondary | ICD-10-CM | POA: Diagnosis not present

## 2019-10-10 DIAGNOSIS — Z880 Allergy status to penicillin: Secondary | ICD-10-CM | POA: Diagnosis not present

## 2019-10-10 DIAGNOSIS — E119 Type 2 diabetes mellitus without complications: Secondary | ICD-10-CM | POA: Diagnosis present

## 2019-10-10 DIAGNOSIS — L03113 Cellulitis of right upper limb: Secondary | ICD-10-CM | POA: Diagnosis present

## 2019-10-10 DIAGNOSIS — K760 Fatty (change of) liver, not elsewhere classified: Secondary | ICD-10-CM | POA: Diagnosis present

## 2019-10-10 DIAGNOSIS — L02511 Cutaneous abscess of right hand: Secondary | ICD-10-CM | POA: Diagnosis present

## 2019-10-10 DIAGNOSIS — Z79899 Other long term (current) drug therapy: Secondary | ICD-10-CM | POA: Diagnosis not present

## 2019-10-10 DIAGNOSIS — M7989 Other specified soft tissue disorders: Secondary | ICD-10-CM | POA: Diagnosis present

## 2019-10-10 DIAGNOSIS — Z8673 Personal history of transient ischemic attack (TIA), and cerebral infarction without residual deficits: Secondary | ICD-10-CM | POA: Diagnosis not present

## 2019-10-10 DIAGNOSIS — Z7984 Long term (current) use of oral hypoglycemic drugs: Secondary | ICD-10-CM | POA: Diagnosis not present

## 2019-10-10 DIAGNOSIS — Z8711 Personal history of peptic ulcer disease: Secondary | ICD-10-CM | POA: Diagnosis not present

## 2019-10-10 LAB — URINALYSIS, ROUTINE W REFLEX MICROSCOPIC
Bilirubin Urine: NEGATIVE
Glucose, UA: NEGATIVE mg/dL
Hgb urine dipstick: NEGATIVE
Ketones, ur: NEGATIVE mg/dL
Leukocytes,Ua: NEGATIVE
Nitrite: NEGATIVE
Protein, ur: NEGATIVE mg/dL
Specific Gravity, Urine: 1.019 (ref 1.005–1.030)
pH: 5 (ref 5.0–8.0)

## 2019-10-10 LAB — CBC
HCT: 39.1 % (ref 39.0–52.0)
Hemoglobin: 12.6 g/dL — ABNORMAL LOW (ref 13.0–17.0)
MCH: 25.3 pg — ABNORMAL LOW (ref 26.0–34.0)
MCHC: 32.2 g/dL (ref 30.0–36.0)
MCV: 78.5 fL — ABNORMAL LOW (ref 80.0–100.0)
Platelets: 209 10*3/uL (ref 150–400)
RBC: 4.98 MIL/uL (ref 4.22–5.81)
RDW: 12.5 % (ref 11.5–15.5)
WBC: 7.3 10*3/uL (ref 4.0–10.5)
nRBC: 0 % (ref 0.0–0.2)

## 2019-10-10 LAB — GLUCOSE, CAPILLARY
Glucose-Capillary: 101 mg/dL — ABNORMAL HIGH (ref 70–99)
Glucose-Capillary: 102 mg/dL — ABNORMAL HIGH (ref 70–99)
Glucose-Capillary: 113 mg/dL — ABNORMAL HIGH (ref 70–99)
Glucose-Capillary: 187 mg/dL — ABNORMAL HIGH (ref 70–99)
Glucose-Capillary: 219 mg/dL — ABNORMAL HIGH (ref 70–99)
Glucose-Capillary: 94 mg/dL (ref 70–99)

## 2019-10-10 LAB — COMPREHENSIVE METABOLIC PANEL
ALT: 37 U/L (ref 0–44)
AST: 24 U/L (ref 15–41)
Albumin: 3.7 g/dL (ref 3.5–5.0)
Alkaline Phosphatase: 64 U/L (ref 38–126)
Anion gap: 11 (ref 5–15)
BUN: 17 mg/dL (ref 6–20)
CO2: 23 mmol/L (ref 22–32)
Calcium: 8.6 mg/dL — ABNORMAL LOW (ref 8.9–10.3)
Chloride: 104 mmol/L (ref 98–111)
Creatinine, Ser: 1.04 mg/dL (ref 0.61–1.24)
GFR calc Af Amer: >60 mL/min (ref >60)
GFR calc non Af Amer: >60 mL/min (ref >60)
Glucose, Bld: 189 mg/dL — ABNORMAL HIGH (ref 70–99)
Potassium: 3.6 mmol/L (ref 3.5–5.1)
Sodium: 138 mmol/L (ref 135–145)
Total Bilirubin: 0.3 mg/dL (ref 0.3–1.2)
Total Protein: 5.8 g/dL — ABNORMAL LOW (ref 6.5–8.1)

## 2019-10-10 MED ORDER — SODIUM CHLORIDE 0.9 % IV SOLN
1.0000 g | INTRAVENOUS | Status: DC
  Administered 2019-10-10: 1 g via INTRAVENOUS
  Filled 2019-10-10: qty 10

## 2019-10-10 NOTE — Progress Notes (Signed)
[facility] PROGRESS NOTE    Progress Note  [name]  FIE:[id number] DOB: 11-Nov-1970 DOA: 10/09/2019 PCP: Ladell Pier, MD     Brief Narrative:   [name] is an 49 y.o. male past medical history significant of diabetes mellitus type 2 with an A1c of 7.3, peptic ulcer disease, seizure disorder TIA who comes in with a swelling of the right hand and wrist, he reports that the swelling and pain started the day prior to admission.  Assessment/Plan:   Cellulitis of right wrist: Blood cultures have been ordered and are negative till date, he was started on IV Rocephin. Continue ibuprofen for pain. Pain with passive movement of the fifth and fourth right hand phalanges. Check an MRI to rule out osteomyelitis. We will consult hand surgery.  Controlled diabetes mellitus type 2 with an A1c of 7.3. Hold oral hypoglycemic agents continue sliding scale.  Seizures ([place]): Continue current home dose of Keppra no events in house.  Memory changes     DVT prophylaxis: lovenox Family Communication:none Status is: Observation  The patient will require care spanning > 2 midnights and should be moved to inpatient because: Hemodynamically unstable  Dispo: The patient is from: Home              Anticipated d/c is to: Home              Anticipated d/c date is: 3 days              Patient currently is not medically stable to d/c.        Code Status:     Code Status Orders  (From admission, onward)         Start     Ordered   10/09/19 2214  Full code  Continuous        10/09/19 2214        Code Status History    Date Active Date Inactive Code Status Order ID Comments User Context   04/21/2019 0519 04/23/2019 2346 Full Code [id number]  Rise Patience, MD ED   04/05/2019 1937 04/19/2019 2230 Full Code [id number]  Lenore Cordia, MD ED   Advance Care Planning Activity        IV Access:    Peripheral IV   Procedures and diagnostic studies:   DG Hand  Complete Right  Result Date: 10/09/2019 CLINICAL DATA:  Cellulitis to right hand EXAM: RIGHT HAND - COMPLETE 3+ VIEW COMPARISON:  None. FINDINGS: Diffuse edematous changes seen most prominently across the medial aspect of the hand and wrist. Lucency is seen adjacent the base of the fifth metacarpal and along the medial aspect of the hamate, nonspecific though given the overlying inflammatory features, infection is not excluded. Findings on a background of diffuse mild arthrosis throughout the hand and wrist. No other suspicious osseous lesions. No fracture, subluxation, or dislocation. IMPRESSION: 1. Diffuse edematous changes most prominently across the medial aspect of the hand and wrist. 2. Lucency adjacent the base of the fifth metacarpal and along the medial aspect of the hamate, nonspecific though given the overlying inflammatory features, infection/early osteomyelitis is not excluded. Electronically Signed   By: Lovena Le M.D.   On: 10/09/2019 19:31     Medical Consultants:    None.  Anti-Infectives:   IV Rocephin  Subjective:    [name] he relates the arm and wrist continue to be painful.  Objective:    Vitals:   10/09/19 2019 10/09/19 2214 10/10/19 0029 10/10/19  0409  BP: 115/71 (!) 149/92 127/81 125/86  Pulse: 91 75 65 70  Resp: 17 16 17 16   Temp:  97.9 F (36.6 C) 98.3 F (36.8 C) 97.6 F (36.4 C)  TempSrc:  Oral Oral Oral  SpO2: 99% 100% 99% 99%  Weight:      Height:       SpO2: 99 %   Intake/Output Summary (Last 24 hours) at 10/10/2019 0753 Last data filed at 10/10/2019 0300 Gross per 24 hour  Intake 311.96 ml  Output --  Net 311.96 ml   Filed Weights   10/09/19 1836  Weight: 68 kg    Exam: General exam: In no acute distress. Respiratory system: Good air movement and clear to auscultation. Cardiovascular system: S1 & S2 heard, RRR. No JVD. Gastrointestinal system: Abdomen is nondistended, soft and nontender.  Extremities: Has pain with passive  movement on flexion of the fifth and fourth phalanges Skin: Redness on the medial dorsal right wrist with some streak of redness trending up to the elbow. Psychiatry: Judgement and insight appear normal. Mood & affect appropriate.    Data Reviewed:    Labs: Basic Metabolic Panel: Recent Labs  Lab 10/09/19 1304 10/10/19 0027  NA 139 138  K 4.0 3.6  CL 105 104  CO2 25 23  GLUCOSE 85 189*  BUN 18 17  CREATININE 0.93 1.04  CALCIUM 9.6 8.6*   GFR Estimated Creatinine Clearance: 81.7 mL/min (by C-G formula based on SCr of 1.04 mg/dL). Liver Function Tests: Recent Labs  Lab 10/09/19 1304 10/10/19 0027  AST 26 24  ALT 41 37  ALKPHOS 73 64  BILITOT 0.6 0.3  PROT 6.8 5.8*  ALBUMIN 4.5 3.7   No results for input(s): LIPASE, AMYLASE in the last 168 hours. No results for input(s): AMMONIA in the last 168 hours. Coagulation profile No results for input(s): INR, PROTIME in the last 168 hours. COVID-19 Labs  No results for input(s): DDIMER, FERRITIN, LDH, CRP in the last 72 hours.  Lab Results  Component Value Date   SARSCOV2NAA NEGATIVE 10/09/2019   SARSCOV2NAA RESULT: NEGATIVE 08/05/2019   SARSCOV2NAA NEGATIVE 04/21/2019   [place] NEGATIVE 04/05/2019    CBC: Recent Labs  Lab 10/09/19 1304 10/10/19 0027  WBC 6.9 7.3  NEUTROABS 3.2  --   HGB 13.6 12.6*  HCT 42.8 39.1  MCV 78.1* 78.5*  PLT 242 209   Cardiac Enzymes: No results for input(s): CKTOTAL, CKMB, CKMBINDEX, TROPONINI in the last 168 hours. BNP (last 3 results) No results for input(s): PROBNP in the last 8760 hours. CBG: Recent Labs  Lab 10/09/19 1302 10/09/19 1605 10/10/19 0019 10/10/19 0409 10/10/19 0743  GLUCAP 75 76 187* 102* 101*   D-Dimer: No results for input(s): DDIMER in the last 72 hours. Hgb A1c: Recent Labs    10/09/19 2321  HGBA1C 7.3*   Lipid Profile: No results for input(s): CHOL, HDL, LDLCALC, TRIG, CHOLHDL, LDLDIRECT in the last 72 hours. Thyroid function  studies: No results for input(s): TSH, T4TOTAL, T3FREE, THYROIDAB in the last 72 hours.  Invalid input(s): FREET3 Anemia work up: No results for input(s): VITAMINB12, FOLATE, FERRITIN, TIBC, IRON, RETICCTPCT in the last 72 hours. Sepsis Labs: Recent Labs  Lab 10/09/19 1304 10/10/19 0027  WBC 6.9 7.3  LATICACIDVEN 1.2  --    Microbiology Recent Results (from the past 240 hour(s))  SARS Coronavirus 2 by RT PCR (hospital order, performed in [facility] hospital lab) Nasopharyngeal Nasopharyngeal Swab     Status: None  Collection Time: 10/09/19  8:20 PM   Specimen: Nasopharyngeal Swab  Result Value Ref Range Status   SARS Coronavirus 2 NEGATIVE NEGATIVE Final    Comment: (NOTE) SARS-CoV-2 target nucleic acids are NOT DETECTED.  The SARS-CoV-2 RNA is generally detectable in upper and lower respiratory specimens during the acute phase of infection. The lowest concentration of SARS-CoV-2 viral copies this assay can detect is 250 copies / mL. A negative result does not preclude SARS-CoV-2 infection and should not be used as the sole basis for treatment or other patient management decisions.  A negative result may occur with improper specimen collection / handling, submission of specimen other than nasopharyngeal swab, presence of viral mutation(s) within the areas targeted by this assay, and inadequate number of viral copies (<250 copies / mL). A negative result must be combined with clinical observations, patient history, and epidemiological information.  Fact Sheet for Patients:   StrictlyIdeas.no  Fact Sheet for Healthcare Providers: BankingDealers.co.za  This test is not yet approved or  cleared by the [place] FDA and has been authorized for detection and/or diagnosis of SARS-CoV-2 by FDA under an Emergency Use Authorization (EUA).  This EUA will remain in effect (meaning this test can be used) for the duration of  the COVID-19 declaration under Section 564(b)(1) of the Act, 21 U.S.C. section 360bbb-3(b)(1), unless the authorization is terminated or revoked sooner.  Performed at [facility], [place] [address] [place], [place] [address]      Medications:   . atorvastatin  10 mg Oral Daily  . cephALEXin  500 mg Oral Q6H  . enoxaparin (LOVENOX) injection  40 mg Subcutaneous Q24H  . insulin aspart  0-5 Units Subcutaneous QHS  . insulin aspart  0-9 Units Subcutaneous TID WC  . levETIRAcetam  500 mg Oral BID  . lisinopril  10 mg Oral Daily  . pantoprazole  40 mg Oral Daily  . sodium chloride flush  3 mL Intravenous Once  . sodium chloride flush  3 mL Intravenous Q12H   Continuous Infusions:    LOS: 0 days   Charlynne Cousins  [facility]  10/10/2019, 7:53 AM

## 2019-10-10 NOTE — Brief Op Note (Signed)
*   No surgery found *  11:44 AM  PATIENT:  [name]  49 y.o. male  PRE-OPERATIVE DIAGNOSIS:  Abscess/cellulitis R hand POST-OPERATIVE DIAGNOSIS:  same  PROCEDURE: the dorsal hand was cleansed with betadine.  Local anesthetic was infiltrated around the area of concern.  A small incision was made overlying the area of concern thru the skin.  No pus was encountered.  The wound was manipulated.  A sterile dressing was placed.  The patient tolerated the procedure well. SURGEON: [employer]

## 2019-10-10 NOTE — Consult Note (Signed)
Reason for Consult:cellulitis Referring Physician: [facility]  CC:my arm itches  HPI:  [name] is an 49 y.o. right handed male who presents with pain and swelling over ulnar styloid of right dorsal hand for several days.  Pt states at one time it was draining, thought it was a mosquito bite.  Hand became worse and presented to ER.  Currently on Rocephin.       .   Pain is rated at   6 /10 and is described as dull.  Pain is constant.  Pain is made better by rest/immobilization, worse with motion.   Associated signs/symptoms:diabetes Previous treatment:    Past Medical History:  Diagnosis Date  . Diabetes mellitus, type 2 ([place])   . Duodenal ulcer   . Fatty liver   . Peptic ulcer disease   . Psoriasis   . Seizures ([place])     Past Surgical History:  Procedure Laterality Date  . BIOPSY  04/17/2019   Procedure: BIOPSY;  Surgeon: Irene Shipper, MD;  Location: [facility] ENDOSCOPY;  Service: Endoscopy;;  . ESOPHAGOGASTRODUODENOSCOPY Left 04/21/2019   Procedure: ESOPHAGOGASTRODUODENOSCOPY (EGD);  Surgeon: Jerene Bears, MD;  Location: [facility] ENDOSCOPY;  Service: Gastroenterology;  Laterality: Left;  . ESOPHAGOGASTRODUODENOSCOPY (EGD) WITH PROPOFOL N/A 04/17/2019   Procedure: ESOPHAGOGASTRODUODENOSCOPY (EGD) WITH PROPOFOL;  Surgeon: Irene Shipper, MD;  Location: [facility] ENDOSCOPY;  Service: Endoscopy;  Laterality: N/A;  . ESOPHAGOGASTRODUODENOSCOPY (EGD) WITH PROPOFOL N/A 04/23/2019   Procedure: ESOPHAGOGASTRODUODENOSCOPY (EGD) WITH PROPOFOL;  Surgeon: Jerene Bears, MD;  Location: [facility] ENDOSCOPY;  Service: Gastroenterology;  Laterality: N/A;  . HEMOSTASIS CLIP PLACEMENT  04/17/2019   Procedure: HEMOSTASIS CLIP PLACEMENT;  Surgeon: Irene Shipper, MD;  Location: [facility] ENDOSCOPY;  Service: Endoscopy;;  . HEMOSTASIS CLIP PLACEMENT  04/21/2019   Procedure: HEMOSTASIS CLIP PLACEMENT;  Surgeon: Jerene Bears, MD;  Location: [facility] ENDOSCOPY;  Service: Gastroenterology;;  . IR FLUORO GUIDE CV LINE RIGHT  04/10/2019  . IR REMOVAL TUN  CV CATH W/O FL  04/17/2019  . IR US GUIDE VASC ACCESS RIGHT  04/10/2019  . SUBMUCOSAL INJECTION  04/17/2019   Procedure: SUBMUCOSAL INJECTION;  Surgeon: Irene Shipper, MD;  Location: [facility] ENDOSCOPY;  Service: Endoscopy;;    Family History  Problem Relation Age of Onset  . Diabetes Mother   . Diabetes Sister   . Diabetes Father   . Esophageal cancer Neg Hx   . Stomach cancer Neg Hx   . Colon cancer Neg Hx   . Pancreatic cancer Neg Hx     Social History:  reports that he has never smoked. He has never used smokeless tobacco. He reports previous alcohol use. He reports previous drug use. Drug: Marijuana.  Allergies:  Allergies  Allergen Reactions  . Penicillins Hives and Other (See Comments)    Childhood allergy  Did it involve swelling of the face/tongue/throat, SOB, or low BP? Unknown Did it involve sudden or severe rash/hives, skin peeling, or any reaction on the inside of your mouth or nose? Unknown Did you need to seek medical attention at a hospital or doctor's office? Unknown When did it last happen?Pt was a child If all above answers are "NO", may proceed with cephalosporin use. Sister states he just breaks out in hives unsure about other reactions.      Medications: I have reviewed the patient's current medications.  Results for orders placed or performed during the hospital encounter of 10/09/19 (from the past 48 hour(s))  CBG monitoring, ED     Status:  None   Collection Time: 10/09/19  1:02 PM  Result Value Ref Range   Glucose-Capillary 75 70 - 99 mg/dL    Comment: Glucose reference range applies only to samples taken after fasting for at least 8 hours.  Lactic acid, plasma     Status: None   Collection Time: 10/09/19  1:04 PM  Result Value Ref Range   Lactic Acid, Venous 1.2 0.5 - 1.9 mmol/L    Comment: Performed at [facility], [place] [address] [place], [place] [address]  Comprehensive metabolic panel     Status: None   Collection Time: 10/09/19  1:04 PM   Result Value Ref Range   Sodium 139 135 - 145 mmol/L   Potassium 4.0 3.5 - 5.1 mmol/L   Chloride 105 98 - 111 mmol/L   CO2 25 22 - 32 mmol/L   Glucose, Bld 85 70 - 99 mg/dL    Comment: Glucose reference range applies only to samples taken after fasting for at least 8 hours.   BUN 18 6 - 20 mg/dL   Creatinine, Ser 0.93 0.61 - 1.24 mg/dL   Calcium 9.6 8.9 - 10.3 mg/dL   Total Protein 6.8 6.5 - 8.1 g/dL   Albumin 4.5 3.5 - 5.0 g/dL   AST 26 15 - 41 U/L   ALT 41 0 - 44 U/L   Alkaline Phosphatase 73 38 - 126 U/L   Total Bilirubin 0.6 0.3 - 1.2 mg/dL   GFR calc non Af Amer >60 >60 mL/min   GFR calc Af Amer >60 >60 mL/min   Anion gap 9 5 - 15    Comment: Performed at [facility], [place] [address], [place], [place] [address]  CBC with Differential     Status: Abnormal   Collection Time: 10/09/19  1:04 PM  Result Value Ref Range   WBC 6.9 4.0 - 10.5 K/uL   RBC 5.48 4.22 - 5.81 MIL/uL   Hemoglobin 13.6 13.0 - 17.0 g/dL   HCT 42.8 39 - 52 %   MCV 78.1 (L) 80.0 - 100.0 fL   MCH 24.8 (L) 26.0 - 34.0 pg   MCHC 31.8 30.0 - 36.0 g/dL   RDW 12.3 11.5 - 15.5 %   Platelets 242 150 - 400 K/uL   nRBC 0.0 0.0 - 0.2 %   Neutrophils Relative % 46 %   Neutro Abs 3.2 1.7 - 7.7 K/uL   Lymphocytes Relative 30 %   Lymphs Abs 2.1 0.7 - 4.0 K/uL   Monocytes Relative 10 %   Monocytes Absolute 0.7 0 - 1 K/uL   Eosinophils Relative 13 %   Eosinophils Absolute 0.9 (H) 0 - 0 K/uL   Basophils Relative 1 %   Basophils Absolute 0.1 0 - 0 K/uL   Immature Granulocytes 0 %   Abs Immature Granulocytes 0.03 0.00 - 0.07 K/uL    Comment: Performed at [facility], [place] [address], [place], [place] [address]  CBG monitoring, ED     Status: None   Collection Time: 10/09/19  4:05 PM  Result Value Ref Range   Glucose-Capillary 76 70 - 99 mg/dL    Comment: Glucose reference range applies only to samples taken after fasting for at least 8 hours.  SARS Coronavirus 2 by RT PCR (hospital order, performed in  [facility] hospital lab) Nasopharyngeal Nasopharyngeal Swab     Status: None   Collection Time: 10/09/19  8:20 PM   Specimen: Nasopharyngeal Swab  Result  Value Ref Range   SARS Coronavirus 2 NEGATIVE NEGATIVE    Comment: (NOTE) SARS-CoV-2 target nucleic acids are NOT DETECTED.  The SARS-CoV-2 RNA is generally detectable in upper and lower respiratory specimens during the acute phase of infection. The lowest concentration of SARS-CoV-2 viral copies this assay can detect is 250 copies / mL. A negative result does not preclude SARS-CoV-2 infection and should not be used as the sole basis for treatment or other patient management decisions.  A negative result may occur with improper specimen collection / handling, submission of specimen other than nasopharyngeal swab, presence of viral mutation(s) within the areas targeted by this assay, and inadequate number of viral copies (<250 copies / mL). A negative result must be combined with clinical observations, patient history, and epidemiological information.  Fact Sheet for Patients:   StrictlyIdeas.no  Fact Sheet for Healthcare Providers: BankingDealers.co.za  This test is not yet approved or  cleared by the [place] FDA and has been authorized for detection and/or diagnosis of SARS-CoV-2 by FDA under an Emergency Use Authorization (EUA).  This EUA will remain in effect (meaning this test can be used) for the duration of the COVID-19 declaration under Section 564(b)(1) of the Act, 21 U.S.C. section 360bbb-3(b)(1), unless the authorization is terminated or revoked sooner.  Performed at [facility], [place] [address], [place], [place] [address]   Hemoglobin A1c     Status: Abnormal   Collection Time: 10/09/19 11:21 PM  Result Value Ref Range   Hgb A1c MFr Bld 7.3 (H) 4.8 - 5.6 %    Comment: (NOTE) Pre diabetes:          5.7%-6.4%  Diabetes:              >6.4%  Glycemic  control for   <7.0% adults with diabetes    Mean Plasma Glucose 162.81 mg/dL    Comment: Performed at [facility] [address], [place], [place] [address]  Urinalysis, Routine w reflex microscopic     Status: None   Collection Time: 10/09/19 11:43 PM  Result Value Ref Range   Color, Urine YELLOW YELLOW   APPearance CLEAR CLEAR   Specific Gravity, Urine 1.019 1.005 - 1.030   pH 5.0 5.0 - 8.0   Glucose, UA NEGATIVE NEGATIVE mg/dL   Hgb urine dipstick NEGATIVE NEGATIVE   Bilirubin Urine NEGATIVE NEGATIVE   Ketones, ur NEGATIVE NEGATIVE mg/dL   Protein, ur NEGATIVE NEGATIVE mg/dL   Nitrite NEGATIVE NEGATIVE   Leukocytes,Ua NEGATIVE NEGATIVE    Comment: Performed at [facility] [address] [place], [place] [address]  Glucose, capillary     Status: Abnormal   Collection Time: 10/10/19 12:19 AM  Result Value Ref Range   Glucose-Capillary 187 (H) 70 - 99 mg/dL    Comment: Glucose reference range applies only to samples taken after fasting for at least 8 hours.  Comprehensive metabolic panel     Status: Abnormal   Collection Time: 10/10/19 12:27 AM  Result Value Ref Range   Sodium 138 135 - 145 mmol/L   Potassium 3.6 3.5 - 5.1 mmol/L   Chloride 104 98 - 111 mmol/L   CO2 23 22 - 32 mmol/L   Glucose, Bld 189 (H) 70 - 99 mg/dL    Comment: Glucose reference range applies only to samples taken after fasting for at least 8 hours.   BUN 17 6 - 20 mg/dL   Creatinine, Ser 1.04 0.61 - 1.24 mg/dL  Calcium 8.6 (L) 8.9 - 10.3 mg/dL   Total Protein 5.8 (L) 6.5 - 8.1 g/dL   Albumin 3.7 3.5 - 5.0 g/dL   AST 24 15 - 41 U/L   ALT 37 0 - 44 U/L   Alkaline Phosphatase 64 38 - 126 U/L   Total Bilirubin 0.3 0.3 - 1.2 mg/dL   GFR calc non Af Amer >60 >60 mL/min   GFR calc Af Amer >60 >60 mL/min   Anion gap 11 5 - 15    Comment: Performed at [facility] [address], [place], [place] [address]  CBC     Status: Abnormal   Collection Time: 10/10/19 12:27 AM  Result  Value Ref Range   WBC 7.3 4.0 - 10.5 K/uL   RBC 4.98 4.22 - 5.81 MIL/uL   Hemoglobin 12.6 (L) 13.0 - 17.0 g/dL   HCT 39.1 39 - 52 %   MCV 78.5 (L) 80.0 - 100.0 fL   MCH 25.3 (L) 26.0 - 34.0 pg   MCHC 32.2 30.0 - 36.0 g/dL   RDW 12.5 11.5 - 15.5 %   Platelets 209 150 - 400 K/uL   nRBC 0.0 0.0 - 0.2 %    Comment: Performed at [facility], [place] [address], [place], [place] [address]  Glucose, capillary     Status: Abnormal   Collection Time: 10/10/19  4:09 AM  Result Value Ref Range   Glucose-Capillary 102 (H) 70 - 99 mg/dL    Comment: Glucose reference range applies only to samples taken after fasting for at least 8 hours.  Glucose, capillary     Status: Abnormal   Collection Time: 10/10/19  7:43 AM  Result Value Ref Range   Glucose-Capillary 101 (H) 70 - 99 mg/dL    Comment: Glucose reference range applies only to samples taken after fasting for at least 8 hours.    DG Hand Complete Right  Result Date: 10/09/2019 CLINICAL DATA:  Cellulitis to right hand EXAM: RIGHT HAND - COMPLETE 3+ VIEW COMPARISON:  None. FINDINGS: Diffuse edematous changes seen most prominently across the medial aspect of the hand and wrist. Lucency is seen adjacent the base of the fifth metacarpal and along the medial aspect of the hamate, nonspecific though given the overlying inflammatory features, infection is not excluded. Findings on a background of diffuse mild arthrosis throughout the hand and wrist. No other suspicious osseous lesions. No fracture, subluxation, or dislocation. IMPRESSION: 1. Diffuse edematous changes most prominently across the medial aspect of the hand and wrist. 2. Lucency adjacent the base of the fifth metacarpal and along the medial aspect of the hamate, nonspecific though given the overlying inflammatory features, infection/early osteomyelitis is not excluded. Electronically Signed   By: Lovena Le M.D.   On: 10/09/2019 19:31    Pertinent items are noted in HPI. Temp:  [97.4 F  (36.3 C)-98.3 F (36.8 C)] 98.2 F (36.8 C) (07/03 0807) Pulse Rate:  [65-103] 69 (07/03 0807) Resp:  [16-18] 16 (07/03 0409) BP: (111-149)/(71-92) 121/79 (07/03 0807) SpO2:  [97 %-100 %] 100 % (07/03 0807) Weight:  [68 kg] 68 kg (07/02 1836) General appearance: alert and cooperative Resp: clear to auscultation bilaterally Cardio: regular rate and rhythm Extremities: R dorsal hand with some erythema up forearm, min swelling, focus sems to be small area over dorsal ulnar styloid,  some discomfort with rom of ulnar figers., min pain with wrist rom   Assessment: Superficial abscess R dorsal hand Plan: Will I&d at  bedside; cont antibiotics, would prob cover more specifically for staph Wound care - wash wound with soap and water, manipulate to promote drainage and cover.  Verdon Ferrante C Sandhya Denherder 10/10/2019, 11:36 AM

## 2019-10-10 NOTE — Plan of Care (Signed)
?  Problem: Education: ?Goal: Knowledge of General Education information will improve ?Description: Including pain rating scale, medication(s)/side effects and non-pharmacologic comfort measures ?Outcome: Progressing ?  ?Problem: Health Behavior/Discharge Planning: ?Goal: Ability to manage health-related needs will improve ?Outcome: Progressing ?  ?Problem: Clinical Measurements: ?Goal: Will remain free from infection ?Outcome: Progressing ?  ?Problem: Activity: ?Goal: Risk for activity intolerance will decrease ?Outcome: Progressing ?  ?Problem: Nutrition: ?Goal: Adequate nutrition will be maintained ?Outcome: Progressing ?  ?Problem: Elimination: ?Goal: Will not experience complications related to bowel motility ?Outcome: Progressing ?  ?Problem: Pain Managment: ?Goal: General experience of comfort will improve ?Outcome: Progressing ?  ?Problem: Safety: ?Goal: Ability to remain free from injury will improve ?Outcome: Progressing ?  ?

## 2019-10-11 DIAGNOSIS — E119 Type 2 diabetes mellitus without complications: Secondary | ICD-10-CM

## 2019-10-11 DIAGNOSIS — L03113 Cellulitis of right upper limb: Principal | ICD-10-CM

## 2019-10-11 LAB — GLUCOSE, CAPILLARY: Glucose-Capillary: 160 mg/dL — ABNORMAL HIGH (ref 70–99)

## 2019-10-11 MED ORDER — DOXYCYCLINE HYCLATE 100 MG PO TABS
100.0000 mg | ORAL_TABLET | Freq: Two times a day (BID) | ORAL | Status: DC
  Administered 2019-10-11: 100 mg via ORAL
  Filled 2019-10-11: qty 1

## 2019-10-11 MED ORDER — CALCIUM CARBONATE ANTACID 500 MG PO CHEW
1.0000 | CHEWABLE_TABLET | Freq: Two times a day (BID) | ORAL | Status: DC
  Administered 2019-10-11 (×2): 200 mg via ORAL
  Filled 2019-10-11 (×2): qty 1

## 2019-10-11 MED ORDER — DOXYCYCLINE HYCLATE 100 MG PO TABS: 100.0000 mg | ORAL_TABLET | Freq: Two times a day (BID) | ORAL | 0 refills | Status: DC

## 2019-10-11 NOTE — Progress Notes (Addendum)
Patient has been having one complaint after another.  First it was itching in the right arm.  Gave Atarax 25 mg PO early before it was due.  Then patient complained of left chest pain.  Suggested that patient get OOB and move while doing so patient started belching and burping.  Explained to patient that he may be having some flatus that needs to come out and laying in the same position is not helping his situation. His vitals are WNL.  Will continue to monitor.

## 2019-10-11 NOTE — Discharge Summary (Signed)
Physician Discharge Summary  [name] JGG:[id number] DOB: 1971-01-04 DOA: 10/09/2019  PCP: Ladell Pier, MD  Admit date: 10/09/2019 Discharge date: 10/11/2019  Admitted From: Home Disposition:  Home  Recommendations for Outpatient Follow-up:  1. Follow up with PCP in 1-2 weeks   Home Health:No Equipment/Devices:None  Discharge Condition:Stable CODE STATUS:Full Diet recommendation: Heart Healthy    Brief/Interim Summary: 49 y.o. male past medical history significant of diabetes mellitus type 2 with an A1c of 7.3, peptic ulcer disease, seizure disorder TIA who comes in with a swelling of the right hand and wrist, he reports that the swelling and pain started the day prior to admission  Discharge Diagnoses:  Active Problems:   Seizures ([place])   Diabetes mellitus type 2 in nonobese [facility])   Memory changes   Cellulitis of right wrist   Cellulitis of right upper extremity Cellulitis of the right wrist: Blood cultures have been negative, on admission he was started on IV Rocephin ibuprofen for pain. Hand surgery was consulted and recommended no further intervention. Continue total 7 days of oral antibiotics as an outpatient.  Controlled diabetes mellitus type 2 with a hemoglobin A1c of 7.3: No changes made to his medication.  Seizure disorder: Continue current home [place]   Discharge Instructions  Discharge Instructions    Diet - low sodium heart healthy   Complete by: As directed    Increase activity slowly   Complete by: As directed    No wound care   Complete by: As directed      Allergies as of 10/11/2019      Reactions   Penicillins Hives, Other (See Comments)   Childhood allergy  Did it involve swelling of the face/tongue/throat, SOB, or low BP? Unknown Did it involve sudden or severe rash/hives, skin peeling, or any reaction on the inside of your mouth or nose? Unknown Did you need to seek medical attention at a hospital or doctor's office? Unknown When did  it last happen?Pt was a child If all above answers are "NO", may proceed with cephalosporin use. Sister states he just breaks out in hives unsure about other reactions.       Medication List    TAKE these medications   acetaminophen 500 MG tablet Commonly known as: TYLENOL Take 1 tablet (500 mg total) by mouth every 6 (six) hours as needed for mild pain, moderate pain, fever or headache.   atorvastatin 10 MG tablet Commonly known as: LIPITOR Take 1 tablet (10 mg total) by mouth daily.   doxycycline 100 MG tablet Commonly known as: VIBRA-TABS Take 1 tablet (100 mg total) by mouth every 12 (twelve) hours.   fexofenadine 180 MG tablet Commonly known as: ALLEGRA Take 180 mg by mouth daily as needed for allergies or rhinitis.   glimepiride 2 MG tablet Commonly known as: AMARYL Take 1 tablet (2 mg total) by mouth daily before breakfast.   levETIRAcetam 500 MG tablet Commonly known as: KEPPRA Take 1 tablet (500 mg total) by mouth 2 (two) times daily.   lisinopril 10 MG tablet Commonly known as: ZESTRIL Take 1 tablet (10 mg total) by mouth daily.   pantoprazole 40 MG tablet Commonly known as: PROTONIX Take 1 tablet (40 mg total) by mouth 2 (two) times daily. What changed: when to take this       Allergies  Allergen Reactions  . Penicillins Hives and Other (See Comments)    Childhood allergy  Did it involve swelling of the face/tongue/throat, SOB, or low BP? Unknown  Did it involve sudden or severe rash/hives, skin peeling, or any reaction on the inside of your mouth or nose? Unknown Did you need to seek medical attention at a hospital or doctor's office? Unknown When did it last happen?Pt was a child If all above answers are "NO", may proceed with cephalosporin use. Sister states he just breaks out in hives unsure about other reactions.      Consultations:  Hand surgery   Procedures/Studies: EEG  Result Date: 10/05/2019 Cameron Sprang, MD      10/06/2019  9:20 AM ELECTROENCEPHALOGRAM REPORT Date of Study: 10/05/2019 Patient's Name: [name] MRN: [id number] Date of Birth: Sep 23, 1970 Referring Provider: Dr. Ellouise Newer Clinical History: This is a 49 year old man who presented with altered mental status, seizure with left gaze deviation, MRI showing PRES. He has continued cognitive deficits. Medications: KEPPRA 500 MG tablet TYLENOL 500 MG tablet LIPITOR 10 MG tablet 65 FE MG tablet ALLEGRA 180 MG tablet AMARYL 2 MG tablet PROTONIX 40 MG tablet Technical Summary: A multichannel digital 1-hour EEG recording measured by the international 10-20 system with electrodes applied with paste and impedances below 5000 ohms performed in our laboratory with EKG monitoring in an awake and asleep patient.  Hyperventilation was not performed. Photic stimulation was performed.  The digital EEG was referentially recorded, reformatted, and digitally filtered in a variety of bipolar and referential montages for optimal display.  Description: The patient is awake and asleep during the recording.  During maximal wakefulness, there is a symmetric, medium voltage 10 Hz posterior dominant rhythm that attenuates with eye opening.  The record is symmetric.  During drowsiness and sleep, there is an increase in theta slowing of the background, at times with shifting asymmetry over the bilateral temporal regions.  Vertex waves and symmetric sleep spindles were seen. Photic stimulation did not elicit any abnormalities.  There were no epileptiform discharges or electrographic seizures seen.  EKG lead was unremarkable. Impression: This 1-hour awake and asleep EEG is within normal limits. Clinical Correlation: A normal EEG does not exclude a clinical diagnosis of epilepsy.  If further clinical questions remain, prolonged EEG may be helpful.  Clinical correlation is advised. Ellouise Newer, M.D.   MR BRAIN W WO CONTRAST  Result Date: 09/14/2019 CLINICAL DATA:  Seizure disorder EXAM: MRI  HEAD WITHOUT AND WITH CONTRAST TECHNIQUE: Multiplanar, multiecho pulse sequences of the brain and surrounding structures were obtained without and with intravenous contrast. CONTRAST:  48mL MULTIHANCE GADOBENATE DIMEGLUMINE 529 MG/ML IV SOLN COMPARISON:  04/15/2019 FINDINGS: Brain: Scattered tiny areas of cortically based FLAIR hyperintensity along the high bilateral cerebral convexities correlating with areas of abnormal diffusion, although much less extensive. No acute infarct, recent blood products, hydrocephalus, or masslike finding. No abnormal intracranial enhancement. Brain volume is normal. Vascular: Normal flow voids and vascular enhancement Skull and upper cervical spine: Normal marrow signal Sinuses/Orbits: Unremarkable IMPRESSION: Small scattered areas of cortical encephalomalacia related to ictus 04/15/2019. 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 Electronically Signed   By: Monte Fantasia M.D.   On: 09/14/2019 09:53   DG Hand Complete Right  Result Date: 10/09/2019 CLINICAL DATA:  Cellulitis to right hand EXAM: RIGHT HAND - COMPLETE 3+ VIEW COMPARISON:  None. FINDINGS: Diffuse edematous changes seen most prominently across the medial aspect of the hand and wrist. Lucency is seen adjacent the base of the fifth metacarpal and along  the medial aspect of the hamate, nonspecific though given the overlying inflammatory features, infection is not excluded. Findings on a background of diffuse mild arthrosis throughout the hand and wrist. No other suspicious osseous lesions. No fracture, subluxation, or dislocation. IMPRESSION: 1. Diffuse edematous changes most prominently across the medial aspect of the hand and wrist. 2. Lucency adjacent the base of the fifth metacarpal and along the medial aspect of the hamate, nonspecific though given the overlying inflammatory  features, infection/early osteomyelitis is not excluded. Electronically Signed   By: Lovena Le M.D.   On: 10/09/2019 19:31   (Echo, Carotid, EGD, Colonoscopy, ERCP)    Subjective: No complaints Discharge Exam: Vitals:   10/10/19 2012 10/11/19 0300  BP: 115/79 115/79  Pulse: 94 78  Resp: 14 14  Temp: 97.9 F (36.6 C) 97.7 F (36.5 C)  SpO2: 98% 96%   Vitals:   10/10/19 1252 10/10/19 1646 10/10/19 2012 10/11/19 0300  BP: 131/87 (!) 129/99 115/79 115/79  Pulse: 81 97 94 78  Resp:   14 14  Temp: 98.3 F (36.8 C) 98.6 F (37 C) 97.9 F (36.6 C) 97.7 F (36.5 C)  TempSrc: Oral Oral Oral Oral  SpO2: 98% 98% 98% 96%  Weight:      Height:        General: Pt is alert, awake, not in acute distress Cardiovascular: RRR, S1/S2 +, no rubs, no gallops Respiratory: CTA bilaterally, no wheezing, no rhonchi Abdominal: Soft, NT, ND, bowel sounds + Extremities: no edema, no cyanosis    The results of significant diagnostics from this hospitalization (including imaging, microbiology, ancillary and laboratory) are listed below for reference.     Microbiology: Recent Results (from the past 240 hour(s))  SARS Coronavirus 2 by RT PCR (hospital order, performed in [facility] hospital lab) Nasopharyngeal Nasopharyngeal Swab     Status: None   Collection Time: 10/09/19  8:20 PM   Specimen: Nasopharyngeal Swab  Result Value Ref Range Status   SARS Coronavirus 2 NEGATIVE NEGATIVE Final    Comment: (NOTE) SARS-CoV-2 target nucleic acids are NOT DETECTED.  The SARS-CoV-2 RNA is generally detectable in upper and lower respiratory specimens during the acute phase of infection. The lowest concentration of SARS-CoV-2 viral copies this assay can detect is 250 copies / mL. A negative result does not preclude SARS-CoV-2 infection and should not be used as the sole basis for treatment or other patient management decisions.  A negative result may occur with improper specimen collection /  handling, submission of specimen other than nasopharyngeal swab, presence of viral mutation(s) within the areas targeted by this assay, and inadequate number of viral copies (<250 copies / mL). A negative result must be combined with clinical observations, patient history, and epidemiological information.  Fact Sheet for Patients:   StrictlyIdeas.no  Fact Sheet for Healthcare Providers: BankingDealers.co.za  This test is not yet approved or  cleared by the [place] FDA and has been authorized for detection and/or diagnosis of SARS-CoV-2 by FDA under an Emergency Use Authorization (EUA).  This EUA will remain in effect (meaning this test can be used) for the duration of the COVID-19 declaration under Section 564(b)(1) of the Act, 21 U.S.C. section 360bbb-3(b)(1), unless the authorization is terminated or revoked sooner.  Performed at [facility], [place] [address] [place], [place] [address]      Labs: BNP (last 3 results) No results for input(s): BNP in the last 8760 hours. Basic Metabolic Panel: Recent Labs  Lab 10/09/19  1304 10/10/19 0027  NA 139 138  K 4.0 3.6  CL 105 104  CO2 25 23  GLUCOSE 85 189*  BUN 18 17  CREATININE 0.93 1.04  CALCIUM 9.6 8.6*   Liver Function Tests: Recent Labs  Lab 10/09/19 1304 10/10/19 0027  AST 26 24  ALT 41 37  ALKPHOS 73 64  BILITOT 0.6 0.3  PROT 6.8 5.8*  ALBUMIN 4.5 3.7   No results for input(s): LIPASE, AMYLASE in the last 168 hours. No results for input(s): AMMONIA in the last 168 hours. CBC: Recent Labs  Lab 10/09/19 1304 10/10/19 0027  WBC 6.9 7.3  NEUTROABS 3.2  --   HGB 13.6 12.6*  HCT 42.8 39.1  MCV 78.1* 78.5*  PLT 242 209   Cardiac Enzymes: No results for input(s): CKTOTAL, CKMB, CKMBINDEX, TROPONINI in the last 168 hours. BNP: Invalid input(s): POCBNP CBG: Recent Labs  Lab 10/10/19 0409 10/10/19 0743 10/10/19 1132 10/10/19 1616  10/10/19 1940  GLUCAP 102* 101* 94 219* 113*   D-Dimer No results for input(s): DDIMER in the last 72 hours. Hgb A1c Recent Labs    10/09/19 2321  HGBA1C 7.3*   Lipid Profile No results for input(s): CHOL, HDL, LDLCALC, TRIG, CHOLHDL, LDLDIRECT in the last 72 hours. Thyroid function studies No results for input(s): TSH, T4TOTAL, T3FREE, THYROIDAB in the last 72 hours.  Invalid input(s): FREET3 Anemia work up No results for input(s): VITAMINB12, FOLATE, FERRITIN, TIBC, IRON, RETICCTPCT in the last 72 hours. Urinalysis    Component Value Date/Time   COLORURINE YELLOW 10/09/2019 2343   APPEARANCEUR CLEAR 10/09/2019 2343   LABSPEC 1.019 10/09/2019 2343   PHURINE 5.0 10/09/2019 2343   GLUCOSEU NEGATIVE 10/09/2019 2343   HGBUR NEGATIVE 10/09/2019 2343   BILIRUBINUR NEGATIVE 10/09/2019 2343   KETONESUR NEGATIVE 10/09/2019 2343   PROTEINUR NEGATIVE 10/09/2019 2343   UROBILINOGEN 0.2 09/08/2007 1029   NITRITE NEGATIVE 10/09/2019 2343   LEUKOCYTESUR NEGATIVE 10/09/2019 2343   Sepsis Labs Invalid input(s): PROCALCITONIN,  WBC,  LACTICIDVEN Microbiology Recent Results (from the past 240 hour(s))  SARS Coronavirus 2 by RT PCR (hospital order, performed in [place] hospital lab) Nasopharyngeal Nasopharyngeal Swab     Status: None   Collection Time: 10/09/19  8:20 PM   Specimen: Nasopharyngeal Swab  Result Value Ref Range Status   SARS Coronavirus 2 NEGATIVE NEGATIVE Final    Comment: (NOTE) SARS-CoV-2 target nucleic acids are NOT DETECTED.  The SARS-CoV-2 RNA is generally detectable in upper and lower respiratory specimens during the acute phase of infection. The lowest concentration of SARS-CoV-2 viral copies this assay can detect is 250 copies / mL. A negative result does not preclude SARS-CoV-2 infection and should not be used as the sole basis for treatment or other patient management decisions.  A negative result may occur with improper specimen collection / handling,  submission of specimen other than nasopharyngeal swab, presence of viral mutation(s) within the areas targeted by this assay, and inadequate number of viral copies (<250 copies / mL). A negative result must be combined with clinical observations, patient history, and epidemiological information.  Fact Sheet for Patients:   StrictlyIdeas.no  Fact Sheet for Healthcare Providers: BankingDealers.co.za  This test is not yet approved or  cleared by the [place] FDA and has been authorized for detection and/or diagnosis of SARS-CoV-2 by FDA under an Emergency Use Authorization (EUA).  This EUA will remain in effect (meaning this test can be used) for the duration of the COVID-19 declaration under  Section 564(b)(1) of the Act, 21 U.S.C. section 360bbb-3(b)(1), unless the authorization is terminated or revoked sooner.  Performed at [facility], [place] [address], [place], [place] [address]      Time coordinating discharge: Over 30 minutes  SIGNED:   Charlynne Cousins, MD  [facility] 10/11/2019, 7:30 AM Pager   If 7PM-7AM, please contact night-coverage [email or web address] Password [email or web address]

## 2019-10-11 NOTE — Plan of Care (Signed)
  Problem: Education: Goal: Knowledge of General Education information will improve Description: Including pain rating scale, medication(s)/side effects and non-pharmacologic comfort measures Outcome: Adequate for Discharge   Problem: Health Behavior/Discharge Planning: Goal: Ability to manage health-related needs will improve Outcome: Adequate for Discharge   Problem: Clinical Measurements: Goal: Ability to maintain clinical measurements within normal limits will improve Outcome: Adequate for Discharge Goal: Will remain free from infection Outcome: Adequate for Discharge   Problem: Activity: Goal: Risk for activity intolerance will decrease Outcome: Adequate for Discharge   Problem: Nutrition: Goal: Adequate nutrition will be maintained Outcome: Adequate for Discharge   Problem: Coping: Goal: Level of anxiety will decrease Outcome: Adequate for Discharge   Problem: Elimination: Goal: Will not experience complications related to bowel motility Outcome: Adequate for Discharge   Problem: Pain Managment: Goal: General experience of comfort will improve Outcome: Adequate for Discharge   Problem: Safety: Goal: Ability to remain free from injury will improve Outcome: Adequate for Discharge   Problem: Skin Integrity: Goal: Risk for impaired skin integrity will decrease Outcome: Adequate for Discharge

## 2019-10-11 NOTE — Progress Notes (Signed)
Discharge summary packet provided to pt with instructions, Pt verbalized understanding of instructions with no complaints voiced. Per pt his sister would come over to pick him up.

## 2019-10-11 NOTE — Progress Notes (Signed)
Patient is up to chair. His flatus is coming out and patient is feeling better.

## 2019-10-14 ENCOUNTER — Telehealth: Payer: Self-pay

## 2019-10-14 NOTE — Telephone Encounter (Signed)
Transition Care Management Follow-up Telephone Call Date of discharge and from where: 10/11/2019, [facility]   Call placed to patient # [phone], message left with call back requested to this CM. # [phone].  Patient needs to schedule hospital follow up with Dr Wynetta Emery

## 2019-10-15 ENCOUNTER — Telehealth: Payer: Self-pay | Admitting: Internal Medicine

## 2019-10-15 LAB — CULTURE, BLOOD (ROUTINE X 2)
Culture: NO GROWTH
Culture: NO GROWTH
Special Requests: ADEQUATE
Special Requests: ADEQUATE

## 2019-10-15 NOTE — Telephone Encounter (Signed)
Message was routed to the provider!

## 2019-10-15 NOTE — Telephone Encounter (Signed)
Patient's sister calling back about this request.

## 2019-10-15 NOTE — Telephone Encounter (Signed)
Please advise   Copied from [place] [id number]. Topic: General - Other >> Oct 09, 2019  8:33 AM Leward Quan A wrote: Reason for CRM: Patient sister called to say that he have an insect bite of some kind on 10/06/19 they tried OTC cream for the itching and swelling. Now his right arm is swollen from the elbow down and need to know what to do. Please call [name] at Ph# [phone]

## 2019-10-15 NOTE — Telephone Encounter (Signed)
Pt called back and his sister got on the line.  She states they prescribed abx when he left the hospital, (written Rx), doxycycline (VIBRA-TABS) 100 MG tablet  Pt has lost that Rx ad she is requesting this be called into  [facility] for the pt.  Sister states she just got off phone with someone there. If that person could call in medication for the pt please

## 2019-10-16 ENCOUNTER — Other Ambulatory Visit: Payer: Self-pay

## 2019-10-16 MED FILL — DOXYCYCLINE HYCLATE 100 MG: 100 | 7 days supply | Qty: 14 | Fill #0

## 2019-10-16 NOTE — Telephone Encounter (Signed)
Spoke with pt sister / pending prescription in the pharmacy / will pick it up today

## 2019-10-21 ENCOUNTER — Encounter: Payer: Self-pay | Admitting: Pharmacist

## 2019-10-21 ENCOUNTER — Ambulatory Visit: Payer: Medicaid Other | Attending: Internal Medicine | Admitting: Pharmacist

## 2019-10-21 ENCOUNTER — Other Ambulatory Visit: Payer: Self-pay

## 2019-10-21 VITALS — BP 133/91

## 2019-10-21 DIAGNOSIS — I10 Essential (primary) hypertension: Secondary | ICD-10-CM

## 2019-10-21 NOTE — Progress Notes (Signed)
° °  S:   PCP: Dr. Wynetta Emery   Patient arrives in good spirits. Presents to the clinic for hypertension evaluation, counseling, and management.   Patient was referred and last seen by Primary Care Provider on 08/24/2019. I saw him in June and we started lisinopril. Of note, pt admitted 10/09/19-10/11/19 to [facility] for R arm cellulitis. MRI negative for osteomyelitis. Blood cultures were negative. Hand surgery consulted and an I&D performed. Pt was on IV Rocephin and d/c on PO doxycycline. Today he reports feeling better. Will finish doxy in a couple of days. He had a BMP done in hospital with renal function and electrolyte status WNL.    Medication adherence reported. He has not taken lisinopril today.     Current BP Medications include:  Lisinopril 10 mg daily  Dietary habits include: sister limits salt in what she cooks; patient denies drinking caffeine  Exercise habits include: none  Family / Social history:  - FHx: DM - Tobacco: never smoker  - Alcohol: denies use   O:  Vitals:   10/21/19 0937  BP: (!) 133/91   Home BP readings: none   Last 3 Office BP readings: BP Readings from Last 3 Encounters:  10/21/19 (!) 133/91  10/11/19 100/64  10/09/19 (!) 126/92    BMET    Component Value Date/Time   NA 138 10/10/2019 0027   NA 138 04/30/2019 1633   K 3.6 10/10/2019 0027   CL 104 10/10/2019 0027   CO2 23 10/10/2019 0027   GLUCOSE 189 (H) 10/10/2019 0027   BUN 17 10/10/2019 0027   BUN 12 04/30/2019 1633   CREATININE 1.04 10/10/2019 0027   CALCIUM 8.6 (L) 10/10/2019 0027   GFRNONAA >60 10/10/2019 0027   GFRAA >60 10/10/2019 5621    Renal function: Estimated Creatinine Clearance: 81.7 mL/min (by C-G formula based on SCr of 1.04 mg/dL).  Clinical ASCVD: No  The 10-year ASCVD risk score Mikey Bussing DC Jr., et al., 2013) is: 15.6%   Values used to calculate the score:     Age: 24 years     Sex: Male     Is Non-Hispanic African American: No     Diabetic: Yes     Tobacco smoker:  No     Systolic Blood Pressure: 308 mmHg     Is BP treated: Yes     HDL Cholesterol: 31 mg/dL     Total Cholesterol: 226 mg/dL   A/P: Hypertension newly diagnosed currently still above goal but improved from last visit with me. BP Goal = < 130/80 mmHg. Medication adherence reported but he has not taken lisinopril yet this morning. Pt is still not exercising and wishes to increase his exercise before adjusting medication further. Encouraged 150 mins/wk of aerobic exercise.  -Lisinopril 10 mg daily.  -Counseled on lifestyle modifications for blood pressure control including reduced dietary sodium, increased exercise, adequate sleep.  Results reviewed and written information provided.   Total time in face-to-face counseling 15 minutes.   F/U Clinic Visit with PCP.   Benard Halsted, PharmD, [place] [phone]

## 2019-10-30 ENCOUNTER — Other Ambulatory Visit: Payer: Self-pay

## 2019-10-30 ENCOUNTER — Encounter: Payer: Self-pay | Admitting: Counselor

## 2019-10-30 ENCOUNTER — Ambulatory Visit (INDEPENDENT_AMBULATORY_CARE_PROVIDER_SITE_OTHER): Payer: Medicaid Other | Admitting: Counselor

## 2019-10-30 DIAGNOSIS — F02818 Dementia in other diseases classified elsewhere, unspecified severity, with other behavioral disturbance: Secondary | ICD-10-CM

## 2019-10-30 DIAGNOSIS — F0281 Dementia in other diseases classified elsewhere with behavioral disturbance: Secondary | ICD-10-CM | POA: Diagnosis not present

## 2019-10-30 LAB — HM DIABETES EYE EXAM

## 2019-10-30 NOTE — Progress Notes (Signed)
[facility]  Patient Name: [name] MRN: [id number] Date of Birth: 02/11/1971 Age: 49 y.o. Education: 12 years  Referral Circumstances and Background Information  [name] is a 49 y.o., left-hand dominant, single man with a very complex medical history. He was in his normal state of health until the week of Christmas, 2020. He had not been seen much by his family since 12/22 and on 12/26, he seemed confused and was eventually taken to the hospital on 12/27. He was brought to [facility] where he was found to have renal failure, with unclear etiology (ATN due to rhabdomyolysis was suspected). He was admitted, was uremic, and was felt to have acute metabolic encephalopathy. An MRI with contrast (04/07/2019) showed abnormal T2/FLAIR signal in the medial temporal lobe and posteromedial left thalamus with mild parenchymal swelling and chronic blood productions along the right parietal lobe. 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 He had a seizure on 01/01 and an abnormal 18 hour EEG, with sharply contoured 2-3 Hz delta activity in the right temporoparietal region. He had several additional seizures during that hospital stay and a repeat MRI on 04/15/2019 showed generalized subcortical greater than cortical signal abnormalities in the frontoparietal lobes, occipital lobes, posterior temporal lobes, and cerebellum and patchy areas of cortical restricted diffusion in these areas concerning for acute/early subacute infarcts. There was concern for PRES vs. seizure related changes vs. Encephalitis (he was hypertensive on admission and his blood pressures were as high as 210/105). He was eventually discharged from the hospital after undergoing dialysis and being medically stabilized but returned again on 04/21/2019 when he was found to have GIB due to peptic ulcer  disease. He has since been discharged to home and is doing better physically, but he remains cognitively impaired. He has been following with Dr. Delice Lesch who referred him for neuropsychological testing.   On interview, the patient appeared drowsy, somewhat sullen, and was not very communicative. He reported that he is aware he has problems with memory and thinking but didn't offer much else. His sister [name] provided collateral history and status information and stated that he has very significant memory problems and does not retain information from day-to-day. She had to remind him several times about the appointment and he still did not recall when she got him up this morning. He also has problems with orientation and loses the day and the date. He is forgetting basic things such as if he has had a meal. He spends a fair amount of time "staring into space" and being inattentive. He is quite at home and doesn't say much (but he has never been a talker). They denied any issues with language including comprehension, word finding, or with making speech sounds. They weren't sure about judgement and problem solving although he was afraid for months that his 5lb pomeranian would attack him, suggesting there are problems. He is quite dependent on his sister and wants her to help him with things. He reported that he is not sad or depressed, but he does have low energy, the Keppra makes him tired. He has been out since 8:00am this morning and is tired today. He says that his sleep is sporadic and wasn't able to report how much he is sleeping in detail.   With respect to functioning, the patient is spending about 18 hours a day in bed because "there is nothing to  do." His family have essentially taken over all of his instrumental activities of daily living including managing finances, meal preparation, and he is not driving. They provide him with extensive assistance and he requires prompting to keep a schedule, make it to  appointments and the like. He was previously employed as a Clinical research associate and needless to say, he has not been able to return to work. With respect to basic self-care, he is independent, although his sister has to prompt him to engage in hygiene practices. He is never alone in the community although he will accompany his sister out into the community at times, such as to get prescriptions or the like. They don't feel safe letting him out of the house much given his current mental status.   Past Medical History and Review of Relevant Studies   Patient Active Problem List   Diagnosis Date Noted  . Cellulitis of right upper extremity 10/10/2019  . Cellulitis of right wrist 10/09/2019  . Elevated blood pressure reading 08/24/2019  . Memory changes 05/25/2019  . Non-traumatic rhabdomyolysis   . Acute GI bleeding 04/21/2019  . Diabetes mellitus type 2 in nonobese ([place]) 04/21/2019  . Seizures ([place]) 04/19/2019  . Acute blood loss anemia   . Duodenal ulcer hemorrhage   . Acute gastric ulcer without hemorrhage or perforation   . Acute renal failure (ARF) ([place]) 04/05/2019  . Acute metabolic encephalopathy [id number]/8768   Review of Neuroimaging and Relevant Medical History: The patient's most recent MRI from 09/13/2019 shows numerous tiny areas of abnormal T2/FLAIR high signal along the cerebral convexities (cortically based) that correlated with areas of restricted diffusion on his previous MRI from 04/15/2019 as per radiology, likely representing progression to infarction. Apart from that his brain volume is borderline for his age with a bit of generalized volume loss, maybe a bit more in the posterior areas. There may be some subtle volume loss comparing his most recent MRI to the studies on 04/15/2019 and 04/07/2019 although it is not prominent. The majority of major abnormalities on those studies appear to have resolved.   His MRI from 04/15/2019 shows extensive confluent areas of T2/FLAIR signal  abnormality in the bipartietal, occipital, and frontal areas as well as the posterior temporal lobes. Patchy restricted diffusion was also noted within these areas. Additional findings as noted in radiology report below.   "IMPRESSION: New from prior MRI 04/07/2019, there is fairly extensive subcortical greater than cortical signal abnormality within the bilateral frontoparietal lobes, occipital lobes and posterior temporal lobes. Patchy cortical restricted diffusion in these territories suspicious for acute/early subacute infarcts. There is also new signal abnormality within the bilateral cerebellum as well as patchy restricted diffusion which is suspicious for acute/early subacute infarcts. 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  Diffusion-weighted and T2 hyperintense signal abnormality within the medial left temporal lobe and posteromedial left thalamus persists, but is somewhat less conspicuous. Differential considerations are unchanged and may reflect seizure related changes, HSV encephalitis or autoimmune encephalitis. Continued MRI follow-up is recommended to ensure resolution of signal abnormality at these sites.  Electronically Signed   By: Kellie Simmering DO   On: 04/15/2019 13:33"  Current Outpatient Medications  Medication Sig Dispense Refill  . acetaminophen (TYLENOL) 500 MG tablet Take 1 tablet (500 mg total) by mouth every 6 (six) hours as needed for mild pain, moderate pain, fever or headache.    Marland Kitchen atorvastatin (LIPITOR) 10 MG tablet Take 1  tablet (10 mg total) by mouth daily. 90 tablet 3  . doxycycline (VIBRA-TABS) 100 MG tablet Take 1 tablet (100 mg total) by mouth every 12 (twelve) hours. 14 tablet 0  . fexofenadine (ALLEGRA) 180 MG tablet Take 180 mg by mouth daily as needed for allergies or rhinitis.    Marland Kitchen glimepiride (AMARYL) 2 MG tablet Take 1 tablet (2 mg  total) by mouth daily before breakfast. 90 tablet 3  . levETIRAcetam (KEPPRA) 500 MG tablet Take 1 tablet (500 mg total) by mouth 2 (two) times daily. 180 tablet 5  . lisinopril (ZESTRIL) 10 MG tablet Take 1 tablet (10 mg total) by mouth daily. 90 tablet 0  . pantoprazole (PROTONIX) 40 MG tablet Take 1 tablet (40 mg total) by mouth 2 (two) times daily. (Patient taking differently: Take 40 mg by mouth daily. ) 60 tablet 6   No current facility-administered medications for this visit.   Family History  Problem Relation Age of Onset  . Diabetes Mother   . Diabetes Sister   . Diabetes Father   . Esophageal cancer Neg Hx   . Stomach cancer Neg Hx   . Colon cancer Neg Hx   . Pancreatic cancer Neg Hx    There is no  family history of dementia. There is no  family history of psychiatric illness.  Psychosocial History  Developmental, Educational and Employment History: [name] is a native of [place]. They reported that he did very well until he got to high school and was moved to an unsupervised learning experience of some sort and he did not do well after that. He started "hanging with the wrong crowd" and was frequently truant and using a fair amount of recreational drugs. He apparently got a full scholarship to go to [employer] for wood work but he didn't wish to go. 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 His sister reported that he is not a very social person and enjoys solitary work.   Psychiatric History: The patient has no formal psychiatric history. It sounds like he has had a hard time in life. After his father died, in his 63s, he left home for two years and "turned against" the family and it sounds like things were downhill from there. He likely has an undiagnosed difficulty of some sort, it sounds like he has poor social skills, has always been somewhat reclusive.  His sister described him as "very aggressive and aggravated" with them for about a year prior to his recent incident. She isn't sure why, he didn't want to talk about it.    Substance Use History: The patient was using cannabis frequently, on a daily basis prior to his recent events. They denied any other significant drug use.   Relationship History and Living Cimcumstances: The patient lives with his sister, who supports him and his mother. He has never been married and has not been in many long term relationships. He has no children.   Mental Status and Behavioral Observations  Sensorium/Arousal: The patient's level of arousal was awake and alert. Hearing and vision were adequate for testing purposes.  Orientation: [name] was oriented to person and place only; he was not oriented to date. He was unsure of the current president and reported the past president as "bush." Appearance: Appeared mildly disheveled, in sweat clothing Behavior: The patient was minimally interactive, made  no eye contact, and let his sister answer for him.  Speech/language: He was soft spoken. Speech was slow in rate, with normal rhythm, volume, and prosody.  Gait/Posture: Not well observed, appeared somewhat clumsy.  Movement: He was somewhat hypokinetic and bradykinetic, perhaps on the basis of fatigue.  Social Comportment: Minimally interactive, no eye contact Mood: "Ok" Affect: Affect was flat Thought process/content: Thought process could not be well assessed given [name] fatigue and tendency to not interact.  Safety: [name] denied any thoughts of harming himself or others on direct questioning.  Insight: Unclear  Plan  [name] was seen for a psychiatric diagnostic evaluation and neuropsychological testing. I completed the psychiatric diagnostic interview and extensive chart review of notes from the referring provider, his hospital course, and reviewed his brain imaging studies. After discussing  risks and benefits with him and his sister, we decided to forego testing today. He was extremely fatigued, falling asleep at times during the encounter. Apparently he has been out since 8:00am going to appointments and his Keppra also makes him sleepy when he takes it in the afternoon. He will return on Friday 11/06/2019 at 2:00pm to complete testing. He also recently was seen at the [facility] [place] where he completed an RBANS (Form A) and WAIS-IV so testing will need to be modified to avoid validity issues. Full and complete note with impressions, recommendations, and interpretation of test data to follow.   Viviano Simas Nicole Kindred, PsyD, [place] Clinical Neuropsychologist  Informed Consent and Coding/Compliance  Risks and benefits of the evaluation were discussed with the patient prior to all testing procedures. I conducted a clinical interview with [name]. Billing below reflects technician time, my direct face-to-face time with the patient, time spent in test administration, and time spent in professional activities including but not limited to: neuropsychological test interpretation, integration of neuropsychological test data with clinical history, report preparation, treatment planning, care coordination, and review of diagnostically pertinent medical history or studies.   Services associated with this encounter: Clinical Interview [phone])

## 2019-11-04 ENCOUNTER — Encounter: Payer: Self-pay | Admitting: Internal Medicine

## 2019-11-10 ENCOUNTER — Encounter: Payer: Self-pay | Admitting: Counselor

## 2019-11-10 ENCOUNTER — Other Ambulatory Visit: Payer: Self-pay

## 2019-11-10 ENCOUNTER — Ambulatory Visit: Payer: Medicaid Other | Admitting: Psychology

## 2019-11-10 ENCOUNTER — Ambulatory Visit (INDEPENDENT_AMBULATORY_CARE_PROVIDER_SITE_OTHER): Payer: Medicaid Other | Admitting: Counselor

## 2019-11-10 DIAGNOSIS — R453 Demoralization and apathy: Secondary | ICD-10-CM

## 2019-11-10 DIAGNOSIS — F0280 Dementia in other diseases classified elsewhere without behavioral disturbance: Secondary | ICD-10-CM | POA: Diagnosis not present

## 2019-11-10 DIAGNOSIS — F09 Unspecified mental disorder due to known physiological condition: Secondary | ICD-10-CM

## 2019-11-10 DIAGNOSIS — R4182 Altered mental status, unspecified: Secondary | ICD-10-CM | POA: Diagnosis not present

## 2019-11-10 DIAGNOSIS — F4321 Adjustment disorder with depressed mood: Secondary | ICD-10-CM

## 2019-11-10 DIAGNOSIS — I639 Cerebral infarction, unspecified: Secondary | ICD-10-CM

## 2019-11-10 NOTE — Progress Notes (Signed)
   Psychometrist Note   Cognitive testing was administered to [employer] by Milana Kidney, B.S. (Technician) under the supervision of Alphonzo Severance, Psy.D., ABN. Mr. Nephew was able to tolerate all test procedures. Dr. Nicole Kindred met with the patient as needed to manage any emotional reactions to the testing procedures. Rest breaks were offered.    The battery of tests administered was selected by Dr. Nicole Kindred with consideration to the patient's current level of functioning, the nature of his symptoms, emotional and behavioral responses during the interview, level of literacy, observed level of motivation/effort, and the nature of the referral question. This battery was communicated to the psychometrist. Communication between Dr. Nicole Kindred and the psychometrist was ongoing throughout the evaluation and Dr. Nicole Kindred was immediately accessible at all times. Dr. Nicole Kindred provided supervision to the technician on the date of this service, to the extent necessary to assure the quality of all services provided.    Mr. Brandenberger will return in approximately one week for an interactive feedback session with Dr. Nicole Kindred, at which time male test performance, clinical impressions, and treatment recommendations will be reviewed in detail. The patient understands he can contact our office should he require our assistance before this time.   A total of 80 minutes of billable time were spent with [name] by the technician, including test administration and scoring time. Billing for these services is reflected in Dr. Les Pou note.   This note reflects time spent with the psychometrician and does not include test scores, clinical history, or any interpretations made by Dr. Nicole Kindred. The full report will follow in a separate note.

## 2019-11-10 NOTE — Progress Notes (Signed)
[facility]  Patient Name: [name] MRN: [id number] Date of Birth: [birth year] Age: 49 y.o. Education: 12 years  Current Visit  [name] is a 49 year old, left-hand dominant, single man who was in his normal state of health until the week of Christmas, 2020 when he presented to [facility] with AMS, was found to have Rhabdo, renal failure, he was uremic, hypertensive, and he had a seizure during the course of his hospitalization and developed neuroimaging findings concerning for PRES vs. Seizure related changes vs. Encephalitis (multiple areas of T2/FLAIR signal abnormalities in the temporal lobes, and then eventually in the cortex, with some associated restricted diffusion). He was discharged, went back to the hospital related to GIB, and was then eventually discharged to home. He was initially seen on 10/30/2019 for neuropsychological evaluation and his sister reported at that visit that he has been essentially totally dependent upon her and has few activities at this point other than sleeping. He has not returned to driving, has not been able to return to work, and depends on his family for help with food preparation and prompting to engage in hygiene practices. He rapidly forgets most things and is not going out of the house on his own. He was near somnolent related to his antizeizure medication and fatigue from a long day during the initial encounter and we deferred testing at that time. He returns today to complete testing with Milana Kidney and I also conducted a neurobehavioral status examination with him. Full and complete report with neuropsychological test data and interpretation to follow. A copy of the neuropsychological evaluation note from his prior appointment is below for reference.   Neurobehavioral Status Examination  Arousal:Neurobehavioral status examination found [name] to be awake and much more alert than at the previous  evaluation. He stated that he was feeling well and was ready for testing.  Orientation: He was fully oriented to person, place, and situation but was not oriented to time (reported it was March 14th, 2020). He was also off on the time by 2 hours.  Attention: His attention for repeating a name and address was intact as it took him two tries to acquire "Janice Norrie, [place]." He was able to correctly count backwards from 20 to 1 and did make some errors but self-corrected. He correctly completed 4/6 serial subtractions of 3 from 20, which is mildly impaired.  Memory/Delayed Recall: His delayed recall for the name and address was 4/6, which is weak.  Mood/Affect: [name] reported that he felt "fine, just tired, and my body hurts" and his affect was mainly neutral, which is much brighter than last time. Thought Process/Content: Thought process was logical, linear, and goal-oriented without any loosening of associations or other signs to suggest a psychotic process. His thought content was appropriate.  Safety: [name] denied any thoughts of harming himself or others when asked directly   Plan  Testing and neurobehavioral status examination (including additional chart review and integration of clinical data) completed. Full and complete note with impressions, recommendations, and interpretation of test data to follow.   Viviano Simas Nicole Kindred, PsyD, [place] Clinical Neuropsychologist  Informed Consent and Coding/Compliance  Risks and benefits of the evaluation were discussed with the patient prior to all testing procedures. I conducted a neurobehavioral status examination with [name] and Milana Kidney, B.S. (Technician) assisted me in administering additional test procedures. The patient was able to tolerate the testing procedures and the patient (and/or family if  applicable) is likely to benefit from further follow up to receive the diagnosis and treatment recommendations, which will  be rendered at the next encounter. Billing below reflects technician time, my direct face-to-face time with the patient, time spent in test administration, and time spent in professional activities including but not limited to: neuropsychological test interpretation, integration of neuropsychological test data with clinical history, report preparation, treatment planning, care coordination, and review of diagnostically pertinent medical history or studies.   Services associated with this encounter: 34 minutes Neurobehavioral Status Examination [phone]) plus 60 minutes (26948; Neuropsychological Evaluation by Professional)  95 minutes (54627; Neuropsychological Evaluation by Professional, Adl.) 30 minutes (03500; Neuropsychological Testing by Technician) 50 minutes (93818; Neuropsychological Testing by Technician, Adl.)  Initial Interview Note Initial Diagnostic Interview Note  Mr. [name] is a 49 y.o., left-hand dominant, single man with a very complex medical history. He was in his normal state of health until the week of Christmas, 2020. He had not been seen much by his family since 12/22 and on 12/26, he seemed confused and was eventually taken to the hospital on 12/27. He was brought to [facility] where he was found to have renal failure, with unclear etiology (ATN due to rhabdomyolysis was suspected). He was admitted, was uremic, and was felt to have acute metabolic encephalopathy. An MRI with contrast (04/07/2019) showed abnormal T2/FLAIR signal in the medial temporal lobe and posteromedial left thalamus with mild parenchymal swelling and chronic blood productions along the right parietal lobe. 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 He had a seizure on 01/01 and an abnormal 18 hour EEG, with sharply contoured 2-3 Hz delta activity in the right temporoparietal region. He had several additional  seizures during that hospital stay and a repeat MRI on 04/15/2019 showed generalized subcortical greater than cortical signal abnormalities in the frontoparietal lobes, occipital lobes, posterior temporal lobes, and cerebellum and patchy areas of cortical restricted diffusion in these areas concerning for acute/early subacute infarcts. There was concern for PRES vs. seizure related changes vs. Encephalitis (he was hypertensive on admission and his blood pressures were as high as 210/105). He was eventually discharged from the hospital after undergoing dialysis and being medically stabilized but returned again on 04/21/2019 when he was found to have GIB due to peptic ulcer disease. He has since been discharged to home and is doing better physically, but he remains cognitively impaired. He has been following with Dr. Delice Lesch who referred him for neuropsychological testing.   On interview, the patient appeared drowsy, somewhat sullen, and was not very communicative. He reported that he is aware he has problems with memory and thinking but didn't offer much else. His sister [name] provided collateral history and status information and stated that he has very significant memory problems and does not retain information from day-to-day. She had to remind him several times about the appointment and he still did not recall when she got him up this morning. He also has problems with orientation and loses the day and the date. He is forgetting basic things such as if he has had a meal. He spends a fair amount of time "staring into space" and being inattentive. He is quite at home and doesn't say much (but he has never been a talker). They denied any issues with language including comprehension, word finding, or with making speech sounds. They weren't sure about judgement and problem solving although he was  afraid for months that his 5lb pomeranian would attack him, suggesting there are problems. He is quite dependent on his  sister and wants her to help him with things. He reported that he is not sad or depressed, but he does have low energy, the Keppra makes him tired. He has been out since 8:00am this morning and is tired today. He says that his sleep is sporadic and wasn't able to report how much he is sleeping in detail.   With respect to functioning, the patient is spending about 18 hours a day in bed because "there is nothing to do." His family have essentially taken over all of his instrumental activities of daily living including managing finances, meal preparation, and he is not driving. They provide him with extensive assistance and he requires prompting to keep a schedule, make it to appointments and the like. He was previously employed as a Clinical research associate and needless to say, he has not been able to return to work. With respect to basic self-care, he is independent, although his sister has to prompt him to engage in hygiene practices. He is never alone in the community although he will accompany his sister out into the community at times, such as to get prescriptions or the like. They don't feel safe letting him out of the house much given his current mental status.   Past Medical History and Review of Relevant Studies   Patient Active Problem List   Diagnosis Date Noted  . Cellulitis of right upper extremity 10/10/2019  . Cellulitis of right wrist 10/09/2019  . Elevated blood pressure reading 08/24/2019  . Memory changes 05/25/2019  . Non-traumatic rhabdomyolysis   . Acute GI bleeding 04/21/2019  . Diabetes mellitus type 2 in nonobese ([place]) 04/21/2019  . Seizures ([place]) 04/19/2019  . Acute blood loss anemia   . Duodenal ulcer hemorrhage   . Acute gastric ulcer without hemorrhage or perforation   . Acute renal failure (ARF) ([place]) 04/05/2019  . Acute metabolic encephalopathy 41/63/[id number]   Review of Neuroimaging and Relevant Medical History: The patient's most recent MRI from 09/13/2019 shows  numerous tiny areas of abnormal T2/FLAIR high signal along the cerebral convexities (cortically based) that correlated with areas of restricted diffusion on his previous MRI from 04/15/2019 as per radiology, likely representing progression to infarction. Apart from that his brain volume is borderline for his age with a bit of generalized volume loss, maybe a bit more in the posterior areas. There may be some subtle volume loss comparing his most recent MRI to the studies on 04/15/2019 and 04/07/2019 although it is not prominent. The majority of major abnormalities on those studies appear to have resolved.   His MRI from 04/15/2019 shows extensive confluent areas of T2/FLAIR signal abnormality in the bipartietal, occipital, and frontal areas as well as the posterior temporal lobes. Patchy restricted diffusion was also noted within these areas. Additional findings as noted in radiology report below.   "IMPRESSION: New from prior MRI 04/07/2019, there is fairly extensive subcortical greater than cortical signal abnormality within the bilateral frontoparietal lobes, occipital lobes and posterior temporal lobes. Patchy cortical restricted diffusion in these territories suspicious for acute/early subacute infarcts. There is also new signal abnormality within the bilateral cerebellum as well as patchy restricted diffusion which is suspicious for acute/early subacute infarcts. 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  Diffusion-weighted and T2 hyperintense signal abnormality within the medial left temporal lobe and  posteromedial left thalamus persists, but is somewhat less conspicuous. Differential considerations are unchanged and may reflect seizure related changes, HSV encephalitis or autoimmune encephalitis. Continued MRI follow-up is recommended to ensure resolution of signal abnormality at these  sites.  Electronically Signed By: Kellie Simmering DO On: 04/15/2019 13:33"  Current Outpatient Medications  Medication Sig Dispense Refill  . acetaminophen (TYLENOL) 500 MG tablet Take 1 tablet (500 mg total) by mouth every 6 (six) hours as needed for mild pain, moderate pain, fever or headache.    Marland Kitchen atorvastatin (LIPITOR) 10 MG tablet Take 1 tablet (10 mg total) by mouth daily. 90 tablet 3  . doxycycline (VIBRA-TABS) 100 MG tablet Take 1 tablet (100 mg total) by mouth every 12 (twelve) hours. 14 tablet 0  . fexofenadine (ALLEGRA) 180 MG tablet Take 180 mg by mouth daily as needed for allergies or rhinitis.    Marland Kitchen glimepiride (AMARYL) 2 MG tablet Take 1 tablet (2 mg total) by mouth daily before breakfast. 90 tablet 3  . levETIRAcetam (KEPPRA) 500 MG tablet Take 1 tablet (500 mg total) by mouth 2 (two) times daily. 180 tablet 5  . lisinopril (ZESTRIL) 10 MG tablet Take 1 tablet (10 mg total) by mouth daily. 90 tablet 0  . pantoprazole (PROTONIX) 40 MG tablet Take 1 tablet (40 mg total) by mouth 2 (two) times daily. (Patient taking differently: Take 40 mg by mouth daily. ) 60 tablet 6   No current facility-administered medications for this visit.        Family History  Problem Relation Age of Onset  . Diabetes Mother   . Diabetes Sister   . Diabetes Father   . Esophageal cancer Neg Hx   . Stomach cancer Neg Hx   . Colon cancer Neg Hx   . Pancreatic cancer Neg Hx    There is no  family history of dementia. There is no  family history of psychiatric illness.  Psychosocial History  Developmental, Educational and Employment History: Mr. Joines is a native of [place]. They reported that he did very well until he got to high school and was moved to an unsupervised learning experience of some sort and he did not do well after that. He started "hanging with the wrong crowd" and was frequently truant and using a fair amount of recreational drugs. He apparently got a full  scholarship to go to [employer] for wood work but he didn't wish to go. 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 His sister reported that he is not a very social person and enjoys solitary work.   Psychiatric History: The patient has no formal psychiatric history. It sounds like he has had a hard time in life. After his father died, in his 25s, he left home for two years and "turned against" the family and it sounds like things were downhill from there. He likely has an undiagnosed difficulty of some sort, it sounds like he has poor social skills, has always been somewhat reclusive. His sister described him as "very aggressive and aggravated" with them for about a year prior to his recent incident. She isn't sure why, he didn't want to talk about it.    Substance Use History: The patient was using cannabis frequently, on a daily basis prior to his recent events. They denied any other significant drug use.   Relationship History and Living  Cimcumstances: The patient lives with his sister, who supports him and his mother. He has never been married and has not been in many long term relationships. He has no children.

## 2019-11-11 NOTE — Progress Notes (Signed)
     [facility]  Patient Name: Kashmere Staffa MRN: [id number] Date of Birth: 1971/04/05 Age: 49 y.o. Education: 12 years  Measurement properties of test scores: IQ, Index, and Standard Scores (SS): Mean = 100; Standard Deviation = 15 Scaled Scores (Ss): Mean = 10; Standard Deviation = 3 Z scores (Z): Mean = 0; Standard Deviation = 1 T scores (T); Mean = 50; Standard Deviation = 10  TEST SCORES:    Note: This summary of test scores accompanies the interpretive report and should not be interpreted by unqualified individuals or in isolation without reference to the report. Test scores are relative to age, gender, and educational history as available and appropriate.   Performance Validity        The Dot Counting Test: Raw Descriptor      E-Score 14 Within Expectation  "A" Random Letter Test Errors 0 Within Expectation      Embedded Measures: Raw Descriptor      RBANS Effort Index: 9 Below Expectation      Expected Functioning        Wide Range Achievement Test (Word Reading): Standard/Scaled Score Percentile       Word Reading 79 8      Cognitive Testing        RBANS, Form : Standard/Scaled Score Percentile  Total Score --- ---  Immediate Memory 53 <1      List Learning 2 <1      Story Memory 3 1  Visuospatial/Constructional --- ---      Figure Copy   (18) 10 50      Judgment of Line Orientation   (15) --- 26-50  Language --- ---      Picture Naming --- ---      Semantic Fluency 7 16  Attention 53 <1      Digit Span 2 <1      Coding 4 2  Delayed Memory 44 <1      List Recall   (0) --- <2      List Recognition   (13) --- <2      Story Recall   (1) 1 <1      Figure Recall   (3) 2 <1      Verbal Fluency: T-score Percentile      Controlled Oral Word Association (F-A-S) 42 21      Semantic Fluency (Animals) 37 9      Trail Making Test: T-Score Percentile      Part A 43 25      Part B 38 12      Modified [place] Card Sorting  Test (MWCST): Standard/T-Score Percentile      Number of Categories Correct 36 8      Number of Perseverative Errors 28 2      Number of Total Errors 37 9      Percent Perseverative Errors 36 8  Executive Function Composite 70 2      [place] Diagnostic Aphasia Exam: Raw Score Scaled Score      Complex Ideational Material 9 5      Clock Drawing Raw Score Descriptor      Command 8 Borderline Impairment      Rating Scales         Raw Score Descriptor  Geriatric Depression Scale - Short Form 12 Positive   Graceann Boileau V. Nicole Kindred PsyD, [place] Clinical Neuropsychologist

## 2019-11-13 ENCOUNTER — Encounter: Payer: Medicaid Other | Admitting: Counselor

## 2019-11-17 NOTE — Progress Notes (Signed)
[facility]  Patient Name: [name] MRN: [id number] Date of Birth: 1970/06/20 Age: 49 y.o. 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 He presented to [facility] and was found to have rhabdomyolysis, renal failure, and a number of other issues, which were eventually corrected. He was discharged, but then returned to the hospital several weeks later with a GIB due to peptic ulcer disease. Since being discharged to home, he has been significantly impaired, and is dependent upon Dalton sister for help with all instrumental activities of daily living. He is sleeping most of the day and complains of excessive fatigue from Dalton seizure medications. He had completed neuropsychological testing just a week prior to Dalton visit with me, and the battery had to be modified as a result to maintain validity due to concerns about practice effects.   On neuropsychological testing, [name] demonstrated unusually low word reading that may reflect a lack of academic enrichment and tempers expectations for test [name] to some extent. Even with that consideration, he demonstrated significant cognitive impairment with extremely low [name] on measures of attention, processing speed, memory, and weak scores on measures of executive functioning. [name] was better on measures of visuospatial/constructional abilities and language function. He screened positive for the presence of depression.   [name] is thus demonstrating cognitive [name] that, in the context of Dalton clinical history, is suggestive of a major  neurocognitive disorder. The findings are supportive of disability status and suggest relatively significant cognitive compromise, particularly with respect to memory, as might be expected given significant involvement of the hippocampus on imaging. [name] major neurocognitive disorder is likely due to multiple etiologies, including sequelae from the imaging changes noted above due to an unknown etiology. Although he only shows small areas of progression to infarction on Dalton most recent imaging, there may be additional damage/dysfunction that is not well visualized on MRI. He also had a prolonged episode of altered mental status and uremia. Medication side effects and depression may also be contributory. I defer to [name] medical care providers regarding the likely etiology of the abnormalities noted above, which includes encephalitic and vasospastic causes among other etiologies.   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 It sounds as though your level of difficulties with memory and other cognitive skills is significant enough that it undermines your ability to be independent day-to-day, which makes the best diagnosis major neurocgnitive disorder.   Major neurocognitive disorder is a general term for cognitive difficulties (I.e., memory and thinking problems) that are significant enough so as to compromise your ability to function adequately in major daily activities such as managing money, driving, judgment and problem solving, and the like. I think that your major neurocognitive disorder is due to your episode of altered mental status, although it remains unclear what the specific cause of that episode was.   Your  MRI during your hospitalization shows findings that were suggestive of encephalitis (most often caused by viral  or bacterial infections), posterior reversible encephalopathy syndrome (a relatively rare condition involving changes in blood flow in the brain that are often reversible), or some combination thereof, although those are descriptive terms and not specific causes. You also had kidney failure, presumably due to rhabdomyolysis (I.e., damage to muscles that puts strain on the kidneys), which resulted in metabolic abnormalities, which can also compromise cognition if only temporarily. It appears that some of the areas of change on your MRI did progress to stroke but they are small. I think there may be additional damage/dysfunction that is not well visualized on conventional MRI, because the level of problem you are having is greater than expected if the small areas where you had strokes are the only consequences of the changes mentioned above.  Medication side effects and depression may also be contributory.   In terms of your ability to return to work, I think the test findings are supportive of disability status. The focus of treatment at this time should be on continued rehabilitation.   The following recommendations may be helpful to your sister and others in caring for you: . Provide assistance as needed with memory-dependent or other cognitively challenging activities (e.g., doctors appointments, medications, planning trips, complex chores, paperwork or other forms).  . Use simple, clear words that are easy to understand. Simplify information, be clear and concise.  Marland Kitchen Help simplify steps of activities in daily life, such as writing down steps of a task. Eliminate tasks that are difficult and frustrating.  . Give instructions one step at a time.  . Consistency, praise, and encouragement are very important; do not use criticism or punishment.   I suggest that you begin to establish a healthy  daily routine that involves more activity. My concern is that it is suboptimal both for your physical and mental health to be spending as much time in bed as you are currently. This can include assigned chores (at an appropriate level of difficulty), enjoyable cognitively stimulating activities, and other tasks of daily living including meals, showering, and the like. Individuals with significant cognitive difficulties often function better when the day is predictable and there is a clear routine and expectations for behavior. It can be difficult for them to implement such a routine themselves, but once the routine is acquired, it is usually easier to maintain.   You screened positive for the presence of depression. Although everyone deals with difficult life events in a different way, depression is a common reaction to such changes in ones life and can benefit from treatment. Some of your daytime fatigue and lack of activity may also be related to lack of interest and depression. I do not think at this point that psychotherapy is likely to be beneficial and therefore, I would suggest that Dr. Delice Lesch or one of your other prescribing providers consider starting a medication for mood.   You complained of excessive fatigue related to your antiseizure medications. Dr. Delice Lesch is in the best position to advise you of treatment options for your seizure condition that maximize control while minimizing side effects.   Sometimes, for fatigue that is very debilitating, a trial of medication for energy and focus (such as modafinil) is reasonable. Once again, Dr. Delice Lesch is in the best position to advise you about whether that is an option in your [name]. There can also be contraindications to using such medicine in individuals with seizure disorders, which will need to be considered.   Cognitive therapy with a speech-language pathologist might be  beneficial, we can discuss a referral at follow up.   Test Findings  Test  scores are summarized in additional documentation associated with this encounter. Test scores are relative to age, gender, and educational history as available and appropriate. There were no major concerns about [name] validity although several embedded measures did fall well below the level of expectations. My sense is that in [name], this simply reflects very significant cognitive impairment.   General Intellectual Functioning/Achievement:  [name] on single word reading was unusually low, which may be due to limited academic enrichment given [name] reported involvement in school. This tempers findings for [name] to some extent.   Attention and Processing Efficiency: [name] on measures of attention and processing efficiency was low. Digit repetition forward was extremely low and timed number symbol coding was also extremely low. He did somewhat better on simple numeric sequencing, which was low average, although that is a very easy task.   Language: Language [name], while still a bit low in some areas, was a domain of relative strength for this patient. [name] was normal on visual object confrontation naming. Generation of words in response to given letters was low average and generation of words in response to the category prompt "animals" was also low average.   Visuospatial Function: [name] was good on measures of visuospatial and constructional functioning with average scores on copy of a modestly complex figure and on judgment of angular line orientations.   Learning and Memory: [name] on measures of learning and memory was extremely low in all areas. The profile suggests significant memory storage problems.   In the verbal realm, immediate recall for material including a 10-item word list and short story was extremely low on initial learning trials. Following a standard delay, he remembered essentially no information and Dalton  [name] when recalling the words from the word list was not benefitted significantly by recognition cuing.   In the visual realm, delayed recall for a modestly complex figure stimulus was extremely low.   Executive Functions: [name] generated mixed findings within this domain with an overall impression of some impairment. The Executive Function Composite of the Modified [employer] was unusually low, with an unusually low categories completed score and an extremely low perseverative errors score. Reasoning with complex verbal information was unusually low. By contrast, he did a bit better on the very challenging Trail Making Test B, requiring alternating sequencing of numbers and letters of the alphabet. Generation of words in response to given letters was low average. Clock drawing was "borderline," with stimulus bound placement of the hands at 10:50.   Rating Scale(s): [name] screened positive for the presence of depression.   Viviano Simas Nicole Kindred PsyD, [place] Clinical Neuropsychologist

## 2019-11-20 ENCOUNTER — Other Ambulatory Visit: Payer: Self-pay

## 2019-11-20 ENCOUNTER — Ambulatory Visit (INDEPENDENT_AMBULATORY_CARE_PROVIDER_SITE_OTHER): Payer: Medicaid Other | Admitting: Counselor

## 2019-11-20 ENCOUNTER — Encounter: Payer: Self-pay | Admitting: Counselor

## 2019-11-20 DIAGNOSIS — F329 Major depressive disorder, single episode, unspecified: Secondary | ICD-10-CM | POA: Diagnosis not present

## 2019-11-20 DIAGNOSIS — R5383 Other fatigue: Secondary | ICD-10-CM

## 2019-11-20 DIAGNOSIS — F0150 Vascular dementia without behavioral disturbance: Secondary | ICD-10-CM | POA: Diagnosis not present

## 2019-11-20 DIAGNOSIS — F0281 Dementia in other diseases classified elsewhere with behavioral disturbance: Secondary | ICD-10-CM | POA: Diagnosis not present

## 2019-11-20 DIAGNOSIS — F02818 Dementia in other diseases classified elsewhere, unspecified severity, with other behavioral disturbance: Secondary | ICD-10-CM

## 2019-11-20 NOTE — Progress Notes (Signed)
[facility]  Telemedicine statement:  I discussed the limitations of neuropsychological care via telemedicine and the availability of in person appointments. The patient expressed understanding and agreed to proceed. The patient was verified with two identifiers.  The visit modality was: telephonic The patient location was: home The provider location was: office  The following individuals participated: Dinnis Rog  Feedback Note: I met with [name] to review the findings resulting from his neuropsychological evaluation. Since the last appointment, he has been about the same. We had a long conversation about [name]'s diagnosis, impression of fairly significant cognitive compromise, and behavioral strategies to ameliorate his issues. I explained that getting him back on track with a healthy routine will likely be a multidisciplinary endeavor and will require time. We also reviewed in detail the events that transpired during his admission, including his imaging findings. Time was spent reviewing the impressions and recommendations that are detailed in the evaluation report. Recommendations and summary are as reflected in the patient instructions. I took time to explain the findings and answer all the patient's questions. I encouraged [name] to contact me should he have any further questions or if further follow up is desired.   Current Medications and Medical History   Current Outpatient Medications  Medication Sig Dispense Refill  . acetaminophen (TYLENOL) 500 MG tablet Take 1 tablet (500 mg total) by mouth every 6 (six) hours as needed for mild pain, moderate pain, fever or headache.    Marland Kitchen atorvastatin (LIPITOR) 10 MG tablet Take 1 tablet (10 mg total) by mouth daily. 90 tablet 3  . doxycycline (VIBRA-TABS) 100 MG tablet Take 1 tablet (100 mg total) by mouth every 12 (twelve) hours. 14 tablet 0  . fexofenadine (ALLEGRA) 180 MG  tablet Take 180 mg by mouth daily as needed for allergies or rhinitis.    Marland Kitchen glimepiride (AMARYL) 2 MG tablet Take 1 tablet (2 mg total) by mouth daily before breakfast. 90 tablet 3  . levETIRAcetam (KEPPRA) 500 MG tablet Take 1 tablet (500 mg total) by mouth 2 (two) times daily. 180 tablet 5  . lisinopril (ZESTRIL) 10 MG tablet Take 1 tablet (10 mg total) by mouth daily. 90 tablet 0  . pantoprazole (PROTONIX) 40 MG tablet Take 1 tablet (40 mg total) by mouth 2 (two) times daily. (Patient taking differently: Take 40 mg by mouth daily. ) 60 tablet 6   No current facility-administered medications for this visit.    Patient Active Problem List   Diagnosis Date Noted  . Cellulitis of right upper extremity 10/10/2019  . Cellulitis of right wrist 10/09/2019  . Elevated blood pressure reading 08/24/2019  . Memory changes 05/25/2019  . Non-traumatic rhabdomyolysis   . Acute GI bleeding 04/21/2019  . Diabetes mellitus type 2 in nonobese ([place]) 04/21/2019  . Seizures ([place]) 04/19/2019  . Acute blood loss anemia   . Duodenal ulcer hemorrhage   . Acute gastric ulcer without hemorrhage or perforation   . Acute renal failure (ARF) ([place]) 04/05/2019  . Acute metabolic encephalopathy 28/36/6294    Mental Status and Behavioral Observations  [name] was available at the prespecified time for this telephonic appointment and was alert and generally oriented (orientation not formally assessed). Speech was normal in rate, rhythm, volume, and prosody. Self-reported mood was "fine" and affect as assessed by vocal quality was blunted. Thought process was difficult to assess given limited verbal output and thought content was also difficult to assess. There  were no safety concerns identified at today's encounter, such as thoughts of harming self or others.   Plan  Feedback provided regarding the patient's neuropsychological evaluation. I coached his family regarding behavioral strategies for increasing  motivation, although I think that substantial progress may be difficult to make without pharmacologic treatment of his depression. [name] was encouraged to contact me if any questions arise or if further follow up is desired.   Viviano Simas Nicole Kindred, PsyD, [employer] Clinical Neuropsychologist  Service(s) Provided at This Encounter: 23 minutes [phone]; Conjoint therapy with patient present)

## 2019-11-20 NOTE — Patient Instructions (Signed)
Your performance and presentation on assessment were consistent with fairly significant cognitive compromise involving numerous areas, most notably memory. It sounds as though your level of difficulties with memory and other cognitive skills is significant enough that it undermines your ability to be independent day-to-day, which makes the best diagnosis major neurocgnitive disorder.   Major neurocognitive disorder is a general term for cognitive difficulties (I.e., memory and thinking problems) that are significant enough so as to compromise your ability to function adequately in major daily activities such as managing money, driving, judgment and problem solving, and the like. I think that your major neurocognitive disorder is due to your episode of altered mental status, although it remains unclear what the specific cause of that episode was.   Your MRI during your hospitalization shows findings that were suggestive of encephalitis (most often caused by viral or bacterial infections), posterior reversible encephalopathy syndrome (a relatively rare condition involving changes in blood flow in the brain that are often reversible), or some combination thereof, although those are descriptive terms and not specific causes. You also had kidney failure, presumably due to rhabdomyolysis (I.e., damage to muscles that puts strain on the kidneys), which resulted in metabolic abnormalities, which can also compromise cognition if only temporarily. It appears that some of the areas of change on your MRI did progress to stroke but they are small. I think there may be additional damage/dysfunction that is not well visualized on conventional MRI, because the level of problem you are having is greater than expected if the small areas where you had strokes are the only consequences of the changes mentioned above. Medication side effects and depression may also be contributory.   In terms of your ability to return to work, I  think the test findings are supportive of disability status. The focus of treatment at this time should be on continued rehabilitation.   The following recommendations may be helpful to your sister and others in caring for you:  Provide assistance as needed with memory-dependent or other cognitively challenging activities (e.g., doctors appointments, medications, planning trips, complex chores, paperwork or other forms).   Use simple, clear words that are easy to understand. Simplify information, be clear and concise.   Help simplify steps of activities in daily life, such as writing down steps of a task. Eliminate tasks that are difficult and frustrating.   Give instructions one step at a time.   Consistency, praise, and encouragement are very important; do not use criticism or punishment.   I suggest that you begin to establish a healthy daily routine that involves more activity. My concern is that it is suboptimal both for your physical and mental health to be spending as much time in bed as you are currently. This can include assigned chores (at an appropriate level of difficulty), enjoyable cognitively stimulating activities, and other tasks of daily living including meals, showering, and the like. Individuals with significant cognitive difficulties often function better when the day is predictable and there is a clear routine and expectations for behavior. It can be difficult for them to implement such a routine themselves, but once the routine is acquired, it is usually easier to maintain.   You screened positive for the presence of depression. Although everyone deals with difficult life events in a different way, depression is a common reaction to such changes in ones life and can benefit from treatment. Some of your daytime fatigue and lack of activity may also be related to lack of interest and  depression. I do not think at this point that psychotherapy is likely to be beneficial and  therefore, I would suggest that Dr. Delice Lesch or one of your other prescribing providers consider starting a medication for mood.   You complained of excessive fatigue related to your antiseizure medications. Dr. Delice Lesch is in the best position to advise you of treatment options for your seizure condition that maximize control while minimizing side effects.   Sometimes, for fatigue that is very debilitating, a trial of medication for energy and focus (such as modafinil) is reasonable. Once again, Dr. Delice Lesch is in the best position to advise you about whether that is an option in your case. There can also be contraindications to using such medicine in individuals with seizure disorders, which will need to be considered.   Cognitive therapy with a speech-language pathologist might be beneficial, we can discuss a referral at follow up.

## 2019-11-24 ENCOUNTER — Other Ambulatory Visit: Payer: Self-pay | Admitting: Internal Medicine

## 2019-11-24 DIAGNOSIS — E785 Hyperlipidemia, unspecified: Secondary | ICD-10-CM

## 2019-11-24 DIAGNOSIS — E119 Type 2 diabetes mellitus without complications: Secondary | ICD-10-CM

## 2019-11-24 DIAGNOSIS — K279 Peptic ulcer, site unspecified, unspecified as acute or chronic, without hemorrhage or perforation: Secondary | ICD-10-CM

## 2019-11-24 DIAGNOSIS — G40909 Epilepsy, unspecified, not intractable, without status epilepticus: Secondary | ICD-10-CM

## 2019-11-24 DIAGNOSIS — I10 Essential (primary) hypertension: Secondary | ICD-10-CM

## 2019-11-24 MED ORDER — ATORVASTATIN CALCIUM 10 MG PO TABS: 10.0000 mg | ORAL_TABLET | Freq: Every day | ORAL | 0 refills | Status: DC

## 2019-11-24 MED ORDER — GLIMEPIRIDE 2 MG PO TABS: 2.0000 mg | ORAL_TABLET | Freq: Every day | ORAL | 0 refills | Status: DC

## 2019-11-24 MED ORDER — LISINOPRIL 10 MG PO TABS: 10.0000 mg | ORAL_TABLET | Freq: Every day | ORAL | 0 refills | Status: DC

## 2019-11-24 MED ORDER — PANTOPRAZOLE SODIUM 40 MG PO TBEC: 40.0000 mg | DELAYED_RELEASE_TABLET | Freq: Two times a day (BID) | ORAL | 0 refills | Status: DC

## 2019-11-24 NOTE — Telephone Encounter (Signed)
Pt's sister called in to request a refill for pt's medications. They would like to have a 90 day supply.   levETIRAcetam (KEPPRA) 500 MG tablet glimepiride (AMARYL) 2 MG tablet  pantoprazole (PROTONIX) 40 MG tablet  lisinopril (ZESTRIL) 10 MG tablet  atorvastatin (LIPITOR) 10 MG tablet    Pharmacy:   [id number] - [place], [place] [address]. Phone:  [phone]  Fax:  [phone]

## 2019-11-24 NOTE — Telephone Encounter (Signed)
Requested medication (s) are due for refill today: yes  Requested medication (s) are on the active medication list: yes  Last refill:  05/25/2019  Future visit scheduled: yes  Notes to clinic: this refill cannot be delegated    Requested Prescriptions  Pending Prescriptions Disp Refills   levETIRAcetam (KEPPRA) 500 MG tablet 180 tablet 5    Sig: Take 1 tablet (500 mg total) by mouth 2 (two) times daily.      Not Delegated - Neurology:  Anticonvulsants Failed - 11/24/2019  3:01 PM      Failed - This refill cannot be delegated      Failed - HGB in normal range and within 360 days    Hemoglobin  Date Value Ref Range Status  10/10/2019 12.6 (L) 13.0 - 17.0 g/dL Final  08/24/2019 13.9 13.0 - 17.7 g/dL Final          Passed - HCT in normal range and within 360 days    HCT  Date Value Ref Range Status  10/10/2019 39.1 39 - 52 % Final   Hematocrit  Date Value Ref Range Status  08/24/2019 43.9 37.5 - 51.0 % Final          Passed - PLT in normal range and within 360 days    Platelets  Date Value Ref Range Status  10/10/2019 209 150 - 400 K/uL Final  08/24/2019 234 150 - 450 x10E3/uL Final          Passed - WBC in normal range and within 360 days    WBC  Date Value Ref Range Status  10/10/2019 7.3 4.0 - 10.5 K/uL Final          Passed - Valid encounter within last 12 months    Recent Outpatient Visits           1 month ago Essential hypertension   [place], Annie Main L, RPH-CPP   2 months ago Elevated blood pressure reading   [place], Annie Main L, RPH-CPP   3 months ago Need for 23-polyvalent pneumococcal polysaccharide vaccine   [place], Annie Main L, RPH-CPP   3 months ago Type 2 diabetes mellitus with diabetic polyneuropathy, without long-term current use of insulin ([place])   [place] Karle Plumber B,  MD   6 months ago Diabetes mellitus type 2 in nonobese [facility])   [place] Ladell Pier, MD       Future Appointments             In 3 weeks Ladell Pier, MD [place]             Signed Prescriptions Disp Refills   glimepiride (AMARYL) 2 MG tablet 90 tablet 0    Sig: Take 1 tablet (2 mg total) by mouth daily before breakfast.      Endocrinology:  Diabetes - Sulfonylureas Passed - 11/24/2019  3:01 PM      Passed - HBA1C is between 0 and 7.9 and within 180 days    HbA1c, POC (controlled diabetic range)  Date Value Ref Range Status  08/24/2019 6.6 0.0 - 7.0 % Final   Hgb A1c MFr Bld  Date Value Ref Range Status  10/09/2019 7.3 (H) 4.8 - 5.6 % Final    Comment:    (NOTE) Pre diabetes:  5.7%-6.4%  Diabetes:              >6.4%  Glycemic control for   <7.0% adults with diabetes           Passed - Valid encounter within last 6 months    Recent Outpatient Visits           1 month ago Essential hypertension   [place], Annie Main L, RPH-CPP   2 months ago Elevated blood pressure reading   [place], Annie Main L, RPH-CPP   3 months ago Need for 23-polyvalent pneumococcal polysaccharide vaccine   [place], Stephen L, RPH-CPP   3 months ago Type 2 diabetes mellitus with diabetic polyneuropathy, without long-term current use of insulin ([place])   [place] Karle Plumber B, MD   6 months ago Diabetes mellitus type 2 in nonobese [facility])   [place], MD       Future Appointments             In 3 weeks Ladell Pier, MD [place]              pantoprazole (PROTONIX) 40 MG tablet 60 tablet 0    Sig: Take 1 tablet (40 mg total) by mouth 2 (two)  times daily.      Gastroenterology: Proton Pump Inhibitors Passed - 11/24/2019  3:01 PM      Passed - Valid encounter within last 12 months    Recent Outpatient Visits           1 month ago Essential hypertension   [place], Jarome Matin, RPH-CPP   2 months ago Elevated blood pressure reading   [place], Jarome Matin, RPH-CPP   3 months ago Need for 23-polyvalent pneumococcal polysaccharide vaccine   [place], Stephen L, RPH-CPP   3 months ago Type 2 diabetes mellitus with diabetic polyneuropathy, without long-term current use of insulin ([place])   [place] Karle Plumber B, MD   6 months ago Diabetes mellitus type 2 in nonobese [facility])   [place], MD       Future Appointments             In 3 weeks Ladell Pier, MD [place]              lisinopril (ZESTRIL) 10 MG tablet 90 tablet 0    Sig: Take 1 tablet (10 mg total) by mouth daily.      Cardiovascular:  ACE Inhibitors Failed - 11/24/2019  3:01 PM      Failed - Last BP in normal range    BP Readings from Last 1 Encounters:  10/21/19 (!) 133/91          Passed - Cr in normal range and within 180 days    Creatinine, Ser  Date Value Ref Range Status  10/10/2019 1.04 0.61 - 1.24 mg/dL Final   Creatinine,U  Date Value Ref Range Status  10/13/2007 191.4 mg/dL Final    Comment:    See lab report for associated comment(s)   Creatinine, Urine  Date Value Ref Range Status  04/05/2019 155.79 mg/dL  Final    Comment:    Performed at [facility], [place] [address], [place], [place] [id number]          Passed - K in normal range and within 180 days    Potassium  Date Value Ref Range Status  10/10/2019 3.6 3.5 - 5.1 mmol/L Final          Passed - Patient  is not pregnant      Passed - Valid encounter within last 6 months    Recent Outpatient Visits           1 month ago Essential hypertension   [place], RPH-CPP   2 months ago Elevated blood pressure reading   [place], Annie Main L, RPH-CPP   3 months ago Need for 23-polyvalent pneumococcal polysaccharide vaccine   [place], Stephen L, RPH-CPP   3 months ago Type 2 diabetes mellitus with diabetic polyneuropathy, without long-term current use of insulin ([place])   [place] Karle Plumber B, MD   6 months ago Diabetes mellitus type 2 in nonobese [facility])   [place], MD       Future Appointments             In 3 weeks Ladell Pier, MD [place]              atorvastatin (LIPITOR) 10 MG tablet 90 tablet 0    Sig: Take 1 tablet (10 mg total) by mouth daily.      Cardiovascular:  Antilipid - Statins Failed - 11/24/2019  3:01 PM      Failed - Total Cholesterol in normal range and within 360 days    Cholesterol, Total  Date Value Ref Range Status  04/30/2019 226 (H) 100 - 199 mg/dL Final          Failed - LDL in normal range and within 360 days    LDL Chol Calc (NIH)  Date Value Ref Range Status  04/30/2019 167 (H) 0 - 99 mg/dL Final          Failed - HDL in normal range and within 360 days    HDL  Date Value Ref Range Status  04/30/2019 31 (L) >39 mg/dL Final          Failed - Triglycerides in normal range and within 360 days    Triglycerides  Date Value Ref Range Status  04/30/2019 152 (H) 0 - 149 mg/dL Final          Passed - Patient is not pregnant      Passed - Valid encounter within last 12 months    Recent Outpatient Visits           1 month ago Essential hypertension   [place], Annie Main L, RPH-CPP   2 months ago Elevated blood pressure reading   [place], Annie Main L, RPH-CPP   3 months ago Need for 23-polyvalent pneumococcal polysaccharide vaccine   [place], Annie Main L, RPH-CPP   3 months ago Type 2 diabetes mellitus with diabetic polyneuropathy, without long-term current use of insulin [facility])   [place] Ladell Pier, MD   6 months  ago Diabetes mellitus type 2 in nonobese [facility])   [place], MD       Future Appointments             In 3 weeks Wynetta Emery Dalbert Batman, MD [place]

## 2019-11-26 MED ORDER — LEVETIRACETAM 500 MG PO TABS: 500.0000 mg | ORAL_TABLET | Freq: Two times a day (BID) | ORAL | 1 refills | Status: DC

## 2019-11-27 ENCOUNTER — Telehealth: Payer: Self-pay | Admitting: Neurology

## 2019-11-27 NOTE — Telephone Encounter (Signed)
I have a 4pm opening this Monday if they can do that, thanks!

## 2019-11-27 NOTE — Telephone Encounter (Signed)
[employer] 11/27/19 @ 12:32pm:  "Caller states her brother had a VV last Friday, Dr Delice Lesch is supposed to follow up with him and she is asking if they need to schedule or if the office will do that."

## 2019-11-30 ENCOUNTER — Other Ambulatory Visit: Payer: Self-pay

## 2019-11-30 ENCOUNTER — Encounter: Payer: Self-pay | Admitting: Neurology

## 2019-11-30 ENCOUNTER — Ambulatory Visit (INDEPENDENT_AMBULATORY_CARE_PROVIDER_SITE_OTHER): Payer: Medicaid Other | Admitting: Neurology

## 2019-11-30 ENCOUNTER — Encounter: Payer: Self-pay | Admitting: Nurse Practitioner

## 2019-11-30 VITALS — BP 92/66 | HR 112 | Ht 70.0 in | Wt 163.6 lb

## 2019-11-30 DIAGNOSIS — F0281 Dementia in other diseases classified elsewhere with behavioral disturbance: Secondary | ICD-10-CM | POA: Diagnosis not present

## 2019-11-30 DIAGNOSIS — F09 Unspecified mental disorder due to known physiological condition: Secondary | ICD-10-CM

## 2019-11-30 DIAGNOSIS — R569 Unspecified convulsions: Secondary | ICD-10-CM | POA: Diagnosis not present

## 2019-11-30 DIAGNOSIS — G40909 Epilepsy, unspecified, not intractable, without status epilepticus: Secondary | ICD-10-CM

## 2019-11-30 DIAGNOSIS — I6783 Posterior reversible encephalopathy syndrome: Secondary | ICD-10-CM

## 2019-11-30 DIAGNOSIS — I639 Cerebral infarction, unspecified: Secondary | ICD-10-CM

## 2019-11-30 DIAGNOSIS — F02818 Dementia in other diseases classified elsewhere, unspecified severity, with other behavioral disturbance: Secondary | ICD-10-CM

## 2019-11-30 LAB — HM DIABETES EYE EXAM

## 2019-11-30 MED ORDER — LEVETIRACETAM 500 MG PO TABS: ORAL_TABLET | ORAL | 3 refills | Status: DC

## 2019-11-30 MED ORDER — ESCITALOPRAM OXALATE 10 MG PO TABS
10.0000 mg | ORAL_TABLET | Freq: Every day | ORAL | 11 refills | Status: DC
Start: 2019-11-30 — End: 2019-12-22

## 2019-11-30 NOTE — Patient Instructions (Addendum)
1. Reduce Keppra (Levetiracetam) 500mg : take 1/2 tab in AM, 1 tab in PM  2. Start Lexapro 10mg  every morning  3. A referral will be sent for home Speech therapy for Cognitive therapy  4. It is important to increase physical exercise and do brain stimulation exercises. There are some activities which have therapeutic value and can be useful in keeping you cognitively stimulated. You can try this website: https://www.[employer].org/get-to-know-barrow/centers-programs/neurorehabilitation-center/neuro-rehab-apps-and-games/ which has options, categorized by level of difficulty.  5. Bloodwork for autoimmune panel will be ordered  6. Follow-up in 4-5 months, call for any changes   RECOMMENDATIONS FOR ALL PATIENTS WITH MEMORY PROBLEMS: 1. Continue to exercise (Recommend 30 minutes of walking everyday, or 3 hours every week) 2. Increase social interactions - continue going to [place] and enjoy social gatherings with friends and family 3. Eat healthy, avoid fried foods and eat more fruits and vegetables 4. Maintain adequate blood pressure, blood sugar, and blood cholesterol level. Reducing the risk of stroke and cardiovascular disease also helps promoting better memory. 5. Avoid stressful situations. Live a simple life and avoid aggravations. Organize your time and prepare for the next day in anticipation. 6. Sleep well, avoid any interruptions of sleep and avoid any distractions in the bedroom that may interfere with adequate sleep quality 7. Avoid sugar, avoid sweets as there is a strong link between excessive sugar intake, diabetes, and cognitive impairment The Mediterranean diet has been shown to help patients reduce the risk of progressive memory disorders and reduces cardiovascular risk. This includes eating fish, eat fruits and green leafy vegetables, nuts like almonds and hazelnuts, walnuts, and also use olive oil. Avoid fast foods and fried foods as much as possible. Avoid sweets and sugar as  sugar use has been linked to worsening of memory function.

## 2019-11-30 NOTE — Progress Notes (Signed)
NEUROLOGY FOLLOW UP OFFICE NOTE  [name] [id number] [birth year]  HISTORY OF PRESENT ILLNESS: I had the pleasure of seeing [name] in follow-up in the neurology clinic on 11/30/2019.  The patient was last seen 5 months ago for new onset seizure and cognitive changes. He is again accompanied by his sister [name] who helps supplement the history today.  Records and images were personally reviewed where available. Since his last visit, he had a repeat EEG done 09/2019 which was within normal limits. 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 His Neuropsychological evaluation in 11/2019 showed significant cognitive impairment with extremely low performance on measures of attention, processing speed, memory, and weak scores on measures of executive functioning. His performance was better on measures of visuospatial/constructional abilities and language function. He screened positive for the presence of depression. Profile suggestive of Major Neurocognitive Disorder, etiology likely due to multiple etiologies, including sequelae from the imaging changes. Although there were only small areas of progression to infarction on MRI, there may be additional damage/dysfunction not well visualized. He had a prolonged episode of mental status and uremia. Medication side effects and depression may also be contributory.   Findings discussed with the patient and his sister [name] today. He has poor eye contact. He reports body soreness in both shoulders, with difficulty lifting his left arm up. He denies any heavy lifting or falls. [name] only heard of this today, she states he has been very sedentary. He mostly stays in his room watching TV. He states his  memory comes and goes. He states he is "just bored most of the time," only going out of his room to eat. He denies any gaps in time. He does not remember conversations a couple of times a week. He does not recall if he had talked to [name] about something instead of with [name]. He repeats the same questions several times. [name] has not seen any further staring/glazed look. She reports his eating habits are less regimented now, sometimes he has no appetite. He forgets where he puts things. He sleeps through the night but is also drowsy during the day. He denies any headaches, focal numbness/tingling/weakness. He denies any olfactory/gustatory hallucinations, rising epigastric sensation, myoclonic jerks. He feels dizzy standing up sometimes. No bowel/bladder dysfunction. He is independent with dressing and bathing. He has knee pains. He has occasional blood in stool.  [name] endorses his frustrations are high sometimes since he is just at home with a lot of independence lost.    History on Initial Assessment 06/16/2019: This is a 49 year old right-handed man with a history of hyperlipidemia, diabetes, presenting for evaluation of seizure and cognitive changes. His sister [name] is present during the visit to provide additional information due to continued cognitive changes. He was in his usual state of health until the week of Christmas 2020 when he had a toothache. [name] reports he has very bad teeth, he took Tylenol, then told her he was not feeling good on 12/7. He was talking fine, recognizing his sister, he was confused and out of it, so they brought him to the ER. [name] reported marijuana use, no alcohol or other illicit drugs. His BP was 156/104, HR 112. He was found to be in renal failure with creatinine of 10.38, AST  22, ALT 156. UDS positive for THC. MRI brain without contrast done 04/07/19 showed abnormal signal in the medial temporal lobe/hippocampus and posteromedial left thalamus, mild associated parenchymal  swelling, chronic blood products along the right parietal lobe. He had an 18-hour EEG showing continuous rhythmic 2-3 Hz delta activity in the right temporoparietal region, at times sharply contoured. There was also slowing on the left hemisphere, and intermittent rhythmic generalized delta slowing. During his hospital stay, he had a generalized tonic-clonic seizure on 04/10/19, then another with note of left gaze deviation, left upper extremity extended, unresponsive. EEG showed diffuse slowing and triphasic waves. Repeat EEG showed generalized slowing, triphasic waves. He had a lumbar puncture with WBC 3, protein 53, glucose 62. Negative HSV, VZV, ACE. Negative anti-Jo, dsDNA, SS-A, SS-B, SCL 70, anti-SM Ab. Repeat MRI brain done 04/15/19 showed new changes with fairly extensive subcortical greater than cortical signal abnormality in the bilateral frontoparietal lboes, occipital lobes, and posterior temporal lobes. Patchy cortical restricted diffuse suspicious for acute/early subacute infarcts. There was also new signal change in the bilateral cerebellum and patchy restricted diffusion suspicious for acute/early subacute infarcts. 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 He received 5 days of IV Solu-Medrol. It was noted that his BP on admission was 156/104, as high as 219/98 on 1/4. EEG on 12/29 showed diffuse delta slowing in the right temporoparietal region, slowing on the left hemisphere.   He has a prior history of seizures, his sister reports a few seizures while he was teething. In 2006/2007 he had a couple of seizure where his eyes rolled back with stiffening and shaking. He was evaluated by Dr. Gaynell Face in 2007. At that time, he woke up dizzy with rotatory nystagmus, headache, followed by left focal motor seizure. LP at that time RBCs 11,320, likely a traumatic tap, however due to sudden onset headache and possible seizure, catheter angiogram was done,  which was normal. EEG in 2007 reported occasional sharp transients in the left frontotemporal region. MRI brain no acute changes.   His sister reports that "his memory just went." He did not recognize her when she visited him in the hospital. Physically he is getting better, his sister makes sure he eats regularly. Cognitively he has not improved, they need to repeat and reiterate themselves to him. No staring/unresponsiveness, but he has delayed responses, like he has to overprocess information. He is able to bathe and dress himself, but she has to remind him to shower because he thinks he just had one. Most of the time he is calm, he occasionally gets very agitated for no reason, then calms down. No paranoia or hallucinations. When he initially came home, he did not remember he had a dog and kept calling him "dog," so afraid of him. It took several weeks to be comfortable around him. She reports that he was not taking any medications prior to hospital admission. He used to smoke a lot of weed and "swears up and down that he had not used in years," but [name] says he used it daily. No alcohol use. A few days after hospital discharge, his family found him on the floor, he was hypotensive and had soiled his clothes, then was standing in a pool of blood. He had acute GI bleed secondary to PUD.   He feels his memory is "good, bad sometimes." He has some dizziness, drowsiness, and fatigue. Balance is bad in the mornings. He has some tingling in his legs. He has occasional  headaches in the back of his head with throbbing, sensitivity to lights/sounds. No nausea/vomiting. Vision is sometimes blurred. He has occasional neck pain. No bowel/bladder dysfunction. His sister reports his legs are twitching constantly, decreasing when he lifts his legs up. [name] has not witnessed any seizures, he is taking Levetiracetam 500mg  BID.   PAST MEDICAL HISTORY: Past Medical History:  Diagnosis Date  . Cataract cortical, senile,  bilateral   . Diabetes mellitus, type 2 ([place])   . Duodenal ulcer   . Fatty liver   . Ocular hypertension, bilateral   . Peptic ulcer disease   . Psoriasis   . Seizures ([place])     MEDICATIONS: Current Outpatient Medications on File Prior to Visit  Medication Sig Dispense Refill  . acetaminophen (TYLENOL) 500 MG tablet Take 1 tablet (500 mg total) by mouth every 6 (six) hours as needed for mild pain, moderate pain, fever or headache.    Marland Kitchen atorvastatin (LIPITOR) 10 MG tablet Take 1 tablet (10 mg total) by mouth daily. 90 tablet 0  . doxycycline (VIBRA-TABS) 100 MG tablet Take 1 tablet (100 mg total) by mouth every 12 (twelve) hours. 14 tablet 0  . fexofenadine (ALLEGRA) 180 MG tablet Take 180 mg by mouth daily as needed for allergies or rhinitis.    Marland Kitchen glimepiride (AMARYL) 2 MG tablet Take 1 tablet (2 mg total) by mouth daily before breakfast. 90 tablet 0  . levETIRAcetam (KEPPRA) 500 MG tablet Take 1 tablet (500 mg total) by mouth 2 (two) times daily. 180 tablet 1  . lisinopril (ZESTRIL) 10 MG tablet Take 1 tablet (10 mg total) by mouth daily. 90 tablet 0  . pantoprazole (PROTONIX) 40 MG tablet Take 1 tablet (40 mg total) by mouth 2 (two) times daily. 60 tablet 0   No current facility-administered medications on file prior to visit.    ALLERGIES: Allergies  Allergen Reactions  . Penicillins Hives and Other (See Comments)    Childhood allergy  Did it involve swelling of the face/tongue/throat, SOB, or low BP? Unknown Did it involve sudden or severe rash/hives, skin peeling, or any reaction on the inside of your mouth or nose? Unknown Did you need to seek medical attention at a hospital or doctor's office? Unknown When did it last happen?Pt was a child If all above answers are "NO", may proceed with cephalosporin use. Sister states he just breaks out in hives unsure about other reactions.      FAMILY HISTORY: Family History  Problem Relation Age of Onset  . Diabetes Mother    . Diabetes Sister   . Diabetes Father   . Esophageal cancer Neg Hx   . Stomach cancer Neg Hx   . Colon cancer Neg Hx   . Pancreatic cancer Neg Hx     SOCIAL HISTORY: Social History   Socioeconomic History  . Marital status: Single    Spouse name: Not on file  . Number of children: Not on file  . Years of education: Not on file  . Highest education level: Not on file  Occupational History  . Not on file  Tobacco Use  . Smoking status: Never Smoker  . Smokeless tobacco: Never Used  Vaping Use  . Vaping Use: Never used  Substance and Sexual Activity  . Alcohol use: Not Currently  . Drug use: Not Currently    Types: Marijuana    Comment: 5 times per month (per 04/21/19 hospital note)  . Sexual activity: Not on file  Other Topics Concern  .  Not on file  Social History Narrative  . Not on file   Social Determinants of Health   Financial Resource Strain:   . Difficulty of Paying Living Expenses: Not on file  Food Insecurity:   . Worried About Charity fundraiser in the Last Year: Not on file  . Ran Out of Food in the Last Year: Not on file  Transportation Needs:   . Lack of Transportation (Medical): Not on file  . Lack of Transportation (Non-Medical): Not on file  Physical Activity:   . Days of Exercise per Week: Not on file  . Minutes of Exercise per Session: Not on file  Stress:   . Feeling of Stress : Not on file  Social Connections:   . Frequency of Communication with Friends and Family: Not on file  . Frequency of Social Gatherings with Friends and Family: Not on file  . Attends Religious Services: Not on file  . Active Member of Clubs or Organizations: Not on file  . Attends Archivist Meetings: Not on file  . Marital Status: Not on file  Intimate Partner Violence:   . Fear of Current or Ex-Partner: Not on file  . Emotionally Abused: Not on file  . Physically Abused: Not on file  . Sexually Abused: Not on file    PHYSICAL EXAM: Vitals:    11/30/19 1046  BP: 92/66  Pulse: (!) 112  SpO2: 98%   General: No acute distress, poor eye contact Head:  Normocephalic/atraumatic Skin/Extremities: No rash, no edema Neurological Exam: alert and oriented to person, place. No aphasia or dysarthria. Fund of knowledge is reduced.  Recent and remote memory are impaired.  Attention and concentration are reduced. Cranial nerves: Pupils equal, round. Extraocular movements intact with no nystagmus. Visual fields full. Facial sensation intact. No facial asymmetry. Motor: Bulk and tone normal, muscle strength 5/5 except for pain with left shoulder abduction. Sensation decreased to cold and pin on left UE and LE. Deep tendon reflexes +2 throughout, toes downgoing.  Finger to nose testing intact.  Gait narrow-based and steady, able to tandem walk adequately.  Romberg negative.   IMPRESSION: This is a 49 yo RH man with a history of hyperlipidemia, diabetes, remote seizures, in his usual state of health until the end of December 2020. He was found to be in renal failure, initial MRI brain showed changes in the left medial temporal lobe/hippocampus and left thalamus. During his hospital stay, he had a GTC with left gaze deviation. Repeat MRI brain a few days later showed extensive signal abnormality in the subcortical bilateral frontoparietal lobes, occipital lobes, and posterior temporal lobes, with restricted diffusion suspicious for acute/early subacute infarcts. 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 His most recent MRI brain done 09/2019 improved from prior, scattered small areas of cortical encephalomalacia, extent much less compared to prior, suspect PRES with a few areas progressing to infarction. Repeat EEG within normal limits. 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 We discussed results of testing and the  possibility that he may not return to his prior baseline but potential improvement with management of other treatable causes noted above. Would do [facility] autoimmune serum panel. We discussed starting cognitive therapy with speech therapy, as well as starting Lexapro 10mg  daily. Side effects discussed. He may also benefit from speaking to a therapist at some point. We discussed reducing Levetiracetam to 500mg  1/2 tab in AM,  1 tab in PM, if this is contributing to his symptoms in any way. We discussed the importance of control of vascular risk factors, physical exercise, and brain stimulation exercises for brain health. Discuss shoulder pain with PCP. Follow-up in 4 months, they know to call for any changes.   Thank you for allowing me to participate in his care.  Please do not hesitate to call for any questions or concerns.   Ellouise Newer, M.D.   CC: Dr. Wynetta Emery

## 2019-12-03 ENCOUNTER — Telehealth: Payer: Self-pay | Admitting: Internal Medicine

## 2019-12-03 NOTE — Telephone Encounter (Signed)
Called to inform the doctor that at this time they have a staff shortage and will not be able to accept the referral for a speech therapist.  They suggest that it be sent to another facility.  Please advise and call with any questions at [phone]

## 2019-12-03 NOTE — Telephone Encounter (Signed)
Called to inform the doctor that the family has requested that she do the speech and cognitive assessment next Tuesday, so there will be a delay in service.  For questions, please call [phone], leave voice message if there is no answer

## 2019-12-03 NOTE — Telephone Encounter (Signed)
Good Afternoon  I don't see a referral placed from Dr Wynetta Emery  the referral was place  for [facility]   Thank you .

## 2019-12-04 NOTE — Telephone Encounter (Signed)
Will forward to pcp

## 2019-12-06 NOTE — Telephone Encounter (Signed)
Referral was placed by neuro

## 2019-12-09 ENCOUNTER — Telehealth: Payer: Self-pay | Admitting: Internal Medicine

## 2019-12-09 NOTE — Telephone Encounter (Signed)
Copied from [place] [phone] [phone]. Topic: Quick Communication - Home Health Verbal Orders >> Dec 09, 2019  9:50 AM Leward Quan A wrote: Caller/Agency: [facility] / [facility]  Callback Number: [phone] [place] [facility] Requesting OT/PT/Skilled Nursing/Social Work/Speech Therapy: Speech Therapy  Frequency: 2 w 9,  need OT evaluation  also

## 2019-12-10 NOTE — Telephone Encounter (Signed)
Calling again to get a response for verbal.  Also is concerned with patient falling frequently.  Please advise and call at [phone]

## 2019-12-10 NOTE — Telephone Encounter (Signed)
Returned Wells Guiles call and left a detailed vm giving verbal orders and if she has any questions or concerns to give a call

## 2019-12-10 NOTE — Telephone Encounter (Signed)
Will forward to pcp regarding falls

## 2019-12-11 NOTE — Telephone Encounter (Signed)
Phone call returned to [facility]. She is with [facility].  She has seen patient.  She is requesting orders to allow for some home PT and OT and speech therapy. Okay given

## 2019-12-18 ENCOUNTER — Ambulatory Visit: Payer: Medicaid Other | Admitting: Internal Medicine

## 2019-12-18 ENCOUNTER — Encounter: Payer: Self-pay | Admitting: Internal Medicine

## 2019-12-22 ENCOUNTER — Ambulatory Visit: Payer: Medicaid Other | Attending: Internal Medicine | Admitting: Internal Medicine

## 2019-12-22 ENCOUNTER — Other Ambulatory Visit: Payer: Self-pay | Admitting: Neurology

## 2019-12-22 ENCOUNTER — Other Ambulatory Visit: Payer: Self-pay

## 2019-12-22 DIAGNOSIS — K625 Hemorrhage of anus and rectum: Secondary | ICD-10-CM

## 2019-12-22 DIAGNOSIS — Z23 Encounter for immunization: Secondary | ICD-10-CM

## 2019-12-22 DIAGNOSIS — K0889 Other specified disorders of teeth and supporting structures: Secondary | ICD-10-CM

## 2019-12-22 DIAGNOSIS — I10 Essential (primary) hypertension: Secondary | ICD-10-CM

## 2019-12-22 NOTE — Progress Notes (Signed)
Virtual Visit via Telephone Note Due to current restrictions/limitations of in-office visits due to the COVID-19 pandemic, this scheduled clinical appointment was converted to a telehealth visit  I connected with [name] on 12/22/19 at 3:25 p.m by telephone and verified that I am speaking with the correct person using two identifiers. I am in my office.  The patient is at home.  Only the patient, his sister [name] and  myself participated in this encounter.  I discussed the limitations, risks, security and privacy concerns of performing an evaluation and management service by telephone and the availability of in person appointments. I also discussed with the patient that there may be a patient responsible charge related to this service. The patient expressed understanding and agreed to proceed.   History of Present Illness: Pt with hx of DM type 2, Sz disorder, PUD (GIB 04/2019), HTN, fatty liver, HL, major cognitive disorder.  Patient was on my schedule last Friday but was a no-show.  His sister subsequently sent me a MyChart message stating that there was a miscommunication because she was told that it was going to be a telephone visit but they actually had it down as an in person visit.  They have rescheduled him for November.  I had the front desk call them today to see whether he would be available to do a telephone visit today which they were.  Since last visit with me, he was hospitalized in July for cellulitis of his right wrist.  Since last visit he also had a neuropsychiatric eval done by the psychologist Dr. Nicole Kindred who diagnosed him with major neurocognitive disorder likely due to multiple etiologies and adjustment disorder.  His neurologist Dr. Delice Lesch suggested starting cognitive therapy with speech therapy and started him on Lexapro. -He has had 3 speech therapy sessions so far with Wells Guiles.  He has been having some problems with his teeth.  Since the beginning of this month, his  sister states that he was not eating much.  He later started complaining of toothache in the left lower jaw.  He saw the dentist and was diagnosed with a broken root tip on the RT side and was placed on antibiotics.  He is still complaining of pain on the left lower jaw so they have made another appointment with the dentist.  Patient reports noticing blood in the stool intermittently since he left the hospital in July.  His sister tells me that he first started mentioning it to them last month.  He had colonoscopy and EGD in April of this year.  Colonoscopy was normal.  EGD revealed ulcer had healed.  Patient endorses some dizziness at times when he wakes up through the night to use the bathroom. -He had seen our clinical pharmacist in June for blood pressure check.  Started on lisinopril.  He took it for a month and then had been out of it.  Blood pressure was checked by the therapist each time she came to the house and readings have been good while he was out of the medication for over a month.  The highest reading was 122/80.  [facility] call them a few days ago telling him that refill on lisinopril was ready.  They picked it up and started giving it to him again.  However his sister wonders whether she needs to stop it since he is having some dizziness and blood pressure was good without it the past several weeks.  Outpatient Encounter Medications as of 12/22/2019  Medication Sig  .  acetaminophen (TYLENOL) 500 MG tablet Take 1 tablet (500 mg total) by mouth every 6 (six) hours as needed for mild pain, moderate pain, fever or headache.  Marland Kitchen atorvastatin (LIPITOR) 10 MG tablet Take 1 tablet (10 mg total) by mouth daily.  Marland Kitchen escitalopram (LEXAPRO) 10 MG tablet TAKE 1 TABLET BY MOUTH EVERY DAY  . fexofenadine (ALLEGRA) 180 MG tablet Take 180 mg by mouth daily as needed for allergies or rhinitis.  Marland Kitchen glimepiride (AMARYL) 2 MG tablet Take 1 tablet (2 mg total) by mouth daily before breakfast.  . levETIRAcetam (KEPPRA)  500 MG tablet Take 1/2 tab in AM, 1 tab in PM  . lisinopril (ZESTRIL) 10 MG tablet Take 1 tablet (10 mg total) by mouth daily.  . pantoprazole (PROTONIX) 40 MG tablet Take 1 tablet (40 mg total) by mouth 2 (two) times daily.   No facility-administered encounter medications on file as of 12/22/2019.      Observations/Objective: Lab Results  Component Value Date   WBC 7.3 10/10/2019   HGB 12.6 (L) 10/10/2019   HCT 39.1 10/10/2019   MCV 78.5 (L) 10/10/2019   PLT 209 10/10/2019     Assessment and Plan: 1. Rectal bleeding Patient will come to the lab to have CBC drawn.  If it reveals anemia.  We will get him back with the gastroenterologist. - CBC; Future  2. Essential hypertension Hold lisinopril for now since blood pressure has been normal without it.  Advised to check his blood pressure at least twice a week with goal being 130/80 or lower.  His sister will send me some readings  3. Tooth ache Has follow-up with the dentist  4. Need for Tdap vaccination 5. Need for influenza vaccination When patient comes to the lab tomorrow, he will see the clinical pharmacist to get flu shot and tetanus vaccine.  These are both outstanding on his health maintenance.  Patient is agreeable to receiving these vaccines.   Follow Up Instructions: As previously scheduled   I discussed the assessment and treatment plan with the patient. The patient was provided an opportunity to ask questions and all were answered. The patient agreed with the plan and demonstrated an understanding of the instructions.   The patient was advised to call back or seek an in-person evaluation if the symptoms worsen or if the condition fails to improve as anticipated.  I provided 15 minutes of non-face-to-face time during this encounter.   Karle Plumber, MD

## 2019-12-23 ENCOUNTER — Ambulatory Visit: Payer: Medicaid Other | Attending: Internal Medicine | Admitting: Pharmacist

## 2019-12-23 DIAGNOSIS — Z23 Encounter for immunization: Secondary | ICD-10-CM

## 2019-12-23 DIAGNOSIS — K625 Hemorrhage of anus and rectum: Secondary | ICD-10-CM

## 2019-12-23 NOTE — Progress Notes (Signed)
Patient presents for vaccination against influenza and tetanus per orders of Dr. Wynetta Emery. Consent given. Counseling provided. No contraindications exists. Vaccine administered without incident.   Benard Halsted, PharmD, [place] [phone]

## 2019-12-24 LAB — CBC
Hematocrit: 43 % (ref 37.5–51.0)
Hemoglobin: 13.9 g/dL (ref 13.0–17.7)
MCH: 25.6 pg — ABNORMAL LOW (ref 26.6–33.0)
MCHC: 32.3 g/dL (ref 31.5–35.7)
MCV: 79 fL (ref 79–97)
Platelets: 211 10*3/uL (ref 150–450)
RBC: 5.44 x10E6/uL (ref 4.14–5.80)
RDW: 13 % (ref 11.6–15.4)
WBC: 5.6 10*3/uL (ref 3.4–10.8)

## 2019-12-26 MED ORDER — ACETAMINOPHEN-CODEINE #3 300-30 MG PO TABS: 1.0000 | ORAL_TABLET | Freq: Four times a day (QID) | ORAL | 0 refills | Status: DC | PRN

## 2019-12-26 MED ORDER — CLINDAMYCIN HCL 300 MG PO CAPS: 300.0000 mg | ORAL_CAPSULE | Freq: Three times a day (TID) | ORAL | 0 refills | Status: DC

## 2020-01-03 ENCOUNTER — Other Ambulatory Visit: Payer: Self-pay | Admitting: Internal Medicine

## 2020-01-03 DIAGNOSIS — K279 Peptic ulcer, site unspecified, unspecified as acute or chronic, without hemorrhage or perforation: Secondary | ICD-10-CM

## 2020-01-03 NOTE — Telephone Encounter (Signed)
Requested Prescriptions  Pending Prescriptions Disp Refills  . pantoprazole (PROTONIX) 40 MG tablet [Pharmacy Med Name: PANTOPRAZOLE SOD DR 40 MG TAB] 180 tablet 2    Sig: TAKE 1 TABLET BY MOUTH TWICE A DAY     Gastroenterology: Proton Pump Inhibitors Passed - 01/03/2020  1:54 PM      Passed - Valid encounter within last 12 months    Recent Outpatient Visits          1 week ago Need for influenza vaccination   [place], R[facility]   1 week ago Rectal bleeding   [place], MD   2 months ago Essential hypertension   [place], Jarome Matin, RPH-CPP   3 months ago Elevated blood pressure reading   [place], Jarome Matin, RPH-CPP   4 months ago Need for 23-polyvalent pneumococcal polysaccharide vaccine   [place], RPH-CPP      Future Appointments            In 1 month Wynetta Emery, Dalbert Batman, MD [place]

## 2020-01-06 ENCOUNTER — Telehealth: Payer: Self-pay | Admitting: Internal Medicine

## 2020-01-06 NOTE — Telephone Encounter (Signed)
Copied from [place] [id number]. Topic: General - Other >> Jan 01, 2020 11:43 AM Yvette Rack wrote: Reason for CRM: Randall Hiss with [facility] stated there is a delay in OT evaluation due to caregiver sister requests to delay the visit. Randall Hiss stated a visit is scheduled for Monday. Cb# [phone]

## 2020-01-12 ENCOUNTER — Telehealth: Payer: Self-pay | Admitting: Internal Medicine

## 2020-01-12 NOTE — Telephone Encounter (Signed)
Returned call and left a detailed vm giving verbal orders(on [name] vm)

## 2020-01-12 NOTE — Telephone Encounter (Signed)
Requesting verbal for OT - 1xwk 3 effective today, 10/05 thru 10/22,  Questions, please call [phone]

## 2020-01-15 ENCOUNTER — Ambulatory Visit: Payer: Medicaid Other | Admitting: Neurology

## 2020-01-22 ENCOUNTER — Ambulatory Visit: Payer: Self-pay | Admitting: *Deleted

## 2020-01-22 NOTE — Telephone Encounter (Signed)
°  Call received from [place] from [facility], [place] to report elevated B/P 134/99  And pulse of 101 approximately 1 hour ago. Therapist no longer with patient. Attempted to call patient's sister , no answer unable to leave message. Called patient at home and patient reports he feels "ok" and was aware of elevated B/P during OT session.Patient reports dizziness at night when he gets up to go to bathroom. Denies dizziness , blurred vision , double vision, headache at this time. Patient's sister unavailable to recheck patient's B/P. Patient verbalized understanding to have his sister call back if needed for elevation in patient's B/P and pulse. Care  Advise given. Patient verbalized understanding of care advise and to call back if symptoms worsen.   Reason for Disposition  [7] Systolic BP  >= 867 OR Diastolic >= 80 AND [5] pregnant  Answer Assessment - Initial Assessment Questions 1. BLOOD PRESSURE: "What is the blood pressure?" "Did you take at least two measurements 5 minutes apart?"     134/99 1 hour ago at rest per [place] did not take 2 measurements  2. ONSET: "When did you take your blood pressure?"     1 hour ago  3. HOW: "How did you obtain the blood pressure?" (e.g., visiting nurse, automatic home BP monitor)     Na  4. HISTORY: "Do you have a history of high blood pressure?"     yes 5. MEDICATIONS: "Are you taking any medications for blood pressure?" "Have you missed any doses recently?"     lisinopril 6. OTHER SYMPTOMS: "Do you have any symptoms?" (e.g., headache, chest pain, blurred vision, difficulty breathing, weakness)     Na, OT no longer with patient  Protocols used: BLOOD PRESSURE - HIGH-A-AH

## 2020-01-23 NOTE — Telephone Encounter (Signed)
Will forward to pcp

## 2020-02-01 ENCOUNTER — Telehealth: Payer: Self-pay | Admitting: Internal Medicine

## 2020-02-01 NOTE — Telephone Encounter (Signed)
Copied from [place] [phone]. Topic: Quick Communication - Home Health Verbal Orders >> Feb 01, 2020  1:25 PM Gillis Ends D wrote: Caller/Agency: [facility] Cindee Salt is the therapist Callback Number: [phone] Requesting OT/PT/Skilled Nursing/Social Work/Speech Therapy: OT Frequency: continuation to OT 1 time a week for 2 weeks (01-30-20 til 02-12-20)

## 2020-02-01 NOTE — Telephone Encounter (Signed)
Returned Cherlynn Polo call and left a detailed vm giving verbal orders and if he has any questions or concerns to give a call

## 2020-02-03 ENCOUNTER — Telehealth: Payer: Self-pay

## 2020-02-03 DIAGNOSIS — E1142 Type 2 diabetes mellitus with diabetic polyneuropathy: Secondary | ICD-10-CM

## 2020-02-03 NOTE — Telephone Encounter (Signed)
Caitlyn nurse case manager stated that patient needs a blood pressure cuff and diabetes supplies. Caitlyn medical records request that was sent via fax and wasn't never given a reply. Patient also previously had a stroke and from what the nurse case manager could see he isnt on any anticoagulant medication. Caitlyn can be reached at [phone]

## 2020-02-04 ENCOUNTER — Other Ambulatory Visit: Payer: Self-pay

## 2020-02-04 MED ORDER — ACCU-CHEK FASTCLIX LANCETS MISC: 0 refills | Status: DC

## 2020-02-04 MED ORDER — ACCU-CHEK GUIDE VI STRP: ORAL_STRIP | 12 refills | Status: DC

## 2020-02-04 MED ORDER — ACCU-CHEK GUIDE ME W/DEVICE KIT: PACK | 0 refills | Status: DC

## 2020-02-04 NOTE — Telephone Encounter (Signed)
Returned [name] to get a fax number for the blood pressure monitor and for a call back

## 2020-02-05 NOTE — Telephone Encounter (Signed)
Returned [name] to get a fax number for the blood pressure monitor and for a call back

## 2020-02-11 ENCOUNTER — Ambulatory Visit: Payer: Medicaid Other | Admitting: Internal Medicine

## 2020-02-11 ENCOUNTER — Telehealth: Payer: Self-pay | Admitting: Internal Medicine

## 2020-02-11 NOTE — Telephone Encounter (Signed)
Stormy Card called back and said it's okay to leave a voice mail the line is confidential. Please advise

## 2020-02-11 NOTE — Telephone Encounter (Signed)
Copied from [place] [phone]. Topic: Quick Communication - See Telephone Encounter >> Feb 11, 2020  2:34 PM Loma Boston wrote: CRM for notification. See Telephone encounter for: 02/11/20. [facility], Stormy Card calling, pt is making progress but needs 2 times a wk for 9 more weeks for memory and audio processing Call back for verbal is [phone]

## 2020-02-12 NOTE — Telephone Encounter (Signed)
Contacted Wells Guiles and gave VO as requested, she will fax order for provider's signature.

## 2020-02-15 ENCOUNTER — Telehealth: Payer: Self-pay | Admitting: Internal Medicine

## 2020-02-15 NOTE — Telephone Encounter (Signed)
Called to request home health OT for patient - 1x wk 4 effective today - Nov 8 - Dec. 3.   Also wanted to report that sister stated patient needs a new BP cuff.  Patient BP has been fluctuating high and they are going to monitor it and report changes to the doctor.  If there are any questions, please call [phone]

## 2020-02-16 ENCOUNTER — Telehealth: Payer: Self-pay

## 2020-02-16 NOTE — Telephone Encounter (Signed)
Order received for home BP monitor,   Call placed to [place], spoke to [facility] who stated that [facility] has covered home BP monitors.  Call placed to patient's sister, [name].  She said that she has a paper prescription for the BP monitor but has not been able to find a place to have it filled.  Informed her that this CM can send the rx to [place]. She was very appreciative and said she would check with [facility] later to inquire when she can pick up the monitor. Provided her with the phone number for [facility].  Order faxed to [place].

## 2020-02-25 ENCOUNTER — Other Ambulatory Visit: Payer: Self-pay | Admitting: Internal Medicine

## 2020-02-25 DIAGNOSIS — E785 Hyperlipidemia, unspecified: Secondary | ICD-10-CM

## 2020-03-14 ENCOUNTER — Ambulatory Visit: Payer: Medicaid Other | Admitting: Internal Medicine

## 2020-03-18 ENCOUNTER — Ambulatory Visit: Payer: Medicaid Other | Attending: Internal Medicine | Admitting: Internal Medicine

## 2020-03-18 ENCOUNTER — Encounter: Payer: Self-pay | Admitting: Internal Medicine

## 2020-03-18 ENCOUNTER — Other Ambulatory Visit: Payer: Self-pay

## 2020-03-18 VITALS — BP 128/90 | HR 81 | Temp 96.4°F | Resp 16 | Wt 167.8 lb

## 2020-03-18 DIAGNOSIS — I10 Essential (primary) hypertension: Secondary | ICD-10-CM

## 2020-03-18 DIAGNOSIS — F321 Major depressive disorder, single episode, moderate: Secondary | ICD-10-CM

## 2020-03-18 DIAGNOSIS — E1142 Type 2 diabetes mellitus with diabetic polyneuropathy: Secondary | ICD-10-CM

## 2020-03-18 DIAGNOSIS — L309 Dermatitis, unspecified: Secondary | ICD-10-CM

## 2020-03-18 DIAGNOSIS — R419 Unspecified symptoms and signs involving cognitive functions and awareness: Secondary | ICD-10-CM | POA: Insufficient documentation

## 2020-03-18 DIAGNOSIS — K625 Hemorrhage of anus and rectum: Secondary | ICD-10-CM | POA: Diagnosis not present

## 2020-03-18 LAB — POCT GLYCOSYLATED HEMOGLOBIN (HGB A1C): HbA1c, POC (controlled diabetic range): 7.5 % — AB (ref 0.0–7.0)

## 2020-03-18 LAB — GLUCOSE, POCT (MANUAL RESULT ENTRY): POC Glucose: 126 mg/dl — AB (ref 70–99)

## 2020-03-18 MED ORDER — TRIAMCINOLONE ACETONIDE 0.1 % EX CREA: 1.0000 "application " | TOPICAL_CREAM | Freq: Two times a day (BID) | CUTANEOUS | 0 refills | Status: DC

## 2020-03-18 NOTE — Progress Notes (Signed)
Patient ID: [name], male    DOB: 11-14-1970  MRN: [id number]  CC: Diabetes and Hypertension   Subjective: [name] is a 49 y.o. male who presents for chronic ds management.  Sister, [name], is with him.  His other sister also took part in this encounter via speaker telephone. His concerns today include:  Pt with hx of DM type 2, Sz disorder, PUD (GIB 04/2019), HTN, fatty liver, HL, major cognitive disorder (patient had neuropsychiatric evaluation done by Dr. Alphonzo Severance 11/2019).  Patient complains of itchy rash on his arms x3 to 4 weeks.  No known initiating factors.  No recent changes in body products.  He changes his bed linens regularly.  No one else in the house with rash.  He is not aware of any food allergies.  They do have a dog at home who is kept up-to-date with flee and tick medications    Still having rectal bleeding every time he uses bathroom.  He states that he notices a few drops here and there.  Reports bowel movements are soft.  He had reported the same thing on last telephone visit with me.  We have checked a CBC that was normal.  DM:  Not checking BS.  Sister reports he is compliant with taking the Amaryl.  Reports that he likes sweet snacks and greasy foods.  He likes cheese and butter on his foods.  Not very active.  He tells me that he sleeps most of the day.  Sister reports that with a neurologist had decreased the the morning dose of the Keppra to half of the 500 mg to help decrease sleepiness during the day.  Sister is wondering whether the the Lexapro can be changed to an evening dose instead of morning in case it contributes to him feeling tired during the day.  HTN: On last visit, we held off on having him take lisinopril because his blood pressures were running low.  Sister reports that he has speech therapist coming out 2 times a week.  The therapist checks his blood pressure every time she comes and reports that them that the blood pressure is good.  However  they have not been writing it down.  She will make appoint to have the therapist write it down and will send me a few of the readings through [place].   Patient doing speech therapy twice a week.  His sisters have noticed improvement in him being more interactive.  However they feel that progresses loss between appointments as patient does not apply what he has learned.  They have given him activity books to use between appointments but patient does not take the initiative to use them.  In regards to his memory, sisters report that he does okay for a week or 2 and then he has intermittent episodes where he is confused and not relating to them well.  He is wanting to know whether he can start driving.  States that he does not plan to drive far.  He wants to drive down the street just to get out of the house.  Sisters reports that they do take him when they go shopping and other places..  Patient Active Problem List   Diagnosis Date Noted  . Cellulitis of right upper extremity 10/10/2019  . Cellulitis of right wrist 10/09/2019  . Elevated blood pressure reading 08/24/2019  . Memory changes 05/25/2019  . Non-traumatic rhabdomyolysis   . Acute GI bleeding 04/21/2019  . Diabetes mellitus type  2 in nonobese ([place]) 04/21/2019  . Seizures ([place]) 04/19/2019  . Acute blood loss anemia   . Duodenal ulcer hemorrhage   . Acute gastric ulcer without hemorrhage or perforation   . Acute renal failure (ARF) ([place]) 04/05/2019  . Acute metabolic encephalopathy 04/17/3233     Current Outpatient Medications on File Prior to Visit  Medication Sig Dispense Refill  . Accu-Chek FastClix Lancets MISC UAD 100 each 0  . acetaminophen (TYLENOL) 500 MG tablet Take 1 tablet (500 mg total) by mouth every 6 (six) hours as needed for mild pain, moderate pain, fever or headache.    Marland Kitchen atorvastatin (LIPITOR) 10 MG tablet TAKE 1 TABLET BY MOUTH EVERY DAY 30 tablet 0  . Blood Glucose Monitoring Suppl (ACCU-CHEK GUIDE ME) w/Device  KIT UAD 1 kit 0  . escitalopram (LEXAPRO) 10 MG tablet TAKE 1 TABLET BY MOUTH EVERY DAY 90 tablet 0  . fexofenadine (ALLEGRA) 180 MG tablet Take 180 mg by mouth daily as needed for allergies or rhinitis.    Marland Kitchen glimepiride (AMARYL) 2 MG tablet Take 1 tablet (2 mg total) by mouth daily before breakfast. 90 tablet 0  . glucose blood (ACCU-CHEK GUIDE) test strip Use as instructed 100 each 12  . levETIRAcetam (KEPPRA) 500 MG tablet Take 1/2 tab in AM, 1 tab in PM 135 tablet 3  . pantoprazole (PROTONIX) 40 MG tablet TAKE 1 TABLET BY MOUTH TWICE A DAY 180 tablet 2   No current facility-administered medications on file prior to visit.    Allergies  Allergen Reactions  . Penicillins Hives and Other (See Comments)    Childhood allergy  Did it involve swelling of the face/tongue/throat, SOB, or low BP? Unknown Did it involve sudden or severe rash/hives, skin peeling, or any reaction on the inside of your mouth or nose? Unknown Did you need to seek medical attention at a hospital or doctor's office? Unknown When did it last happen?Pt was a child If all above answers are "NO", may proceed with cephalosporin use. Sister states he just breaks out in hives unsure about other reactions.      Social History   Socioeconomic History  . Marital status: Single    Spouse name: Not on file  . Number of children: Not on file  . Years of education: Not on file  . Highest education level: Not on file  Occupational History  . Not on file  Tobacco Use  . Smoking status: Never Smoker  . Smokeless tobacco: Never Used  Vaping Use  . Vaping Use: Never used  Substance and Sexual Activity  . Alcohol use: Not Currently  . Drug use: Not Currently    Types: Marijuana    Comment: 5 times per month (per 04/21/19 hospital note)  . Sexual activity: Not on file  Other Topics Concern  . Not on file  Social History Narrative  . Not on file   Social Determinants of Health   Financial Resource Strain: Not on  file  Food Insecurity: Not on file  Transportation Needs: Not on file  Physical Activity: Not on file  Stress: Not on file  Social Connections: Not on file  Intimate Partner Violence: Not on file    Family History  Problem Relation Age of Onset  . Diabetes Mother   . Diabetes Sister   . Diabetes Father   . Esophageal cancer Neg Hx   . Stomach cancer Neg Hx   . Colon cancer Neg Hx   . Pancreatic cancer Neg  Hx     Past Surgical History:  Procedure Laterality Date  . BIOPSY  04/17/2019   Procedure: BIOPSY;  Surgeon: Irene Shipper, MD;  Location: [facility] ENDOSCOPY;  Service: Endoscopy;;  . ESOPHAGOGASTRODUODENOSCOPY Left 04/21/2019   Procedure: ESOPHAGOGASTRODUODENOSCOPY (EGD);  Surgeon: Jerene Bears, MD;  Location: [facility] ENDOSCOPY;  Service: Gastroenterology;  Laterality: Left;  . ESOPHAGOGASTRODUODENOSCOPY (EGD) WITH PROPOFOL N/A 04/17/2019   Procedure: ESOPHAGOGASTRODUODENOSCOPY (EGD) WITH PROPOFOL;  Surgeon: Irene Shipper, MD;  Location: [facility] ENDOSCOPY;  Service: Endoscopy;  Laterality: N/A;  . ESOPHAGOGASTRODUODENOSCOPY (EGD) WITH PROPOFOL N/A 04/23/2019   Procedure: ESOPHAGOGASTRODUODENOSCOPY (EGD) WITH PROPOFOL;  Surgeon: Jerene Bears, MD;  Location: [facility] ENDOSCOPY;  Service: Gastroenterology;  Laterality: N/A;  . HEMOSTASIS CLIP PLACEMENT  04/17/2019   Procedure: HEMOSTASIS CLIP PLACEMENT;  Surgeon: Irene Shipper, MD;  Location[facility] END[facility]COPY;  Service: Endoscopy;;  . HEMOSTASIS CLIP PLACEMENT  04/21/2019   Procedure: HEMOSTASIS CLIP PLACEMENT;  Surgeon: Jerene Bears, MD;  Location: [facility] ENDOSCOPY;  Service: Gastroenterology;;  . IR FLUORO GUIDE CV LINE RIGHT  04/10/2019  . IR REMOVAL TUN CV CATH W/O FL  04/17/2019  . IR US GUIDE VASC ACCESS RIGHT  04/10/2019  . SUBMUCOSAL INJECTION  04/17/2019   Procedure: SUBMUCOSAL INJECTION;  Surgeon: Irene Shipper, MD;  Location: [facility] ENDOSCOPY;  Service: Endoscopy;;    ROS: Review of Systems Negative except as stated above  PHYSICAL EXAM: BP 128/90   Pulse  81   Temp (!) 96.4 F (35.8 C)   Resp 16   Wt 167 lb 12.8 oz (76.1 kg)   SpO2 97%   BMI 24.08 kg/m    Physical Exam  General appearance - alert, well appearing, middle-age male in NAD  Mental status -patient is a little bit more interactive today.  He knows the day of the week but does not know the calendar date.  He does not remember who is the current [place] president. Eyes: Pink conjunctiva Mouth - mucous membranes moist, pharynx normal without lesions Neck - supple, no significant adenopathy Chest - clear to auscultation, no wheezes, rales or rhonchi, symmetric air entry Heart - normal rate, regular rhythm, normal S1, S2, no murmurs, rubs, clicks or gallops Extremities -no lower extremity edema.   Skin -patient has several scattered what almost looks like small insect bites on both upper arms.  None observed on the trunk or lower extremities.  CMP Latest Ref Rng & Units 10/10/2019 10/09/2019 04/30/2019  Glucose 70 - 99 mg/dL 189(H) 85 66  BUN 6 - 20 mg/dL _0 Creatinine 0.61 - 1.24 mg/dL 1.04 0.93 1.17  Sodium 135 - 145 mmol/L 138 139 138  Potassium 3.5 - 5.1 mmol/L 3.6 4.0 4.8  Chloride 98 - 111 mmol/L 104 105 102  CO2 22 - 32 mmol/L _1 Calcium 8.9 - 10.3 mg/dL 8.6(L) 9.6 8.8  Total Protein 6.5 - 8.1 g/dL 5.8(L) 6.8 6.2  Total Bilirubin 0.3 - 1.2 mg/dL 0.3 0.6 0.3  Alkaline Phos 38 - 126 U/L 64 73 82  AST 15 - 41 U/L 24 26 35  ALT 0 - 44 U/L 37 41 44   Lipid Panel     Component Value Date/Time   CHOL 226 (H) 04/30/2019 1633   TRIG 152 (H) 04/30/2019 1633   HDL 31 (L) 04/30/2019 1633   CHOLHDL 7.3 (H) 04/30/2019 1633   LDLCALC 167 (H) 04/30/2019 1633    CBC    Component Value Date/Time  WBC 5.6 12/23/2019 0959   WBC 7.3 10/10/2019 0027   RBC 5.44 12/23/2019 0959   RBC 4.98 10/10/2019 0027   HGB 13.9 12/23/2019 0959   HCT 43.0 12/23/2019 0959   PLT 211 12/23/2019 0959   MCV 79 12/23/2019 0959   MCH 25.6 (L) 12/23/2019 0959   MCH 25.3 (L) 10/10/2019  0027   MCHC 32.3 12/23/2019 0959   MCHC 32.2 10/10/2019 0027   RDW 13.0 12/23/2019 0959   LYMPHSABS 2.1 10/09/2019 1304   MONOABS 0.7 10/09/2019 1304   EOSABS 0.9 (H) 10/09/2019 1304   BASOSABS 0.1 10/09/2019 1304   Depression screen PHQ 2/9 03/18/2020 05/25/2019  Decreased Interest 3 2  Down, Depressed, Hopeless 1 0  PHQ - 2 Score 4 2  Altered sleeping 3 2  Tired, decreased energy 2 0  Change in appetite 2 2  Feeling bad or failure about yourself  0 0  Trouble concentrating 3 2  Moving slowly or fidgety/restless - 0  Suicidal thoughts 0 0  PHQ-9 Score 14 8   Results for orders placed or performed in visit on 03/18/20  POCT glucose (manual entry)  Result Value Ref Range   POC Glucose 126 (A) 70 - 99 mg/dl  POCT glycosylated hemoglobin (Hb A1C)  Result Value Ref Range   Hemoglobin A1C     HbA1c POC (<> result, manual entry)     HbA1c, POC (prediabetic range)     HbA1c, POC (controlled diabetic range) 7.5 (A) 0.0 - 7.0 %    ASSESSMENT AND PLAN:  1. Type 2 diabetes mellitus with diabetic polyneuropathy, without long-term current use of insulin (HCC) Not at goal. Discussed and encourage healthy eating habits.  Encouraged him to eat healthier snacks. Encouraged him to try to move more.  I have asked his sisters to get him out of the house at least about 2-3 times a week to go walking in the mall or other places. - POCT glucose (manual entry) - POCT glycosylated hemoglobin (Hb A1C) - Lipid panel - CBC  2. Essential hypertension I have asked sister to send me a few BP readings within the next 1-2 wks.  I aslo spoke with pt directly and requested that he allow sister to check BP since they do have a device at home.  3. Rectal bleeding Will get him back in with Dr. Ardis Hughs - Ambulatory referral to Gastroenterology  4. 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  Okay to take in evenings instead of mornings  5.  Dermatitis Of questionable etiology.  We will try him with his triamcinolone cream.  If no improvement will refer to dermatology. - triamcinolone (KENALOG) 0.1 %; Apply 1 application topically 2 (two) times daily.  Dispense: 30 g; Refill: 0  6. Neurocognitive disorder The patient is showing some improvement, I do not think she is at the level yet where he should be allowed to safely drive So I advised against this.   Patient was given the opportunity to ask questions.  Patient verbalized understanding of the plan and was able to repeat key elements of the plan.   Orders Placed This Encounter  Procedures  . Lipid panel  . CBC  . Ambulatory referral to Gastroenterology  . POCT glucose (manual entry)  . POCT glycosylated hemoglobin (Hb A1C)     Requested Prescriptions   Signed Prescriptions Disp Refills  . triamcinolone (KENALOG) 0.1 % 30 g 0    Sig: Apply 1 application topically  2 (two) times daily.    Return in about 4 months (around 07/17/2020).  Karle Plumber, MD, FACP

## 2020-03-18 NOTE — Patient Instructions (Addendum)
I have prescribed some triamcinolone cream for him to use twice a day for several days during flareup of rash.  If no improvement with this cream, please let me know so that we can refer to a dermatologist.  I would hold off on allowing him to drive for now.  His A1c today is 7.5 with goal being less than 7.  Continue glimepiride and have him work on cutting back on sweets snacks.  Please try to get him out to walk at least 2-3 times a week for about 30 minutes.

## 2020-03-19 ENCOUNTER — Other Ambulatory Visit: Payer: Self-pay | Admitting: Internal Medicine

## 2020-03-19 DIAGNOSIS — E785 Hyperlipidemia, unspecified: Secondary | ICD-10-CM

## 2020-03-19 LAB — CBC
Hematocrit: 44.4 % (ref 37.5–51.0)
Hemoglobin: 14.3 g/dL (ref 13.0–17.7)
MCH: 25.9 pg — ABNORMAL LOW (ref 26.6–33.0)
MCHC: 32.2 g/dL (ref 31.5–35.7)
MCV: 80 fL (ref 79–97)
Platelets: 265 10*3/uL (ref 150–450)
RBC: 5.53 x10E6/uL (ref 4.14–5.80)
RDW: 12.9 % (ref 11.6–15.4)
WBC: 6.7 10*3/uL (ref 3.4–10.8)

## 2020-03-19 LAB — LIPID PANEL
Chol/HDL Ratio: 4.1 ratio (ref 0.0–5.0)
Cholesterol, Total: 169 mg/dL (ref 100–199)
HDL: 41 mg/dL (ref >39)
LDL Chol Calc (NIH): 108 mg/dL — ABNORMAL HIGH (ref 0–99)
Triglycerides: 112 mg/dL (ref 0–149)
VLDL Cholesterol Cal: 20 mg/dL (ref 5–40)

## 2020-03-20 ENCOUNTER — Encounter: Payer: Self-pay | Admitting: Internal Medicine

## 2020-03-20 ENCOUNTER — Other Ambulatory Visit: Payer: Self-pay | Admitting: Neurology

## 2020-03-20 ENCOUNTER — Other Ambulatory Visit: Payer: Self-pay | Admitting: Internal Medicine

## 2020-03-20 DIAGNOSIS — E119 Type 2 diabetes mellitus without complications: Secondary | ICD-10-CM

## 2020-03-20 NOTE — Telephone Encounter (Signed)
Requested Prescriptions  Pending Prescriptions Disp Refills  . glimepiride (AMARYL) 2 MG tablet [Pharmacy Med Name: GLIMEPIRIDE 2 MG TABLET] 90 tablet 0    Sig: TAKE 1 TABLET BY MOUTH DAILY BEFORE BREAKFAST.     Endocrinology:  Diabetes - Sulfonylureas Passed - 03/20/2020  1:55 PM      Passed - HBA1C is between 0 and 7.9 and within 180 days    HbA1c, POC (controlled diabetic range)  Date Value Ref Range Status  03/18/2020 7.5 (A) 0.0 - 7.0 % Final         Passed - Valid encounter within last 6 months    Recent Outpatient Visits          2 days ago Type 2 diabetes mellitus with diabetic polyneuropathy, without long-term current use of insulin ([place])   [place], MD   2 months ago Need for influenza vaccination   [place], Jarome Matin, RPH-CPP   2 months ago Rectal bleeding   [place], MD   5 months ago Essential hypertension   [place], Annie Main L, RPH-CPP   6 months ago Elevated blood pressure reading   [place], RPH-CPP

## 2020-03-21 ENCOUNTER — Telehealth: Payer: Self-pay | Admitting: Internal Medicine

## 2020-03-21 NOTE — Telephone Encounter (Signed)
Patient's sister [name] called and says she picked up his medication from the pharmacy and was told the dosage was changed, so she's calling to confirm. Advised of lab results noted by Dr. Wynetta Emery on 03/19/20, sister verbalized understanding.   Your LDL cholesterol is mildly elevated but improved compared to 10 months ago. The goal is to be less than 70. Your recent count is 108. 10 months ago you were 167. Try to continue healthy eating habits. I recommend increasing the atorvastatin from 10 mg daily to 20 mg daily. I will update the prescription

## 2020-03-21 NOTE — Telephone Encounter (Signed)
[name] patient sister picked up LIPITOR from the pharmacy and noticed the  increase from 10 MG tablet to 20 MG and would like to discuss, sister states she was unaware.

## 2020-03-21 NOTE — Telephone Encounter (Signed)
Patient's sister called, no answer. Left message to return call to office. See notes from Dr. Wynetta Emery on 03/19/20 for labs drawn on 03/18/20.

## 2020-03-25 ENCOUNTER — Telehealth: Payer: Self-pay | Admitting: Internal Medicine

## 2020-03-25 DIAGNOSIS — R413 Other amnesia: Secondary | ICD-10-CM

## 2020-03-25 DIAGNOSIS — R419 Unspecified symptoms and signs involving cognitive functions and awareness: Secondary | ICD-10-CM

## 2020-03-25 NOTE — Telephone Encounter (Signed)
Copied from [place] [phone]. Topic: General - Other >> Mar 25, 2020 11:31 AM Oneta Rack wrote: Caller name:Rebecca Stone  Relation to pt: Speech Pathologist [facility]  Call back number: [phone]    Reason for call: Caller wanted to inform PCP last visit with patient will be 04/07/2020. Recommending PCP place orders for outpatient speech therapy and outpatient physical therapy. Patient will also need assistance with transportation to and from appointments. Please follow up with caller to inform message was received.   Please follow up.

## 2020-03-25 NOTE — Telephone Encounter (Signed)
Will forward to pcp

## 2020-03-28 NOTE — Telephone Encounter (Signed)
Phone call return to [employer] on 03/25/2020. She is been doing home speech therapy with the patient. States that patient has progressed well but she feels he would now benefit from outpatient speech therapy. Also stating that patient may benefit from outpatient physical therapy. That I saw patient recently and there was no indication to me that he needed additional physical therapy. He completed home physical therapy several weeks ago. I told her that she can have a physical therapist who did home physical therapy with him contact me or send me a message if he or she feels that patient needs additional physical therapy. She expressed understanding. In regards to transportation issues, I told her that our clinic does not provide transportation to and from our facility. Patient and his sisters can apply for [facility].

## 2020-04-05 ENCOUNTER — Telehealth: Payer: Self-pay | Admitting: Internal Medicine

## 2020-04-05 NOTE — Telephone Encounter (Signed)
Wells Guiles SP with [facility] is calling and would like verbal order for 1x1 and then 2x4 to assistant pt to be  ready to transition to outpatient speech therapy

## 2020-04-06 ENCOUNTER — Other Ambulatory Visit: Payer: Medicaid Other

## 2020-04-06 ENCOUNTER — Other Ambulatory Visit: Payer: Self-pay

## 2020-04-06 ENCOUNTER — Ambulatory Visit (INDEPENDENT_AMBULATORY_CARE_PROVIDER_SITE_OTHER): Payer: Medicaid Other | Admitting: Neurology

## 2020-04-06 ENCOUNTER — Encounter: Payer: Self-pay | Admitting: Neurology

## 2020-04-06 VITALS — BP 114/79 | HR 74 | Resp 20 | Ht 70.0 in | Wt 169.0 lb

## 2020-04-06 DIAGNOSIS — G40909 Epilepsy, unspecified, not intractable, without status epilepticus: Secondary | ICD-10-CM

## 2020-04-06 DIAGNOSIS — F0281 Dementia in other diseases classified elsewhere with behavioral disturbance: Secondary | ICD-10-CM

## 2020-04-06 DIAGNOSIS — R569 Unspecified convulsions: Secondary | ICD-10-CM

## 2020-04-06 DIAGNOSIS — F02818 Dementia in other diseases classified elsewhere, unspecified severity, with other behavioral disturbance: Secondary | ICD-10-CM

## 2020-04-06 MED ORDER — LEVETIRACETAM 500 MG PO TABS
ORAL_TABLET | ORAL | 3 refills | Status: DC
Start: 2020-04-06 — End: 2021-02-09

## 2020-04-06 MED ORDER — ESCITALOPRAM OXALATE 20 MG PO TABS: ORAL_TABLET | ORAL | 3 refills | Status: DC

## 2020-04-06 NOTE — Progress Notes (Signed)
NEUROLOGY FOLLOW UP OFFICE NOTE  [name] [id number] 1971/01/19  HISTORY OF PRESENT ILLNESS: I had the pleasure of seeing [name] in follow-up in the neurology clinic on 04/06/2020.  The patient was last seen 4 months ago and is accompanied by his sister [name] who helps supplement the history today.  Records and images were personally reviewed where available. On his last visit, Levetiracetam dose was reduced to 265m in AM, 5054min PM due to sleepiness. He was also started on Lexapro 10110maily. Although eye contact is still reduced on today's visit, he is more interactive/talkative compared to prior visits. [name] any seizures however they note an episode yesterday where his left arm started shaking, he was able to respond. NO focal weakness after, he sometimes notices shaking at night and just goes to sleep on his left side. He is less sleepy on current medication regimen but states he has nothing to do during the day because he stays home. He has been doing speech therapy and still does not remember the therapist's name even if she has been coming for months. Sometimes he surprises his family with what he remembers, then a few minutes later does not recall prior conversations. He is independent with dressing and bathing. He reports good sleep at night, Dalton Dashates he is not active during the day. Mood "comes and goes," most of the time god but then Dalton Dashates "he really affects us.[place] is not sure if he is hallucinating but he is always seeing mice. He reported mice going into Jay's room or when he takes the dog out, he saw a black snake in the front yard and that his dog was playing with it. This seems to occur daily for the past few months. He states he sometimes feels confused but some days feels good. Dalton Dashnages his medications. She states his meal choices are "atrocious," he would only eat cheese sandwiches. He has rare headaches and dizziness when standing. He has some tingling on the  left arm and reports some difficulty raising it over his head. They are looking into outpatient physical therapy.    History on Initial Assessment 06/16/2019: This is a 48 86ar old right-handed man with a history of hyperlipidemia, diabetes, presenting for evaluation of seizure and cognitive changes. His sister [name] present during the visit to provide additional information due to continued cognitive changes. He was in his usual state of health until the week of Christmas 2020 when he had a toothache. Dalton Dashports he has very bad teeth, he took Tylenol, then told her he was not feeling good on 12/7. He was talking fine, recognizing his sister, he was confused and out of it, so they brought him to the ER. [name] marijuana use, no alcohol or other illicit drugs. His BP was 156/104, HR 112. He was found to be in renal failure with creatinine of 10.38, AST 22, ALT 156. UDS positive for THC. MRI brain without contrast done 04/07/19 showed abnormal signal in the medial temporal lobe/hippocampus and posteromedial left thalamus, mild associated parenchymal swelling, chronic blood products along the right parietal lobe. He had an 18-hour EEG showing continuous rhythmic 2-3 Hz delta activity in the right temporoparietal region, at times sharply contoured. There was also slowing on the left hemisphere, and intermittent rhythmic generalized delta slowing. During his hospital stay, he had a generalized tonic-clonic seizure on 04/10/19, then another with note of left gaze deviation, left upper extremity extended, unresponsive. EEG showed diffuse  slowing and triphasic waves. Repeat EEG showed generalized slowing, triphasic waves. He had a lumbar puncture with WBC 3, protein 53, glucose 62. Negative HSV, VZV, ACE. Negative anti-Jo, dsDNA, SS-A, SS-B, SCL 70, anti-SM Ab. Repeat MRI brain done 04/15/19 showed new changes with fairly extensive subcortical greater than cortical signal abnormality in the bilateral frontoparietal  lboes, occipital lobes, and posterior temporal lobes. Patchy cortical restricted diffuse suspicious for acute/early subacute infarcts. There was also new signal change in the bilateral cerebellum and patchy restricted diffusion suspicious for acute/early subacute infarcts. 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 He received 5 days of IV Solu-Medrol. It was noted that his BP on admission was 156/104, as high as 219/98 on 1/4. EEG on 12/29 showed diffuse delta slowing in the right temporoparietal region, slowing on the left hemisphere.   He has a prior history of seizures, his sister reports a few seizures while he was teething. In 2006/2007 he had a couple of seizure where his eyes rolled back with stiffening and shaking. He was evaluated by Dr. Gaynell Face in 2007. At that time, he woke up dizzy with rotatory nystagmus, headache, followed by left focal motor seizure. LP at that time RBCs 11,320, likely a traumatic tap, however due to sudden onset headache and possible seizure, catheter angiogram was done, which was normal. EEG in 2007 reported occasional sharp transients in the left frontotemporal region. MRI brain no acute changes.   His sister reports that "his memory just went." He did not recognize her when she visited him in the hospital. Physically he is getting better, his sister makes sure he eats regularly. Cognitively he has not improved, they need to repeat and reiterate themselves to him. No staring/unresponsiveness, but he has delayed responses, like he has to overprocess information. He is able to bathe and dress himself, but she has to remind him to shower because he thinks he just had one. Most of the time he is calm, he occasionally gets very agitated for no reason, then calms down. No paranoia or hallucinations. When he initially came home, he did not remember he had a dog and kept calling him "dog," so afraid of him. It took several weeks to be  comfortable around him. She reports that he was not taking any medications prior to hospital admission. He used to smoke a lot of weed and "swears up and down that he had not used in years," but [name] says he used it daily. No alcohol use. A few days after hospital discharge, his family found him on the floor, he was hypotensive and had soiled his clothes, then was standing in a pool of blood. He had acute GI bleed secondary to PUD.   He feels his memory is "good, bad sometimes." He has some dizziness, drowsiness, and fatigue. Balance is bad in the mornings. He has some tingling in his legs. He has occasional headaches in the back of his head with throbbing, sensitivity to lights/sounds. No nausea/vomiting. Vision is sometimes blurred. He has occasional neck pain. No bowel/bladder dysfunction. His sister reports his legs are twitching constantly, decreasing when he lifts his legs up. [name] has not witnessed any seizures, he is taking Levetiracetam 557m BID.  Diagnostic Data:  Repeat EEG done 09/2019 which was within normal limits. 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 Neuropsychological evaluation in 11/2019 showed significant cognitive impairment with extremely low performance on measures of attention, processing speed, memory, and weak scores on measures of executive functioning. His performance was better on measures of visuospatial/constructional abilities and language function. He screened positive for the presence of depression. Profile suggestive of Major Neurocognitive Disorder, etiology likely due to multiple etiologies, including sequelae from the imaging changes. Although there were only small areas of progression to infarction on MRI, there may be additional  damage/dysfunction not well visualized. He had a prolonged episode of mental status and uremia. Medication side effects and depression may also be contributory.    PAST MEDICAL HISTORY: Past Medical History:  Diagnosis Date  . Cataract cortical, senile, bilateral   . Diabetes mellitus, type 2 ([place])   . Duodenal ulcer   . Fatty liver   . Ocular hypertension, bilateral   . Peptic ulcer disease   . Psoriasis   . Seizures ([place])     MEDICATIONS: Current Outpatient Medications on File Prior to Visit  Medication Sig Dispense Refill  . Accu-Chek FastClix Lancets MISC UAD 100 each 0  . acetaminophen (TYLENOL) 500 MG tablet Take 1 tablet (500 mg total) by mouth every 6 (six) hours as needed for mild pain, moderate pain, fever or headache.    Marland Kitchen atorvastatin (LIPITOR) 20 MG tablet Take 1 tablet (20 mg total) by mouth daily. 90 tablet 1  . escitalopram (LEXAPRO) 10 MG tablet TAKE 1 TABLET BY MOUTH EVERY DAY 30 tablet 0  . fexofenadine (ALLEGRA) 180 MG tablet Take 180 mg by mouth daily as needed for allergies or rhinitis.    Marland Kitchen glimepiride (AMARYL) 2 MG tablet TAKE 1 TABLET BY MOUTH DAILY BEFORE BREAKFAST. 90 tablet 0  . glucose blood (ACCU-CHEK GUIDE) test strip Use as instructed 100 each 12  . levETIRAcetam (KEPPRA) 500 MG tablet Take 1/2 tab in AM, 1 tab in PM (Patient taking differently: 1/2 tab in am and 1 tab in PM) 135 tablet 3  . pantoprazole (PROTONIX) 40 MG tablet TAKE 1 TABLET BY MOUTH TWICE A DAY 180 tablet 2  . triamcinolone (KENALOG) 0.1 % Apply 1 application topically 2 (two) times daily. 30 g 0  . Blood Glucose Monitoring Suppl (ACCU-CHEK GUIDE ME) w/Device KIT UAD (Patient not taking: Reported on 04/06/2020) 1 kit 0   No current facility-administered medications on file prior to visit.    ALLERGIES: Allergies  Allergen Reactions  . Penicillins Hives and Other (See Comments)    Childhood allergy  Did it involve swelling of the face/tongue/throat, SOB, or low BP? Unknown Did  it involve sudden or severe rash/hives, skin peeling, or any reaction on the inside of your mouth or nose? Unknown Did you need to seek medical attention at a hospital or doctor's office? Unknown When did it last happen?Pt was a child If all above answers are "NO", may proceed with cephalosporin use. Sister states he just breaks out in hives unsure about other reactions.      FAMILY HISTORY: Family History  Problem Relation Age of Onset  . Diabetes Mother   . Diabetes Sister   . Diabetes Father   . Esophageal cancer Neg Hx   . Stomach cancer Neg Hx   . Colon cancer Neg Hx   . Pancreatic cancer Neg Hx     SOCIAL HISTORY: Social History   Socioeconomic History  . Marital status: Single    Spouse name: Not on file  . Number of  children: Not on file  . Years of education: Not on file  . Highest education level: Not on file  Occupational History  . Not on file  Tobacco Use  . Smoking status: Never Smoker  . Smokeless tobacco: Never Used  Vaping Use  . Vaping Use: Never used  Substance and Sexual Activity  . Alcohol use: Not Currently  . Drug use: Not Currently    Comment: 5 times per month (per 04/21/19 hospital note)  . Sexual activity: Not on file  Other Topics Concern  . Not on file  Social History Narrative   Left handed   Drinks caffeine   Single story home   Social Determinants of Health   Financial Resource Strain: Not on file  Food Insecurity: Not on file  Transportation Needs: Not on file  Physical Activity: Not on file  Stress: Not on file  Social Connections: Not on file  Intimate Partner Violence: Not on file     PHYSICAL EXAM: Vitals:   04/06/20 1349  BP: 114/79  Pulse: 74  Resp: 20  SpO2: 96%   General: No acute distress, flat affect but more eye contact and conversation compared to prior visits Head:  Normocephalic/atraumatic Skin/Extremities: No rash, no edema Neurological Exam: alert and oriented to person, place,  month/year/season. No aphasia or dysarthria. Fund of knowledge is appropriate.  Recent and remote memory are impaired. Attention and concentration are reduced. MMSE 23/30. MMSE - Mini Mental State Exam 04/06/2020  Orientation to time 3  Orientation to Place 4  Registration 3  Attention/ Calculation 3  Recall 1  Language- name 2 objects 2  Language- repeat 1  Language- follow 3 step command 3  Language- read & follow direction 1  Write a sentence 1  Copy design 1  Total score 23   Cranial nerves: Pupils equal, round. Extraocular movements intact with no nystagmus. Visual fields full.  No facial asymmetry.  Motor: Bulk and tone normal, muscle strength 5/5 throughout with no pronator drift.   Finger to nose testing intact.  Gait slow and cautious, no ataxia   IMPRESSION: This is a 49 yo RH man with a history of hyperlipidemia, diabetes, remote seizures, in his usual state of health until the end of December 2020. He was found to be in renal failure, initial MRI brain showed changes in the left medial temporal lobe/hippocampus and left thalamus. During his hospital stay, he had a GTC with left gaze deviation. Repeat MRI brain a few days later showed extensive signal abnormality in the subcortical bilateral frontoparietal lobes, occipital lobes, and posterior temporal lobes, with restricted diffusion suspicious for acute/early subacute infarcts. 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 Repeat EEG within normal limits. 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 He continues to have cognitive issues and needs supervision with medications. MMSE today 23/30. [facility]  autoimmune panel (serum) will be ordered today. We discussed increasing Lexapro to 29m daily. They are reporting episodes of left arm shaking, a 48-hour EEG will be ordered to classify symptoms, continue Levetiracetam 2548min AM, 50047mn PM for now. Continue speech therapy and physical therapy as planned. Follow-up in 3-4 months, they know to call for any changes.   Thank you for allowing  me to participate in his care.  Please do not hesitate to call for any questions or concerns.   Ellouise Newer, M.D.   CC: Dr. Wynetta Emery

## 2020-04-06 NOTE — Telephone Encounter (Signed)
Returned Lobbyist and gave verbal orders

## 2020-04-06 NOTE — Patient Instructions (Addendum)
1. Schedule 48-hour EEG  2. Bloodwork for autoimmune panel  3. Increase Lexapro to 20mg  every night  4. Continue with Keppra 500mg : take 1/2 tab in AM, 1 tab in PM  5. Continue with speech therapy, proceed with physical therapy as planned  6. Follow-up in 3-4 months, call for any changes   Seizure Precautions: 1. If medication has been prescribed for you to prevent seizures, take it exactly as directed.  Do not stop taking the medicine without talking to your doctor first, even if you have not had a seizure in a long time.   2. Avoid activities in which a seizure would cause danger to yourself or to others.  Don't operate dangerous machinery, swim alone, or climb in high or dangerous places, such as on ladders, roofs, or girders.  Do not drive unless your doctor says you may.  3. If you have any warning that you may have a seizure, lay down in a safe place where you can't hurt yourself.    4.  No driving for 6 months from last seizure, as per [facility].   Please refer to the following link on the [place] website for more information: http://www.epilepsyfoundation.org/answerplace/Social/driving/drivingu.cfm   5.  Maintain good sleep hygiene. Avoid alcohol.  6.  Contact your doctor if you have any problems that may be related to the medicine you are taking.  7.  Call 911 and bring the patient back to the ED if:        A.  The seizure lasts longer than 5 minutes.       B.  The patient doesn't awaken shortly after the seizure  C.  The patient has new problems such as difficulty seeing, speaking or moving  D.  The patient was injured during the seizure  E.  The patient has a temperature over 102 F (39C)  F.  The patient vomited and now is having trouble breathing

## 2020-04-21 LAB — MAYO MISC ORDER: PRICE:: 2599.3

## 2020-04-25 ENCOUNTER — Other Ambulatory Visit: Payer: Medicaid Other

## 2020-04-27 ENCOUNTER — Ambulatory Visit (INDEPENDENT_AMBULATORY_CARE_PROVIDER_SITE_OTHER): Payer: Medicaid Other | Admitting: Neurology

## 2020-04-27 ENCOUNTER — Other Ambulatory Visit: Payer: Self-pay

## 2020-04-27 DIAGNOSIS — G40909 Epilepsy, unspecified, not intractable, without status epilepticus: Secondary | ICD-10-CM

## 2020-04-27 DIAGNOSIS — R569 Unspecified convulsions: Secondary | ICD-10-CM | POA: Diagnosis not present

## 2020-05-06 ENCOUNTER — Telehealth: Payer: Self-pay | Admitting: Internal Medicine

## 2020-05-06 NOTE — Telephone Encounter (Signed)
Copied from [place] [phone]. Topic: General - Inquiry >> May 05, 2020  5:35 PM Gillis Ends D wrote: Reason for CRM: Stormy Card called from [place] and wanted Dr. Wynetta Emery to know that the patient is being discharged from [facility] in the home. He is no longer home bound. He is doing a great job but will follow up with speech therapy out since no longer home bound. Stormy Card can be reached at [phone] if you have any questions. Please advise

## 2020-05-06 NOTE — Telephone Encounter (Signed)
FYI

## 2020-05-09 ENCOUNTER — Telehealth: Payer: Self-pay

## 2020-05-09 NOTE — Telephone Encounter (Signed)
Spoke with pt sister [name] and informed her that pt EEG was normal, no seizure activity seen. It looks like he did not have the arm shaking during the EEG though,  Also, the bloodwork sent to [facility] clinic was also normal.  While on the phone asked pt sister if Mr Sipos was having the arm shaking at home?  She stated that he was but not a lot she said that she notices that he would hold his hand at times. And that she did not see his arm shaking pt would not tell her about it.

## 2020-05-09 NOTE — Telephone Encounter (Signed)
-----   Message from Cameron Sprang, MD sent at 05/09/2020  1:08 PM EST ----- Pls let his sister [name] know that the EEG was normal, no seizure activity seen. It looks like he did not have the arm shaking during the EEG though, is he still having a lot of the arm shaking? Also, the bloodwork sent to [facility] clinic was also normal, thanks

## 2020-05-09 NOTE — Procedures (Signed)
ELECTROENCEPHALOGRAM REPORT  Dates of Recording: 04/27/2020 8:03AM to 04/29/2020 5:26AM Patient's Name: [name] MRN: [id number] Date of Birth: 08-16-1970  Referring Provider: Dr. Ellouise Newer  Procedure: 45-hour ambulatory video EEG  History: This is a 50 year old man with new onset seizure, abnormal brain MRI concerning for either PRES or encephalitis, with episodes of left arm shaking. EEG for classification.  Medications:  Keppra Lipitor Lexapro Protonix  Technical Summary: This is a 45-hour multichannel digital video EEG recording measured by the international 10-20 system with electrodes applied with paste and impedances below 5000 ohms performed as portable with EKG monitoring.  The digital EEG was referentially recorded, reformatted, and digitally filtered in a variety of bipolar and referential montages for optimal display.    DESCRIPTION OF RECORDING: During maximal wakefulness, the background activity consisted of a symmetric 9 Hz posterior dominant rhythm which was reactive to eye opening.  There were no epileptiform discharges or focal slowing seen in wakefulness.  During the recording, the patient progresses through wakefulness, drowsiness, and Stage 2 sleep.  Again, there were no epileptiform discharges seen.  Events: There were no push button events.   There were no electrographic seizures seen.  EKG lead was unremarkable.  IMPRESSION: This 45-hour ambulatory video EEG study is normal.    CLINICAL CORRELATION: A normal EEG does not exclude a clinical diagnosis of epilepsy. Typical events were not captured. If further clinical questions remain, inpatient video EEG monitoring may be helpful.   Ellouise Newer, M.D.

## 2020-05-09 NOTE — Telephone Encounter (Signed)
-----   Message from Cameron Sprang, MD sent at 05/09/2020  1:08 PM EST ----- Pls let his sister [name] know that the EEG was normal, no seizure activity seen. It looks like he did not have the arm shaking during the EEG though, is he still having a lot of the arm shaking? Also, the bloodwork sent to [facility] clinic was also normal, thanks

## 2020-05-09 NOTE — Telephone Encounter (Signed)
Pt sister called to go over results no answer left a voice mail to call the office back

## 2020-05-20 ENCOUNTER — Other Ambulatory Visit: Payer: Self-pay

## 2020-05-20 ENCOUNTER — Ambulatory Visit: Payer: Medicaid Other | Attending: Internal Medicine

## 2020-05-20 DIAGNOSIS — R41841 Cognitive communication deficit: Secondary | ICD-10-CM | POA: Insufficient documentation

## 2020-05-20 NOTE — Therapy (Signed)
[facility] [address] [place], [place], [address] Phone: [phone]   Fax:  [phone]  Speech Language Pathology Evaluation  Patient Details  Name: [name] MRN: [id number] Date of Birth: 13-Jan-1971 Referring Provider (SLP): Karle Plumber, MD   Encounter Date: 05/20/2020   End of Session - 05/20/20 1715    Visit Number 1    Number of Visits 15    Date for SLP Re-Evaluation 08/18/20    Authorization Type Medicaid ([employer])    Authorization - Visit Number 0    Authorization - Number of Visits 14    SLP Start Time 1450    SLP Stop Time  [id number]    SLP Time Calculation (min) 43 min    Activity Tolerance Patient tolerated treatment well           Past Medical History:  Diagnosis Date  . Cataract cortical, senile, bilateral   . Diabetes mellitus, type 2 ([place])   . Duodenal ulcer   . Fatty liver   . Ocular hypertension, bilateral   . Peptic ulcer disease   . Psoriasis   . Seizures ([place])     Past Surgical History:  Procedure Laterality Date  . BIOPSY  04/17/2019   Procedure: BIOPSY;  Surgeon: Irene Shipper, MD;  Location: [facility] ENDOSCOPY;  Service: Endoscopy;;  . ESOPHAGOGASTRODUODENOSCOPY Left 04/21/2019   Procedure: ESOPHAGOGASTRODUODENOSCOPY (EGD);  Surgeon: Jerene Bears, MD;  Location: [facility] ENDOSCOPY;  Service: Gastroenterology;  Laterality: Left;  . ESOPHAGOGASTRODUODENOSCOPY (EGD) WITH PROPOFOL N/A 04/17/2019   Procedure: ESOPHAGOGASTRODUODENOSCOPY (EGD) WITH PROPOFOL;  Surgeon: Irene Shipper, MD;  Location: [facility] ENDOSCOPY;  Service: Endoscopy;  Laterality: N/A;  . ESOPHAGOGASTRODUODENOSCOPY (EGD) WITH PROPOFOL N/A 04/23/2019   Procedure: ESOPHAGOGASTRODUODENOSCOPY (EGD) WITH PROPOFOL;  Surgeon: Jerene Bears, MD;  Location: [facility] ENDOSCOPY;  Service: Gastroenterology;  Laterality: N/A;  . HEMOSTASIS CLIP PLACEMENT  04/17/2019   Procedure: HEMOSTASIS CLIP PLACEMENT;  Surgeon: Irene Shipper, MD;  Location: [facility]  ENDOSCOPY;  Service: Endoscopy;;  . HEMOSTASIS CLIP PLACEMENT  04/21/2019   Procedure: HEMOSTASIS CLIP PLACEMENT;  Surgeon: Jerene Bears, MD;  Location: [facility] ENDOSCOPY;  Service: Gastroenterology;;  . IR FLUORO GUIDE CV LINE RIGHT  04/10/2019  . IR REMOVAL TUN CV CATH W/O FL  04/17/2019  . IR US GUIDE VASC ACCESS RIGHT  04/10/2019  . SUBMUCOSAL INJECTION  04/17/2019   Procedure: SUBMUCOSAL INJECTION;  Surgeon: Irene Shipper, MD;  Location: [facility] ENDOSCOPY;  Service: Endoscopy;;    There were no vitals filed for this visit.       SLP Evaluation [facility] - 05/20/20 1417      SLP Visit Information   SLP Received On 05/20/20    Referring Provider (SLP) Karle Plumber, MD    Onset Date cognitive change - March 2021    Medical Diagnosis Neurocognitive changes      Subjective   Patient/Family Stated Goal It appears pt desires improvement of cogntion closer to his PLOF.      General Information   HPI In late 2020-early 2021 pt with new onset notable cognitive changes. Brain imaging from this time reveals a few possibilites for these changes. Neuropsych from August 2021 stated significant cognitive impairments with extremely low scores with attention, processing speed, memory with weak scores with skills in executive function. Better performance on measures of visuospatial/constructional abilites and language. MMSE was administered on 04-06-20 at neurologist office Delice Lesch) and pt scored 23/30. EEG 04-27-20 was WNL. Pt had HHST until 05-06-20.  Behavioral/Cognition At times pt with limited eye contact with SLP.      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   Orientation Level Disoriented to;Time    Current Attention Level Sustained   sister reports pt loses attention after <60 seconds for some tasks   Attention Comments Sister states she has to use "constant" redirection back to task with pt, even with tasks like emptying the dish drainer. Sister gave pt task to put caulk on windows and pt did ont recall being told to do so, nor taking the caulk gun and sealant from sister earlier.    Memory Decreased short-term memory   decreased long term memory   Memory Comments SLP told story at slower than the regular rate on story retell subtest. Pt recalled 3/21 items, no correct yes/no responses. Likely the components of decr'd processing speed and decr'd sustained attention depressed pt's performance this subtest.    Awareness Intellectual;Emergent   pt has very basic intellectual awareness - indicated he would not have told SLP the date if he hadn't had crossed-off calendar as cue   Awareness Comments intellectual and emergent awareness defificit likely tied at some point to attention deficits. On clock drawing pt numbers were all in space from 12-9, and pt did not note to keep numbers at edge of circle. Hands originated in lower left quadrant. Pt was asked if he was done and stated yes. Later, only when SLP cued pt to focus on certain aspects of his clock was he able to tell SLP the errors he made.    Behaviors --   sister states pt has hallucinations regularly     Auditory Comprehension   Overall Auditory Comprehension Appears within functional limits for tasks assessed      Verbal Expression   Overall Verbal Expression Appears within functional limits for tasks assessed    Initiation Impaired   attention related   Other Verbal Expression Comments --   pt did not speak unless SLP spoke to him     Motor  Speech   Overall Motor Speech Appears within functional limits for tasks assessed                           SLP Education - 05/20/20 1712    Education Details use calendar in central location and cross of daily just prior to bed, pt learns best by procedural memory, pt's memory deficit has attention as a component but likely also true memory deficit is present, compensations are best way to improve memory at this time    Person(s) Educated Patient   sister   Methods Explanation;Demonstration    Comprehension Verbalized understanding;Need further instruction            SLP Short Term Goals - 05/20/20 1731      SLP SHORT TERM GOAL #1   Title complete a simple linguistic task for 5 minutes with occasional min a back  to task, in a quiet environment    Baseline tracking simple conversation <60 seconds    Time 3    Period Weeks   or 7 total sessions, for all STGs   Status New      SLP SHORT TERM GOAL #2   Title pt/family will report pt is using calendar to look for upcoming appointments, when cued to do so, between 3 sessions    Baseline not used currently    Time 3    Period Weeks    Status New      SLP SHORT TERM GOAL #3   Title pt/family will report pt is able to complete a simple task/chore at home with occasional min A back to task between 3 sessions    Baseline "constant' redirection    Time 3    Period Weeks    Status New      SLP SHORT TERM GOAL #4   Title pt will tell SLP of 2 of his cognitive deficits with modified independence (using a memory book/system)    Baseline one    Time 3    Period Weeks    Status New            SLP Long Term Goals - 05/20/20 1735      SLP LONG TERM GOAL #1   Title pt will correctly use a memory system for simple tasks (appointments, medication administration, etc) in two sessions    Baseline no system yet    Time 7    Period Weeks   or 15 total sessions, for all LTGs   Status New      SLP LONG TERM GOAL #2    Title pt will demonstrate knowledge he needs to use a memory system/notebook by going to the notebook at appropriate times in 4 sessions    Baseline no notebook/system yet    Time 7    Period Weeks    Status New      SLP LONG TERM GOAL #3   Title pt will complete simple linguistic/cognitive task >/= 5 minutes with rare redirection back to task in 3 sessions    Baseline <60 seconds    Time 7    Period Weeks    Status New            Plan - 05/20/20 1717    Clinical Impression Statement [name] is a pt presenting today with cognitive communication deficits in the areas of attention, awareness, and memory. Pt is currenlty not able to live on his own. Sister reports pt cannot complete a simple task such as putting away folded clothes into the correct drawers in his room and requires "constant" supervision to complete a simple task such as this. Today, pt was pleasant but did not speak unless SLP spoke to him or sister asked pt a question. He was asked what home health SLP did with him and he stated "Memory stuff. She read me stories and I had to tell her what happened." At this time, basis for cognitive decline could be numerous incidents documented in medical chart: MRI brain without contrast done 04/07/19 showed abnormal signal in the medial temporal lobe/hippocampus and posteromedial left thalamus, mild associated parenchymal swelling, chronic blood products along the right parietal lobe. During his hospital stay, he had a generalized tonic-clonic seizure on 04/10/19, then another with note of left gaze deviation, left upper extremity extended, unresponsive. Repeat MRI brain done 04/15/19 showed new changes with fairly extensive subcortical greater  than cortical signal abnormality in the bilateral frontoparietal lboes, occipital lobes, and posterior temporal lobes. Patchy cortical restricted diffuse suspicious for acute/early subacute infarcts. There was also new signal change in the bilateral  cerebellum and patchy restricted diffusion suspicious for acute/early subacute infarcts. 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 Pt would benefit from skilled ST to target awareness, attention, and train family and pt in memory compensastions to relieve caregiver burden as well as bring pt closer to PLOF.    Speech Therapy Frequency 2x / week    Duration --   7 weeks or 15 total visits   Treatment/Interventions Cueing hierarchy;Cognitive reorganization;Patient/family education;Internal/external aids;Compensatory strategies;SLP instruction and feedback;Functional tasks;Multimodal communcation approach    Potential to Achieve Goals Fair    Potential Considerations Severity of impairments;Ability to learn/carryover information    Consulted and Agree with Plan of Care Patient;Family member/caregiver    Family Member Consulted sister [name]           Patient will benefit from skilled therapeutic intervention in order to improve the following deficits and impairments:   Cognitive communication deficit - Plan: SLP plan of care cert/re-cert    [facility] Authorization   Choose one: Neuro Rehabilitative  Standardized Assessment or Functional Outcome Tool: Portions of Cognitive Linguistic Quick Test, and pt's sister report  Score or Percent Disability: "Significant cognitive impairment" from neuropsych testing August 2021   Problem List Patient Active Problem List   Diagnosis Date Noted  . Neurocognitive disorder 03/18/2020  . Elevated blood pressure reading 08/24/2019  . Memory changes 05/25/2019  . Non-traumatic rhabdomyolysis   . Acute GI bleeding 04/21/2019  . Diabetes mellitus type 2 in nonobese ([place]) 04/21/2019  . Seizures ([place]) 04/19/2019  . Acute blood loss anemia   . Duodenal ulcer hemorrhage   . Acute gastric ulcer without hemorrhage or perforation   . Acute renal failure (ARF) ([place]) 04/05/2019  . Acute metabolic encephalopathy  [id number]    [facility] ,MS, CCC-SLP  05/20/2020, 5:42 PM  [facility] [address] [place], [place], [address] Phone: [phone]   Fax:  [phone]  Name: [name] MRN: [id number] Date of Birth: 1970/07/18

## 2020-06-03 ENCOUNTER — Ambulatory Visit: Payer: Medicaid Other

## 2020-06-03 ENCOUNTER — Encounter: Payer: Self-pay | Admitting: Internal Medicine

## 2020-06-03 ENCOUNTER — Other Ambulatory Visit: Payer: Self-pay

## 2020-06-03 ENCOUNTER — Ambulatory Visit (INDEPENDENT_AMBULATORY_CARE_PROVIDER_SITE_OTHER): Payer: Medicaid Other | Admitting: Internal Medicine

## 2020-06-03 VITALS — BP 108/80 | HR 96 | Ht 70.0 in | Wt 177.6 lb

## 2020-06-03 DIAGNOSIS — R41841 Cognitive communication deficit: Secondary | ICD-10-CM | POA: Diagnosis not present

## 2020-06-03 DIAGNOSIS — K648 Other hemorrhoids: Secondary | ICD-10-CM

## 2020-06-03 NOTE — Progress Notes (Signed)
HISTORY OF PRESENT ILLNESS:  [name] is a 50 y.o. male with a history of diabetes mellitus, seizure disorder, and bleeding duodenal ulcer disease for which he required hospitalization.  He presents today with his Sister [name] regarding rectal bleeding.  The history is somewhat in question but it appears that he has had low-volume bright red blood per rectum every other day, recently.  He denies rectal pain or abdominal pain.  No other complaints.  He continues on pantoprazole daily for history of complicated ulcer disease.  Patient did undergo both colonoscopy and upper endoscopy August 07, 2019.  Colonoscopy was entirely normal.  Small internal hemorrhoids visualized on retroflexion.  Endoscopy was normal with previous area of duodenal ulceration having healed.  He does state that his bowel habits tend to be firm a bit on the constipated side.  GI review of systems is otherwise negative.  Review of blood work from March 18, 2020 shows normal hemoglobin of 14.3.  He has completed his COVID vaccination series and booster  REVIEW OF SYSTEMS:  All non-GI ROS negative unless otherwise stated in the HPI except for confusion, depression  Past Medical History:  Diagnosis Date  . Cataract cortical, senile, bilateral   . Diabetes mellitus, type 2 ([place])   . Duodenal ulcer   . Fatty liver   . Ocular hypertension, bilateral   . Peptic ulcer disease   . Psoriasis   . Seizures ([place])     Past Surgical History:  Procedure Laterality Date  . BIOPSY  04/17/2019   Procedure: BIOPSY;  Surgeon: Irene Shipper, MD;  Location: [facility] ENDOSCOPY;  Service: Endoscopy;;  . ESOPHAGOGASTRODUODENOSCOPY Left 04/21/2019   Procedure: ESOPHAGOGASTRODUODENOSCOPY (EGD);  Surgeon: Jerene Bears, MD;  Location: [facility] ENDOSCOPY;  Service: Gastroenterology;  Laterality: Left;  . ESOPHAGOGASTRODUODENOSCOPY (EGD) WITH PROPOFOL N/A 04/17/2019   Procedure: ESOPHAGOGASTRODUODENOSCOPY (EGD) WITH PROPOFOL;  Surgeon: Irene Shipper, MD;   Location: [facility] ENDOSCOPY;  Service: Endoscopy;  Laterality: N/A;  . ESOPHAGOGASTRODUODENOSCOPY (EGD) WITH PROPOFOL N/A 04/23/2019   Procedure: ESOPHAGOGASTRODUODENOSCOPY (EGD) WITH PROPOFOL;  Surgeon: Jerene Bears, MD;  Location: [facility] ENDOSCOPY;  Service: Gastroenterology;  Laterality: N/A;  . HEMOSTASIS CLIP PLACEMENT  04/17/2019   Procedure: HEMOSTASIS CLIP PLACEMENT;  Surgeon: Irene Shipper, MD;  Location: [facility] ENDOSCOPY;  Service: Endoscopy;;  . HEMOSTASIS CLIP PLACEMENT  04/21/2019   Procedure: HEMOSTASIS CLIP PLACEMENT;  Surgeon: Jerene Bears, MD;  Location: [facility] ENDOSCOPY;  Service: Gastroenterology;;  . IR FLUORO GUIDE CV LINE RIGHT  04/10/2019  . IR REMOVAL TUN CV CATH W/O FL  04/17/2019  . IR US GUIDE VASC ACCESS RIGHT  04/10/2019  . SUBMUCOSAL INJECTION  04/17/2019   Procedure: SUBMUCOSAL INJECTION;  Surgeon: Irene Shipper, MD;  Location: [facility] ENDOSCOPY;  Service: Endoscopy;;    Social History [name]  reports that he has never smoked. He has never used smokeless tobacco. He reports previous alcohol use. He reports previous drug use.  family history includes Diabetes in his father, mother, and sister.  Allergies  Allergen Reactions  . Penicillins Hives and Other (See Comments)    Childhood allergy  Did it involve swelling of the face/tongue/throat, SOB, or low BP? Unknown Did it involve sudden or severe rash/hives, skin peeling, or any reaction on the inside of your mouth or nose? Unknown Did you need to seek medical attention at a hospital or doctor's office? Unknown When did it last happen?Pt was a child If all above answers are "NO", may proceed with cephalosporin  use. Sister states he just breaks out in hives unsure about other reactions.         PHYSICAL EXAMINATION:  Vital signs: BP 108/80   Pulse 96   Ht '5\' 10"'$  (1.778 m)   Wt 177 lb 9.6 oz (80.6 kg)   BMI 25.48 kg/m   Constitutional: generally well-appearing, no acute distress Psychiatric: alert and oriented x3,  cooperative Eyes: extraocular movements intact, anicteric, conjunctiva pink Mouth: oral pharynx moist, no lesions Neck: supple no lymphadenopathy Cardiovascular: heart regular rate and rhythm, no murmur Lungs: clear to auscultation bilaterally Abdomen: soft, nontender, nondistended, no obvious ascites, no peritoneal signs, normal bowel sounds, no organomegaly Rectal: No external abnormalities.  Brown soft Hemoccult negative stool.  No tenderness.  Noninflamed internal hemorrhoids Extremities: no clubbing, cyanosis, or lower extremity edema bilaterally Skin: no lesions on visible extremities Neuro: No focal deficits.  Cranial nerves intact  ASSESSMENT:  1.  Minor rectal bleeding secondary to internal hemorrhoids 2.  Normal colonoscopy April 2021 3.  History of duodenal ulcer disease.  Followed to healing with endoscopy April 2021.  Continues on pantoprazole 4.  Tendency toward constipation  PLAN:  1.  Daily fiber supplementation with Metamucil 2 tablespoons daily 2.  Increase water consumption 3.  Stool softeners Colace 1 or 2 daily 4.  Continue pantoprazole daily 5.  Routine follow-up screening colonoscopy 2031.  Interval follow-up as needed A total time of 30 minutes was required preparing to see the patient, reviewing outside tests and records, obtaining comprehensive history, performing medically appropriate physical examination, counseling the patient and his sister regarding his GI issues, directing medical therapies as outlined, and documenting clinical information in the health record

## 2020-06-03 NOTE — Patient Instructions (Addendum)
  3-ring: Calendar Journal entries  Questions for MDs  Cross off days the last thing before bed  Schedule is important: Schedule a time (or time period) for "sibling dinner" where you sit down with [name] and [name] to eat, or just sit down with them if you don't want to eat Have the same thing happen after dinner and before dinner every night     Day Center or [place] via Medicaid (??) - Dr. Posey Pronto or Dr. Wynetta Emery

## 2020-06-03 NOTE — Therapy (Signed)
[facility] [address] [place], [place], [address] Phone: [phone]   Fax:  [phone]  Speech Language Pathology Treatment  Patient Details  Name: [name] MRN: [id number] Date of Birth: [birth year] Referring Provider (SLP): Karle Plumber, MD   Encounter Date: 06/03/2020   End of Session - 06/03/20 1557    Visit Number 2    Number of Visits 15    Date for SLP Re-Evaluation 08/18/20    Authorization Type Medicaid (wellcare)    Authorization - Visit Number 1    Authorization - Number of Visits 7    SLP Start Time [id number]    SLP Stop Time  [id number]    SLP Time Calculation (min) 48 min    Activity Tolerance Patient limited by lethargy;Other (comment)   limited by memory/attention deficit          Past Medical History:  Diagnosis Date  . Cataract cortical, senile, bilateral   . Diabetes mellitus, type 2 ([facility])   . Duodenal ulcer   . Fatty liver   . Ocular hypertension, bilateral   . Peptic ulcer disease   . Psoriasis   . Seizures ([place])     Past Surgical History:  Procedure Laterality Date  . BIOPSY  04/17/2019   Procedure: BIOPSY;  Surgeon: Irene Shipper, MD;  Location: [facility] ENDOSCOPY;  Service: Endoscopy;;  . ESOPHAGOGASTRODUODENOSCOPY Left 04/21/2019   Procedure: ESOPHAGOGASTRODUODENOSCOPY (EGD);  Surgeon: Jerene Bears, MD;  Location: [facility] ENDOSCOPY;  Service: Gastroenterology;  Laterality: Left;  . ESOPHAGOGASTRODUODENOSCOPY (EGD) WITH PROPOFOL N/A 04/17/2019   Procedure: ESOPHAGOGASTRODUODENOSCOPY (EGD) WITH PROPOFOL;  Surgeon: Irene Shipper, MD;  Location: [facility] ENDOSCOPY;  Service: Endoscopy;  Laterality: N/A;  . ESOPHAGOGASTRODUODENOSCOPY (EGD) WITH PROPOFOL N/A 04/23/2019   Procedure: ESOPHAGOGASTRODUODENOSCOPY (EGD) WITH PROPOFOL;  Surgeon: Jerene Bears, MD;  Location: [facility] ENDOSCOPY;  Service: Gastroenterology;  Laterality: N/A;  . HEMOSTASIS CLIP PLACEMENT  04/17/2019   Procedure: HEMOSTASIS CLIP PLACEMENT;   Surgeon: Irene Shipper, MD;  Location: [facility] ENDOSCOPY;  Service: Endoscopy;;  . HEMOSTASIS CLIP PLACEMENT  04/21/2019   Procedure: HEMOSTASIS CLIP PLACEMENT;  Surgeon: Jerene Bears, MD;  Location: [facility] ENDOSCOPY;  Service: Gastroenterology;;  . IR FLUORO GUIDE CV LINE RIGHT  04/10/2019  . IR REMOVAL TUN CV CATH W/O FL  04/17/2019  . IR US GUIDE VASC ACCESS RIGHT  04/10/2019  . SUBMUCOSAL INJECTION  04/17/2019   Procedure: SUBMUCOSAL INJECTION;  Surgeon: Irene Shipper, MD;  Location: [facility] ENDOSCOPY;  Service: Endoscopy;;    There were no vitals filed for this visit.   Subjective Assessment - 06/03/20 1458    Subjective With cues pt is now marking off days on calendar, pt's drawers in bedroom are labeled. "I still have to look at the stickies."    Currently in Pain? No/denies                 ADULT SLP TREATMENT - 06/03/20 1549      General Information   Behavior/Cognition Cooperative;Confused;Lethargic;Requires cueing   flat affect; little eye contact with SLP     Treatment Provided   Treatment provided Cognitive-Linquistic      Cognitive-Linquistic Treatment   Treatment focused on Patient/family/caregiver education;Cognition    Skilled Treatment See "pt instructions" for majority of topics covered in session today. Full session was spent on pt/family education about how to assist pt's memory by compensations by pt and family. Pt is now putting clothes away more efficiently because his drawers are  labeled. Pt's daily orientation is better due to crossing off days on a calendar in the kitchen. SLP suggested, since pt asks to go out to get the mail multiple times per day that an alarm get set in a phone for a time after the mail would have come so that pt gets into that routine and hopefully once procedural memory begins - will no longer ask to get the mail.      Assessment / Recommendations / Plan   Plan Continue with current plan of care      Progression Toward Goals   Progression toward  goals Progressing toward goals            SLP Education - 06/03/20 1555    Education Details ways to compensate for [name]'s memory deficit, importance of procedural memory and structured schedule whenever possible, neurologist best person to ask about prognosis for overall recovery    Person(s) Educated Patient;Caregiver(s)    Methods Explanation    Comprehension Verbalized understanding            SLP Short Term Goals - 06/03/20 1600      SLP SHORT TERM GOAL #1   Title complete a simple linguistic task for 5 minutes with occasional min a back to task, in a quiet environment    Baseline tracking simple conversation <60 seconds    Time 3    Period Weeks   or 7 total sessions, for all STGs   Status On-going      SLP SHORT TERM GOAL #2   Title pt/family will report pt is using calendar to look for upcoming appointments, when cued to do so, between 3 sessions    Baseline not used currently    Time 3    Period Weeks    Status On-going      SLP SHORT TERM GOAL #3   Title pt/family will report pt is able to complete a simple task/chore at home with occasional min A back to task between 3 sessions    Baseline "constant' redirection    Time 3    Period Weeks    Status On-going      SLP SHORT TERM GOAL #4   Title pt will tell SLP of 2 of his cognitive deficits with modified independence (using a memory book/system)    Baseline one    Time 3    Period Weeks    Status On-going            SLP Long Term Goals - 06/03/20 1600      SLP LONG TERM GOAL #1   Title pt will correctly use a memory system for simple tasks (appointments, medication administration, etc) in two sessions    Baseline no system yet    Time 7    Period Weeks   or 15 total sessions, for all LTGs   Status On-going      SLP LONG TERM GOAL #2   Title pt will demonstrate knowledge he needs to use a memory system/notebook by going to the notebook at appropriate times in 4 sessions    Baseline no  notebook/system yet    Time 7    Period Weeks    Status On-going      SLP LONG TERM GOAL #3   Title pt will complete simple linguistic/cognitive task >/= 5 minutes with rare redirection back to task in 3 sessions    Baseline <60 seconds    Time 7    Period Weeks  Status On-going            Plan - 06/03/20 1558    Clinical Impression Statement [name] is a pt presenting today with severe cognitive communication deficits in the areas of attention, awareness, and memory. Pt is currenlty not able to live on his own. Pt requires minmal cueing about where his clean clothes go now that his drawers are labeled. However sister [name] states pt cont to require "constant" supervision to complete a simple task and about other daily routines (pt asked why door was open at 5:45 AM when sister takes dog out every morning at thta time for the past ~2 years. Pt would cont to benefit from skilled ST to target awareness, attention, and train family and pt in memory compensastions to relieve caregiver burden as well as bring pt closer to PLOF.    Speech Therapy Frequency 2x / week    Duration --   7 weeks or 15 total visits   Treatment/Interventions Cueing hierarchy;Cognitive reorganization;Patient/family education;Internal/external aids;Compensatory strategies;SLP instruction and feedback;Functional tasks;Multimodal communcation approach    Potential to Achieve Goals Fair    Potential Considerations Severity of impairments;Ability to learn/carryover information    Consulted and Agree with Plan of Care Patient;Family member/caregiver    Family Member Consulted sister [name]           Patient will benefit from skilled therapeutic intervention in order to improve the following deficits and impairments:   Cognitive communication deficit    Problem List Patient Active Problem List   Diagnosis Date Noted  . Neurocognitive disorder 03/18/2020  . Elevated blood pressure reading 08/24/2019  . Memory  changes 05/25/2019  . Non-traumatic rhabdomyolysis   . Acute GI bleeding 04/21/2019  . Diabetes mellitus type 2 in nonobese ([place]) 04/21/2019  . Seizures ([place]) 04/19/2019  . Acute blood loss anemia   . Duodenal ulcer hemorrhage   . Acute gastric ulcer without hemorrhage or perforation   . Acute renal failure (ARF) ([place]) 04/05/2019  . Acute metabolic encephalopathy [id number]    [facility] ,[place], CCC-SLP  06/03/2020, 4:01 PM  [facility] [address] [place], [place], [address] Phone: [phone]   Fax:  [phone]   Name: Nasheem Perdew MRN: [id number] Date of Birth: 1971-02-27

## 2020-06-03 NOTE — Patient Instructions (Signed)
Take Colace, 1-2 a day.  Take Metamucil, 1-2 tablespoons a day

## 2020-06-16 ENCOUNTER — Other Ambulatory Visit: Payer: Self-pay | Admitting: Internal Medicine

## 2020-06-16 DIAGNOSIS — E119 Type 2 diabetes mellitus without complications: Secondary | ICD-10-CM

## 2020-06-20 ENCOUNTER — Other Ambulatory Visit: Payer: Self-pay

## 2020-06-20 ENCOUNTER — Ambulatory Visit: Payer: Medicaid Other | Attending: Internal Medicine

## 2020-06-20 DIAGNOSIS — R41841 Cognitive communication deficit: Secondary | ICD-10-CM | POA: Insufficient documentation

## 2020-06-20 NOTE — Therapy (Signed)
[facility] [address] [place], [place], [address] Phone: [phone]   Fax:  [phone]  Speech Language Pathology Treatment  Patient Details  Name: Devontae Wierman MRN: [id number] Date of Birth: Apr 16, 1970 Referring Provider (SLP): Karle Plumber, MD   Encounter Date: 06/20/2020   End of Session - 06/20/20 1706    Visit Number 3    Number of Visits 15    Date for SLP Re-Evaluation 08/18/20    Authorization Type Medicaid (wellcare)    Authorization - Visit Number 2    Authorization - Number of Visits 7    SLP Start Time [id number]    SLP Stop Time  [id number]    SLP Time Calculation (min) 45 min    Activity Tolerance Patient limited by lethargy;Other (comment)           Past Medical History:  Diagnosis Date  . Cataract cortical, senile, bilateral   . Diabetes mellitus, type 2 ([place])   . Duodenal ulcer   . Fatty liver   . Ocular hypertension, bilateral   . Peptic ulcer disease   . Psoriasis   . Seizures ([place])     Past Surgical History:  Procedure Laterality Date  . BIOPSY  04/17/2019   Procedure: BIOPSY;  Surgeon: Irene Shipper, MD;  Location: [facility] ENDOSCOPY;  Service: Endoscopy;;  . ESOPHAGOGASTRODUODENOSCOPY Left 04/21/2019   Procedure: ESOPHAGOGASTRODUODENOSCOPY (EGD);  Surgeon: Jerene Bears, MD;  Location: [facility] ENDOSCOPY;  Service: Gastroenterology;  Laterality: Left;  . ESOPHAGOGASTRODUODENOSCOPY (EGD) WITH PROPOFOL N/A 04/17/2019   Procedure: ESOPHAGOGASTRODUODENOSCOPY (EGD) WITH PROPOFOL;  Surgeon: Irene Shipper, MD;  Location: [facility] ENDOSCOPY;  Service: Endoscopy;  Laterality: N/A;  . ESOPHAGOGASTRODUODENOSCOPY (EGD) WITH PROPOFOL N/A 04/23/2019   Procedure: ESOPHAGOGASTRODUODENOSCOPY (EGD) WITH PROPOFOL;  Surgeon: Jerene Bears, MD;  Location: [facility] ENDOSCOPY;  Service: Gastroenterology;  Laterality: N/A;  . HEMOSTASIS CLIP PLACEMENT  04/17/2019   Procedure: HEMOSTASIS CLIP PLACEMENT;  Surgeon: Irene Shipper, MD;  Location:  [facility] ENDOSCOPY;  Service: Endoscopy;;  . HEMOSTASIS CLIP PLACEMENT  04/21/2019   Procedure: HEMOSTASIS CLIP PLACEMENT;  Surgeon: Jerene Bears, MD;  Location: [facility] ENDOSCOPY;  Service: Gastroenterology;;  . IR FLUORO GUIDE CV LINE RIGHT  04/10/2019  . IR REMOVAL TUN CV CATH W/O FL  04/17/2019  . IR US GUIDE VASC ACCESS RIGHT  04/10/2019  . SUBMUCOSAL INJECTION  04/17/2019   Procedure: SUBMUCOSAL INJECTION;  Surgeon: Irene Shipper, MD;  Location: [facility] ENDOSCOPY;  Service: Endoscopy;;    There were no vitals filed for this visit.   Subjective Assessment - 06/20/20 1535    Subjective "anything would help" re: memory difficulties    Patient is accompained by: Family member   [name]   Currently in Pain? No/denies                 ADULT SLP TREATMENT - 06/20/20 1536      General Information   Behavior/Cognition Cooperative;Confused;Lethargic;Requires cueing      Treatment Provided   Treatment provided Cognitive-Linquistic      Cognitive-Linquistic Treatment   Treatment focused on Patient/family/caregiver education;Cognition    Skilled Treatment Earlee continues to report significant difficulty with short term recall, as pt endorsed he doesn't not remember having come to this building or completing ST sessions. Pt reports nearly continous sleeping and decreased activity during day, indicating lack of structure in day. SLP introduced memory book, including daily schedule and daily journal to promote recall. Given severity of memory deficits, pt will  require assistance to implement external memory aids. SLP discussed with sister [name] re: memory notebook to introduce structure to his day and facilitate opportunities to recall daily events. Further educational opportunities needed with caregivers to promote understanding of severity of pt deficits and level of assistance needed.      Assessment / Recommendations / Plan   Plan Continue with current plan of care      Progression Toward Goals   Progression  toward goals Progressing toward goals            SLP Education - 06/20/20 1706    Education Details memory notebook, daily schedule, daily journal    Person(s) Educated Patient;Caregiver(s)    Methods Explanation;Demonstration;Handout    Comprehension Verbalized understanding;Returned demonstration;Need further instruction            SLP Short Term Goals - 06/20/20 1707      SLP SHORT TERM GOAL #1   Title complete a simple linguistic task for 5 minutes with occasional min a back to task, in a quiet environment    Baseline tracking simple conversation <60 seconds    Time 2    Period Weeks   or 7 total sessions, for all STGs   Status On-going      SLP SHORT TERM GOAL #2   Title pt/family will report pt is using calendar to look for upcoming appointments, when cued to do so, between 3 sessions    Baseline not used currently, not consistently using    Time 2    Period Weeks    Status On-going      SLP SHORT TERM GOAL #3   Title pt/family will report pt is able to complete a simple task/chore at home with occasional min A back to task between 3 sessions    Baseline "constant' redirection    Time 2    Period Weeks    Status On-going      SLP SHORT TERM GOAL #4   Title pt will tell SLP of 2 of his cognitive deficits with modified independence (using a memory book/system)    Baseline one    Time 2    Period Weeks    Status On-going            SLP Long Term Goals - 06/20/20 1708      SLP LONG TERM GOAL #1   Title pt will correctly use a memory system for simple tasks (appointments, medication administration, etc) in two sessions    Baseline no system yet, memory book provided    Time 6    Period Weeks   or 15 total sessions, for all LTGs   Status On-going      SLP LONG TERM GOAL #2   Title pt will demonstrate knowledge he needs to use a memory system/notebook by going to the notebook at appropriate times in 4 sessions    Baseline no notebook/system yet    Time 6     Period Weeks    Status On-going      SLP LONG TERM GOAL #3   Title pt will complete simple linguistic/cognitive task >/= 5 minutes with rare redirection back to task in 3 sessions    Baseline <60 seconds    Time 6    Period Weeks    Status On-going            Plan - 06/20/20 1710    Clinical Impression Statement Radeen Kell presents with severe cognitive communication deficits in the areas  of attention, awareness, and memory. Pt demonstrates significant difficulty with short term recall and endorses minimal recall of recent events/activities. Abundance of sleeping reported by patient, with limited activity and engagement at home endorsed. SLP created and initiated memory book, with daily schedule and daily journal provided to assist with recall and implementation of schedule at home. Further education with caregivers required to facilitate increased comprehension of pt deficits and increase carryover of assistance required at home. Pt would cont to benefit from skilled ST to target awareness, attention, and train family and pt in memory compensastions to relieve caregiver burden as well as bring pt closer to PLOF.    Speech Therapy Frequency 2x / week    Duration Other (comment)   7 weeks or 15 total visits   Treatment/Interventions Cueing hierarchy;Cognitive reorganization;Patient/family education;Internal/external aids;Compensatory strategies;SLP instruction and feedback;Functional tasks;Multimodal communcation approach    Potential to Achieve Goals Fair    Potential Considerations Severity of impairments;Ability to learn/carryover information    SLP Home Exercise Plan provided    Consulted and Agree with Plan of Care Patient;Family member/caregiver    Family Member Consulted sister [name]           Patient will benefit from skilled therapeutic intervention in order to improve the following deficits and impairments:   Cognitive communication deficit    Problem List Patient Active  Problem List   Diagnosis Date Noted  . Neurocognitive disorder 03/18/2020  . Elevated blood pressure reading 08/24/2019  . Memory changes 05/25/2019  . Non-traumatic rhabdomyolysis   . Acute GI bleeding 04/21/2019  . Diabetes mellitus type 2 in nonobese ([place]) 04/21/2019  . Seizures ([place]) 04/19/2019  . Acute blood loss anemia   . Duodenal ulcer hemorrhage   . Acute gastric ulcer without hemorrhage or perforation   . Acute renal failure (ARF) ([place]) 04/05/2019  . Acute metabolic encephalopathy [id number]    Alinda Deem, MA CCC-SLP 06/20/2020, 5:14 PM  [facility] [address] [place], [place], [address] Phone: [phone]   Fax:  [phone]   Name: Kayston Burkart MRN: [id number] Date of Birth: [birth year]

## 2020-06-24 ENCOUNTER — Other Ambulatory Visit: Payer: Self-pay

## 2020-06-24 ENCOUNTER — Ambulatory Visit: Payer: Medicaid Other

## 2020-06-24 DIAGNOSIS — R41841 Cognitive communication deficit: Secondary | ICD-10-CM | POA: Diagnosis not present

## 2020-06-24 NOTE — Therapy (Signed)
[facility] [address] [place], [place], [address] Phone: [phone]   Fax:  [phone]  Speech Language Pathology Treatment  Patient Details  Name: [name] MRN: [id number] Date of Birth: Jul 07, 1970 Referring Provider (SLP): Karle Plumber, MD   Encounter Date: 06/24/2020   End of Session - 06/24/20 1653    Visit Number 4    Number of Visits 15    Date for SLP Re-Evaluation 08/18/20    Authorization Type Medicaid ([employer])    Authorization - Visit Number 3    Authorization - Number of Visits 7    SLP Start Time 3903    SLP Stop Time  0092    SLP Time Calculation (min) 44 min    Activity Tolerance Patient limited by lethargy;Other (comment)           Past Medical History:  Diagnosis Date  . Cataract cortical, senile, bilateral   . Diabetes mellitus, type 2 ([place])   . Duodenal ulcer   . Fatty liver   . Ocular hypertension, bilateral   . Peptic ulcer disease   . Psoriasis   . Seizures ([place])     Past Surgical History:  Procedure Laterality Date  . BIOPSY  04/17/2019   Procedure: BIOPSY;  Surgeon: Irene Shipper, MD;  Location: [facility] ENDOSCOPY;  Service: Endoscopy;;  . ESOPHAGOGASTRODUODENOSCOPY Left 04/21/2019   Procedure: ESOPHAGOGASTRODUODENOSCOPY (EGD);  Surgeon: Jerene Bears, MD;  Location[facility] ENDOSCOPY;  Service: Gastroenterology;  Laterality: Left;  . ESOPHAGOGASTRODUODENOSCOPY (EGD) WITH PROPOFOL N/A 04/17/2019   Procedure: ESOPHAGOGASTRODUODENOSCOPY (EGD) WITH PROPOFOL;  Surgeon: Irene Shipper, MD;  Location: [facility] ENDOSCOPY;  Service: Endoscopy;  Laterality: N/A;  . ESOPHAGOGASTRODUODENOSCOPY (EGD) WITH PROPOFOL N/A 04/23/2019   Procedure: ESOPHAGOGASTRODUODENOSCOPY (EGD) WITH PROPOFOL;  Surgeon: Jerene Bears, MD;  Location: [facility] ENDOSCOPY;  Service: Gastroenterology;  Laterality: N/A;  . HEMOSTASIS CLIP PLACEMENT  04/17/2019   Procedure: HEMOSTASIS CLIP PLACEMENT;  Surgeon: Irene Shipper, MD;  Location:  [facility] ENDOSCOPY;  Service: Endoscopy;;  . HEMOSTASIS CLIP PLACEMENT  04/21/2019   Procedure: HEMOSTASIS CLIP PLACEMENT;  Surgeon: Jerene Bears, MD;  Location: [facility] ENDOSCOPY;  Service: Gastroenterology;;  . IR FLUORO GUIDE CV LINE RIGHT  04/10/2019  . IR REMOVAL TUN CV CATH W/O FL  04/17/2019  . IR US GUIDE VASC ACCESS RIGHT  04/10/2019  . SUBMUCOSAL INJECTION  04/17/2019   Procedure: SUBMUCOSAL INJECTION;  Surgeon: Irene Shipper, MD;  Location: [facility] ENDOSCOPY;  Service: Endoscopy;;    There were no vitals filed for this visit.   Subjective Assessment - 06/24/20 1541    Subjective "I wake up sometines with my head spinning. Don't know why it is. I just sit there." pt states he can sit still for a little bit and vertigo subsides. [name] arrived with pt but pt comes back to ST alone.    Currently in Pain? No/denies                 ADULT SLP TREATMENT - 06/24/20 1542      General Information   Behavior/Cognition Cooperative;Confused;Lethargic;Requires cueing      Treatment Provided   Treatment provided Cognitive-Linquistic      Cognitive-Linquistic Treatment   Treatment focused on Patient/family/caregiver education;Cognition    Skilled Treatment Rahn came back alone with SLP to ST - sister [name] waited in lobby. SLP administered the [facility] Mental Status Examination. Pt scored 14/30, indicating a score in the "Dementia" range (1-20), which is worse than "  Mild Neuro-Cogntive Disorder" range (21-26). Sister stated pt and mother will be home alone during the week in a couple weeks and SLP gently brought up the suggestion of at home care during the day for [name], as pt is with significant attention deficits observed during therapy sessions and in examples from pt's sisters. Pt got 2/4 questions correct with immediate memory of paragraph narrative, and recalled 1/5 random words with 5 minute delay. Pt's problem solving skills were deficient with clock drawing task as pt looked at his clock (all  12 of the numbers were spaced from the :00 position to the :40 minute position) and could not problem solve how to correct his clock without SLP min cues. Even with this, pt placed hands in wrong position and did not realize this error, suggesting reduced error awareness. Given these findings SLP doubts pt would respond safely in an emergency situation if he were home alone.      Assessment / Recommendations / Plan   Plan Continue with current plan of care   request more Medicaid visits     Progression Toward Goals   Progression toward goals Progressing toward goals   severity of defiict concerning           SLP Education - 06/24/20 1653    Education Details "at home care" services may be helpful for when sister virtually returns to work on-site    Person(s) Educated Patient;Caregiver(s)    Methods Explanation    Comprehension Verbalized understanding            SLP Short Term Goals - 06/24/20 1656      SLP SHORT TERM GOAL #1   Title complete a simple linguistic task for 5 minutes with occasional min a back to task, in a quiet environment    Baseline tracking simple conversation <60 seconds    Time 1    Period Weeks   or 7 total sessions, for all STGs   Status Partially Met      SLP SHORT TERM GOAL #2   Title pt/family will report pt is using calendar to look for upcoming appointments, when cued to do so, between 3 sessions    Baseline not used currently, not consistently using    Status Achieved      SLP SHORT TERM GOAL #3   Title pt/family will report pt is able to complete a simple task/chore at home with occasional min A back to task between 3 sessions    Baseline "constant' redirection    Status Not Met      SLP SHORT TERM GOAL #4   Title pt will tell SLP of 2 of his cognitive deficits with modified independence (using a memory book/system)    Baseline one    Status Not Met            SLP Long Term Goals - 06/24/20 1658      SLP LONG TERM GOAL #1   Title pt  will correctly use a memory system for simple tasks (appointments, medication administration, etc) in two sessions    Baseline no system yet, memory book provided    Time 5    Period Weeks   or 15 total sessions, for all LTGs   Status On-going      SLP LONG TERM GOAL #2   Title pt will demonstrate knowledge he needs to use a memory system/notebook by going to the notebook at appropriate times in 4 sessions    Baseline no notebook/system yet  Time 5    Period Weeks    Status On-going      SLP LONG TERM GOAL #3   Title pt will complete simple linguistic/cognitive task >/= 5 minutes with rare redirection back to task in 3 sessions    Baseline <60 seconds    Time 5    Period Weeks    Status On-going            Plan - 06/24/20 1654    Clinical Impression Statement [name] presents with severe cognitive communication deficits in the areas of attention, awareness, and memory. Pt demonstrates significant difficulty with short term recall and endorses minimal recall of recent events/activities. Today pt scored 14/30 onthe SLUMS (in the "Dementia" range of scores from 1-20). Further education with caregivers required to facilitate increased comprehension of pt deficits and increase carryover of assistance required at home. Family is concerned because pt's sister who was working virtually will now in the next few weeks be working on-site and will no longer be at home during the work day. Pt would benefit from skilled ST for 10 total visits in order to target awareness, attention, and train family and pt in memory compensastions to relieve caregiver burden as well as bring pt closer to PLOF.    Speech Therapy Frequency 2x / week    Duration Other (comment)   7 weeks or 15 total visits   Treatment/Interventions Cueing hierarchy;Cognitive reorganization;Patient/family education;Internal/external aids;Compensatory strategies;SLP instruction and feedback;Functional tasks;Multimodal communcation  approach    Potential to Achieve Goals Fair    Potential Considerations Severity of impairments;Ability to learn/carryover information    SLP Home Exercise Plan provided    Consulted and Agree with Plan of Care Patient;Family member/caregiver    Family Member Consulted sister [name]           Patient will benefit from skilled therapeutic intervention in order to improve the following deficits and impairments:   Cognitive communication deficit    Problem List Patient Active Problem List   Diagnosis Date Noted  . Neurocognitive disorder 03/18/2020  . Elevated blood pressure reading 08/24/2019  . Memory changes 05/25/2019  . Non-traumatic rhabdomyolysis   . Acute GI bleeding 04/21/2019  . Diabetes mellitus type 2 in nonobese ([place]) 04/21/2019  . Seizures ([place]) 04/19/2019  . Acute blood loss anemia   . Duodenal ulcer hemorrhage   . Acute gastric ulcer without hemorrhage or perforation   . Acute renal failure (ARF) ([place]) 04/05/2019  . Acute metabolic encephalopathy 08/67/6195    [facility] 06/24/2020, 4:59 PM  [facility] [address] [place], [place], [address] Phone: [phone]   Fax:  [phone]   Name: [name] MRN: [id number] Date of Birth: 12-24-1970

## 2020-06-24 NOTE — Patient Instructions (Signed)
   Consider "care at home" services for when [name] returns to work.

## 2020-06-27 ENCOUNTER — Ambulatory Visit: Payer: Medicaid Other

## 2020-07-01 ENCOUNTER — Ambulatory Visit: Payer: Medicaid Other

## 2020-07-07 ENCOUNTER — Ambulatory Visit: Payer: Medicaid Other

## 2020-07-07 NOTE — Progress Notes (Signed)
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  His last EEG in January 2022 was within normal limits.  Neurological testing indicated major neurocognitive disorder likely due to multiple etiologies and adjustment disorder.  His last MMSE on January 2022 was 23/ 30.    Marland Kitchen Continue to stay active exercising  at least 30 minutes at least 3 times a week.  A pamphlet with literature was given to his sister, who will be calling [facility] to determine if the patient is a candidate for daycare, to increase interaction with other people, and activity. . Aricept initiation was discussed but sister would like first to try the [facility] approach.  . Mediterranean diet is recommended  . MRI of the brain to evaluate for bleeding, brain size and other abnormalities . PT for balance and strength . Continue speech therapy . Dr. Delice Lesch, has discussed with the patient and the family, the possibility of applying for disability to help with the financial issues. . Follow-up in 6 months  Intermittent left arm shaking, resolved.   Left arm weakness, suspect nerve conduction issues His last 48-hour EEG was negative for seizures. Continue levetiracetam 250 mg in the morning, 500 mg PM for now. 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 This patient is accompanied in the office by his sister and on the phone by his other sister, [name] who supplement the history.  My  previous records as well as any outside records available were reviewed prior to today's visit.  Since his last visit, the patient appears to be more sedentary, sleeping long periods of time, not participating in any of the activities of daily living at home.  At times, he is watching TV, and the sisters report that "he acts like he is not there ".  At times, the patient has some mood swings, getting frustrated, and walking away to his room.  Sisters report that despite these events, his Lexapro helps him maintaining his mood "a little more stable ".  He attends speech therapy, but the sisters report that it does not seem to work, "or where is this going ".  He has occasional headaches, especially in the parietal and occipital area, without any vision changes, nausea or vomiting.  Sometimes he becomes dizzy if getting up too fast or in a chair, therefore this is postural.  No dizziness or vertigo at rest.  He denies any falls, he does not use a walker or a cane.  He does walk slowly though.  Sisters report that he holds his left arm frequently, which he states it is hurting in the skin, and he has some tingling in there, but no other areas of complaint are verbalized.  He denies any numbness.  His appetite is decreased, and he likes to eat no nutritious foods.  No apparent trouble swallowing.  He does not wander off or drives.  Sisters report that at times, the patient speaks to animals, especially snakes, but not during the last couple  of months. Last MMSE was 23/30. He is not on Aricept at this time. He is also on Keppra 250 mg am and 500 mg pm for prior seizures (see below). His last EEG in April 27, 2020 was within normal limits.   Initial Encounter Note:  This is a 50 yo RH man with a history of hyperlipidemia, diabetes, remote seizures, in his usual state of health until the end of December 2020. He was found to be in renal failure, initial MRI brain showed changes in the left medial temporal  lobe/hippocampus and left thalamus. During his hospital stay, he had a GTC with left gaze deviation. Repeat MRI brain a few days later showed extensive signal abnormality in the subcortical bilateral frontoparietal lobes, occipital lobes, and posterior temporal lobes, with restricted diffusion suspicious for acute/early subacute infarcts. 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 Repeat EEG within normal limits. 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 He continues to have cognitive issues and needs supervision with medications. MMSE today 23/30. [facility] autoimmune panel (serum) will be ordered today. We discussed increasing Lexapro to 81m daily. They are reporting episodes of left arm shaking, a 48-hour EEG will be ordered to classify symptoms, continue Levetiracetam 257min AM, 50071mn PM for now. Continue speech therapy and physical therapy as planned. Follow-up in 3-4 months, they know to call for any changes.    PREVIOUS MEDICATIONS: none  CURRENT MEDICATIONS:  Outpatient Encounter Medications as of 51/04/2020  Medication Sig  . Accu-Chek FastClix Lancets MISC UAD  . acetaminophen (TYLENOL) 500 MG tablet Take 1 tablet (500 mg total) by mouth every 6 (six) hours as needed for mild pain, moderate pain, fever or headache.  . aMarland Kitchenorvastatin (LIPITOR) 20 MG tablet Take 1 tablet (20 mg total) by mouth daily.  . Blood Glucose Monitoring Suppl (ACCU-CHEK GUIDE ME) w/Device KIT UAD  . escitalopram (LEXAPRO) 20 MG tablet Take 1 tablet every night  . glimepiride (AMARYL) 2 MG tablet TAKE 1 TABLET BY MOUTH EVERY DAY BEFORE BREAKFAST  . glucose blood (ACCU-CHEK GUIDE) test  strip Use as instructed  . levETIRAcetam (KEPPRA) 500 MG tablet Take 1/2 tab in AM, 1 tab in PM (Patient taking differently: 500 mg. Take 1/2 tab in AM, 1 tab in PM)  . pantoprazole (PROTONIX) 40 MG tablet TAKE 1 TABLET BY MOUTH TWICE A DAY  . triamcinolone (KENALOG) 0.1 % Apply 1 application topically 2 (two) times daily.  . fexofenadine (ALLEGRA) 180 MG tablet Take 180 mg by mouth daily as needed for allergies or rhinitis. (Patient not taking: Reported on 07/08/2020)   No facility-administered encounter medications on file as of 07/08/2020.     Objective:     PHYSICAL EXAMINATION:    VITALS:   Vitals:   07/08/20 1106  BP: 118/85  Pulse: 96  Resp: 18  SpO2: 99%  Weight: 174 lb (78.9 kg)  Height: _0  (1.778 m)    GEN:  The patient appears stated age and is in NAD. HEENT:  Normocephalic, atraumatic.   Neurological examination:  Orientation: The patient is alert. Oriented to person, not to place or date.   Cranial nerves: There is good facial symmetry.The speech is fluent and clear, but he does not elaborate  in conversation.  Hearing is intact to conversational tone. Sensation: Sensation is intact to light touch on the right side, but not on the left, where the temperature, sensation are 3 out of 5 Motor: Strength is at least antigravity x4 on the right side, on the left is 3 out of 5. Movement examination: Tone: There is normal tone in the both upper and lower extremities on the right.  On the left, there is some left upper extremity orbiting, his strength is 3 out of 5.   Abnormal movements:  no tremor.  No myoclonus.  No asterixis.   Coordination:  There is no decremation with RAM's. Gait and Station: The patient has no difficulty arising out of a deep-seated chair without the use of the hands. The patient's stride length is good.  His stride is slow.  CBC    Component Value Date/Time   WBC 6.7 03/18/2020 1157   WBC 7.3 10/10/2019 0027   RBC 5.53 03/18/2020 1157   RBC  4.98 10/10/2019 0027   HGB 14.3 03/18/2020 1157   HCT 44.4 03/18/2020 1157   PLT 265 03/18/2020 1157   MCV 80 03/18/2020 1157   MCH 25.9 (L) 03/18/2020 1157   MCH 25.3 (L) 10/10/2019 0027   MCHC 32.2 03/18/2020 1157   MCHC 32.2 10/10/2019 0027   RDW 12.9 03/18/2020 1157   LYMPHSABS 2.1 10/09/2019 1304   MONOABS 0.7 10/09/2019 1304   EOSABS 0.9 (H) 10/09/2019 1304   BASOSABS 0.1 10/09/2019 1304     CMP Latest Ref Rng & Units 10/10/2019 10/09/2019 04/30/2019  Glucose 70 - 99 mg/dL 189(H) 85 66  BUN 6 - 20 mg/dL _0 Creatinine 0.61 - 1.24 mg/dL 1.04 0.93 1.17  Sodium 135 - 145 mmol/L 138 139 138  Potassium 3.5 - 5.1 mmol/L 3.6 4.0 4.8  Chloride 98 - 111 mmol/L 104 105 102  CO2 22 - 32 mmol/L _1 Calcium 8.9 - 10.3 mg/dL 8.6(L) 9.6 8.8  Total Protein 6.5 - 8.1 g/dL 5.8(L) 6.8 6.2  Total Bilirubin 0.3 - 1.2 mg/dL 0.3 0.6 0.3  Alkaline Phos 38 - 126 U/L 64 73 82  AST 15 - 41 U/L 24 26 35  ALT 0 - 44 U/L 37 41 44       Total time spent on today's visit was 60  minutes, including both face-to-face time and nonface-to-face time.  Time included that spent on review of records (prior notes available to me/labs/imaging if pertinent), discussing treatment and goals, answering patient's questions and coordinating care.  Cc:  Ladell Pier, MD Sharene Butters, PA-C

## 2020-07-08 ENCOUNTER — Ambulatory Visit (INDEPENDENT_AMBULATORY_CARE_PROVIDER_SITE_OTHER): Payer: Medicaid Other | Admitting: Neurology

## 2020-07-08 ENCOUNTER — Other Ambulatory Visit: Payer: Self-pay

## 2020-07-08 ENCOUNTER — Ambulatory Visit: Payer: Medicaid Other | Attending: Internal Medicine

## 2020-07-08 ENCOUNTER — Encounter: Payer: Self-pay | Admitting: Neurology

## 2020-07-08 VITALS — BP 118/85 | HR 96 | Resp 18 | Ht 70.0 in | Wt 174.0 lb

## 2020-07-08 DIAGNOSIS — F419 Anxiety disorder, unspecified: Secondary | ICD-10-CM | POA: Diagnosis not present

## 2020-07-08 DIAGNOSIS — F32A Depression, unspecified: Secondary | ICD-10-CM

## 2020-07-08 DIAGNOSIS — F0280 Dementia in other diseases classified elsewhere without behavioral disturbance: Secondary | ICD-10-CM | POA: Diagnosis not present

## 2020-07-08 DIAGNOSIS — I639 Cerebral infarction, unspecified: Secondary | ICD-10-CM | POA: Diagnosis not present

## 2020-07-08 DIAGNOSIS — R29898 Other symptoms and signs involving the musculoskeletal system: Secondary | ICD-10-CM | POA: Diagnosis not present

## 2020-07-08 DIAGNOSIS — R41841 Cognitive communication deficit: Secondary | ICD-10-CM | POA: Insufficient documentation

## 2020-07-08 NOTE — Progress Notes (Signed)
Patient was seen, evaluated, and treatment plan was discussed with the Advanced Practice Provider. Exam shows mostly decreased fine finger movements on left hand and orbiting around the left arm (seen with mild weakness). He reports left arm soreness. There is also slightly decreased reflexes on the left UE. EMG/NCV of the left UE will be ordered to further evaluate symptoms. Family continues to note cognitive and behavioral changes, there is likely a component of mood as well. MRI brain with and without contrast will be ordered to assess for underlying structural abnormality. Continue current dose of Levetiracetam. I have also reviewed the orders written for this patient which were under my direction. I agree with the findings and the plan of care as documented by the Advanced Practice Provider.

## 2020-07-08 NOTE — Therapy (Signed)
[facility] [address] [place], [place], [address] Phone: [phone]   Fax:  [phone]  Speech Language Pathology Treatment  Patient Details  Name: [name] MRN: [id number] Date of Birth: 1971/02/08 Referring Provider (SLP): Karle Plumber, MD   Encounter Date: 07/08/2020   End of Session - 07/08/20 1636    Visit Number 5    Number of Visits 15    Date for SLP Re-Evaluation 08/18/20    Authorization Type Medicaid (wellcare)    Authorization - Visit Number 4    Authorization - Number of Visits 7    SLP Start Time 2694    SLP Stop Time  8546    SLP Time Calculation (min) 46 min    Activity Tolerance Patient limited by lethargy;Other (comment)           Past Medical History:  Diagnosis Date  . Cataract cortical, senile, bilateral   . Diabetes mellitus, type 2 ([place])   . Duodenal ulcer   . Fatty liver   . Ocular hypertension, bilateral   . Peptic ulcer disease   . Psoriasis   . Seizures ([place])     Past Surgical History:  Procedure Laterality Date  . BIOPSY  04/17/2019   Procedure: BIOPSY;  Surgeon: Irene Shipper, MD;  Location: [facility] ENDOSCOPY;  Service: Endoscopy;;  . ESOPHAGOGASTRODUODENOSCOPY Left 04/21/2019   Procedure: ESOPHAGOGASTRODUODENOSCOPY (EGD);  Surgeon: Jerene Bears, MD;  Location: [facility] ENDOSCOPY;  Service: Gastroenterology;  Laterality: Left;  . ESOPHAGOGASTRODUODENOSCOPY (EGD) WITH PROPOFOL N/A 04/17/2019   Procedure: ESOPHAGOGASTRODUODENOSCOPY (EGD) WITH PROPOFOL;  Surgeon: Irene Shipper, MD;  Location: [facility] ENDOSCOPY;  Service: Endoscopy;  Laterality: N/A;  . ESOPHAGOGASTRODUODENOSCOPY (EGD) WITH PROPOFOL N/A 04/23/2019   Procedure: ESOPHAGOGASTRODUODENOSCOPY (EGD) WITH PROPOFOL;  Surgeon: Jerene Bears, MD;  Location: [facility] ENDOSCOPY;  Service: Gastroenterology;  Laterality: N/A;  . HEMOSTASIS CLIP PLACEMENT  04/17/2019   Procedure: HEMOSTASIS CLIP PLACEMENT;  Surgeon: Irene Shipper, MD;  Location:  [facility] ENDOSCOPY;  Service: Endoscopy;;  . HEMOSTASIS CLIP PLACEMENT  04/21/2019   Procedure: HEMOSTASIS CLIP PLACEMENT;  Surgeon: Jerene Bears, MD;  Location: [facility] ENDOSCOPY;  Service: Gastroenterology;;  . IR FLUORO GUIDE CV LINE RIGHT  04/10/2019  . IR REMOVAL TUN CV CATH W/O FL  04/17/2019  . IR US GUIDE VASC ACCESS RIGHT  04/10/2019  . SUBMUCOSAL INJECTION  04/17/2019   Procedure: SUBMUCOSAL INJECTION;  Surgeon: Irene Shipper, MD;  Location: [facility] ENDOSCOPY;  Service: Endoscopy;;    There were no vitals filed for this visit.   Subjective Assessment - 07/08/20 1450    Subjective "I think I had another appointment earlier this morning but I don't remember for what." Mina arrived with pt but pt came back to ST alone. "Have I been here before? I don't remember any of it."    Patient is accompained by: Family member   [name]   Currently in Pain? No/denies             SLP Evaluation [facility] - 07/08/20 1450      SLP Visit Information   SLP Received On 07/08/20              ADULT SLP TREATMENT - 07/08/20 1451      General Information   Behavior/Cognition Alert;Cooperative;Requires cueing;Distractible;Lethargic      Treatment Provided   Treatment provided Cognitive-Linquistic      Cognitive-Linquistic Treatment   Treatment focused on Cognition;Patient/family/caregiver education    Skilled Treatment Pt reports  sleeping a lot at home now. SLP ascertained that sister [name] gives pt his meds when it is med administration time. In a quiet environment, SLP provided pt some money (change, and dollar and change amounts) problems and pt completed with min-mod extra time for change and mod extra time for dollars and change amounts. 70% success without pt checking his answers, 80% with SLP asking pt to check over his answers. SLP encouraged pt to check his answers on the next sheet - 70%success. When cued by SLP to find his errors pt ID'd 2/3 errors. Mod SLP cues needed to ID all three errrors.SLP gave pt  homework for worksheets counting money. SLP invited sister [name] to come back and fill SLP in on home situations the last week. [name], reportedly, will enter the TV room, stare at the bird and maybe walk over to pet the bird and then return to his room, without saying anything to mother or sisters. SLP suggested to pt and sister this may be due to decr'd selective attention. [name] states she routinely asks pt for something in kitchen, uses it and then asks pt to put back for her and pt searches for where to put item. SLP suggested that this is attentional or memory based. SLP suggested [name] ask for 10 different spices, and have pt replace item immediately and see if placement is better after this, with a short delay. SLP again reiterated need for routine. [name] or [name] will make calls for more info on day programs that could fit pt's situation. SLP suggested home care agencies as well - suggested they reach out to MD or the agency/ies to check on how this works with Medicaid. Pt will have brain MRI and nerve testing due to more lt sided weakness.      Assessment / Recommendations / Plan   Plan Continue with current plan of care      Progression Toward Goals   Progression toward goals Not progressing toward goals (comment)   severity           SLP Education - 07/08/20 1635    Education Details in home care may be an option- talk to MD or home care agencies and inquire about Medicaid funding, need for routine    Person(s) Educated Patient;Caregiver(s)    Methods Explanation    Comprehension Verbalized understanding;Need further instruction            SLP Short Term Goals - 06/24/20 1656      SLP SHORT TERM GOAL #1   Title complete a simple linguistic task for 5 minutes with occasional min a back to task, in a quiet environment    Baseline tracking simple conversation <60 seconds    Time 1    Period Weeks   or 7 total sessions, for all STGs   Status Partially Met      SLP SHORT TERM GOAL #2    Title pt/family will report pt is using calendar to look for upcoming appointments, when cued to do so, between 3 sessions    Baseline not used currently, not consistently using    Status Achieved      SLP SHORT TERM GOAL #3   Title pt/family will report pt is able to complete a simple task/chore at home with occasional min A back to task between 3 sessions    Baseline "constant' redirection    Status Not Met      SLP SHORT TERM GOAL #4   Title pt will tell  SLP of 2 of his cognitive deficits with modified independence (using a memory book/system)    Baseline one    Status Not Met            SLP Long Term Goals - 07/08/20 1638      SLP LONG TERM GOAL #1   Title pt will correctly use a memory system for simple tasks (appointments, medication administration, etc) in two sessions    Baseline no system yet, memory book provided    Time 4    Period Weeks   or 15 total sessions, for all LTGs   Status On-going      SLP LONG TERM GOAL #2   Title pt will demonstrate knowledge he needs to use a memory system/notebook by going to the notebook at appropriate times in 4 sessions    Baseline no notebook/system yet    Time 4    Period Weeks    Status On-going      SLP LONG TERM GOAL #3   Title pt will complete simple linguistic/cognitive task >/= 5 minutes with rare redirection back to task in 3 sessions    Baseline <60 seconds;    07-08-20    Time 4    Period Weeks    Status On-going            Plan - 07/08/20 1637    Clinical Impression Statement Rayven Hendrickson presents with severe cognitive communication deficits in the areas of attention, awareness, and memory. Pt demonstrates significant difficulty with short term recall and endorses minimal recall of recent events/activities. Today he asked if he had been to [place] before and stated he does not recall being here before today. Further education with caregivers required to facilitate increased comprehension of pt deficits and increase  carryover of assistance required at home. Family is concerned because pt's sister who was working virtually will now, soon, be working on-site and will no longer be at home during the work day. Pt would benefit from skilled ST for 10 total visits in order to target awareness, attention, and train family and pt in memory compensastions to relieve caregiver burden as well as bring pt closer to PLOF.    Speech Therapy Frequency 2x / week    Duration Other (comment)   7 weeks or 15 total visits   Treatment/Interventions Cueing hierarchy;Cognitive reorganization;Patient/family education;Internal/external aids;Compensatory strategies;SLP instruction and feedback;Functional tasks;Multimodal communcation approach    Potential to Achieve Goals Fair    Potential Considerations Severity of impairments;Ability to learn/carryover information    SLP Home Exercise Plan provided    Consulted and Agree with Plan of Care Patient;Family member/caregiver    Family Member Consulted sister [name]           Patient will benefit from skilled therapeutic intervention in order to improve the following deficits and impairments:   Cognitive communication deficit    Problem List Patient Active Problem List   Diagnosis Date Noted  . Neurocognitive disorder 03/18/2020  . Elevated blood pressure reading 08/24/2019  . Memory changes 05/25/2019  . Non-traumatic rhabdomyolysis   . Acute GI bleeding 04/21/2019  . Diabetes mellitus type 2 in nonobese ([place]) 04/21/2019  . Seizures ([place]) 04/19/2019  . Acute blood loss anemia   . Duodenal ulcer hemorrhage   . Acute gastric ulcer without hemorrhage or perforation   . Acute renal failure (ARF) ([place]) 04/05/2019  . Acute metabolic encephalopathy 22/97/9892    [facility] ,[place], [place]  07/08/2020, 4:39 PM  [facility]  [place], [place], [address] Phone: [phone]   Fax:  [phone]   Name:  [name] MRN: [id number] Date of Birth: 1970-07-10

## 2020-07-08 NOTE — Patient Instructions (Addendum)
1. Schedule MRI brain with and without contrast  2. Schedule EMG/NCV of the left upper extremity  3. Start day program at [facility]  4. Continue all your other medicaitons  We have sent a referral to [facility] for your MRI and they will call you directly to schedule your appointment. They are located at [place]. If you need to contact them directly please call [phone].

## 2020-07-12 ENCOUNTER — Other Ambulatory Visit: Payer: Self-pay

## 2020-07-12 ENCOUNTER — Ambulatory Visit: Payer: Medicaid Other | Admitting: Neurology

## 2020-07-12 DIAGNOSIS — G5602 Carpal tunnel syndrome, left upper limb: Secondary | ICD-10-CM

## 2020-07-12 DIAGNOSIS — R29898 Other symptoms and signs involving the musculoskeletal system: Secondary | ICD-10-CM

## 2020-07-12 DIAGNOSIS — M5412 Radiculopathy, cervical region: Secondary | ICD-10-CM

## 2020-07-12 NOTE — Procedures (Signed)
[facility]  [place], [place]  [place],  [address] Tel: [phone] Fax:  [phone] Test Date:  07/12/2020  Patient: [name] DOB: [birth year] Physician: Narda Amber, DO  Sex: Male Height: '5\' 10"'$  Ref Phys: Ellouise Newer, M.D.  ID#: [id number]   Technician:    Patient Complaints: This is a 50 year old man referred for evaluation of left hand weakness and paresthesias.  NCV & EMG Findings: Extensive electrodiagnostic testing of the left upper extremity shows:  1. Left mixed palmar sensory response shows prolonged latency.  Left median and ulnar sensory responses are within normal limits. 2. Left median and ulnar motor responses are within normal limits.  Of note, there is evidence of a left Martin-Gruber anastomoses, a normal anatomic variant. 3. Chronic motor axonal loss changes are seen affecting the left biceps and deltoid muscles, without accompanied active denervation.  Impression: 1. Left median neuropathy at or distal to the wrist (very mild), consistent with a clinical diagnosis of carpal tunnel syndrome.   2. Chronic C5-6 radiculopathy affecting the left upper extremity, mild.   ___________________________ Narda Amber, DO    Nerve Conduction Studies Anti Sensory Summary Table   Stim Site NR Peak (ms) Norm Peak (ms) P-T Amp (V) Norm P-T Amp  Left Median Anti Sensory (2nd Digit)  34C  Wrist    3.4 <3.6 44.4 >15  Left Ulnar Anti Sensory (5th Digit)  34C  Wrist    3.1 <3.1 32.3 >10   Motor Summary Table   Stim Site NR Onset (ms) Norm Onset (ms) O-P Amp (mV) Norm O-P Amp Site1 Site2 Delta-0 (ms) Dist (cm) Vel (m/s) Norm Vel (m/s)  Left Median Motor (Abd Poll Brev)  34C  Wrist    3.5 <4.0 6.2 >6 Elbow Wrist 5.3 29.0 55 >50  Elbow    8.8  6.4  Ulnar-wrist crossover Elbow 4.4 0.0    Ulnar-wrist crossover    4.4  4.5         Left Ulnar Motor (Abd Dig Minimi)  34C  Wrist    2.0 <3.1 10.7 >7 B Elbow Wrist 4.1 23.0 56 >50  B Elbow     6.1  10.0  A Elbow B Elbow 1.9 10.0 53 >50  A Elbow    8.0  9.9          Comparison Summary Table   Stim Site NR Peak (ms) Norm Peak (ms) P-T Amp (V) Site1 Site2 Delta-P (ms) Norm Delta (ms)  Left Median/Ulnar Palm Comparison (Wrist - 8cm)  34C  Median Palm    2.0 <2.2 98.9 Median Palm Ulnar Palm 0.5   Ulnar Palm    1.5 <2.2 14.7       EMG   Side Muscle Ins Act Fibs Psw Fasc Number Recrt Dur Dur. Amp Amp. Poly Poly. Comment  Left 1stDorInt Nml Nml Nml Nml Nml Nml Nml Nml Nml Nml Nml Nml N/A  Left Abd Poll Brev Nml Nml Nml Nml Nml Nml Nml Nml Nml Nml Nml Nml N/A  Left PronatorTeres Nml Nml Nml Nml Nml Nml Nml Nml Nml Nml Nml Nml N/A  Left Biceps Nml Nml Nml Nml 1- Rapid Some 1+ Some 1+ Some 1+ N/A  Left Triceps Nml Nml Nml Nml Nml Nml Nml Nml Nml Nml Nml Nml N/A  Left Deltoid Nml Nml Nml Nml 1- Rapid Some 1+ Some 1+ Some 1+ N/A      Waveforms:

## 2020-07-15 ENCOUNTER — Ambulatory Visit: Payer: Medicaid Other

## 2020-07-15 ENCOUNTER — Other Ambulatory Visit: Payer: Self-pay

## 2020-07-15 DIAGNOSIS — R41841 Cognitive communication deficit: Secondary | ICD-10-CM | POA: Diagnosis not present

## 2020-07-15 NOTE — Therapy (Signed)
[facility] [address] [place], [place], [address] Phone: [phone]   Fax:  [phone]  Speech Language Pathology Treatment  Patient Details  Name: [name] MRN: [id number] Date of Birth: 11/26/1970 Referring Provider (SLP): Karle Plumber, MD   Encounter Date: 07/15/2020   End of Session - 07/15/20 1637    Visit Number 6    Number of Visits 15    Date for SLP Re-Evaluation 08/18/20    Authorization Type Medicaid (wellcare)    Authorization - Visit Number 5    Authorization - Number of Visits 7    SLP Start Time 4403    SLP Stop Time  4742    SLP Time Calculation (min) 43 min    Activity Tolerance Patient limited by lethargy;Other (comment)           Past Medical History:  Diagnosis Date  . Cataract cortical, senile, bilateral   . Diabetes mellitus, type 2 ([place])   . Duodenal ulcer   . Fatty liver   . Ocular hypertension, bilateral   . Peptic ulcer disease   . Psoriasis   . Seizures ([place])     Past Surgical History:  Procedure Laterality Date  . BIOPSY  04/17/2019   Procedure: BIOPSY;  Surgeon: Irene Shipper, MD;  Location: [facility] ENDOSCOPY;  Service: Endoscopy;;  . ESOPHAGOGASTRODUODENOSCOPY Left 04/21/2019   Procedure: ESOPHAGOGASTRODUODENOSCOPY (EGD);  Surgeon: Jerene Bears, MD;  Location: [facility] ENDOSCOPY;  Service: Gastroenterology;  Laterality: Left;  . ESOPHAGOGASTRODUODENOSCOPY (EGD) WITH PROPOFOL N/A 04/17/2019   Procedure: ESOPHAGOGASTRODUODENOSCOPY (EGD) WITH PROPOFOL;  Surgeon: Irene Shipper, MD;  Location: [facility] ENDOSCOPY;  Service: Endoscopy;  Laterality: N/A;  . ESOPHAGOGASTRODUODENOSCOPY (EGD) WITH PROPOFOL N/A 04/23/2019   Procedure: ESOPHAGOGASTRODUODENOSCOPY (EGD) WITH PROPOFOL;  Surgeon: Jerene Bears, MD;  Location: [facility] ENDOSCOPY;  Service: Gastroenterology;  Laterality: N/A;  . HEMOSTASIS CLIP PLACEMENT  04/17/2019   Procedure: HEMOSTASIS CLIP PLACEMENT;  Surgeon: Irene Shipper, MD;  Location:  [facility] ENDOSCOPY;  Service: Endoscopy;;  . HEMOSTASIS CLIP PLACEMENT  04/21/2019   Procedure: HEMOSTASIS CLIP PLACEMENT;  Surgeon: Jerene Bears, MD;  Location: [facility] ENDOSCOPY;  Service: Gastroenterology;;  . IR FLUORO GUIDE CV LINE RIGHT  04/10/2019  . IR REMOVAL TUN CV CATH W/O FL  04/17/2019  . IR US GUIDE VASC ACCESS RIGHT  04/10/2019  . SUBMUCOSAL INJECTION  04/17/2019   Procedure: SUBMUCOSAL INJECTION;  Surgeon: Irene Shipper, MD;  Location: [facility] ENDOSCOPY;  Service: Endoscopy;;    There were no vitals filed for this visit.   Subjective Assessment - 07/15/20 1554    Subjective "Just watch TV and sleep." (re: what pt has done today) Sister reports pt put his homework in his room and didn't look at it until this week after sister told pt he had homework.    Patient is accompained by: Family member   Mina-sister                ADULT SLP TREATMENT - 07/15/20 1525      General Information   Behavior/Cognition Alert;Cooperative;Requires cueing;Distractible;Lethargic      Treatment Provided   Treatment provided Cognitive-Linquistic      Cognitive-Linquistic Treatment   Treatment focused on Cognition;Patient/family/caregiver education    Skilled Treatment Pt in lobby alone - Mina in car on a call. SLP took pt back and in a structured cognitive language task (calendar task) pt with 25% success mostly due to decr'd attention. SLP provided pt mod-max cues consistently in  order to incr success for attention-based errors. Sister arrived at 43 and discussed additional ST sessions. SLP suggested pt wait to schedule  more sessions until attention improves to be able to recall from previous sessions.SLP suggested [place] website for increasing attention, while his sister is working from home she could [employer] with sustained attention skills with linguistic tasks. Family is considering a Actuary during the day, as Geoffrey's mother fell while Chi was home and he did not realize it - his sister came home  from work and found mother down. One more visit monday to train pt/sister in how to navigate Talk Path website.      Assessment / Recommendations / Plan   Plan Continue with current plan of care      Progression Toward Goals   Progression toward goals Not progressing toward goals (comment)   severity of attention deficits           SLP Education - 07/15/20 1637    Education Details talk path therapy    Person(s) Educated Patient;Caregiver(s)    Methods Explanation    Comprehension Verbalized understanding            SLP Short Term Goals - 06/24/20 1656      SLP SHORT TERM GOAL #1   Title complete a simple linguistic task for 5 minutes with occasional min a back to task, in a quiet environment    Baseline tracking simple conversation <60 seconds    Time 1    Period Weeks   or 7 total sessions, for all STGs   Status Partially Met      SLP SHORT TERM GOAL #2   Title pt/family will report pt is using calendar to look for upcoming appointments, when cued to do so, between 3 sessions    Baseline not used currently, not consistently using    Status Achieved      SLP SHORT TERM GOAL #3   Title pt/family will report pt is able to complete a simple task/chore at home with occasional min A back to task between 3 sessions    Baseline "constant' redirection    Status Not Met      SLP SHORT TERM GOAL #4   Title pt will tell SLP of 2 of his cognitive deficits with modified independence (using a memory book/system)    Baseline one    Status Not Met            SLP Long Term Goals - 07/15/20 1639      SLP LONG TERM GOAL #1   Title pt will correctly use a memory system for simple tasks (appointments, medication administration, etc) in two sessions    Baseline no system yet, memory book provided    Time 3    Period Weeks   or 15 total sessions, for all LTGs   Status On-going      SLP LONG TERM GOAL #2   Title pt will demonstrate knowledge he needs to use a memory  system/notebook by going to the notebook at appropriate times in 4 sessions    Baseline no notebook/system yet    Time 3    Period Weeks    Status On-going      SLP LONG TERM GOAL #3   Title pt will complete simple linguistic/cognitive task >/= 5 minutes with rare redirection back to task in 3 sessions    Baseline <60 seconds;    07-08-20, 07-15-20    Time 3  Period Weeks    Status On-going            Plan - 07/15/20 1638    Clinical Impression Statement Quinnten Calvin presents with severe cognitive communication deficits in the areas of attention, awareness, and memory. Pt demonstrates significant difficulty with short term recall and endorses minimal recall of recent events/activities. Because of his significant attention and thus memory deficit, progressing with intellectual awareness is also difficult for pt and so he is not motivated to complete homwork. Sister and SLP agree pt's improved attention may lead to more motivation for change in the future - plan is to d/c after next session. Pt would benefit from skilled ST for one more visit in order to target education of family/pt in navigating [place] online.    Speech Therapy Frequency 2x / week    Duration Other (comment)   7 weeks or 15 total visits   Treatment/Interventions Cueing hierarchy;Cognitive reorganization;Patient/family education;Internal/external aids;Compensatory strategies;SLP instruction and feedback;Functional tasks;Multimodal communcation approach    Potential to Achieve Goals Fair    Potential Considerations Severity of impairments;Ability to learn/carryover information    SLP Home Exercise Plan provided    Consulted and Agree with Plan of Care Patient;Family member/caregiver    Family Member Consulted sister [name]           Patient will benefit from skilled therapeutic intervention in order to improve the following deficits and impairments:   Cognitive communication deficit    Problem List Patient  Active Problem List   Diagnosis Date Noted  . Neurocognitive disorder 03/18/2020  . Elevated blood pressure reading 08/24/2019  . Memory changes 05/25/2019  . Non-traumatic rhabdomyolysis   . Acute GI bleeding 04/21/2019  . Diabetes mellitus type 2 in nonobese ([place]) 04/21/2019  . Seizures ([place]) 04/19/2019  . Acute blood loss anemia   . Duodenal ulcer hemorrhage   . Acute gastric ulcer without hemorrhage or perforation   . Acute renal failure (ARF) ([place]) 04/05/2019  . Acute metabolic encephalopathy 01/41/0301    [facility] ,[place], CCC-SLP  07/15/2020, 4:42 PM  [facility] [address] [place], [place], [address] Phone: [phone]   Fax:  [phone]   Name: Alejos Reinhardt MRN: [id number] Date of Birth: 1970/05/17

## 2020-07-20 ENCOUNTER — Other Ambulatory Visit: Payer: Self-pay

## 2020-07-20 ENCOUNTER — Ambulatory Visit: Payer: Medicaid Other

## 2020-07-20 DIAGNOSIS — R41841 Cognitive communication deficit: Secondary | ICD-10-CM | POA: Diagnosis not present

## 2020-07-20 NOTE — Patient Instructions (Signed)
   To access Talk Path Therapy: [email or web address]/  You will have to work through the simple tasks until you reach a level that will be challenging. That is OK - anything that works to extend your attention is good - even the easy exercises at the beginning.  Complete the exercises in all of these categories: Articles  - you have to click on the blue arrow on upper left to return to the other tasks Answering Questions Picture Recall [employer] [place]  Work on these for at least 30-45 minutes, at least three times a day.

## 2020-07-20 NOTE — Therapy (Signed)
[facility] [address] [place], [place], [address] Phone: [phone]   Fax:  [phone]  Speech Language Pathology Treatment/Discharge summary  Patient Details  Name: [name] MRN: [id number] Date of Birth: 1971/02/02 Referring Provider (SLP): Karle Plumber, MD   Encounter Date: 07/20/2020   End of Session - 07/20/20 1144    Visit Number 7    Number of Visits 15    Date for SLP Re-Evaluation 08/18/20    Authorization Type Medicaid (wellcare)    Authorization - Visit Number 6    Authorization - Number of Visits 7    SLP Start Time [phone]    SLP Stop Time  0930    SLP Time Calculation (min) 40 min    Activity Tolerance Patient limited by lethargy;Other (comment)   decr'd memory          Past Medical History:  Diagnosis Date  . Cataract cortical, senile, bilateral   . Diabetes mellitus, type 2 ([place])   . Duodenal ulcer   . Fatty liver   . Ocular hypertension, bilateral   . Peptic ulcer disease   . Psoriasis   . Seizures ([place])     Past Surgical History:  Procedure Laterality Date  . BIOPSY  04/17/2019   Procedure: BIOPSY;  Surgeon: Irene Shipper, MD;  Location: [facility] ENDOSCOPY;  Service: Endoscopy;;  . ESOPHAGOGASTRODUODENOSCOPY Left 04/21/2019   Procedure: ESOPHAGOGASTRODUODENOSCOPY (EGD);  Surgeon: Jerene Bears, MD;  Location: [facility] ENDOSCOPY;  Service: Gastroenterology;  Laterality: Left;  . ESOPHAGOGASTRODUODENOSCOPY (EGD) WITH PROPOFOL N/A 04/17/2019   Procedure: ESOPHAGOGASTRODUODENOSCOPY (EGD) WITH PROPOFOL;  Surgeon: Irene Shipper, MD;  Location: [facility] ENDOSCOPY;  Service: Endoscopy;  Laterality: N/A;  . ESOPHAGOGASTRODUODENOSCOPY (EGD) WITH PROPOFOL N/A 04/23/2019   Procedure: ESOPHAGOGASTRODUODENOSCOPY (EGD) WITH PROPOFOL;  Surgeon: Jerene Bears, MD;  Location: [facility] ENDOSCOPY;  Service: Gastroenterology;  Laterality: N/A;  . HEMOSTASIS CLIP PLACEMENT  04/17/2019   Procedure: HEMOSTASIS CLIP PLACEMENT;   Surgeon: Irene Shipper, MD;  Location:  [facility] ENDOSCOPY;  Service: Endoscopy;;  . HEMOSTASIS CLIP PLACEMENT  04/21/2019   Procedure: HEMOSTASIS CLIP PLACEMENT;  Surgeon: Jerene Bears, MD;  Location: [facility] ENDOSCOPY;  Service: Gastroenterology;;  . IR FLUORO GUIDE CV LINE RIGHT  04/10/2019  . IR REMOVAL TUN CV CATH W/O FL  04/17/2019  . IR US GUIDE VASC ACCESS RIGHT  04/10/2019  . SUBMUCOSAL INJECTION  04/17/2019   Procedure: SUBMUCOSAL INJECTION;  Surgeon: Irene Shipper, MD;  Location: [facility] ENDOSCOPY;  Service: Endoscopy;;    SPEECH THERAPY DISCHARGE SUMMARY  Visits from Start of Care: 7  Current functional level related to goals / functional outcomes: See goals below. Pt's memory deficit is severe to the point he told SLP that he did not recall the last session in 3-4 sessions this therapy course. Pt demonstrated as such in sessions, routinely. SLP suggested pt have sitter or attend a day program to ensure his safety, should he be left alone during the day.    Remaining deficits: Significant cognitive deficits, stemming from memory and/or attention.   Education / Equipment: Compensations for family for pt at home, need for sitter during the day or a day program pt can go to to ensure his safety at home if he should be alone.         Plan: Patient agrees to discharge.  Patient goals were partially met. Patient is being discharged due to                                                     ?????  pt met his current rehab potential at this time.   There were no vitals filed for this visit.   Subjective Assessment - 07/20/20 0852    Subjective Pt brought laptop.    Patient is accompained by: --   [name]- sister   Currently in Pain? No/denies                 ADULT SLP TREATMENT - 07/20/20 0852      General Information   Behavior/Cognition Alert;Cooperative;Requires cueing;Distractible;Lethargic      Treatment Provided   Treatment provided Cognitive-Linquistic      Cognitive-Linquistic  Treatment   Treatment focused on Cognition;Patient/family/caregiver education    Skilled Treatment Pt unaware of email address family used to set him up with [facility]. SLP did not want to involve sister at this point in the session so SLP had pt work through some modules on talk path -articles, picture recall, and others listed in "pt instructions". Pt with good attention for simple level tasks, but req'd cueing for how to return to main page after articles shifted his webpage making it necessary to use back arrow to return to the modules webpage ("menu") again. Pt also stopped after each module and did not inquire to SLP what to do next - instead just waited, making SLP initiate. SLP told sister this after session was over, alone in lobby. SLP provided "pt instructions" sheet for sister and pt, reviewing each item on the paper for each of them.      Assessment / Recommendations / Plan   Plan Discharge SLP treatment due to (comment)   pt maxed rehab potential at this time     Progression Toward Goals   Progression toward goals Not progressing toward goals (comment)   decr'd memory/severity of memory defict           SLP Education - 07/20/20 1635    Education Details nuances with Talk Path therapy, how to assist pt with Talk Path therapy    Person(s) Educated Patient;Caregiver(s)    Methods Explanation    Comprehension Verbalized understanding;Need further instruction            SLP Short Term Goals - 06/24/20 1656      SLP SHORT TERM GOAL #1   Title complete a simple linguistic task for 5 minutes with occasional min a back to task, in a quiet environment    Baseline tracking simple conversation <60 seconds    Time 1    Period Weeks   or 7 total sessions, for all STGs   Status Partially Met      SLP SHORT TERM GOAL #2   Title pt/family will report pt is using calendar to look for upcoming appointments, when cued to do so, between 3 sessions    Baseline not used currently, not  consistently using    Status Achieved      SLP SHORT TERM GOAL #3   Title pt/family will report pt is able to complete a simple task/chore at home with occasional min A back to task between 3 sessions    Baseline "constant' redirection    Status Not Met      SLP SHORT TERM GOAL #4   Title pt will tell SLP of 2 of his cognitive deficits with modified independence (using a memory book/system)    Baseline one    Status Not Met            SLP Long Term Goals - 07/20/20 1639  SLP LONG TERM GOAL #1   Title pt will correctly use a memory system for simple tasks (appointments, medication administration, etc) in two sessions    Baseline no system yet, memory book provided    Period --   or 15 total sessions, for all LTGs   Status Partially Met   calendar on refrigerator     SLP [place] #2   Title pt will demonstrate knowledge he needs to use a memory system/notebook by going to the notebook at appropriate times in 4 sessions    Baseline no notebook/system yet    Status Not Met      SLP LONG TERM GOAL #3   Title pt will complete simple linguistic/cognitive task >/= 5 minutes with rare redirection back to task in 3 sessions    Baseline <60 seconds;    07-08-20, 07-15-20    Status Achieved            Plan - 07/20/20 1636    Clinical Impression Statement [name] presents with severe cognitive communication deficits in the areas of attention, awareness, and memory. Pt demonstrates significant difficulty with short term recall and endorses minimal recall of recent events/activities. Because of his significant attention and thus memory deficit, progressing with intellectual awareness is also difficult for pt and so he is not motivated to complete homwork. Sister and SLP agree pt's improved attention may lead to more motivation for change in the future - plan is to d/c after next session. Pt would benefit from skilled ST for one more visit in order to target education of family/pt  in navigating [place] online.    Speech Therapy Frequency 2x / week    Duration Other (comment)   7 weeks or 15 total visits   Treatment/Interventions Cueing hierarchy;Cognitive reorganization;Patient/family education;Internal/external aids;Compensatory strategies;SLP instruction and feedback;Functional tasks;Multimodal communcation approach    Potential to Achieve Goals [place]    Potential Considerations Severity of impairments;Ability to learn/carryover information    SLP Home Exercise Plan provided    Consulted and Agree with Plan of Care Patient;Family member/caregiver    Family Member Consulted sister [name]           Patient will benefit from skilled therapeutic intervention in order to improve the following deficits and impairments:   Cognitive communication deficit    Problem List Patient Active Problem List   Diagnosis Date Noted  . Neurocognitive disorder 03/18/2020  . Elevated blood pressure reading 08/24/2019  . Memory changes 05/25/2019  . Non-traumatic rhabdomyolysis   . Acute GI bleeding 04/21/2019  . Diabetes mellitus type 2 in nonobese ([place]) 04/21/2019  . Seizures ([place]) 04/19/2019  . Acute blood loss anemia   . Duodenal ulcer hemorrhage   . Acute gastric ulcer without hemorrhage or perforation   . Acute renal failure (ARF) ([place]) 04/05/2019  . Acute metabolic encephalopathy 29/57/4734    [facility] ,[place], CCC-SLP  07/20/2020, 4:40 PM  [facility] [address] [place], [place], [address] Phone: [phone]   Fax:  [phone]   Name: [name] MRN: [id number] Date of Birth: October 05, 1970

## 2020-07-25 ENCOUNTER — Telehealth: Payer: Self-pay

## 2020-07-25 NOTE — Telephone Encounter (Signed)
-----   Message from Cameron Sprang, MD sent at 07/24/2020 11:16 PM EDT ----- Pls let sisters know the nerve test showed a pinched nerve at the neck and very mild carpal tunnel syndrome (pinched nerve at the wrist). Usually we start with physical therapy for the left arm weakness, and if no change, do MRI cervical spine without contrast. Has he started PT yet for balance/strengthening? Pls add on left arm weakness/radiculopathy for PT. Thanks

## 2020-07-25 NOTE — Telephone Encounter (Signed)
Spoke with pt sister  know the nerve test showed a pinched nerve at the neck and very mild carpal tunnel syndrome (pinched nerve at the wrist). Usually we start with physical therapy for the left arm weakness, and if no change, do MRI cervical spine without contrast. Pt sister stated that he has not stared PT yet because it was not approved yet

## 2020-08-05 ENCOUNTER — Other Ambulatory Visit: Payer: Medicaid Other

## 2020-08-27 ENCOUNTER — Other Ambulatory Visit: Payer: Self-pay

## 2020-08-27 ENCOUNTER — Ambulatory Visit
Admission: RE | Admit: 2020-08-27 | Discharge: 2020-08-27 | Disposition: A | Discharge status: Home / Self Care | Payer: Medicaid Other | Source: Ambulatory Visit | Attending: Neurology | Admitting: Neurology

## 2020-08-27 MED ORDER — GADOBENATE DIMEGLUMINE 529 MG/ML IV SOLN
16.0000 mL | Freq: Once | INTRAVENOUS | Status: AC | PRN
  Administered 2020-08-27: 16 mL via INTRAVENOUS

## 2020-09-01 ENCOUNTER — Telehealth: Payer: Self-pay

## 2020-09-01 NOTE — Telephone Encounter (Signed)
Spoke with Mr Figge's sister [name] informed her that the brain MRI did not show any changes from a year ago, no changes in the brain to cause his left arm problems, likely due to the pinched nerve in the neck.

## 2020-09-01 NOTE — Telephone Encounter (Signed)
-----   Message from Cameron Sprang, MD sent at 08/31/2020 12:25 PM EDT ----- Pls let his sisters know the brain MRI did not show any changes from a year ago, no changes in the brain to cause his left arm problems, likely due to the pinched nerve in the neck. Thanks

## 2020-09-13 ENCOUNTER — Other Ambulatory Visit: Payer: Self-pay | Admitting: Internal Medicine

## 2020-09-13 ENCOUNTER — Other Ambulatory Visit: Payer: Self-pay | Admitting: Neurology

## 2020-09-13 DIAGNOSIS — E785 Hyperlipidemia, unspecified: Secondary | ICD-10-CM

## 2020-09-13 DIAGNOSIS — E119 Type 2 diabetes mellitus without complications: Secondary | ICD-10-CM

## 2020-09-13 DIAGNOSIS — L309 Dermatitis, unspecified: Secondary | ICD-10-CM

## 2020-11-01 ENCOUNTER — Other Ambulatory Visit: Payer: Self-pay

## 2020-11-01 ENCOUNTER — Ambulatory Visit (INDEPENDENT_AMBULATORY_CARE_PROVIDER_SITE_OTHER): Payer: Medicaid Other | Admitting: Physician Assistant

## 2020-11-01 VITALS — BP 120/85 | HR 90 | Temp 98.4°F | Resp 18 | Ht 70.0 in | Wt 162.0 lb

## 2020-11-01 DIAGNOSIS — L309 Dermatitis, unspecified: Secondary | ICD-10-CM | POA: Diagnosis not present

## 2020-11-01 DIAGNOSIS — L299 Pruritus, unspecified: Secondary | ICD-10-CM | POA: Diagnosis not present

## 2020-11-01 MED ORDER — HYDROXYZINE HCL 10 MG PO TABS: 10.0000 mg | ORAL_TABLET | Freq: Three times a day (TID) | ORAL | 0 refills | Status: DC | PRN

## 2020-11-01 MED ORDER — TRIAMCINOLONE ACETONIDE 0.1 % EX CREA: TOPICAL_CREAM | CUTANEOUS | 1 refills | Status: DC

## 2020-11-01 NOTE — Progress Notes (Signed)
Patient has eaten ice cream today. Patient has taken medication today. Patient denies pain at this time. Patient complains of a rash progressing up his arms. Patient reports itching and picking at the sites.

## 2020-11-01 NOTE — Patient Instructions (Signed)
I refilled the steroid cream, use this on all affected areas.  Make sure to keep all wounds clean and dry, make sure to use Neosporin on any open wounds.  You can use the hydroxyzine 10 mg every 8 hours to help with the itchiness.  I started a referral for you to be seen by dermatology for further review.  Kennieth Rad, PA-C Physician Assistant [facility] [email or web address]   Atopic Dermatitis Atopic dermatitis is a skin disorder that causes inflammation of the skin. It is marked by a red rash and itchy, dry, scaly skin. It is the most common type of eczema. Eczema is a group of skin conditions that cause the skin to become rough and swollen. This condition is generally worse during the cooler wintermonths and often improves during the warm summer months. Atopic dermatitis usually starts showing signs in infancy and can last through adulthood. This condition cannot be passed from one person to another (is not contagious). Atopic dermatitis may not always be present, but when it is, it is called aflare-up. What are the causes? The exact cause of this condition is not known. Flare-ups may be triggered by: Coming in contact with something that you are sensitive or allergic to (allergen). Stress. Certain foods. Extremely hot or cold weather. Harsh chemicals and soaps. Dry air. Chlorine. What increases the risk? This condition is more likely to develop in people who have a personal or family history of: Eczema. Allergies. Asthma. Hay fever. What are the signs or symptoms? Symptoms of this condition include: Dry, scaly skin. Red, itchy rash. Itchiness, which can be severe. This may occur before the skin rash. This can make sleeping difficult. Skin thickening and cracking that can occur over time. How is this diagnosed? This condition is diagnosed based on: Your symptoms. Your medical history. A physical exam. How is this  treated? There is no cure for this condition, but symptoms can usually be controlled. Treatment focuses on: Controlling the itchiness and scratching. You may be given medicines, such as antihistamines or steroid creams. Limiting exposure to allergens. Recognizing situations that cause stress and developing a plan to manage stress. If your atopic dermatitis does not get better with medicines, or if it is all over your body (widespread), a treatment using a specific type of light (phototherapy) may be used. Follow these instructions at home: Skin care  Keep your skin well moisturized. Doing this seals in moisture and helps to prevent dryness. Use unscented lotions that have petroleum in them. Avoid lotions that contain alcohol or water. They can dry the skin. Keep baths or showers short (less than 5 minutes) in warm water. Do not use hot water. Use mild, unscented cleansers for bathing. Avoid soap and bubble bath. Apply a moisturizer to your skin right after a bath or shower. Do not apply anything to your skin without checking with your health care provider.  General instructions Take or apply over-the-counter and prescription medicines only as told by your health care provider. Dress in clothes made of cotton or cotton blends. Dress lightly because heat increases itchiness. When washing your clothes, rinse your clothes twice so all of the soap is removed. Avoid any triggers that can cause a flare-up. Keep your fingernails cut short. Avoid scratching. Scratching makes the rash and itchiness worse. A break in the skin from scratching could result in a skin infection (impetigo). Do not be around people who have cold sores or fever blisters. If you get the infection, it may  cause your atopic dermatitis to worsen. Keep all follow-up visits. This is important. Contact a health care provider if: Your itchiness interferes with sleep. Your rash gets worse or is not better within one week of  starting treatment. You have a fever. You have a rash flare-up after having contact with someone who has cold sores or fever blisters. Get help right away if: You develop pus or soft yellow scabs in the rash area. Summary Atopic dermatitis causes a red rash and itchy, dry, scaly skin. Treatment focuses on controlling the itchiness and scratching, limiting exposure to things that you are sensitive or allergic to (allergens), recognizing situations that cause stress, and developing a plan to manage stress. Keep your skin well moisturized. Keep baths or showers shorter than 5 minutes and use warm water. Do not use hot water. This information is not intended to replace advice given to you by your health care provider. Make sure you discuss any questions you have with your healthcare provider. Document Revised: 01/04/2020 Document Reviewed: 01/04/2020 Elsevier Patient Education  2022 [employer].

## 2020-11-01 NOTE — Progress Notes (Signed)
Established Patient Office Visit  Subjective:  Patient ID: [name], male    DOB: 1971/03/28  Age: 50 y.o. MRN: [id number]  CC:  Chief Complaint  Patient presents with   Rash    HPI [name] reports that he continues to have a rash on both arms.  Reports that this has been present for "some time," states that he was previously given a prescription cream from his primary care provider with some relief, but does endorse that the rash has spread to his upper arms as well as his lower legs.  Does endorse that he has been having elevated stress and anxiety, and does pick at the rash creating sores.  States that the rash is itchy.  Denies any new detergents, body washes, lotions, fragrances, no one else in the house has similar rash Sister is present and helps with history.   Past Medical History:  Diagnosis Date   Cataract cortical, senile, bilateral    Diabetes mellitus, type 2 ([place])    Duodenal ulcer    Fatty liver    Ocular hypertension, bilateral    Peptic ulcer disease    Psoriasis    Seizures ([place])     Past Surgical History:  Procedure Laterality Date   BIOPSY  04/17/2019   Procedure: BIOPSY;  Surgeon: Irene Shipper, MD;  Location: [facility] ENDOSCOPY;  Service: Endoscopy;;   ESOPHAGOGASTRODUODENOSCOPY Left 04/21/2019   Procedure: ESOPHAGOGASTRODUODENOSCOPY (EGD);  Surgeon: Jerene Bears, MD;  Location: [facility] ENDOSCOPY;  Service: Gastroenterology;  Laterality: Left;   ESOPHAGOGASTRODUODENOSCOPY (EGD) WITH PROPOFOL N/A 04/17/2019   Procedure: ESOPHAGOGASTRODUODENOSCOPY (EGD) WITH PROPOFOL;  Surgeon: Irene Shipper, MD;  Location: [facility] ENDOSCOPY;  Service: Endoscopy;  Laterality: N/A;   ESOPHAGOGASTRODUODENOSCOPY (EGD) WITH PROPOFOL N/A 04/23/2019   Procedure: ESOPHAGOGASTRODUODENOSCOPY (EGD) WITH PROPOFOL;  Surgeon: Jerene Bears, MD;  Location: [facility] ENDOSCOPY;  Service: Gastroenterology;  Laterality: N/A;   HEMOSTASIS CLIP PLACEMENT  04/17/2019   Procedure: HEMOSTASIS CLIP PLACEMENT;   Surgeon: Irene Shipper, MD;  Location: [facility] ENDOSCOPY;  Service: Endoscopy;;   HEMOSTASIS CLIP PLACEMENT  04/21/2019   Procedure: HEMOSTASIS CLIP PLACEMENT;  Surgeon: Jerene Bears, MD;  Location: [facility] ENDOSCOPY;  Service: Gastroenterology;;   IR FLUORO GUIDE CV LINE RIGHT  04/10/2019   IR REMOVAL TUN CV CATH W/O FL  04/17/2019   IR US GUIDE VASC ACCESS RIGHT  04/10/2019   SUBMUCOSAL INJECTION  04/17/2019   Procedure: SUBMUCOSAL INJECTION;  Surgeon: Irene Shipper, MD;  Location: [facility] ENDOSCOPY;  Service: Endoscopy;;    Family History  Problem Relation Age of Onset   Diabetes Mother    Diabetes Sister    Diabetes Father    Esophageal cancer Neg Hx    Stomach cancer Neg Hx    Colon cancer Neg Hx    Pancreatic cancer Neg Hx     Social History   Socioeconomic History   Marital status: Single    Spouse name: Not on file   Number of children: Not on file   Years of education: Not on file   Highest education level: Not on file  Occupational History   Not on file  Tobacco Use   Smoking status: Never   Smokeless tobacco: Never  Vaping Use   Vaping Use: Never used  Substance and Sexual Activity   Alcohol use: Not Currently   Drug use: Not Currently    Comment: 5 times per month (per 04/21/19 hospital note)   Sexual activity: Not on file  Other  Topics Concern   Not on file  Social History Narrative   Left handed   Drinks caffeine   Single story home   Social Determinants of Health   Financial Resource Strain: Not on file  Food Insecurity: Not on file  Transportation Needs: Not on file  Physical Activity: Not on file  Stress: Not on file  Social Connections: Not on file  Intimate Partner Violence: Not on file    Outpatient Medications Prior to Visit  Medication Sig Dispense Refill   Accu-Chek FastClix Lancets MISC UAD 100 each 0   acetaminophen (TYLENOL) 500 MG tablet Take 1 tablet (500 mg total) by mouth every 6 (six) hours as needed for mild pain, moderate pain, fever or headache.      atorvastatin (LIPITOR) 20 MG tablet TAKE 1 TABLET BY MOUTH EVERY DAY 90 tablet 0   Blood Glucose Monitoring Suppl (ACCU-CHEK GUIDE ME) w/Device KIT UAD 1 kit 0   escitalopram (LEXAPRO) 20 MG tablet Take 1 tablet every night 90 tablet 3   fexofenadine (ALLEGRA) 180 MG tablet Take 180 mg by mouth daily as needed for allergies or rhinitis. (Patient not taking: Reported on 07/08/2020)     glimepiride (AMARYL) 2 MG tablet TAKE 1 TABLET BY MOUTH EVERY DAY BEFORE BREAKFAST 90 tablet 0   glucose blood (ACCU-CHEK GUIDE) test strip Use as instructed 100 each 12   levETIRAcetam (KEPPRA) 500 MG tablet Take 1/2 tab in AM, 1 tab in PM (Patient taking differently: 500 mg. Take 1/2 tab in AM, 1 tab in PM) 135 tablet 3   pantoprazole (PROTONIX) 40 MG tablet TAKE 1 TABLET BY MOUTH TWICE A DAY 180 tablet 2   triamcinolone cream (KENALOG) 0.1 % APPLY TO AFFECTED AREA TWICE A DAY 30 g 0   No facility-administered medications prior to visit.    Allergies  Allergen Reactions   Penicillins Hives and Other (See Comments)    Childhood allergy  Did it involve swelling of the face/tongue/throat, SOB, or low BP? Unknown Did it involve sudden or severe rash/hives, skin peeling, or any reaction on the inside of your mouth or nose? Unknown Did you need to seek medical attention at a hospital or doctor's office? Unknown When did it last happen?      Pt was a child If all above answers are "NO", may proceed with cephalosporin use. Sister states he just breaks out in hives unsure about other reactions.      ROS Review of Systems  Constitutional: Negative.   HENT: Negative.    Eyes: Negative.   Respiratory:  Negative for shortness of breath.   Cardiovascular:  Negative for chest pain.  Gastrointestinal: Negative.   Endocrine: Negative.   Genitourinary: Negative.   Musculoskeletal: Negative.   Skin:  Positive for rash.  Allergic/Immunologic: Negative.   Neurological: Negative.   Hematological: Negative.    Psychiatric/Behavioral:  Negative for self-injury and suicidal ideas. The patient is nervous/anxious.      Objective:    Physical Exam Vitals and nursing note reviewed.  Constitutional:      Appearance: Normal appearance.  HENT:     Head: Normocephalic and atraumatic.     Right Ear: External ear normal.     Left Ear: External ear normal.     Nose: Nose normal.     Mouth/Throat:     Mouth: Mucous membranes are moist.     Pharynx: Oropharynx is clear.  Eyes:     Extraocular Movements: Extraocular movements intact.  Conjunctiva/sclera: Conjunctivae normal.     Pupils: Pupils are equal, round, and reactive to light.  Cardiovascular:     Rate and Rhythm: Normal rate and regular rhythm.     Pulses: Normal pulses.     Heart sounds: Normal heart sounds.  Pulmonary:     Effort: Pulmonary effort is normal.     Breath sounds: Normal breath sounds.  Musculoskeletal:        General: Normal range of motion.     Cervical back: Normal range of motion and neck supple.  Skin:    General: Skin is warm and dry.     Findings: Rash present. Rash is crusting.     Comments: Nonpustular scattered lesions in different stages of healing on both arms and both lower legs  Neurological:     General: No focal deficit present.     Mental Status: He is alert and oriented to person, place, and time.  Psychiatric:        Mood and Affect: Mood normal.        Behavior: Behavior normal.        Thought Content: Thought content normal.        Judgment: Judgment normal.    BP 120/85 (BP Location: Right Arm, Patient Position: Sitting, Cuff Size: Normal)   Pulse 90   Temp 98.4 F (36.9 C) (Temporal)   Resp 18   Ht 5' 10"  (1.778 m)   Wt 162 lb (73.5 kg)   SpO2 96%   BMI 23.24 kg/m  Wt Readings from Last 3 Encounters:  11/01/20 162 lb (73.5 kg)  07/08/20 174 lb (78.9 kg)  06/03/20 177 lb 9.6 oz (80.6 kg)     Health Maintenance Due  Topic Date Due   Zoster Vaccines- Shingrix (1 of 2) Never  done   COVID-19 Vaccine (4 - Booster for Pfizer series) 04/20/2020   URINE MICROALBUMIN  04/29/2020   FOOT EXAM  08/23/2020   HEMOGLOBIN A1C  09/16/2020   OPHTHALMOLOGY EXAM  10/29/2020    There are no preventive care reminders to display for this patient.  Lab Results  Component Value Date   TSH 2.065 04/08/2019   Lab Results  Component Value Date   WBC 6.7 03/18/2020   HGB 14.3 03/18/2020   HCT 44.4 03/18/2020   MCV 80 03/18/2020   PLT 265 03/18/2020   Lab Results  Component Value Date   NA 138 10/10/2019   K 3.6 10/10/2019   CO2 23 10/10/2019   GLUCOSE 189 (H) 10/10/2019   BUN 17 10/10/2019   CREATININE 1.04 10/10/2019   BILITOT 0.3 10/10/2019   ALKPHOS 64 10/10/2019   AST 24 10/10/2019   ALT 37 10/10/2019   PROT 5.8 (L) 10/10/2019   ALBUMIN 3.7 10/10/2019   CALCIUM 8.6 (L) 10/10/2019   ANIONGAP 11 10/10/2019   Lab Results  Component Value Date   CHOL 169 03/18/2020   Lab Results  Component Value Date   HDL 41 03/18/2020   Lab Results  Component Value Date   LDLCALC 108 (H) 03/18/2020   Lab Results  Component Value Date   TRIG 112 03/18/2020   Lab Results  Component Value Date   CHOLHDL 4.1 03/18/2020   Lab Results  Component Value Date   HGBA1C 7.5 (A) 03/18/2020      Assessment & Plan:   Problem List Items Addressed This Visit   None Visit Diagnoses     Dermatitis    -  Primary   Relevant  Medications   triamcinolone cream (KENALOG) 0.1 %   Other Relevant Orders   Ambulatory referral to Dermatology   Pruritus       Relevant Medications   hydrOXYzine (ATARAX/VISTARIL) 10 MG tablet       Meds ordered this encounter  Medications   hydrOXYzine (ATARAX/VISTARIL) 10 MG tablet    Sig: Take 1 tablet (10 mg total) by mouth 3 (three) times daily as needed.    Dispense:  30 tablet    Refill:  0    Order Specific Question:   Supervising Provider    Answer:   Elsie Stain [[id number]]   triamcinolone cream (KENALOG) 0.1 %    Sig: APPLY  TO AFFECTED AREA TWICE A DAY    Dispense:  80 g    Refill:  1    Order Specific Question:   Supervising Provider    Answer:   WRIGHT, PATRICK E [[id number]]  1. Dermatitis Resume Kenalog, refer to dermatology for further evaluation patient education given on supportive care, avoid picking at sites - triamcinolone cream (KENALOG) 0.1 %; APPLY TO AFFECTED AREA TWICE A DAY  Dispense: 80 g; Refill: 1 - Ambulatory referral to Dermatology  2. Pruritus Trial hydroxyzine 10 mg every 8 hours as needed - hydrOXYzine (ATARAX/VISTARIL) 10 MG tablet; Take 1 tablet (10 mg total) by mouth 3 (three) times daily as needed.  Dispense: 30 tablet; Refill: 0    I have reviewed the patient's medical history (PMH, PSH, Social History, Family History, Medications, and allergies) , and have been updated if relevant. I spent 32 minutes reviewing chart and  face to face time with patient.    Follow-up: Return if symptoms worsen or fail to improve.    Loraine Grip Mayers, PA-C

## 2020-11-02 ENCOUNTER — Encounter: Payer: Self-pay | Admitting: Physician Assistant

## 2020-11-02 ENCOUNTER — Telehealth: Payer: Self-pay | Admitting: *Deleted

## 2020-11-02 DIAGNOSIS — E1142 Type 2 diabetes mellitus with diabetic polyneuropathy: Secondary | ICD-10-CM

## 2020-11-02 MED ORDER — ACCU-CHEK GUIDE VI STRP: ORAL_STRIP | 12 refills | Status: DC

## 2020-11-02 MED ORDER — ACCU-CHEK GUIDE ME W/DEVICE KIT: PACK | 0 refills | Status: DC

## 2020-11-02 MED ORDER — ACCU-CHEK FASTCLIX LANCETS MISC: 0 refills | Status: DC

## 2020-11-02 NOTE — Telephone Encounter (Signed)
DM supplies sent to [facility]

## 2020-11-09 ENCOUNTER — Encounter: Payer: Self-pay | Admitting: Neurology

## 2020-11-09 ENCOUNTER — Other Ambulatory Visit: Payer: Self-pay

## 2020-11-09 ENCOUNTER — Ambulatory Visit: Payer: Medicaid Other | Admitting: Neurology

## 2020-11-09 VITALS — BP 128/80 | HR 83 | Ht 70.0 in | Wt 170.6 lb

## 2020-11-09 DIAGNOSIS — M5412 Radiculopathy, cervical region: Secondary | ICD-10-CM | POA: Diagnosis not present

## 2020-11-09 DIAGNOSIS — G40909 Epilepsy, unspecified, not intractable, without status epilepticus: Secondary | ICD-10-CM

## 2020-11-09 DIAGNOSIS — F321 Major depressive disorder, single episode, moderate: Secondary | ICD-10-CM

## 2020-11-09 DIAGNOSIS — F0280 Dementia in other diseases classified elsewhere without behavioral disturbance: Secondary | ICD-10-CM

## 2020-11-09 NOTE — Patient Instructions (Addendum)
Continue Keppra '500mg'$ : take 1/2 tablet in AM, 1 tablet in PM  2. [facility] [place] [facility] [place] 3. Referral will be sent for Physical therapy for the left arm  4. Look into [facility] to join day program  5. Follow-up in 6 months, call for any changes

## 2020-11-09 NOTE — Progress Notes (Signed)
NEUROLOGY FOLLOW UP OFFICE NOTE  Nathanel Tallman [id number] [birth year]  HISTORY OF PRESENT ILLNESS: I had the pleasure of seeing Gorje Iyer in follow-up in the neurology clinic on 11/09/2020.  The patient was last seen 4 months ago for Major Neurocognitive disorder likely due to multiple etiologies. He is again accompanied by his sister [name] who helps supplement the history today. His other sister [name] was on speakerphone.  Records and images were personally reviewed where available.  On his last visit, they continued to report cognitive and behavioral changes, likely with mood component (adjustment disorder). They had noticed left arm weakness and previously reported left arm shaking. Ambulatory EEG in January 2022 was normal. 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 He had an EMG/NCV of the left arm in 07/2020 which showed very mild left median neuropathy and mild chronic C5-6 radiculopathy.   Since his last visit, [name] reports things are the same. He states his left arm is still sore, there is occasional weakness. He still has the left arm shaking occurring 1-2 times a week, sometimes waking him up from sleep, lasting 10-15 minutes. There is no post-event weakness, it is "irritating" more than weakness. Face and leg unaffected. [name] reports that she still sees where he is "just off," some mornings he is alert, other times he is walking around in a daze. He is very inwards, not communicative, mostly sitting in his room. [name] reports he does not have any routine, he states "I just sleep." He had speech cognitive therapy which were not very productive. He has been having skin lesions in his shoulders, upper arms, knees. The cream prescribed by his PCP helps a little, he is seeing Dermatology in October. Mina recalls a history of  psoriasis in childhood.    History on Initial Assessment 06/16/2019: This is a 50 year old right-handed man with a history of hyperlipidemia, diabetes, presenting for evaluation of seizure and cognitive changes. His sister [name] is present during the visit to provide additional information due to continued cognitive changes. He was in his usual state of health until the week of Christmas 2020 when he had a toothache. [name] reports he has very bad teeth, he took Tylenol, then told her he was not feeling good on 12/7. He was talking fine, recognizing his sister, he was confused and out of it, so they brought him to the ER. [name] reported marijuana use, no alcohol or other illicit drugs. His BP was 156/104, HR 112. He was found to be in renal failure with creatinine of 10.38, AST 22, ALT 156. UDS positive for THC. MRI brain without contrast done 04/07/19 showed abnormal signal in the medial temporal lobe/hippocampus and posteromedial left thalamus, mild associated parenchymal swelling, chronic blood products along the right parietal lobe. He had an 18-hour EEG showing continuous rhythmic 2-3 Hz delta activity in the right temporoparietal region, at times sharply contoured. There was also slowing on the left hemisphere, and intermittent rhythmic generalized delta slowing. During his hospital stay, he had a generalized tonic-clonic seizure on 04/10/19, then another with note of left gaze deviation, left upper extremity extended, unresponsive. EEG showed diffuse slowing and triphasic waves. Repeat EEG showed generalized slowing, triphasic waves. He had a lumbar puncture with WBC 3, protein 53, glucose 62. Negative HSV, VZV, ACE. Negative anti-Jo, dsDNA, SS-A, SS-B, SCL 70, anti-SM Ab.  Repeat MRI brain done 04/15/19 showed new changes with fairly extensive subcortical greater than cortical signal abnormality in the bilateral frontoparietal lboes, occipital lobes, and posterior temporal lobes. Patchy cortical restricted diffuse  suspicious for acute/early subacute infarcts. There was also new signal change in the bilateral cerebellum and patchy restricted diffusion suspicious for acute/early subacute infarcts. 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 He received 5 days of IV Solu-Medrol. It was noted that his BP on admission was 156/104, as high as 219/98 on 1/4. EEG on 12/29 showed diffuse delta slowing in the right temporoparietal region, slowing on the left hemisphere.   He has a prior history of seizures, his sister reports a few seizures while he was teething. In 2006/2007 he had a couple of seizure where his eyes rolled back with stiffening and shaking. He was evaluated by Dr. Gaynell Face in 2007. At that time, he woke up dizzy with rotatory nystagmus, headache, followed by left focal motor seizure. LP at that time RBCs 11,320, likely a traumatic tap, however due to sudden onset headache and possible seizure, catheter angiogram was done, which was normal. EEG in 2007 reported occasional sharp transients in the left frontotemporal region. MRI brain no acute changes.   His sister reports that "his memory just went." He did not recognize her when she visited him in the hospital. Physically he is getting better, his sister makes sure he eats regularly. Cognitively he has not improved, they need to repeat and reiterate themselves to him. No staring/unresponsiveness, but he has delayed responses, like he has to overprocess information. He is able to bathe and dress himself, but she has to remind him to shower because he thinks he just had one. Most of the time he is calm, he occasionally gets very agitated for no reason, then calms down. No paranoia or hallucinations. When he initially came home, he did not remember he had a dog and kept calling him "dog," so afraid of him. It took several weeks to be comfortable around him. She reports that he was not taking any medications prior to  hospital admission. He used to smoke a lot of weed and "swears up and down that he had not used in years," but [name] says he used it daily. No alcohol use. A few days after hospital discharge, his family found him on the floor, he was hypotensive and had soiled his clothes, then was standing in a pool of blood. He had acute GI bleed secondary to PUD.   He feels his memory is "good, bad sometimes." He has some dizziness, drowsiness, and fatigue. Balance is bad in the mornings. He has some tingling in his legs. He has occasional headaches in the back of his head with throbbing, sensitivity to lights/sounds. No nausea/vomiting. Vision is sometimes blurred. He has occasional neck pain. No bowel/bladder dysfunction. His sister reports his legs are twitching constantly, decreasing when he lifts his legs up. [name] has not witnessed any seizures, he is taking Levetiracetam 569m BID.   Diagnostic Data: Initial MRI brain in 03/2019 showed changes in the left medial temporal lobe/hippocampus and left thalamus.   During his hospital stay, he had a GTC with left gaze deviation. Repeat MRI brain a few days later showed extensive signal abnormality in the subcortical bilateral frontoparietal lobes, occipital lobes, and posterior temporal lobes, with restricted diffusion suspicious for acute/early subacute infarcts. 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   MRI brain  in June 2021 improved from prior, with scattered small areas of cortical encephalomalacia, extent much less compared to prior, suspect PRES with a few areas progressing to infarction.   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  1-hour wake and sleep EEG in 09/2019 normal  45-hour EEG in January 2022 normal, typical events not captured.  [facility] panel negative 03/2020   PAST MEDICAL HISTORY: Past  Medical History:  Diagnosis Date   Cataract cortical, senile, bilateral    Diabetes mellitus, type 2 ([place])    Duodenal ulcer    Fatty liver    Ocular hypertension, bilateral    Peptic ulcer disease    Psoriasis    Seizures ([place])     MEDICATIONS: Current Outpatient Medications on File Prior to Visit  Medication Sig Dispense Refill   Accu-Chek FastClix Lancets MISC UAD 100 each 0   acetaminophen (TYLENOL) 500 MG tablet Take 1 tablet (500 mg total) by mouth every 6 (six) hours as needed for mild pain, moderate pain, fever or headache.     atorvastatin (LIPITOR) 20 MG tablet TAKE 1 TABLET BY MOUTH EVERY DAY 90 tablet 0   Blood Glucose Monitoring Suppl (ACCU-CHEK GUIDE ME) w/Device KIT UAD 1 kit 0   escitalopram (LEXAPRO) 20 MG tablet Take 1 tablet every night 90 tablet 3   fexofenadine (ALLEGRA) 180 MG tablet Take 180 mg by mouth daily as needed for allergies or rhinitis. (Patient not taking: Reported on 07/08/2020)     glimepiride (AMARYL) 2 MG tablet TAKE 1 TABLET BY MOUTH EVERY DAY BEFORE BREAKFAST 90 tablet 0   glucose blood (ACCU-CHEK GUIDE) test strip Use as instructed 100 each 12   hydrOXYzine (ATARAX/VISTARIL) 10 MG tablet Take 1 tablet (10 mg total) by mouth 3 (three) times daily as needed. 30 tablet 0   levETIRAcetam (KEPPRA) 500 MG tablet Take 1/2 tab in AM, 1 tab in PM (Patient taking differently: 500 mg. Take 1/2 tab in AM, 1 tab in PM) 135 tablet 3   pantoprazole (PROTONIX) 40 MG tablet TAKE 1 TABLET BY MOUTH TWICE A DAY 180 tablet 2   triamcinolone cream (KENALOG) 0.1 % APPLY TO AFFECTED AREA TWICE A DAY 80 g 1   No current facility-administered medications on file prior to visit.    ALLERGIES: Allergies  Allergen Reactions   Penicillins Hives and Other (See Comments)    Childhood allergy  Did it involve swelling of the face/tongue/throat, SOB, or low BP? Unknown Did it involve sudden or severe rash/hives, skin peeling, or any reaction on the inside of your mouth or  nose? Unknown Did you need to seek medical attention at a hospital or doctor's office? Unknown When did it last happen?      Pt was a child If all above answers are "NO", may proceed with cephalosporin use. Sister states he just breaks out in hives unsure about other reactions.      FAMILY HISTORY: Family History  Problem Relation Age of Onset   Diabetes Mother    Diabetes Sister    Diabetes Father    Esophageal cancer Neg Hx    Stomach cancer Neg Hx    Colon cancer Neg Hx    Pancreatic cancer Neg Hx     SOCIAL HISTORY: Social History   Socioeconomic History   Marital status: Single    Spouse name: Not on file   Number of children: Not on file   Years of education: Not  on file   Highest education level: Not on file  Occupational History   Not on file  Tobacco Use   Smoking status: Never   Smokeless tobacco: Never  Vaping Use   Vaping Use: Never used  Substance and Sexual Activity   Alcohol use: Not Currently   Drug use: Not Currently    Comment: 5 times per month (per 04/21/19 hospital note)   Sexual activity: Not on file  Other Topics Concern   Not on file  Social History Narrative   Left handed   Drinks caffeine   Single story home   Social Determinants of Health   Financial Resource Strain: Not on file  Food Insecurity: Not on file  Transportation Needs: Not on file  Physical Activity: Not on file  Stress: Not on file  Social Connections: Not on file  Intimate Partner Violence: Not on file     PHYSICAL EXAM: Vitals:   11/09/20 1024  BP: 128/80  Pulse: 83  SpO2: 99%   General: No acute distress, flat affect, NO eye contact Head:  Normocephalic/atraumatic Skin/Extremities: No rash, no edema Neurological Exam: alert and awake. No aphasia or dysarthria. Fund of knowledge is reduced.  Attention and concentration are reduced.   Cranial nerves: Pupils equal, round. Extraocular movements intact with no nystagmus. Visual fields full.  No facial asymmetry.   Motor: Bulk and tone normal, muscle strength 5/5 throughout with no pronator drift.   Finger to nose testing intact.  Gait narrow-based and steady, able to tandem walk adequately.  Romberg negative.   IMPRESSION: This is a 50 yo RH man with a history of hyperlipidemia, diabetes, remote seizures, in his usual state of health until the end of December 2020. He was found to be in renal failure, initial MRI brain showed changes in the left medial temporal lobe/hippocampus and left thalamus. During his hospital stay, he had a GTC with left gaze deviation. Repeat MRI brain a few days later showed extensive signal abnormality in the subcortical bilateral frontoparietal lobes, occipital lobes, and posterior temporal lobes, with restricted diffusion suspicious for acute/early subacute infarcts. 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 Follow-up MRI brain June 2021 and most recently May 2022 no acute changes with scattered small areas of cortical encephalomalacia due to suspected PRES with a few areas progressing to infarction. 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 Autoimmune panel negative. Prolonged EEG normal.   He continues to have cognitive and behavioral changes, there is likely a strong component of mood contributing to his symptoms, there is no eye contact in the office today. Refer to Psychiatry and psychotherapy. Advised to join day programs. Discussed left arm concerns, EMG showed mild left cervical radiculopathy at C5-6, refer to PT for radiculopathy. Continue Levetiracetam 256m in AM, 5040min PM for now, may consider switching to different seizure medication if needed. Follow-up with Dermatology as scheduled. Follow-up in 6 months, call for any changes.   Thank you for allowing me to participate in his care.  Please do not hesitate to call for any questions  or concerns.    KaEllouise NewerM.D.   CC: Dr. JoWynetta Emery

## 2020-12-01 ENCOUNTER — Other Ambulatory Visit: Payer: Self-pay | Admitting: Physician Assistant

## 2020-12-01 DIAGNOSIS — E1142 Type 2 diabetes mellitus with diabetic polyneuropathy: Secondary | ICD-10-CM

## 2020-12-10 ENCOUNTER — Other Ambulatory Visit: Payer: Self-pay | Admitting: Internal Medicine

## 2020-12-10 DIAGNOSIS — E119 Type 2 diabetes mellitus without complications: Secondary | ICD-10-CM

## 2020-12-13 NOTE — Telephone Encounter (Signed)
Appt scheduled for 02/09/21

## 2020-12-29 ENCOUNTER — Other Ambulatory Visit: Payer: Self-pay | Admitting: Internal Medicine

## 2020-12-29 ENCOUNTER — Other Ambulatory Visit: Payer: Self-pay | Admitting: Physician Assistant

## 2020-12-29 DIAGNOSIS — E785 Hyperlipidemia, unspecified: Secondary | ICD-10-CM

## 2020-12-29 DIAGNOSIS — L299 Pruritus, unspecified: Secondary | ICD-10-CM

## 2021-01-30 ENCOUNTER — Other Ambulatory Visit: Payer: Self-pay | Admitting: Physician Assistant

## 2021-01-30 DIAGNOSIS — E1142 Type 2 diabetes mellitus with diabetic polyneuropathy: Secondary | ICD-10-CM

## 2021-02-08 ENCOUNTER — Other Ambulatory Visit: Payer: Self-pay | Admitting: Internal Medicine

## 2021-02-08 ENCOUNTER — Other Ambulatory Visit: Payer: Self-pay | Admitting: Neurology

## 2021-02-08 ENCOUNTER — Other Ambulatory Visit: Payer: Self-pay | Admitting: Physician Assistant

## 2021-02-08 DIAGNOSIS — L309 Dermatitis, unspecified: Secondary | ICD-10-CM

## 2021-02-08 DIAGNOSIS — G40909 Epilepsy, unspecified, not intractable, without status epilepticus: Secondary | ICD-10-CM

## 2021-02-08 DIAGNOSIS — K279 Peptic ulcer, site unspecified, unspecified as acute or chronic, without hemorrhage or perforation: Secondary | ICD-10-CM

## 2021-02-08 DIAGNOSIS — E119 Type 2 diabetes mellitus without complications: Secondary | ICD-10-CM

## 2021-02-08 DIAGNOSIS — E785 Hyperlipidemia, unspecified: Secondary | ICD-10-CM

## 2021-02-09 ENCOUNTER — Encounter: Payer: Self-pay | Admitting: Internal Medicine

## 2021-02-09 ENCOUNTER — Ambulatory Visit: Payer: No Typology Code available for payment source | Attending: Internal Medicine | Admitting: Internal Medicine

## 2021-02-09 ENCOUNTER — Other Ambulatory Visit: Payer: Self-pay

## 2021-02-09 VITALS — BP 119/87 | HR 106 | Resp 16 | Wt 165.8 lb

## 2021-02-09 DIAGNOSIS — I10 Essential (primary) hypertension: Secondary | ICD-10-CM

## 2021-02-09 DIAGNOSIS — E785 Hyperlipidemia, unspecified: Secondary | ICD-10-CM

## 2021-02-09 DIAGNOSIS — L309 Dermatitis, unspecified: Secondary | ICD-10-CM

## 2021-02-09 DIAGNOSIS — Z23 Encounter for immunization: Secondary | ICD-10-CM | POA: Diagnosis not present

## 2021-02-09 DIAGNOSIS — E1142 Type 2 diabetes mellitus with diabetic polyneuropathy: Secondary | ICD-10-CM | POA: Diagnosis not present

## 2021-02-09 DIAGNOSIS — F321 Major depressive disorder, single episode, moderate: Secondary | ICD-10-CM

## 2021-02-09 DIAGNOSIS — G40909 Epilepsy, unspecified, not intractable, without status epilepticus: Secondary | ICD-10-CM

## 2021-02-09 LAB — GLUCOSE, POCT (MANUAL RESULT ENTRY): POC Glucose: 409 mg/dl — AB (ref 70–99)

## 2021-02-09 LAB — POCT GLYCOSYLATED HEMOGLOBIN (HGB A1C): HbA1c, POC (controlled diabetic range): 13.6 % — AB (ref 0.0–7.0)

## 2021-02-09 MED ORDER — LANTUS SOLOSTAR 100 UNIT/ML ~~LOC~~ SOPN: 10.0000 [IU] | PEN_INJECTOR | Freq: Every day | SUBCUTANEOUS | 4 refills | Status: DC

## 2021-02-09 MED ORDER — PEN NEEDLES 31G X 8 MM MISC: 6 refills | Status: DC

## 2021-02-09 MED ORDER — METFORMIN HCL 500 MG PO TABS: 500.0000 mg | ORAL_TABLET | Freq: Every day | ORAL | 3 refills | Status: DC

## 2021-02-09 NOTE — Telephone Encounter (Signed)
Requested Prescriptions  Pending Prescriptions Disp Refills  . pantoprazole (PROTONIX) 40 MG tablet [Pharmacy Med Name: PANTOPRAZOLE SOD DR 40 MG TAB] 180 tablet 0    Sig: TAKE 1 TABLET BY MOUTH TWICE A DAY     Gastroenterology: Proton Pump Inhibitors Passed - 02/08/2021  3:06 PM      Passed - Valid encounter within last 12 months    Recent Outpatient Visits          10 months ago Type 2 diabetes mellitus with diabetic polyneuropathy, without long-term current use of insulin ([place])   [place] Ladell Pier, MD   1 year ago Need for influenza vaccination   [place], RPH-CPP   1 year ago Rectal bleeding   [place], Deborah B, MD   1 year ago Essential hypertension   [place], RPH-CPP   1 year ago Elevated blood pressure reading   [place], RPH-CPP      Future Appointments            Today Ladell Pier, MD [place]           . glimepiride (AMARYL) 2 MG tablet [Pharmacy Med Name: GLIMEPIRIDE 2 MG TABLET] 30 tablet 0    Sig: TAKE 1 TABLET BY MOUTH [place]     Endocrinology:  Diabetes - Sulfonylureas Failed - 02/08/2021  3:06 PM      Failed - HBA1C is between 0 and 7.9 and within 180 days    HbA1c, POC (controlled diabetic range)  Date Value Ref Range Status  03/18/2020 7.5 (A) 0.0 - 7.0 % Final         Failed - Valid encounter within last 6 months    Recent Outpatient Visits          10 months ago Type 2 diabetes mellitus with diabetic polyneuropathy, without long-term current use of insulin ([place])   [place] Ladell Pier, MD   1 year ago Need for influenza vaccination   [place], Jarome Matin,  RPH-CPP   1 year ago Rectal bleeding   [place], Deborah B, MD   1 year ago Essential hypertension   [place], Jarome Matin, RPH-CPP   1 year ago Elevated blood pressure reading   [place], RPH-CPP      Future Appointments            Today Ladell Pier, MD [place]           . atorvastatin (LIPITOR) 20 MG tablet [Pharmacy Med Name: ATORVASTATIN 20 MG TABLET] 90 tablet 0    Sig: TAKE 1 TABLET BY MOUTH EVERY DAY     Cardiovascular:  Antilipid - Statins Failed - 02/08/2021  3:06 PM      Failed - LDL in normal range and within 360 days    LDL Chol Calc (NIH)  Date Value Ref Range Status  03/18/2020 108 (H) 0 - 99 mg/dL Final         Passed - Total Cholesterol in normal range and within 360 days    Cholesterol,  Total  Date Value Ref Range Status  03/18/2020 169 100 - 199 mg/dL Final         Passed - HDL in normal range and within 360 days    HDL  Date Value Ref Range Status  03/18/2020 41 >39 mg/dL Final         Passed - Triglycerides in normal range and within 360 days    Triglycerides  Date Value Ref Range Status  03/18/2020 112 0 - 149 mg/dL Final         Passed - Patient is not pregnant      Passed - Valid encounter within last 12 months    Recent Outpatient Visits          10 months ago Type 2 diabetes mellitus with diabetic polyneuropathy, without long-term current use of insulin ([place])   [place] Ladell Pier, MD   1 year ago Need for influenza vaccination   [place], RPH-CPP   1 year ago Rectal bleeding   [place], Deborah B, MD   1 year ago Essential hypertension   [place], RPH-CPP   1 year  ago Elevated blood pressure reading   [place], Jarome Matin, RPH-CPP      Future Appointments            Today Ladell Pier, MD [place]

## 2021-02-09 NOTE — Progress Notes (Signed)
Patient ID: [name], male    DOB: 16-Oct-1970  MRN: [id number]  CC: Medication Refill   Subjective: [name] is a 50 y.o. male who presents for chronic ds management. Sister is with him and gives most of the hx His concerns today include:  Pt with hx of DM type 2, Sz disorder, PUD (GIB 04/2019), HTN, fatty liver, HL, major cognitive disorder (patient had neuropsychiatric evaluation done by Dr. Alphonzo Severance 11/2019).  DM: Results for orders placed or performed in visit on 02/09/21  POCT glucose (manual entry)  Result Value Ref Range   POC Glucose 409 (A) 70 - 99 mg/dl  POCT glycosylated hemoglobin (Hb A1C)  Result Value Ref Range   Hemoglobin A1C     HbA1c POC (<> result, manual entry)     HbA1c, POC (prediabetic range)     HbA1c, POC (controlled diabetic range) 13.6 (A) 0.0 - 7.0 %   Pt has been drinking regular sodas and eating sweet snacks Denies polyuria/polydipsia.  Denies blurred vision Taking Amaryl consistently but not checking BS regularly  HTN: He was on blood pressure medicine in the past but we were able to take him off of it and has been observing without medication.  Sister reports that he has been in drinking drinks with sodium in it.  Patient denies any chest pains or shortness of breath.  No lower extremity edema.    HL: Taking and tolerating atorvastatin.  SZ: no sz since last visit.  Followed by Dr. Delice Lesch whom he last saw in August of this year.  She noted that he continues to have cognitive and behavioral changes and that there is likely a strong component of mood contributing to his symptoms.  She referred him to behavioral health.  He did get a call but they wanted to do a virtual visit and his sister felt that was not ideal.  She would like for him to be referred somewhere where he can be seen in person.  He is still taking Lexapro.  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  He saw the dermatologist  at [facility].  His sister would like for them to be referred elsewhere as she felt the doctor was a bit rude and it took 2 weeks for them to call in the prescription that he had recommended.  Still no improvement.  Patient continues to scratch.   Patient Active Problem List   Diagnosis Date Noted   Neurocognitive disorder 03/18/2020   Elevated blood pressure reading 08/24/2019   Memory changes 05/25/2019   Non-traumatic rhabdomyolysis    Acute GI bleeding 04/21/2019   Diabetes mellitus type 2 in nonobese ([place]) 04/21/2019   Seizures ([place]) 04/19/2019   Acute blood loss anemia    Duodenal ulcer hemorrhage    Acute gastric ulcer without hemorrhage or perforation    Acute renal failure (ARF) ([place]) 01/23/5101   Acute metabolic encephalopathy 58/52/7782     Current Outpatient Medications on File Prior to Visit  Medication Sig Dispense Refill   Accu-Chek FastClix Lancets MISC UAD 100 each 0   acetaminophen (TYLENOL) 500 MG tablet Take 1 tablet (500 mg total) by mouth every 6 (six) hours as needed for mild pain, moderate pain, fever or headache.     atorvastatin (LIPITOR) 20 MG tablet TAKE 1 TABLET BY MOUTH EVERY DAY 90 tablet 0   Blood Glucose Monitoring Suppl (ACCU-CHEK GUIDE ME) w/Device KIT USE AS  DIRECTED 1 kit 0   fexofenadine (ALLEGRA) 180 MG tablet Take 180 mg by mouth daily as needed for allergies or rhinitis.     glimepiride (AMARYL) 2 MG tablet TAKE 1 TABLET BY MOUTH EVERY DAY BEFORE BREAKFAST 30 tablet 0   glucose blood (ACCU-CHEK GUIDE) test strip Use as instructed 100 each 12   hydrOXYzine (ATARAX/VISTARIL) 10 MG tablet TAKE 1 TABLET BY MOUTH THREE TIMES A DAY AS NEEDED 30 tablet 0   levETIRAcetam (KEPPRA) 500 MG tablet TAKE 1/2 TAB IN AM, 1 TAB IN PM 135 tablet 0   pantoprazole (PROTONIX) 40 MG tablet TAKE 1 TABLET BY MOUTH TWICE A DAY 180 tablet 0   triamcinolone cream (KENALOG) 0.1 % APPLY TO AFFECTED AREA TWICE A DAY 80 g 1   escitalopram (LEXAPRO) 20 MG tablet  Take 1 tablet every night (Patient not taking: Reported on 02/09/2021) 90 tablet 3   No current facility-administered medications on file prior to visit.    Allergies  Allergen Reactions   Penicillins Hives and Other (See Comments)    Childhood allergy  Did it involve swelling of the face/tongue/throat, SOB, or low BP? Unknown Did it involve sudden or severe rash/hives, skin peeling, or any reaction on the inside of your mouth or nose? Unknown Did you need to seek medical attention at a hospital or doctor's office? Unknown When did it last happen?      Pt was a child If all above answers are "NO", may proceed with cephalosporin use. Sister states he just breaks out in hives unsure about other reactions.      Social History   Socioeconomic History   Marital status: Single    Spouse name: Not on file   Number of children: Not on file   Years of education: Not on file   Highest education level: Not on file  Occupational History   Not on file  Tobacco Use   Smoking status: Never   Smokeless tobacco: Never  Vaping Use   Vaping Use: Never used  Substance and Sexual Activity   Alcohol use: Not Currently   Drug use: Not Currently    Comment: 5 times per month (per 04/21/19 hospital note)   Sexual activity: Not on file  Other Topics Concern   Not on file  Social History Narrative   Left handed   Drinks caffeine   Single story home   Social Determinants of Health   Financial Resource Strain: Not on file  Food Insecurity: Not on file  Transportation Needs: Not on file  Physical Activity: Not on file  Stress: Not on file  Social Connections: Not on file  Intimate Partner Violence: Not on file    Family History  Problem Relation Age of Onset   Diabetes Mother    Diabetes Sister    Diabetes Father    Esophageal cancer Neg Hx    Stomach cancer Neg Hx    Colon cancer Neg Hx    Pancreatic cancer Neg Hx     Past Surgical History:  Procedure Laterality Date   BIOPSY   04/17/2019   Procedure: BIOPSY;  Surgeon: Irene Shipper, MD;  Location: [place];  Service: Endoscopy;;   ESOPHAGOGASTRODUODENOSCOPY Left 04/21/2019   Procedure: ESOPHAGOGASTRODUODENOSCOPY (EGD);  Surgeon: Jerene Bears, MD;  Location: [facility] ENDOSCOPY;  Service: Gastroenterology;  Laterality: Left;   ESOPHAGOGASTRODUODENOSCOPY (EGD) WITH PROPOFOL N/A 04/17/2019   Procedure: ESOPHAGOGASTRODUODENOSCOPY (EGD) WITH PROPOFOL;  Surgeon: Irene Shipper, MD;  Location: [place];  Service: Endoscopy;  Laterality: N/A;   ESOPHAGOGASTRODUODENOSCOPY (EGD) WITH PROPOFOL N/A 04/23/2019   Procedure: ESOPHAGOGASTRODUODENOSCOPY (EGD) WITH PROPOFOL;  Surgeon: Jerene Bears, MD;  Location: [facility] ENDOSCOPY;  Service: Gastroenterology;  Laterality: N/A;   HEMOSTASIS CLIP PLACEMENT  04/17/2019   Procedure: HEMOSTASIS CLIP PLACEMENT;  Surgeon: Irene Shipper, MD;  Location: [facility] ENDOSCOPY;  Service: Endoscopy;;   HEMOSTASIS CLIP PLACEMENT  04/21/2019   Procedure: HEMOSTASIS CLIP PLACEMENT;  Surgeon: Jerene Bears, MD;  Location: [place] ENDOSCOPY;  Service: Gastroenterology;;   IR FLUORO GUIDE CV LINE RIGHT  04/10/2019   IR REMOVAL TUN CV CATH W/O FL  04/17/2019   IR US GUIDE VASC ACCESS RIGHT  04/10/2019   SUBMUCOSAL INJECTION  04/17/2019   Procedure: SUBMUCOSAL INJECTION;  Surgeon: Irene Shipper, MD;  Location: [facility] ENDOSCOPY;  Service: Endoscopy;;    ROS: Review of Systems Negative except as stated above  PHYSICAL EXAM: BP 119/87   Pulse (!) 106   Resp 16   Wt 165 lb 12.8 oz (75.2 kg)   SpO2 97%   BMI 23.79 kg/m   Physical Exam   General appearance -patient in NAD. Mental status -patient very withdrawn with poor eye contact.  He sits with his head hung down. Mouth - mucous membranes moist, pharynx normal without lesions Neck - supple, no significant adenopathy Chest - clear to auscultation, no wheezes, rales or rhonchi, symmetric air entry Heart - normal rate, regular rhythm, normal S1, S2, no murmurs, rubs, clicks or  gallops Extremities - peripheral pulses normal, no pedal edema, no clubbing or cyanosis Skin -some scarring and hyperpigmented changes of the skin on the upper anterior chest and upper arms.  Depression screen [facility] 2/9 02/09/2021 11/01/2020 03/18/2020  Decreased Interest 3 3 3   Down, Depressed, Hopeless 2 2 1   PHQ - 2 Score 5 5 4   Altered sleeping 2 3 3   Tired, decreased energy 2 3 2   Change in appetite 2 3 2   Feeling bad or failure about yourself  - 0 0  Trouble concentrating 3 3 3   Moving slowly or fidgety/restless 1 3 -  Suicidal thoughts 0 0 0  PHQ-9 Score 15 20 14     CMP Latest Ref Rng & Units 10/10/2019 10/09/2019 04/30/2019  Glucose 70 - 99 mg/dL 189(H) 85 66  BUN 6 - 20 mg/dL 17 18 12   Creatinine 0.61 - 1.24 mg/dL 1.04 0.93 1.17  Sodium 135 - 145 mmol/L 138 139 138  Potassium 3.5 - 5.1 mmol/L 3.6 4.0 4.8  Chloride 98 - 111 mmol/L 104 105 102  CO2 22 - 32 mmol/L 23 25 21   Calcium 8.9 - 10.3 mg/dL 8.6(L) 9.6 8.8  Total Protein 6.5 - 8.1 g/dL 5.8(L) 6.8 6.2  Total Bilirubin 0.3 - 1.2 mg/dL 0.3 0.6 0.3  Alkaline Phos 38 - 126 U/L 64 73 82  AST 15 - 41 U/L 24 26 35  ALT 0 - 44 U/L 37 41 44   Lipid Panel     Component Value Date/Time   CHOL 169 03/18/2020 1157   TRIG 112 03/18/2020 1157   HDL 41 03/18/2020 1157   CHOLHDL 4.1 03/18/2020 1157   LDLCALC 108 (H) 03/18/2020 1157    CBC    Component Value Date/Time   WBC 6.7 03/18/2020 1157   WBC 7.3 10/10/2019 0027   RBC 5.53 03/18/2020 1157   RBC 4.98 10/10/2019 0027   HGB 14.3 03/18/2020 1157   HCT 44.4 03/18/2020 1157   PLT 265 03/18/2020 1157  MCV 80 03/18/2020 1157   MCH 25.9 (L) 03/18/2020 1157   MCH 25.3 (L) 10/10/2019 0027   MCHC 32.2 03/18/2020 1157   MCHC 32.2 10/10/2019 0027   RDW 12.9 03/18/2020 1157   LYMPHSABS 2.1 10/09/2019 1304   MONOABS 0.7 10/09/2019 1304   EOSABS 0.9 (H) 10/09/2019 1304   BASOSABS 0.1 10/09/2019 1304    ASSESSMENT AND PLAN: 1. Type 2 diabetes mellitus with diabetic  polyneuropathy, without long-term current use of insulin ([place]) Not at goal and likely due to dietary indiscretions.  I had a long talk with the patient about the need to change his eating habits. Continue Amaryl.  Start evening dose of Lantus 10 units daily and metformin daily.  Sister will try to check his blood sugars at least once a day and send me the readings in about 2 to 3 weeks.  We went over blood sugar goals being 90-130 before meals. - POCT glucose (manual entry) - POCT glycosylated hemoglobin (Hb A1C) - Ambulatory referral to Ophthalmology - insulin glargine (LANTUS SOLOSTAR) 100 UNIT/ML Solostar Pen; Inject 10 Units into the skin at bedtime.  Dispense: 15 mL; Refill: 4 - metFORMIN (GLUCOPHAGE) 500 MG tablet; Take 1 tablet (500 mg total) by mouth daily with breakfast.  Dispense: 90 tablet; Refill: 3 - Insulin Pen Needle (PEN NEEDLES) 31G X 8 MM MISC; UAD  Dispense: 100 each; Refill: 6 - CBC - Comprehensive metabolic panel - Lipid panel - Microalbumin / creatinine urine ratio  2. Essential hypertension Diastolic blood pressure not at goal.  DASH diet discussed and encouraged.  Sister will check his blood pressure once to twice a week and send me the readings in a few weeks.  3. Hyperlipidemia, unspecified hyperlipidemia type Continue atorvastatin.  4. Seizure disorder ([place]) He remains on Keppra.  5. Moderate major depression ([place]) Referral submitted to behavioral health  6. 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  I recommend that he wears cloth gloves at nights so that he can avoid scratching - Ambulatory referral to Dermatology  8. Need for Streptococcus pneumoniae vaccination We are out of the Prevnar 20.  We will plan to give him this vaccine on his next visit.     Patient was given the opportunity to ask questions.  Patient verbalized understanding of  the plan and was able to repeat key elements of the plan.   Orders Placed This Encounter  Procedures   Flu Vaccine QUAD 666moM (Fluarix, Fluzone & Alfiuria Quad PF)   CBC   Comprehensive metabolic panel   Lipid panel   Microalbumin / creatinine urine ratio   Ambulatory referral to Ophthalmology   Ambulatory referral to Dermatology   POCT glucose (manual entry)   POCT glycosylated hemoglobin (Hb A1C)     Requested Prescriptions   Signed Prescriptions Disp Refills   insulin glargine (LANTUS SOLOSTAR) 100 UNIT/ML Solostar Pen 15 mL 4    Sig: Inject 10 Units into the skin at bedtime.   metFORMIN (GLUCOPHAGE) 500 MG tablet 90 tablet 3    Sig: Take 1 tablet (500 mg total) by mouth daily with breakfast.   Insulin Pen Needle (PEN NEEDLES) 31G X 8 MM MISC 100 each 6    Sig: UAD    Return in about 3 months (around 05/12/2021).  DeKarle PlumberMD, FACP

## 2021-02-09 NOTE — Patient Instructions (Addendum)
Start Lantus insulin 10 units t bedtime. Start Metformin 500 mg daily. Check blood sugars daily before breakfast with goal being 90-130.   Cut back on salty foods.  Healthy Eating Following a healthy eating pattern may help you to achieve and maintain a healthy body weight, reduce the risk of chronic disease, and live a long and productive life. It is important to follow a healthy eating pattern at an appropriate calorie level for your body. Your nutritional needs should be met primarily through food by choosing a variety of nutrient-rich foods. What are tips for following this plan? Reading food labels Read labels and choose the following: Reduced or low sodium. Juices with 100% fruit juice. Foods with low saturated fats and high polyunsaturated and monounsaturated fats. Foods with whole grains, such as whole wheat, cracked wheat, brown rice, and wild rice. Whole grains that are fortified with folic acid. This is recommended for women who are pregnant or who want to become pregnant. Read labels and avoid the following: Foods with a lot of added sugars. These include foods that contain brown sugar, corn sweetener, corn syrup, dextrose, fructose, glucose, high-fructose corn syrup, honey, invert sugar, lactose, malt syrup, maltose, molasses, raw sugar, sucrose, trehalose, or turbinado sugar. Do not eat more than the following amounts of added sugar per day: 6 teaspoons (25 g) for women. 9 teaspoons (38 g) for men. Foods that contain processed or refined starches and grains. Refined grain products, such as white flour, degermed cornmeal, white bread, and white rice. Shopping Choose nutrient-rich snacks, such as vegetables, whole fruits, and nuts. Avoid high-calorie and high-sugar snacks, such as potato chips, fruit snacks, and candy. Use oil-based dressings and spreads on foods instead of solid fats such as butter, stick margarine, or cream cheese. Limit pre-made sauces, mixes, and "instant"  products such as flavored rice, instant noodles, and ready-made pasta. Try more plant-protein sources, such as tofu, tempeh, black beans, edamame, lentils, nuts, and seeds. Explore eating plans such as the Mediterranean diet or vegetarian diet. Cooking Use oil to saut or stir-fry foods instead of solid fats such as butter, stick margarine, or lard. Try baking, boiling, grilling, or broiling instead of frying. Remove the fatty part of meats before cooking. Steam vegetables in water or broth. Meal planning  At meals, imagine dividing your plate into fourths: One-half of your plate is fruits and vegetables. One-fourth of your plate is whole grains. One-fourth of your plate is protein, especially lean meats, poultry, eggs, tofu, beans, or nuts. Include low-fat dairy as part of your daily diet. Lifestyle Choose healthy options in all settings, including home, work, school, restaurants, or stores. Prepare your food safely: Wash your hands after handling raw meats. Keep food preparation surfaces clean by regularly washing with hot, soapy water. Keep raw meats separate from ready-to-eat foods, such as fruits and vegetables. Cook seafood, meat, poultry, and eggs to the recommended internal temperature. Store foods at safe temperatures. In general: Keep cold foods at 19F (4.4C) or below. Keep hot foods at 119F (60C) or above. Keep your freezer at [facility] (-17.8C) or below. Foods are no longer safe to eat when they have been between the temperatures of 40-119F (4.4-60C) for more than 2 hours. What foods should I eat? Fruits Aim to eat 2 cup-equivalents of fresh, canned (in natural juice), or frozen fruits each day. Examples of 1 cup-equivalent of fruit include 1 small apple, 8 large strawberries, 1 cup canned fruit,  cup dried fruit, or 1 cup 100% juice. Vegetables  Aim to eat 2-3 cup-equivalents of fresh and frozen vegetables each day, including different varieties and colors. Examples of  1 cup-equivalent of vegetables include 2 medium carrots, 2 cups raw, leafy greens, 1 cup chopped vegetable (raw or cooked), or 1 medium baked potato. Grains Aim to eat 6 ounce-equivalents of whole grains each day. Examples of 1 ounce-equivalent of grains include 1 slice of bread, 1 cup ready-to-eat cereal, 3 cups popcorn, or  cup cooked rice, pasta, or cereal. Meats and other proteins Aim to eat 5-6 ounce-equivalents of protein each day. Examples of 1 ounce-equivalent of protein include 1 egg, 1/2 cup nuts or seeds, or 1 tablespoon (16 g) peanut butter. A cut of meat or fish that is the size of a deck of cards is about 3-4 ounce-equivalents. Of the protein you eat each week, try to have at least 8 ounces come from seafood. This includes salmon, trout, herring, and anchovies. Dairy Aim to eat 3 cup-equivalents of fat-free or low-fat dairy each day. Examples of 1 cup-equivalent of dairy include 1 cup (240 mL) milk, 8 ounces (250 g) yogurt, 1 ounces (44 g) natural cheese, or 1 cup (240 mL) fortified soy milk. Fats and oils Aim for about 5 teaspoons (21 g) per day. Choose monounsaturated fats, such as canola and olive oils, avocados, peanut butter, and most nuts, or polyunsaturated fats, such as sunflower, corn, and soybean oils, walnuts, pine nuts, sesame seeds, sunflower seeds, and flaxseed. Beverages Aim for six 8-oz glasses of water per day. Limit coffee to three to five 8-oz cups per day. Limit caffeinated beverages that have added calories, such as soda and energy drinks. Limit alcohol intake to no more than 1 drink a day for nonpregnant women and 2 drinks a day for men. One drink equals 12 oz of beer (355 mL), 5 oz of wine (148 mL), or 1 oz of hard liquor (44 mL). Seasoning and other foods Avoid adding excess amounts of salt to your foods. Try flavoring foods with herbs and spices instead of salt. Avoid adding sugar to foods. Try using oil-based dressings, sauces, and spreads instead of solid  fats. This information is based on general [place] nutrition guidelines. For more information, visit [employer].es. Exact amounts may vary based on your nutrition needs. Summary A healthy eating plan may help you to maintain a healthy weight, reduce the risk of chronic diseases, and stay active throughout your life. Plan your meals. Make sure you eat the right portions of a variety of nutrient-rich foods. Try baking, boiling, grilling, or broiling instead of frying. Choose healthy options in all settings, including home, work, school, restaurants, or stores. This information is not intended to replace advice given to you by your health care provider. Make sure you discuss any questions you have with your health care provider. Document Revised: 07/08/2017 Document Reviewed: 07/08/2017 Elsevier Patient Education  [place].

## 2021-02-10 NOTE — Addendum Note (Signed)
Addended by: Boykin Reaper R on: 02/10/2021 02:11 PM   Modules accepted: Orders

## 2021-02-11 LAB — COMPREHENSIVE METABOLIC PANEL
ALT: 25 IU/L (ref 0–44)
AST: 23 IU/L (ref 0–40)
Albumin/Globulin Ratio: 2 (ref 1.2–2.2)
Albumin: 4.3 g/dL (ref 3.8–4.9)
Alkaline Phosphatase: 106 IU/L (ref 44–121)
BUN/Creatinine Ratio: 7 — ABNORMAL LOW (ref 9–20)
BUN: 7 mg/dL (ref 6–24)
Bilirubin Total: 0.5 mg/dL (ref 0.0–1.2)
CO2: 26 mmol/L (ref 20–29)
Calcium: 9.3 mg/dL (ref 8.7–10.2)
Chloride: 92 mmol/L — ABNORMAL LOW (ref 96–106)
Creatinine, Ser: 0.96 mg/dL (ref 0.76–1.27)
Globulin, Total: 2.2 g/dL (ref 1.5–4.5)
Glucose: 417 mg/dL — ABNORMAL HIGH (ref 70–99)
Potassium: 4.4 mmol/L (ref 3.5–5.2)
Sodium: 132 mmol/L — ABNORMAL LOW (ref 134–144)
Total Protein: 6.5 g/dL (ref 6.0–8.5)
eGFR: 96 mL/min/{1.73_m2} (ref >59)

## 2021-02-11 LAB — LIPID PANEL
Chol/HDL Ratio: 5.1 ratio — ABNORMAL HIGH (ref 0.0–5.0)
Cholesterol, Total: 138 mg/dL (ref 100–199)
HDL: 27 mg/dL — ABNORMAL LOW (ref >39)
LDL Chol Calc (NIH): 82 mg/dL (ref 0–99)
Triglycerides: 165 mg/dL — ABNORMAL HIGH (ref 0–149)
VLDL Cholesterol Cal: 29 mg/dL (ref 5–40)

## 2021-02-11 LAB — CBC
Hematocrit: 41.3 % (ref 37.5–51.0)
Hemoglobin: 13.3 g/dL (ref 13.0–17.7)
MCH: 25.7 pg — ABNORMAL LOW (ref 26.6–33.0)
MCHC: 32.2 g/dL (ref 31.5–35.7)
MCV: 80 fL (ref 79–97)
Platelets: 174 10*3/uL (ref 150–450)
RBC: 5.17 x10E6/uL (ref 4.14–5.80)
RDW: 12.1 % (ref 11.6–15.4)
WBC: 5.9 10*3/uL (ref 3.4–10.8)

## 2021-02-11 LAB — MICROALBUMIN / CREATININE URINE RATIO
Creatinine, Urine: 58.6 mg/dL
Microalb/Creat Ratio: 5 mg/g creat (ref 0–29)
Microalbumin, Urine: 3.2 ug/mL

## 2021-02-13 ENCOUNTER — Other Ambulatory Visit: Payer: Self-pay | Admitting: Physician Assistant

## 2021-02-13 DIAGNOSIS — E1142 Type 2 diabetes mellitus with diabetic polyneuropathy: Secondary | ICD-10-CM

## 2021-03-01 ENCOUNTER — Telehealth: Payer: Self-pay | Admitting: Clinical

## 2021-03-01 NOTE — Telephone Encounter (Signed)
I spoke with pt and pt's sister regarding pt's request to PCP for psychiatry. [facility] will refer pt to [facility].

## 2021-03-28 IMAGING — CT CT HEAD W/O CM
4 series · 16 of 47 positions shown, 18 images · non-contrast
Comparison: 04/05/2019

CLINICAL DATA: Cephalopathy

EXAM:
CT HEAD WITHOUT CONTRAST
TECHNIQUE: Contiguous axial images were obtained from the base of the skull
through the vertex without intravenous contrast.

[Series 3: head without · axial · non-contrast · 0.43mm/px · z∈[-163,-43]mm · 7 of 32 slices shown, 9 images]
[im 4/32  brain]
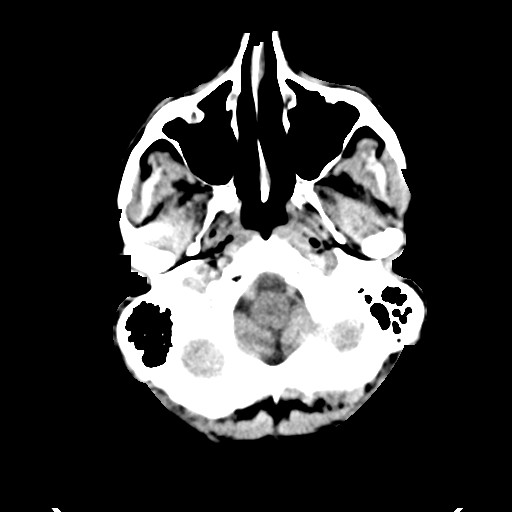
[im 4/32  bone]
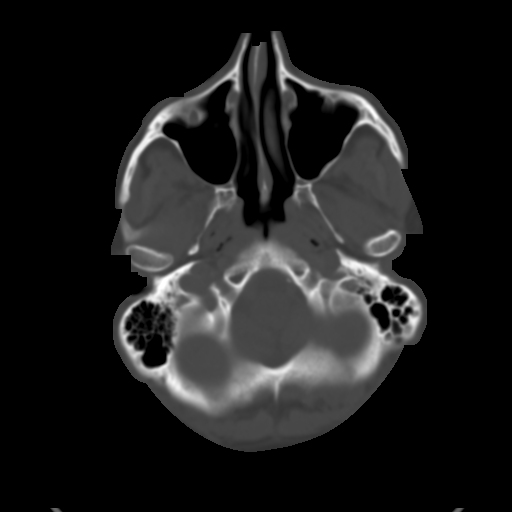
[im 8/32  brain]
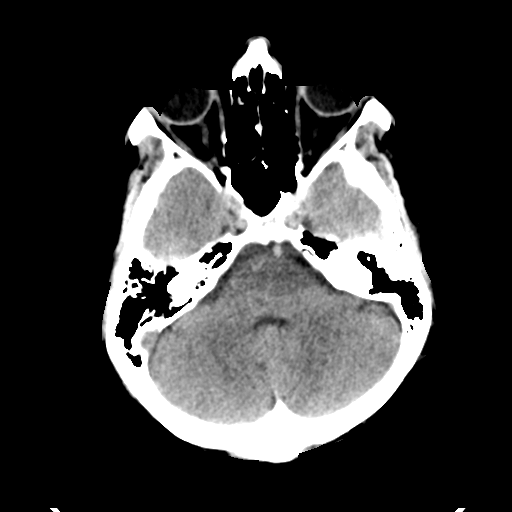
[im 12/32  brain]
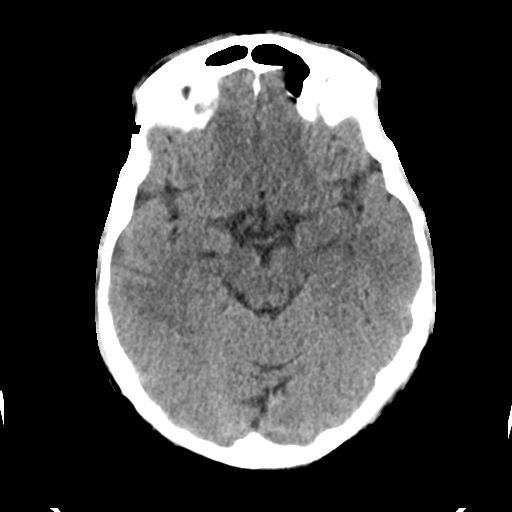
[im 16/32  brain]
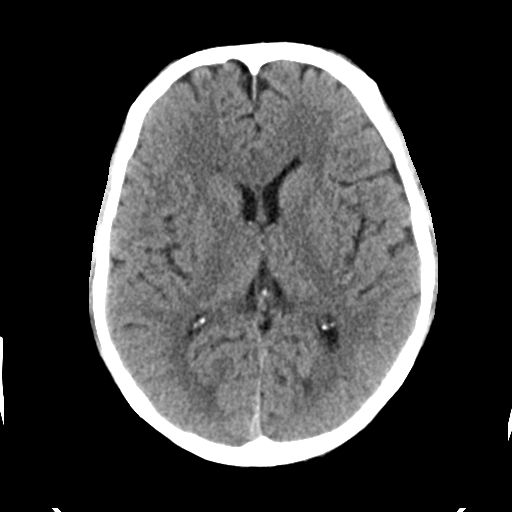
[im 20/32  brain]
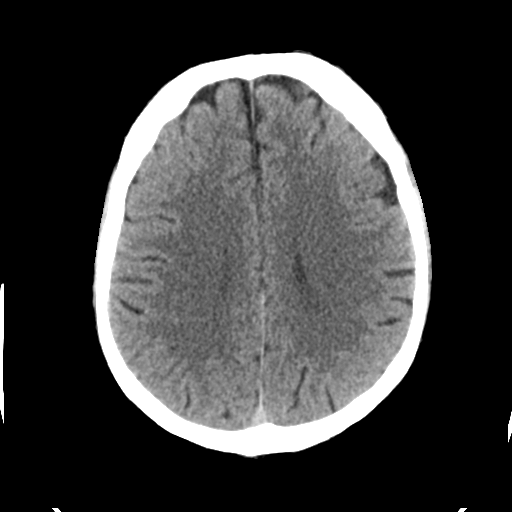
[im 20/32  bone]
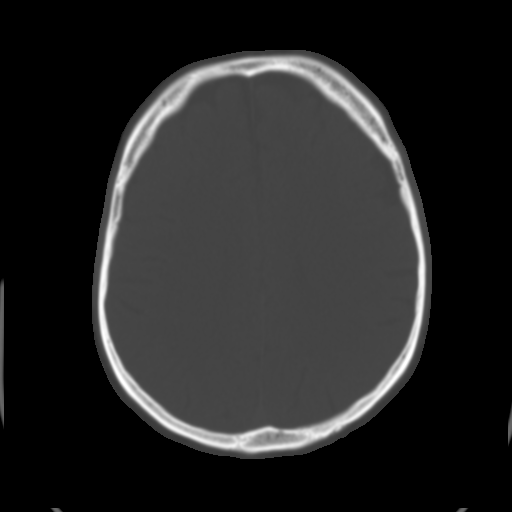
[im 24/32  brain]
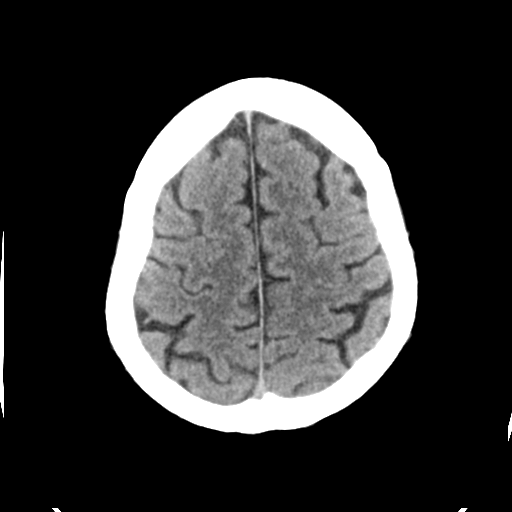
[im 28/32  brain]
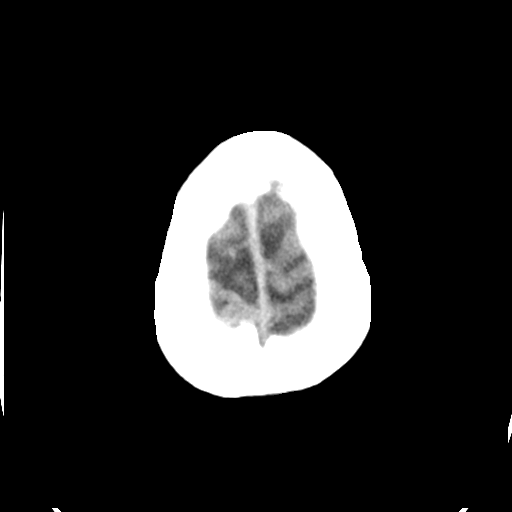

[Series 4: head bone · axial · 0.43mm/px · z∈[-164,-132]mm · 3 of 80 slices shown]
[im 8/80  bone]
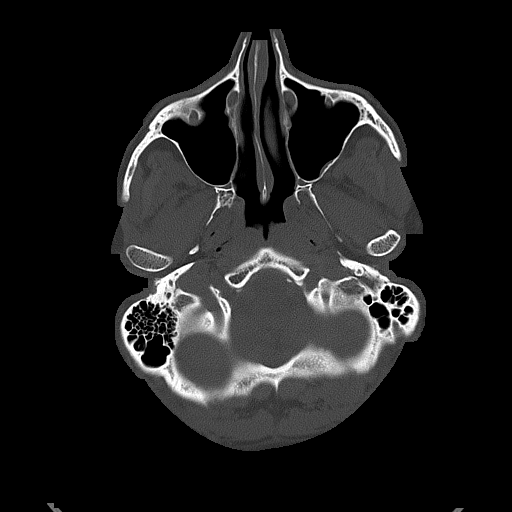
[im 16/80  bone]
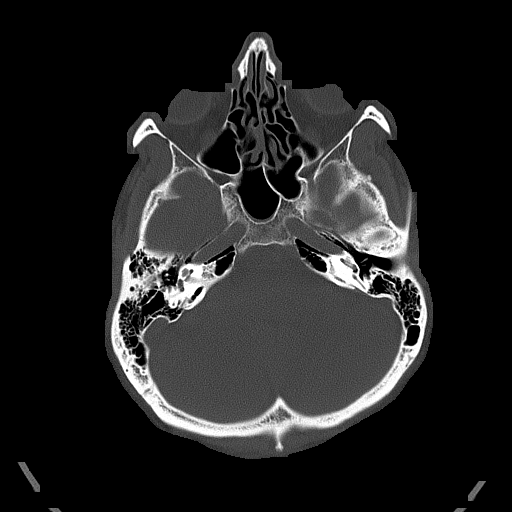
[im 24/80  bone]
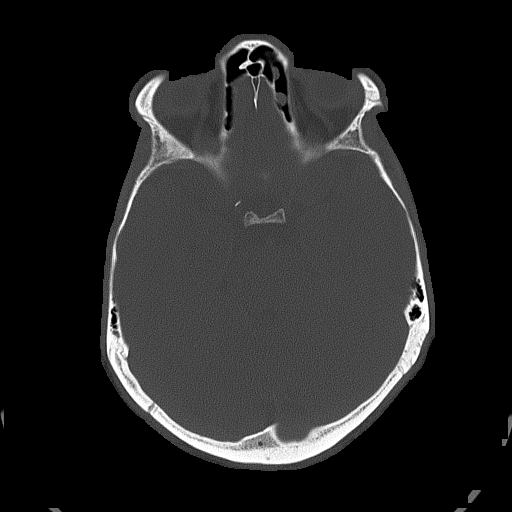

[Series 5: head without cor · coronal · non-contrast · 0.31mm/px · 3 of 71 slices shown]
[im 24/71  brain]
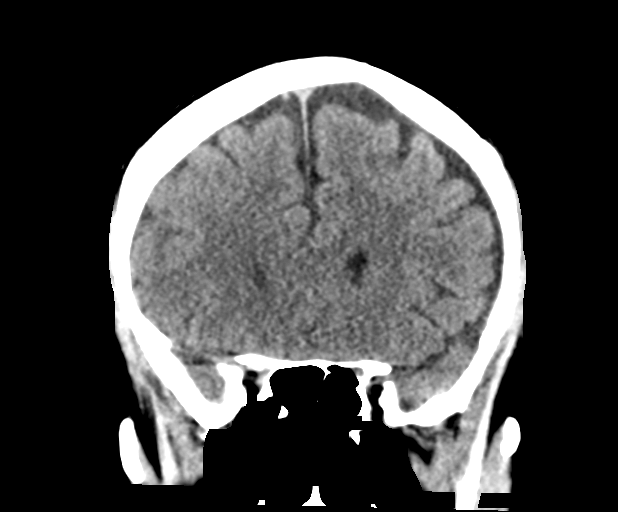
[im 32/71  brain]
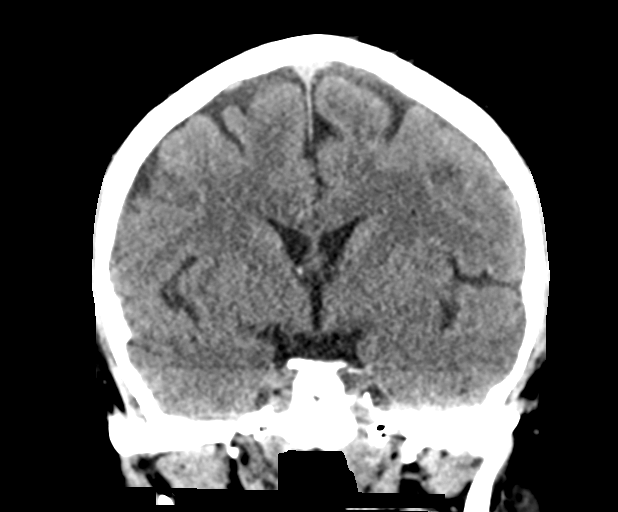
[im 39/71  brain]
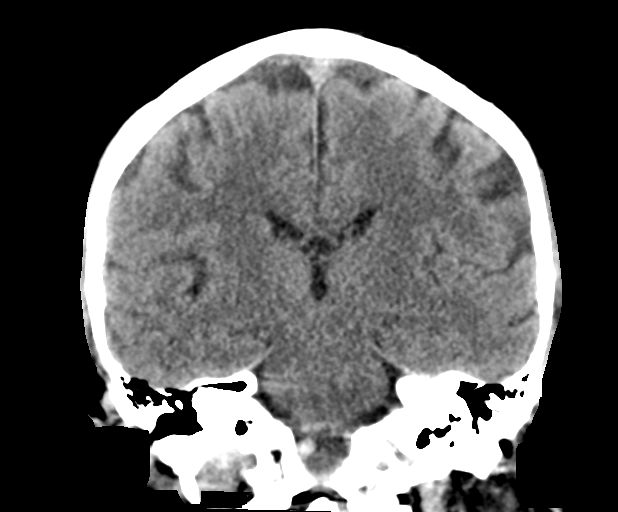

[Series 6: head without sag · sagittal · non-contrast · 0.31mm/px · 3 of 57 slices shown]
[im 19/57  brain]
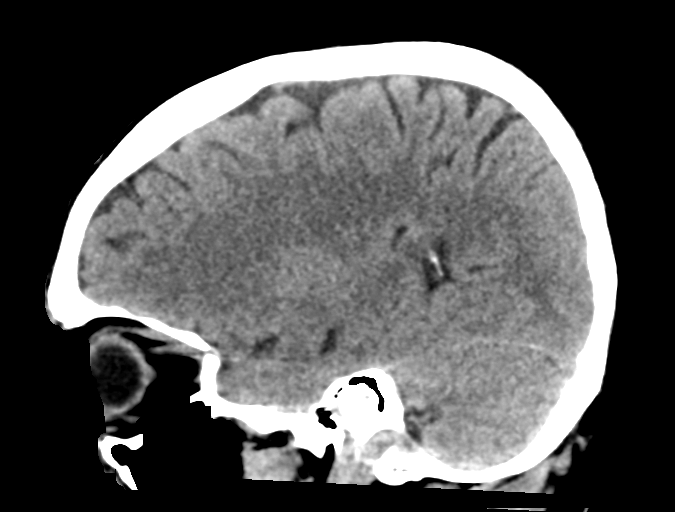
[im 29/57  brain]
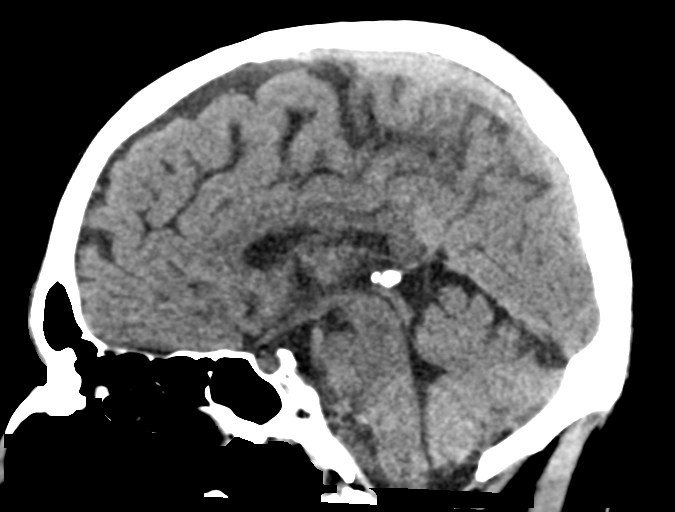
[im 38/57  brain]
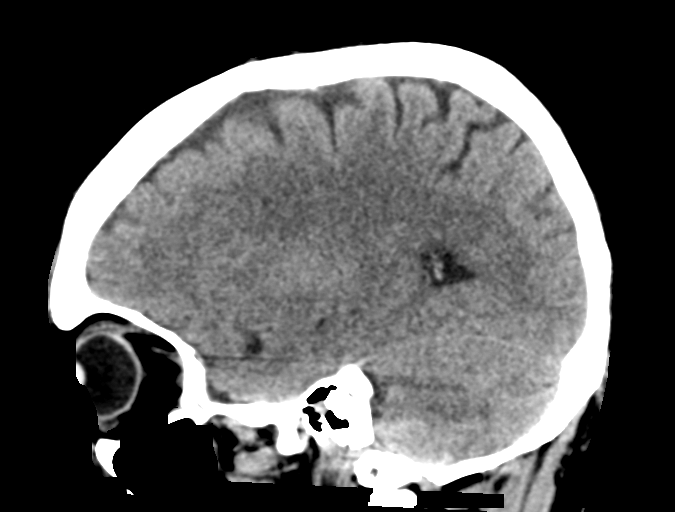

[16 of 47 positions shown; findings below may reference images not displayed]

FINDINGS: Brain: No evidence of acute infarction, hemorrhage, hydrocephalus,
extra-axial collection or mass lesion/mass effect.

Vascular: No hyperdense vessel or unexpected calcification.

Skull: Normal. Negative for fracture or focal lesion.

Sinuses/Orbits: No acute finding.

Other: None.
IMPRESSION: No acute intracranial abnormality is noted. The overall appearance
is similar to that seen on the prior exam.

## 2021-04-02 ENCOUNTER — Other Ambulatory Visit: Payer: Self-pay | Admitting: Internal Medicine

## 2021-04-02 DIAGNOSIS — E119 Type 2 diabetes mellitus without complications: Secondary | ICD-10-CM

## 2021-04-04 NOTE — Telephone Encounter (Signed)
Requested Prescriptions  Pending Prescriptions Disp Refills   glimepiride (AMARYL) 2 MG tablet [Pharmacy Med Name: GLIMEPIRIDE 2 MG TABLET] 30 tablet 0    Sig: TAKE 1 TABLET BY MOUTH EVERY DAY BEFORE BREAKFAST     Endocrinology:  Diabetes - Sulfonylureas Failed - 04/02/2021  9:31 AM      Failed - HBA1C is between 0 and 7.9 and within 180 days    HbA1c, POC (controlled diabetic range)  Date Value Ref Range Status  02/09/2021 13.6 (A) 0.0 - 7.0 % Final         Passed - Valid encounter within last 6 months    Recent Outpatient Visits          1 month ago Type 2 diabetes mellitus with diabetic polyneuropathy, without long-term current use of insulin ([place])   [place], Neoma Laming B, MD   1 year ago Type 2 diabetes mellitus with diabetic polyneuropathy, without long-term current use of insulin ([place])   [place], Deborah B, MD   1 year ago Need for influenza vaccination   [place], Jarome Matin, RPH-CPP   1 year ago Rectal bleeding   [place], Deborah B, MD   1 year ago Essential hypertension   [place], Jarome Matin, RPH-CPP      Future Appointments            In 1 month Wynetta Emery, Dalbert Batman, MD [place]

## 2021-04-11 ENCOUNTER — Other Ambulatory Visit: Payer: Self-pay | Admitting: Physician Assistant

## 2021-04-11 DIAGNOSIS — L309 Dermatitis, unspecified: Secondary | ICD-10-CM

## 2021-04-25 LAB — HM DIABETES EYE EXAM

## 2021-04-28 ENCOUNTER — Other Ambulatory Visit: Payer: Self-pay | Admitting: Internal Medicine

## 2021-04-28 DIAGNOSIS — E119 Type 2 diabetes mellitus without complications: Secondary | ICD-10-CM

## 2021-04-28 NOTE — Telephone Encounter (Signed)
Requested Prescriptions  Pending Prescriptions Disp Refills   glimepiride (AMARYL) 2 MG tablet [Pharmacy Med Name: GLIMEPIRIDE 2 MG TABLET] 30 tablet 0    Sig: TAKE 1 TABLET BY MOUTH EVERY DAY BEFORE BREAKFAST     Endocrinology:  Diabetes - Sulfonylureas Failed - 04/28/2021  8:35 AM      Failed - HBA1C is between 0 and 7.9 and within 180 days    HbA1c, POC (controlled diabetic range)  Date Value Ref Range Status  02/09/2021 13.6 (A) 0.0 - 7.0 % Final         Passed - Valid encounter within last 6 months    Recent Outpatient Visits          2 months ago Type 2 diabetes mellitus with diabetic polyneuropathy, without long-term current use of insulin ([place])   [place], Neoma Laming B, MD   1 year ago Type 2 diabetes mellitus with diabetic polyneuropathy, without long-term current use of insulin ([place])   [place], Deborah B, MD   1 year ago Need for influenza vaccination   [place], Jarome Matin, RPH-CPP   1 year ago Rectal bleeding   [place], Deborah B, MD   1 year ago Essential hypertension   [place], Jarome Matin, RPH-CPP      Future Appointments            In 1 month Wynetta Emery, Dalbert Batman, MD [place]

## 2021-05-01 ENCOUNTER — Encounter: Payer: Self-pay | Admitting: Internal Medicine

## 2021-05-02 ENCOUNTER — Ambulatory Visit: Payer: Medicaid Other | Attending: Internal Medicine | Admitting: Internal Medicine

## 2021-05-02 DIAGNOSIS — Z794 Long term (current) use of insulin: Secondary | ICD-10-CM

## 2021-05-02 DIAGNOSIS — K0889 Other specified disorders of teeth and supporting structures: Secondary | ICD-10-CM | POA: Diagnosis not present

## 2021-05-02 DIAGNOSIS — E1165 Type 2 diabetes mellitus with hyperglycemia: Secondary | ICD-10-CM

## 2021-05-02 DIAGNOSIS — E1142 Type 2 diabetes mellitus with diabetic polyneuropathy: Secondary | ICD-10-CM | POA: Diagnosis not present

## 2021-05-02 MED ORDER — LANTUS SOLOSTAR 100 UNIT/ML ~~LOC~~ SOPN: 26.0000 [IU] | PEN_INJECTOR | Freq: Every day | SUBCUTANEOUS | 4 refills | Status: DC

## 2021-05-02 MED ORDER — METFORMIN HCL 500 MG PO TABS: 500.0000 mg | ORAL_TABLET | Freq: Two times a day (BID) | ORAL | 3 refills | Status: DC

## 2021-05-02 MED ORDER — CLINDAMYCIN HCL 300 MG PO CAPS: 300.0000 mg | ORAL_CAPSULE | Freq: Three times a day (TID) | ORAL | 0 refills | Status: DC

## 2021-05-02 MED ORDER — ACETAMINOPHEN-CODEINE #3 300-30 MG PO TABS: 1.0000 | ORAL_TABLET | Freq: Three times a day (TID) | ORAL | 0 refills | Status: DC | PRN

## 2021-05-02 NOTE — Progress Notes (Signed)
Patient ID: [name], male   DOB: 11-22-1970, 51 y.o.   MRN: [id number] Virtual Visit via Telephone Note  I connected with [name] on 05/02/2021 at 2:03 PM by telephone and verified that I am speaking with the correct person using two identifiers  Location: Patient: home Provider: office  Participants: Myself Patient and his sister    I discussed the limitations, risks, security and privacy concerns of performing an evaluation and management service by telephone and the availability of in person appointments. I also discussed with the patient that there may be a patient responsible charge related to this service. The patient expressed understanding and agreed to proceed.   History of Present Illness: Pt with hx of DM type 2, Sz disorder, PUD (GIB 04/2019), HTN, fatty liver, HL, major cognitive disorder (patient had neuropsychiatric evaluation done by Dr. Alphonzo Severance 11/2019).  This is an UC visit  requested by his sister for his tooth ache   Sister reports pt's BS have been running in 400-500 fasting. Pt waking up at nights to eat sweets like ice cream.  Sister increased Lantus to 20 units from 10 units.  Sister reminds him to take Metformin and Amaryl.  Pt had 11 teeth taken out 1-2 yrs ago.  Has 8 teeth left and all are decayed.  Now has dental pain LT side x 4 days.  Will see dentist this afternoon. Some swelling on the right side.  Sister is requesting an antibiotic and some Tylenol with codeine for him which I had given in the past when he had similar issue.  She states that if the medication is prescribed by the dentist it will not be covered under his Medicaid and he will have to pay out-of-pocket.  Outpatient Encounter Medications as of 05/02/2021  Medication Sig   Accu-Chek Softclix Lancets lancets USE AS DIRECTED   acetaminophen (TYLENOL) 500 MG tablet Take 1 tablet (500 mg total) by mouth every 6 (six) hours as needed for mild pain, moderate pain, fever or headache.    atorvastatin (LIPITOR) 20 MG tablet TAKE 1 TABLET BY MOUTH EVERY DAY   Blood Glucose Monitoring Suppl (ACCU-CHEK GUIDE ME) w/Device KIT USE AS DIRECTED   escitalopram (LEXAPRO) 20 MG tablet Take 1 tablet every night (Patient not taking: Reported on 02/09/2021)   fexofenadine (ALLEGRA) 180 MG tablet Take 180 mg by mouth daily as needed for allergies or rhinitis.   glimepiride (AMARYL) 2 MG tablet TAKE 1 TABLET BY MOUTH EVERY DAY BEFORE BREAKFAST   glucose blood (ACCU-CHEK GUIDE) test strip Use as instructed   hydrOXYzine (ATARAX/VISTARIL) 10 MG tablet TAKE 1 TABLET BY MOUTH THREE TIMES A DAY AS NEEDED   insulin glargine (LANTUS SOLOSTAR) 100 UNIT/ML Solostar Pen Inject 10 Units into the skin at bedtime.   Insulin Pen Needle (PEN NEEDLES) 31G X 8 MM MISC UAD   levETIRAcetam (KEPPRA) 500 MG tablet TAKE 1/2 TAB IN AM, 1 TAB IN PM   metFORMIN (GLUCOPHAGE) 500 MG tablet Take 1 tablet (500 mg total) by mouth daily with breakfast.   pantoprazole (PROTONIX) 40 MG tablet TAKE 1 TABLET BY MOUTH TWICE A DAY   triamcinolone cream (KENALOG) 0.1 % APPLY TO AFFECTED AREA TWICE A DAY   No facility-administered encounter medications on file as of 05/02/2021.    Observations/Objective: No direct observation done as this was a telephone encounter.  Assessment and Plan: 1. Pain, dental Patient will keep his appointment with the dentist today.  He has allergy listed to penicillin on  his chart so I have prescribed some clindamycin instead.  I have prescribed a limited supply of Tylenol with codeine to use as needed.  [place] controlled substance reporting system reviewed. - acetaminophen-codeine (TYLENOL #3) 300-30 MG tablet; Take 1-2 tablets by mouth every 8 (eight) hours as needed for moderate pain.  Dispense: 30 tablet; Refill: 0 - clindamycin (CLEOCIN) 300 MG capsule; Take 1 capsule (300 mg total) by mouth 3 (three) times daily.  Dispense: 21 capsule; Refill: 0  2. Type 2 diabetes mellitus with  hyperglycemia, with long-term current use of insulin ([name]) Counseled patient about the importance of healthy eating habits.  Encouraged him to cut back on sweets snacks.  Advised to increase glargine insulin to 26 units daily and metformin to 500 mg twice a day.  Keep upcoming appointment with me next month. - insulin glargine (LANTUS SOLOSTAR) 100 UNIT/ML Solostar Pen; Inject 26 Units into the skin at bedtime.  Dispense: 15 mL; Refill: 4 - metFORMIN (GLUCOPHAGE) 500 MG tablet; Take 1 tablet (500 mg total) by mouth 2 (two) times daily with a meal.  Dispense: 180 tablet; Refill: 3   Follow Up Instructions: Keep upcoming appt next mth   I discussed the assessment and treatment plan with the patient. The patient was provided an opportunity to ask questions and all were answered. The patient agreed with the plan and demonstrated an understanding of the instructions.   The patient was advised to call back or seek an in-person evaluation if the symptoms worsen or if the condition fails to improve as anticipated.  I  Spent 9 minutes on this telephone encounter  This note has been created with Surveyor, quantity. Any transcriptional errors are unintentional.  Karle Plumber, MD

## 2021-05-03 ENCOUNTER — Encounter: Payer: Self-pay | Admitting: Internal Medicine

## 2021-05-03 ENCOUNTER — Other Ambulatory Visit: Payer: Self-pay | Admitting: Neurology

## 2021-05-03 DIAGNOSIS — G40909 Epilepsy, unspecified, not intractable, without status epilepticus: Secondary | ICD-10-CM

## 2021-05-07 ENCOUNTER — Other Ambulatory Visit: Payer: Self-pay | Admitting: Internal Medicine

## 2021-05-07 DIAGNOSIS — K279 Peptic ulcer, site unspecified, unspecified as acute or chronic, without hemorrhage or perforation: Secondary | ICD-10-CM

## 2021-05-08 NOTE — Telephone Encounter (Signed)
Requested Prescriptions  Pending Prescriptions Disp Refills   pantoprazole (PROTONIX) 40 MG tablet [Pharmacy Med Name: PANTOPRAZOLE SOD DR 40 MG TAB] 180 tablet 0    Sig: TAKE 1 TABLET BY MOUTH TWICE A DAY     Gastroenterology: Proton Pump Inhibitors Passed - 05/07/2021  5:27 PM      Passed - Valid encounter within last 12 months    Recent Outpatient Visits          6 days ago Pain, dental   [place], Neoma Laming B, MD   2 months ago Type 2 diabetes mellitus with diabetic polyneuropathy, without long-term current use of insulin ([place])   [place], Deborah B, MD   1 year ago Type 2 diabetes mellitus with diabetic polyneuropathy, without long-term current use of insulin ([place])   [place] Ladell Pier, MD   1 year ago Need for influenza vaccination   [place], RPH-CPP   1 year ago Rectal bleeding   [place], Deborah B, MD      Future Appointments            In 3 weeks Ladell Pier, MD [place]

## 2021-05-14 ENCOUNTER — Other Ambulatory Visit: Payer: Self-pay | Admitting: Physician Assistant

## 2021-05-14 DIAGNOSIS — E1142 Type 2 diabetes mellitus with diabetic polyneuropathy: Secondary | ICD-10-CM

## 2021-05-15 ENCOUNTER — Ambulatory Visit: Payer: Medicaid Other | Admitting: Neurology

## 2021-05-28 ENCOUNTER — Other Ambulatory Visit: Payer: Self-pay | Admitting: Neurology

## 2021-05-28 DIAGNOSIS — G40909 Epilepsy, unspecified, not intractable, without status epilepticus: Secondary | ICD-10-CM

## 2021-05-29 ENCOUNTER — Ambulatory Visit: Payer: Medicaid Other | Admitting: Internal Medicine

## 2021-06-12 ENCOUNTER — Telehealth: Payer: Self-pay | Admitting: Physician Assistant

## 2021-06-12 DIAGNOSIS — E1142 Type 2 diabetes mellitus with diabetic polyneuropathy: Secondary | ICD-10-CM

## 2021-06-12 MED ORDER — ACCU-CHEK SOFTCLIX LANCETS MISC: 0 refills | Status: AC

## 2021-06-12 NOTE — Telephone Encounter (Signed)
MA sent corrected script to [facility] ?

## 2021-06-12 NOTE — Telephone Encounter (Signed)
Received a fax from [facility] in regards to an RX for [place]. It said "Use as Directed".  The pharmacy is requesting the Frequency. Please advise and I will send the info back to [facility]. ?

## 2021-06-17 ENCOUNTER — Other Ambulatory Visit: Payer: Self-pay | Admitting: Internal Medicine

## 2021-06-17 DIAGNOSIS — E785 Hyperlipidemia, unspecified: Secondary | ICD-10-CM

## 2021-06-19 NOTE — Telephone Encounter (Signed)
Requested Prescriptions  ?Pending Prescriptions Disp Refills  ?? atorvastatin (LIPITOR) 20 MG tablet [Pharmacy Med Name: ATORVASTATIN 20 MG TABLET] 90 tablet 0  ?  Sig: TAKE 1 TABLET BY MOUTH EVERY DAY  ?  ? Cardiovascular:  Antilipid - Statins Failed - 06/17/2021  9:02 AM  ?  ?  Failed - Lipid Panel in normal range within the last 12 months  ?  Cholesterol, Total  ?Date Value Ref Range Status  ?02/10/2021 138 100 - 199 mg/dL Final  ? ?LDL Chol Calc (NIH)  ?Date Value Ref Range Status  ?02/10/2021 82 0 - 99 mg/dL Final  ? ?HDL  ?Date Value Ref Range Status  ?02/10/2021 27 (L) >39 mg/dL Final  ? ?Triglycerides  ?Date Value Ref Range Status  ?02/10/2021 165 (H) 0 - 149 mg/dL Final  ? ?  ?  ?  Passed - Patient is not pregnant  ?  ?  Passed - Valid encounter within last 12 months  ?  Recent Outpatient Visits   ?      ? 1 month ago Pain, dental  ? [place] Karle Plumber B, MD  ? 4 months ago Type 2 diabetes mellitus with diabetic polyneuropathy, without long-term current use of insulin ([place])  ? [place] Ladell Pier, MD  ? 7 months ago Dermatitis  ? [facility], PA-C  ? 1 year ago Type 2 diabetes mellitus with diabetic polyneuropathy, without long-term current use of insulin ([place])  ? [place] Ladell Pier, MD  ? 1 year ago Need for influenza vaccination  ? [place], RPH-CPP  ?  ?  ? ?  ?  ?  ? ?

## 2021-06-28 ENCOUNTER — Other Ambulatory Visit: Payer: Self-pay

## 2021-06-28 ENCOUNTER — Other Ambulatory Visit: Payer: Self-pay | Admitting: Internal Medicine

## 2021-06-28 ENCOUNTER — Other Ambulatory Visit: Payer: Self-pay | Admitting: Pharmacist

## 2021-06-28 ENCOUNTER — Telehealth: Payer: Self-pay | Admitting: Pharmacist

## 2021-06-28 DIAGNOSIS — E1165 Type 2 diabetes mellitus with hyperglycemia: Secondary | ICD-10-CM

## 2021-06-28 MED ORDER — BASAGLAR KWIKPEN 100 UNIT/ML ~~LOC~~ SOPN
26.0000 [IU] | PEN_INJECTOR | Freq: Every day | SUBCUTANEOUS | 1 refills | Status: DC
Start: 2021-06-28 — End: 2021-10-13

## 2021-06-28 NOTE — Telephone Encounter (Signed)
[name],  ? ?Pharmacy is requesting a PA for this pt's Lantus. Have you received anything for him? ?

## 2021-07-24 ENCOUNTER — Other Ambulatory Visit: Payer: Self-pay | Admitting: Internal Medicine

## 2021-07-24 ENCOUNTER — Other Ambulatory Visit: Payer: Self-pay

## 2021-07-24 DIAGNOSIS — E119 Type 2 diabetes mellitus without complications: Secondary | ICD-10-CM

## 2021-07-24 MED ORDER — GLIMEPIRIDE 2 MG PO TABS: ORAL_TABLET | ORAL | 0 refills | Status: DC

## 2021-08-04 NOTE — Telephone Encounter (Signed)
error 

## 2021-09-07 ENCOUNTER — Other Ambulatory Visit: Payer: Self-pay | Admitting: Internal Medicine

## 2021-09-07 DIAGNOSIS — E1142 Type 2 diabetes mellitus with diabetic polyneuropathy: Secondary | ICD-10-CM

## 2021-09-15 ENCOUNTER — Other Ambulatory Visit: Payer: Self-pay | Admitting: Physician Assistant

## 2021-09-15 DIAGNOSIS — E1142 Type 2 diabetes mellitus with diabetic polyneuropathy: Secondary | ICD-10-CM

## 2021-09-15 NOTE — Telephone Encounter (Signed)
Patient has not received any care from Dorna Mai, MD.

## 2021-09-25 ENCOUNTER — Ambulatory Visit (INDEPENDENT_AMBULATORY_CARE_PROVIDER_SITE_OTHER): Payer: Medicare Other | Admitting: Neurology

## 2021-09-25 ENCOUNTER — Encounter: Payer: Self-pay | Admitting: Neurology

## 2021-09-25 VITALS — BP 130/89 | HR 98 | Ht 69.0 in | Wt 159.0 lb

## 2021-09-25 DIAGNOSIS — G40909 Epilepsy, unspecified, not intractable, without status epilepticus: Secondary | ICD-10-CM

## 2021-09-25 DIAGNOSIS — F0280 Dementia in other diseases classified elsewhere without behavioral disturbance: Secondary | ICD-10-CM

## 2021-09-25 MED ORDER — ESCITALOPRAM OXALATE 20 MG PO TABS: ORAL_TABLET | ORAL | 3 refills | Status: DC

## 2021-09-25 MED ORDER — LEVETIRACETAM 500 MG PO TABS: ORAL_TABLET | ORAL | 3 refills | Status: DC

## 2021-09-25 NOTE — Patient Instructions (Signed)
Good to see you.  Restart Lexapro 20mg  daily  2. Continue Keppra 500mg : take 1 tablet twice a day  3. Please establish care with [facility] (near [place]) [address], [place], [place] are welcome or you may call ahead to speak with a [facility] staff member regarding availability (preferred method). Please call [phone] for appointment information.   4. Follow-up in 6 months, call for any changes

## 2021-09-25 NOTE — Progress Notes (Unsigned)
NEUROLOGY FOLLOW UP OFFICE NOTE  [name] [id number] [birth year]  HISTORY OF PRESENT ILLNESS: I had the pleasure of seeing [name] in follow-up in the neurology clinic on 09/25/2021.  The patient was last seen 10 months ago for Major Neurocognitive disorder likely due to multiple etiologies. He is again accompanied by his sister *** who helps supplement the history today.   Every few days bathing, indep with dresing bathing At home on computer sleeping At night on computer, not sure what he is doing Poor eye contact, sleeps during the day, when not awake he is sleepign in his room Mood same old, same old, go off on sister a lot, feels they want to control  Saw a counselor, took him off the lexapro; wanted to switch to prozac for a week, went on deep end, so irritable and confrontational in Jan/Feb;  He enjoyed speech tx and home health, would open up to her No halluc Not seeing sz but sometimes limbs will turn phasing out and comes out of it, for however long her stands, some times later that he is back to normal; he would hold his hand differently; he does not remember this LEV 534m BID; went through phase where he refused to take anything (while on Prozac) Having some amazing days now, came with family out, engaging, has some good days He says he does not remember Scratching, dx with nervous dermatitis Bilateral shoulders, belly, arms; has on legs as well Says he is 51, 2023, does not know month, oriented to place Dlorw,dorlw  1/3  I had the pleasure of seeing [name] follow-up in the neurology clinic on 11/09/2020.  The patient was last seen 4 months ago for Major Neurocognitive disorder likely due to multiple etiologies. He is again accompanied by his sister MRaul Delwho helps supplement the history today. His other sister Dalton Dashwas on speakerphone.  Records and images were personally reviewed where available.  On his last visit, they continued to report cognitive and  behavioral changes, likely with mood component (adjustment disorder). They had noticed left arm weakness and previously reported left arm shaking. Ambulatory EEG in January 2022 was normal. 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 He had an EMG/NCV of the left arm in 07/2020 which showed very mild left median neuropathy and mild chronic C5-6 radiculopathy.   Since his last visit, MRaul Delreports things are the same. He states his left arm is still sore, there is occasional weakness. He still has the left arm shaking occurring 1-2 times a week, sometimes waking him up from sleep, lasting 10-15 minutes. There is no post-event weakness, it is "irritating" more than weakness. Face and leg unaffected. Dalton Dashreports that she still sees where he is "just off," some mornings he is alert, other times he is walking around in a daze. He is very inwards, not communicative, mostly sitting in his room. MRaul Delreports he does not have any routine, he states "I just sleep." He had speech cognitive therapy which were not very productive. He has been having skin lesions in his shoulders, upper arms, knees. The cream prescribed by his PCP helps a little, he is seeing Dermatology in October. Mina recalls a history of psoriasis in childhood.    History on Initial Assessment 06/16/2019: This is a 51 year old right-handed man with a history of  hyperlipidemia, diabetes, presenting for evaluation of seizure and cognitive changes. His sister [name] is present during the visit to provide additional information due to continued cognitive changes. He was in his usual state of health until the week of Christmas 2020 when he had a toothache. [name] reports he has very bad teeth, he took Tylenol, then told her he was not feeling good on 12/7. He was talking fine, recognizing  his sister, he was confused and out of it, so they brought him to the ER. [name] reported marijuana use, no alcohol or other illicit drugs. His BP was 156/104, HR 112. He was found to be in renal failure with creatinine of 10.38, AST 22, ALT 156. UDS positive for THC. MRI brain without contrast done 04/07/19 showed abnormal signal in the medial temporal lobe/hippocampus and posteromedial left thalamus, mild associated parenchymal swelling, chronic blood products along the right parietal lobe. He had an 18-hour EEG showing continuous rhythmic 2-3 Hz delta activity in the right temporoparietal region, at times sharply contoured. There was also slowing on the left hemisphere, and intermittent rhythmic generalized delta slowing. During his hospital stay, he had a generalized tonic-clonic seizure on 04/10/19, then another with note of left gaze deviation, left upper extremity extended, unresponsive. EEG showed diffuse slowing and triphasic waves. Repeat EEG showed generalized slowing, triphasic waves. He had a lumbar puncture with WBC 3, protein 53, glucose 62. Negative HSV, VZV, ACE. Negative anti-Jo, dsDNA, SS-A, SS-B, SCL 70, anti-SM Ab. Repeat MRI brain done 04/15/19 showed new changes with fairly extensive subcortical greater than cortical signal abnormality in the bilateral frontoparietal lboes, occipital lobes, and posterior temporal lobes. Patchy cortical restricted diffuse suspicious for acute/early subacute infarcts. There was also new signal change in the bilateral cerebellum and patchy restricted diffusion suspicious for acute/early subacute infarcts. 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 He received 5 days of IV Solu-Medrol. It was noted that his BP on admission was 156/104, as high as 219/98 on 1/4. EEG on 12/29 showed diffuse delta slowing in the right temporoparietal region, slowing on the left hemisphere.   He has a prior history of seizures, his sister  reports a few seizures while he was teething. In 2006/2007 he had a couple of seizure where his eyes rolled back with stiffening and shaking. He was evaluated by Dr. Gaynell Face in 2007. At that time, he woke up dizzy with rotatory nystagmus, headache, followed by left focal motor seizure. LP at that time RBCs 11,320, likely a traumatic tap, however due to sudden onset headache and possible seizure, catheter angiogram was done, which was normal. EEG in 2007 reported occasional sharp transients in the left frontotemporal region. MRI brain no acute changes.   His sister reports that "his memory just went." He did not recognize her when she visited him in the hospital. Physically he is getting better, his sister makes sure he eats regularly. Cognitively he has not improved, they need to repeat and reiterate themselves to him. No staring/unresponsiveness, but he has delayed responses, like he has to overprocess information. He is able to bathe and dress himself, but she has to remind him to shower because he thinks he just had one. Most of the time he is calm, he occasionally gets very agitated for no reason, then calms down. No paranoia or hallucinations. When he initially came home, he did not remember he had a dog and kept calling him "dog," so afraid of him. It took several weeks to be  comfortable around him. She reports that he was not taking any medications prior to hospital admission. He used to smoke a lot of weed and "swears up and down that he had not used in years," but [name] says he used it daily. No alcohol use. A few days after hospital discharge, his family found him on the floor, he was hypotensive and had soiled his clothes, then was standing in a pool of blood. He had acute GI bleed secondary to PUD.   He feels his memory is "good, bad sometimes." He has some dizziness, drowsiness, and fatigue. Balance is bad in the mornings. He has some tingling in his legs. He has occasional headaches in the back of  his head with throbbing, sensitivity to lights/sounds. No nausea/vomiting. Vision is sometimes blurred. He has occasional neck pain. No bowel/bladder dysfunction. His sister reports his legs are twitching constantly, decreasing when he lifts his legs up. [name] has not witnessed any seizures, he is taking Levetiracetam 571m BID.   Diagnostic Data: Initial MRI brain in 03/2019 showed changes in the left medial temporal lobe/hippocampus and left thalamus.   During his hospital stay, he had a GTC with left gaze deviation. Repeat MRI brain a few days later showed extensive signal abnormality in the subcortical bilateral frontoparietal lobes, occipital lobes, and posterior temporal lobes, with restricted diffusion suspicious for acute/early subacute infarcts. 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  1-hour wake and sleep EEG in 09/2019 normal  45-hour EEG in January 2022 normal, typical events not captured.  [facility] autoimmune panel negative 03/2020   PAST MEDICAL HISTORY: Past Medical History:  Diagnosis Date   Cataract cortical, senile, bilateral    [place]    Duodenal ulcer    Fatty liver    Ocular hypertension, bilateral    Peptic ulcer disease    Psoriasis    Seizures ([place]     MEDICATIONS: Current Outpatient Medications on File Prior to Visit  Medication Sig Dispense Refill   Accu-Chek Softclix Lancets lancets Please check CBG in morning before eating, 2 hours after a meal and a bed time. TID 100 each 0   acetaminophen (TYLENOL) 500 MG tablet Take 1 tablet (500 mg total) by mouth  every 6 (six) hours as needed for mild pain, moderate pain, fever or headache.     atorvastatin (LIPITOR) 20 MG tablet TAKE 1 TABLET BY MOUTH EVERY DAY 90 tablet 2   Blood Glucose Monitoring Suppl (ACCU-CHEK GUIDE ME) w/Device KIT USE AS DIRECTED 1 kit 0   fexofenadine (ALLEGRA) 180 MG tablet Take 180 mg by mouth daily as needed for allergies or rhinitis.     glimepiride (AMARYL) 2 MG tablet TAKE 1 TABLET BY MOUTH EVERY DAY BEFORE BREAKFAST 30 tablet 0   glucose blood (ACCU-CHEK GUIDE) test strip Use as instructed 100 each 12   Insulin Glargine (BASAGLAR KWIKPEN) 100 UNIT/ML Inject 26 Units into the skin daily. 15 mL 1   Insulin Pen Needle (B-D ULTRAFINE III SHORT PEN) 31G X 8 MM MISC USE AS DIRECTED 100 each 0   levETIRAcetam (KEPPRA) 500 MG tablet TAKE 1/2 TAB IN MORNING, AND 1 TAB IN THE  EVENING 45 tablet 0   metFORMIN (GLUCOPHAGE) 500 MG tablet Take 1 tablet (500 mg total) by mouth 2 (two) times daily with a meal. 180 tablet 3   pantoprazole (PROTONIX) 40 MG tablet TAKE 1 TABLET BY MOUTH TWICE A DAY 180 tablet 0   triamcinolone cream (KENALOG) 0.1 % APPLY TO AFFECTED AREA TWICE A DAY 60 g 0   acetaminophen-codeine (TYLENOL #3) 300-30 MG tablet Take 1-2 tablets by mouth every 8 (eight) hours as needed for moderate pain. (Patient not taking: Reported on 09/25/2021) 30 tablet 0   clindamycin (CLEOCIN) 300 MG capsule Take 1 capsule (300 mg total) by mouth 3 (three) times daily. (Patient not taking: Reported on 09/25/2021) 21 capsule 0   escitalopram (LEXAPRO) 20 MG tablet Take 1 tablet every night (Patient not taking: Reported on 02/09/2021) 90 tablet 3   hydrOXYzine (ATARAX/VISTARIL) 10 MG tablet TAKE 1 TABLET BY MOUTH THREE TIMES A DAY AS NEEDED (Patient not taking: Reported on 09/25/2021) 30 tablet 0   No current facility-administered medications on file prior to visit.    ALLERGIES: Allergies  Allergen Reactions   Penicillins Hives and Other (See Comments)    Childhood allergy  Did it  involve swelling of the face/tongue/throat, SOB, or low BP? Unknown Did it involve sudden or severe rash/hives, skin peeling, or any reaction on the inside of your mouth or nose? Unknown Did you need to seek medical attention at a hospital or doctor's office? Unknown When did it last happen?      Pt was a child If all above answers are "NO", may proceed with cephalosporin use. Sister states he just breaks out in hives unsure about other reactions.      FAMILY HISTORY: Family History  Problem Relation Age of Onset   Diabetes Mother    Diabetes Sister    Diabetes Father    Esophageal cancer Neg Hx    Stomach cancer Neg Hx    Colon cancer Neg Hx    Pancreatic cancer Neg Hx     SOCIAL HISTORY: Social History   Socioeconomic History   Marital status: Single    Spouse name: Not on file   Number of children: Not on file   Years of education: Not on file   Highest education level: Not on file  Occupational History   Not on file  Tobacco Use   Smoking status: Never   Smokeless tobacco: Never  Vaping Use   Vaping Use: Never used  Substance and Sexual Activity   Alcohol use: Not Currently   Drug use: Not Currently    Comment: 5 times per month (per 04/21/19 hospital note)   Sexual activity: Not on file  Other Topics Concern   Not on file  Social History Narrative   Left handed   Drinks caffeine   Single story home   Social Determinants of Health   Financial Resource Strain: Not on file  Food Insecurity: Not on file  Transportation Needs: Not on file  Physical Activity: Not on file  Stress: Not on file  Social Connections: Not on file  Intimate Partner Violence: Not on file     PHYSICAL EXAM: Vitals:   09/25/21 1434  BP: 130/89  Pulse: 98  SpO2: 99%   General: No acute distress Head:  Normocephalic/atraumatic Skin/Extremities: No rash, no edema Neurological Exam: alert and oriented to person, place, and time. No aphasia or dysarthria. Fund of knowledge is  appropriate.  Recent and remote memory are intact.  Attention  and concentration are normal.   Cranial nerves: Pupils equal, round. Extraocular movements intact with no nystagmus. Visual fields full.  No facial asymmetry.  Motor: Bulk and tone normal, muscle strength 5/5 throughout with no pronator drift.   Finger to nose testing intact.  Gait narrow-based and steady, able to tandem walk adequately.  Romberg negative.   IMPRESSION: This is a 51 yo RH man with a history of hyperlipidemia, diabetes, remote seizures, in his usual state of health until the end of December 2020. He was found to be in renal failure, initial MRI brain showed changes in the left medial temporal lobe/hippocampus and left thalamus. During his hospital stay, he had a GTC with left gaze deviation. Repeat MRI brain a few days later showed extensive signal abnormality in the subcortical bilateral frontoparietal lobes, occipital lobes, and posterior temporal lobes, with restricted diffusion suspicious for acute/early subacute infarcts. 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 Follow-up MRI brain June 2021 and most recently May 2022 no acute changes with scattered small areas of cortical encephalomalacia due to suspected PRES with a few areas progressing to infarction. 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 Autoimmune panel negative. Prolonged EEG normal.   He continues to have cognitive and behavioral changes, there is likely a strong component of mood contributing to his symptoms, there is no eye contact in the office today. Refer to Psychiatry and psychotherapy. Advised to join day programs. Discussed left arm concerns, EMG showed mild left cervical radiculopathy at C5-6, refer to PT for radiculopathy. Continue Levetiracetam 275m in AM, 5087min PM for now, may consider switching to different  seizure medication if needed. Follow-up with Dermatology as scheduled. Follow-up in 6 months, call for any changes.     Thank you for allowing me to participate in *** care.  Please do not hesitate to call for any questions or concerns.  The duration of this appointment visit was *** minutes of face-to-face time with the patient.  Greater than 50% of this time was spent in counseling, explanation of diagnosis, planning of further management, and coordination of care.   KaEllouise NewerM.D.   CC: ***

## 2021-09-30 ENCOUNTER — Other Ambulatory Visit: Payer: Self-pay | Admitting: Internal Medicine

## 2021-09-30 DIAGNOSIS — E119 Type 2 diabetes mellitus without complications: Secondary | ICD-10-CM

## 2021-10-13 ENCOUNTER — Telehealth: Payer: Self-pay

## 2021-10-13 ENCOUNTER — Encounter: Payer: Self-pay | Admitting: Internal Medicine

## 2021-10-13 ENCOUNTER — Ambulatory Visit: Payer: 59 | Attending: Internal Medicine | Admitting: Internal Medicine

## 2021-10-13 VITALS — BP 117/86 | HR 96 | Temp 98.0°F | Ht 70.0 in | Wt 157.2 lb

## 2021-10-13 DIAGNOSIS — F331 Major depressive disorder, recurrent, moderate: Secondary | ICD-10-CM | POA: Insufficient documentation

## 2021-10-13 DIAGNOSIS — F09 Unspecified mental disorder due to known physiological condition: Secondary | ICD-10-CM | POA: Insufficient documentation

## 2021-10-13 DIAGNOSIS — E119 Type 2 diabetes mellitus without complications: Secondary | ICD-10-CM | POA: Diagnosis present

## 2021-10-13 DIAGNOSIS — E785 Hyperlipidemia, unspecified: Secondary | ICD-10-CM | POA: Diagnosis not present

## 2021-10-13 DIAGNOSIS — K760 Fatty (change of) liver, not elsewhere classified: Secondary | ICD-10-CM | POA: Diagnosis not present

## 2021-10-13 DIAGNOSIS — G40909 Epilepsy, unspecified, not intractable, without status epilepticus: Secondary | ICD-10-CM | POA: Insufficient documentation

## 2021-10-13 DIAGNOSIS — Z79899 Other long term (current) drug therapy: Secondary | ICD-10-CM | POA: Diagnosis not present

## 2021-10-13 DIAGNOSIS — F321 Major depressive disorder, single episode, moderate: Secondary | ICD-10-CM | POA: Insufficient documentation

## 2021-10-13 DIAGNOSIS — Z7984 Long term (current) use of oral hypoglycemic drugs: Secondary | ICD-10-CM | POA: Diagnosis not present

## 2021-10-13 DIAGNOSIS — Z23 Encounter for immunization: Secondary | ICD-10-CM

## 2021-10-13 DIAGNOSIS — Z794 Long term (current) use of insulin: Secondary | ICD-10-CM | POA: Diagnosis not present

## 2021-10-13 DIAGNOSIS — I10 Essential (primary) hypertension: Secondary | ICD-10-CM | POA: Diagnosis not present

## 2021-10-13 LAB — POCT GLYCOSYLATED HEMOGLOBIN (HGB A1C): HbA1c, POC (controlled diabetic range): 11.7 % — AB (ref 0.0–7.0)

## 2021-10-13 LAB — GLUCOSE, POCT (MANUAL RESULT ENTRY): POC Glucose: 344 mg/dl — AB (ref 70–99)

## 2021-10-13 MED ORDER — ESCITALOPRAM OXALATE 5 MG PO TABS: 5.0000 mg | ORAL_TABLET | Freq: Every day | ORAL | 3 refills | Status: DC

## 2021-10-13 MED ORDER — TRULICITY 0.75 MG/0.5ML ~~LOC~~ SOAJ: 0.7500 mg | SUBCUTANEOUS | 5 refills | Status: DC

## 2021-10-13 NOTE — Progress Notes (Signed)
Patient ID: [name], male    DOB: 22-Aug-1970  MRN: [id number]  CC: Diabetes   Subjective: [name] is a 51 y.o. male who presents for chronic ds management.  Sister [name] is with him His concerns today include:  Pt with hx of DM type 2, Sz disorder, PUD (GIB 04/2019), HTN, fatty liver, HL, major cognitive disorder (patient had neuropsychiatric evaluation done by Dr. Alphonzo Severance 11/2019).    DM: Results for orders placed or performed in visit on 10/13/21  POCT glucose (manual entry)  Result Value Ref Range   POC Glucose 344 (A) 70 - 99 mg/dl  POCT glycosylated hemoglobin (Hb A1C)  Result Value Ref Range   Hemoglobin A1C     HbA1c POC (<> result, manual entry)     HbA1c, POC (prediabetic range)     HbA1c, POC (controlled diabetic range) 11.7 (A) 0.0 - 7.0 %  Sister reports his diet not good.  Eats pizza, chocolate, juices, sandwiches Not checking BS unless sister coerces him No Lantus in several wks because pt was not allowing for BS check. He will not give himself shot, sister has to give it.  Because of difficulties with patient getting the Lantus consistently, sister is asking about whether Trulicity would be a better option since it is a once weekly injection.  Taking Metformin and Amaryl as prescribed. Not getting in any physical activity.  According to him he states shot up in his room all day.  HL:  taking and tolerating Lipitor  Sister checks BP occasionally.  Reports good readings.  Recent was 127/68 3 days ago.  We have been observing him off medication.  SZ:  saw Dr. Delice Lesch last mth.  Patient was continued on Keppra for seizure disorder.  He does not have any seizures.  She restarted him on Lexapro at 20 mg.  Patient with history of MDD.  Referred by me to behavioral health last year.  System tells me that he saw a mental health provider in January of this year and was changed to Prozac.  Prozac made him agitated and belligerent towards family members so it was  stopped.  Since wanting to get him in with  a mental health provider who he can see in-person not via video visit as he would be less engaged via video.  She has not picked up rxn for Lexapro 20 mg as yet. Patient denies any suicidal or homicidal patient.  Patient Active Problem List   Diagnosis Date Noted   Neurocognitive disorder 03/18/2020   Elevated blood pressure reading 08/24/2019   Memory changes 05/25/2019   Non-traumatic rhabdomyolysis    Acute GI bleeding 04/21/2019   Diabetes mellitus type 2 in nonobese ([place]) 04/21/2019   Seizures ([place]) 04/19/2019   Acute blood loss anemia    Duodenal ulcer hemorrhage    Acute gastric ulcer without hemorrhage or perforation    Acute renal failure (ARF) ([place]) 09/73/5329   Acute metabolic encephalopathy 92/42/6834     Current Outpatient Medications on File Prior to Visit  Medication Sig Dispense Refill   Accu-Chek Softclix Lancets lancets Please check CBG in morning before eating, 2 hours after a meal and a bed time. TID 100 each 0   acetaminophen (TYLENOL) 500 MG tablet Take 1 tablet (500 mg total) by mouth every 6 (six) hours as needed for mild pain, moderate pain, fever or headache.     acetaminophen-codeine (TYLENOL #3) 300-30 MG tablet Take 1-2 tablets by mouth every 8 (eight) hours  as needed for moderate pain. 30 tablet 0   atorvastatin (LIPITOR) 20 MG tablet TAKE 1 TABLET BY MOUTH EVERY DAY 90 tablet 2   Blood Glucose Monitoring Suppl (ACCU-CHEK GUIDE ME) w/Device KIT USE AS DIRECTED 1 kit 0   escitalopram (LEXAPRO) 20 MG tablet Take 1 tablet every night 90 tablet 3   fexofenadine (ALLEGRA) 180 MG tablet Take 180 mg by mouth daily as needed for allergies or rhinitis.     glimepiride (AMARYL) 2 MG tablet TAKE 1 TABLET BY MOUTH EVERY DAY BEFORE BREAKFAST 30 tablet 0   glucose blood (ACCU-CHEK GUIDE) test strip Use as instructed 100 each 12   hydrOXYzine (ATARAX/VISTARIL) 10 MG tablet TAKE 1 TABLET BY MOUTH THREE TIMES A DAY AS NEEDED 30  tablet 0   Insulin Glargine (BASAGLAR KWIKPEN) 100 UNIT/ML Inject 26 Units into the skin daily. 15 mL 1   Insulin Pen Needle (B-D ULTRAFINE III SHORT PEN) 31G X 8 MM MISC USE AS DIRECTED 100 each 0   levETIRAcetam (KEPPRA) 500 MG tablet Take 1 tablet twice a day 180 tablet 3   metFORMIN (GLUCOPHAGE) 500 MG tablet Take 1 tablet (500 mg total) by mouth 2 (two) times daily with a meal. 180 tablet 3   pantoprazole (PROTONIX) 40 MG tablet TAKE 1 TABLET BY MOUTH TWICE A DAY 180 tablet 0   triamcinolone cream (KENALOG) 0.1 % APPLY TO AFFECTED AREA TWICE A DAY 60 g 0   clindamycin (CLEOCIN) 300 MG capsule Take 1 capsule (300 mg total) by mouth 3 (three) times daily. (Patient not taking: Reported on 09/25/2021) 21 capsule 0   No current facility-administered medications on file prior to visit.    Allergies  Allergen Reactions   Penicillins Hives and Other (See Comments)    Childhood allergy  Did it involve swelling of the face/tongue/throat, SOB, or low BP? Unknown Did it involve sudden or severe rash/hives, skin peeling, or any reaction on the inside of your mouth or nose? Unknown Did you need to seek medical attention at a hospital or doctor's office? Unknown When did it last happen?      Pt was a child If all above answers are "NO", may proceed with cephalosporin use. Sister states he just breaks out in hives unsure about other reactions.      Social History   Socioeconomic History   Marital status: Single    Spouse name: Not on file   Number of children: Not on file   Years of education: Not on file   Highest education level: Not on file  Occupational History   Not on file  Tobacco Use   Smoking status: Never   Smokeless tobacco: Never  Vaping Use   Vaping Use: Never used  Substance and Sexual Activity   Alcohol use: Not Currently   Drug use: Not Currently    Comment: 5 times per month (per 04/21/19 hospital note)   Sexual activity: Not on file  Other Topics Concern   Not on  file  Social History Narrative   Left handed   Drinks caffeine   Single story home   Social Determinants of Health   Financial Resource Strain: Not on file  Food Insecurity: Not on file  Transportation Needs: Not on file  Physical Activity: Not on file  Stress: Not on file  Social Connections: Not on file  Intimate Partner Violence: Not on file    Family History  Problem Relation Age of Onset   Diabetes Mother  Diabetes Sister    Diabetes Father    Esophageal cancer Neg Hx    Stomach cancer Neg Hx    Colon cancer Neg Hx    Pancreatic cancer Neg Hx     Past Surgical History:  Procedure Laterality Date   BIOPSY  04/17/2019   Procedure: BIOPSY;  Surgeon: Irene Shipper, MD;  Location: [facility] ENDOSCOPY;  Service: Endoscopy;;   ESOPHAGOGASTRODUODENOSCOPY Left 04/21/2019   Procedure: ESOPHAGOGASTRODUODENOSCOPY (EGD);  Surgeon: Jerene Bears, MD;  Location: [facility] ENDOSCOPY;  Service: Gastroenterology;  Laterality: Left;   ESOPHAGOGASTRODUODENOSCOPY (EGD) WITH PROPOFOL N/A 04/17/2019   Procedure: ESOPHAGOGASTRODUODENOSCOPY (EGD) WITH PROPOFOL;  Surgeon: Irene Shipper, MD;  Location: [facility] ENDOSCOPY;  Service: Endoscopy;  Laterality: N/A;   ESOPHAGOGASTRODUODENOSCOPY (EGD) WITH PROPOFOL N/A 04/23/2019   Procedure: ESOPHAGOGASTRODUODENOSCOPY (EGD) WITH PROPOFOL;  Surgeon: Jerene Bears, MD;  Location: [facility] ENDOSCOPY;  Service: Gastroenterology;  Laterality: N/A;   HEMOSTASIS CLIP PLACEMENT  04/17/2019   Procedure: HEMOSTASIS CLIP PLACEMENT;  Surgeon: Irene Shipper, MD;  Location: [facility] ENDOSCOPY;  Service: Endoscopy;;   HEMOSTASIS CLIP PLACEMENT  04/21/2019   Procedure: HEMOSTASIS CLIP PLACEMENT;  Surgeon: Jerene Bears, MD;  Location: [place] ENDOSCOPY;  Service: Gastroenterology;;   IR FLUORO GUIDE CV LINE RIGHT  04/10/2019   IR REMOVAL TUN CV CATH W/O FL  04/17/2019   IR US GUIDE VASC ACCESS RIGHT  04/10/2019   SUBMUCOSAL INJECTION  04/17/2019   Procedure: SUBMUCOSAL INJECTION;  Surgeon: Irene Shipper, MD;   Location: [facility] ENDOSCOPY;  Service: Endoscopy;;    ROS: Review of Systems Negative except as stated above  PHYSICAL EXAM: BP 117/86   Pulse 96   Temp 98 F (36.7 C) (Oral)   Ht _0  (1.778 m)   Wt 157 lb 3.2 oz (71.3 kg)   SpO2 97%   BMI 22.56 kg/m   Wt Readings from Last 3 Encounters:  10/13/21 157 lb 3.2 oz (71.3 kg)  09/25/21 159 lb (72.1 kg)  02/09/21 165 lb 12.8 oz (75.2 kg)  BP 129/89  Physical Exam  General appearance - pt is very withdrawn, no eye contact. Relunctant to answer questions at times.  Shrugs and shoulders Mental status -as stated above. Chest - clear to auscultation, no wheezes, rales or rhonchi, symmetric air entry Heart - normal rate, regular rhythm, normal S1, S2, no murmurs, rubs, clicks or gallops Extremities - peripheral pulses normal, no pedal edema, no clubbing or cyanosis     10/13/2021   11:21 AM 02/09/2021    4:42 PM 11/01/2020    5:17 PM  Depression screen PHQ 2/9  Decreased Interest _1 Down, Depressed, Hopeless _2 PHQ - 2 Score _3 Altered sleeping _4 Tired, decreased energy 0 2 3  Change in appetite _5 Feeling bad or failure about yourself  0  0  Trouble concentrating _6 Moving slowly or fidgety/restless 0 1 3  Suicidal thoughts 0 0 0  PHQ-9 Score _7 Latest Ref Rng & Units 02/10/2021    8:35 AM 10/10/2019   12:27 AM 10/09/2019    1:04 PM  CMP  Glucose 70 - 99 mg/dL 417  189  85   BUN 6 - 24 mg/dL _8 Creatinine 0.76 - 1.27 mg/dL 0.96  1.04  0.93   Sodium 134 - 144 mmol/L 132  138  139   Potassium 3.5 - 5.2 mmol/L 4.4  3.6  4.0   Chloride 96 - 106 mmol/L 92  104  105   CO2 20 - 29 mmol/L _0 Calcium 8.7 - 10.2 mg/dL 9.3  8.6  9.6   Total Protein 6.0 - 8.5 g/dL 6.5  5.8  6.8   Total Bilirubin 0.0 - 1.2 mg/dL 0.5  0.3  0.6   Alkaline Phos 44 - 121 IU/L 106  64  73   AST 0 - 40 IU/L _1 ALT 0 - 44 IU/L 25  37  41    Lipid Panel     Component Value Date/Time   CHOL  138 02/10/2021 0835   TRIG 165 (H) 02/10/2021 0835   HDL 27 (L) 02/10/2021 0835   CHOLHDL 5.1 (H) 02/10/2021 0835   LDLCALC 82 02/10/2021 0835    CBC    Component Value Date/Time   WBC 5.9 02/10/2021 0835   WBC 7.3 10/10/2019 0027   RBC 5.17 02/10/2021 0835   RBC 4.98 10/10/2019 0027   HGB 13.3 02/10/2021 0835   HCT 41.3 02/10/2021 0835   PLT 174 02/10/2021 0835   MCV 80 02/10/2021 0835   MCH 25.7 (L) 02/10/2021 0835   MCH 25.3 (L) 10/10/2019 0027   MCHC 32.2 02/10/2021 0835   MCHC 32.2 10/10/2019 0027   RDW 12.1 02/10/2021 0835   LYMPHSABS 2.1 10/09/2019 1304   MONOABS 0.7 10/09/2019 1304   EOSABS 0.9 (H) 10/09/2019 1304   BASOSABS 0.1 10/09/2019 1304    ASSESSMENT AND PLAN: 1. Diabetes mellitus type 2 in nonobese [facility]) Not at goal.  Not on Lantus consistently.  We discussed changing him to Trulicity but advised that it can cause some weight loss and patient is not willing to wait at this time.  Patient expressed desires to try the medication since it is a once weekly injection.  I went over side effects of the medication and how it works.  Advised to stop the medication if he develops any vomiting or pain in the upper abdomen as things can be indicative of acute pancreatitis.  Met with clinical pharmacist today to be taught how to do the injections.  He will continue metformin and Amaryl.  Lantus discontinued. - POCT glucose (manual entry) - POCT glycosylated hemoglobin (Hb A1C) - Dulaglutide (TRULICITY) 7.29 MS/1.1DB SOPN; Inject 0.75 mg into the skin once a week.  Dispense: 2 mL; Refill: 5 - Basic Metabolic Panel  2. Hyperlipidemia, unspecified hyperlipidemia type Continue atorvastatin.  3. Moderate major depression ([place]) Patient is okay with restarting Lexapro.  However since he has been off of it for a while, I would recommend restarting at the lowest dose of.  He has any increased depression on the medication, he should stop it and let me know. - escitalopram  (LEXAPRO) 5 MG tablet; Take 1 tablet (5 mg total) by mouth daily.  Dispense: 30 tablet; Refill: 3  4. Essential hypertension Sister reports good blood pressure readings at home but diastolic blood pressure elevated today and on repeat.  I advised checking his blood pressure twice a week for the next 2 weeks and then sending me those readings via MyChart.  If systolic blood pressure remains above goal, I told him I would recommend restarting med like Lisinopril  5. Need for shingles vaccine Given today Shingrix #1    Patient was given the opportunity to ask questions.  Patient verbalized  understanding of the plan and was able to repeat key elements of the plan.   This documentation was completed using Radio producer.  Any transcriptional errors are unintentional.  Orders Placed This Encounter  Procedures   POCT glucose (manual entry)   POCT glycosylated hemoglobin (Hb A1C)     Requested Prescriptions    No prescriptions requested or ordered in this encounter    No follow-ups on file.  Karle Plumber, MD, FACP

## 2021-10-13 NOTE — Telephone Encounter (Signed)
PA APPROVED FOR TRULICITY UNTIL 06/0/04. [facility] NOTIFIED

## 2021-10-14 LAB — BASIC METABOLIC PANEL
BUN/Creatinine Ratio: 14 (ref 9–20)
BUN: 14 mg/dL (ref 6–24)
CO2: 23 mmol/L (ref 20–29)
Calcium: 9.7 mg/dL (ref 8.7–10.2)
Chloride: 95 mmol/L — ABNORMAL LOW (ref 96–106)
Creatinine, Ser: 0.98 mg/dL (ref 0.76–1.27)
Glucose: 289 mg/dL — ABNORMAL HIGH (ref 70–99)
Potassium: 4.3 mmol/L (ref 3.5–5.2)
Sodium: 133 mmol/L — ABNORMAL LOW (ref 134–144)
eGFR: 93 mL/min/{1.73_m2} (ref >59)

## 2021-10-29 ENCOUNTER — Encounter: Payer: Self-pay | Admitting: Internal Medicine

## 2021-10-30 ENCOUNTER — Other Ambulatory Visit: Payer: Self-pay | Admitting: Internal Medicine

## 2021-10-30 MED ORDER — LISINOPRIL 2.5 MG PO TABS: 2.5000 mg | ORAL_TABLET | Freq: Every day | ORAL | 1 refills | Status: DC

## 2021-11-04 ENCOUNTER — Other Ambulatory Visit: Payer: Self-pay | Admitting: Internal Medicine

## 2021-11-04 DIAGNOSIS — F321 Major depressive disorder, single episode, moderate: Secondary | ICD-10-CM

## 2021-11-06 NOTE — Telephone Encounter (Signed)
Requested medication (s) are due for refill today:   Yes  Requested medication (s) are on the active medication list:   Yes  Future visit scheduled:   Yes   Last ordered: 10/13/2021 #30, 3 refills  Returned because a 90 day supply is being requested and a DX Code.   Requested Prescriptions  Pending Prescriptions Disp Refills   escitalopram (LEXAPRO) 5 MG tablet [Pharmacy Med Name: ESCITALOPRAM 5 MG TABLET] 90 tablet 2    Sig: Take 1 tablet (5 mg total) by mouth daily.     Psychiatry:  Antidepressants - SSRI Passed - 11/04/2021 10:31 AM      Passed - Completed PHQ-2 or PHQ-9 in the last 360 days      Passed - Valid encounter within last 6 months    Recent Outpatient Visits           3 weeks ago Diabetes mellitus type 2 in nonobese [facility])   [place] Ladell Pier, MD   6 months ago Pain, dental   [place] Karle Plumber B, MD   9 months ago Type 2 diabetes mellitus with diabetic polyneuropathy, without long-term current use of insulin ([place])   [place], Deborah B, MD   1 year ago Dermatitis   Primary Care at [facility], Cari S, PA-C   1 year ago Type 2 diabetes mellitus with diabetic polyneuropathy, without long-term current use of insulin [facility])   [place], MD       Future Appointments             In 3 months Wynetta Emery Dalbert Batman, MD [place]

## 2021-11-08 ENCOUNTER — Other Ambulatory Visit: Payer: Self-pay | Admitting: Physician Assistant

## 2021-11-08 DIAGNOSIS — E1142 Type 2 diabetes mellitus with diabetic polyneuropathy: Secondary | ICD-10-CM

## 2021-11-09 NOTE — Telephone Encounter (Signed)
No encounters with Amelia Wilson, MD. 

## 2021-11-23 ENCOUNTER — Other Ambulatory Visit: Payer: Self-pay | Admitting: Internal Medicine

## 2022-01-05 ENCOUNTER — Telehealth: Payer: Self-pay | Admitting: Internal Medicine

## 2022-01-05 ENCOUNTER — Ambulatory Visit: Payer: Self-pay

## 2022-01-05 NOTE — Telephone Encounter (Signed)
Caller making provider aware pt reported to her for several months or longer he periodically sees blood when having a bowel movement.

## 2022-01-05 NOTE — Telephone Encounter (Signed)
Chief Complaint: Blood in stool Symptoms: Red when wipes and in toilet sometimes not every time, mostly when constipated Frequency: Ongoing several months or longer Pertinent Negatives: Patient denies other symptoms Disposition: [] ED /[] Urgent Care (no appt availability in office) / [x] Appointment(In office/virtual)/ []  [facility]/ [] Home Care/ [] Refused Recommended Disposition /[] [place] [facility]/ []  Follow-up with PCP Additional Notes: Soni, NP with [facility] called to notify Dr. Wynetta Emery of the patient reporting to her that sometimes when he wipes and in the toilet is red blood, denies other symptoms. She says the sister [name] is the one to contact for a sooner appointment than the one already scheduled 02/16/22 with Dr. Wynetta Emery. Advised I will call the sister [name]. She was called and advised the earliest appointment is on 01/25/22 at 1110 with Dr. Margarita Rana. [name] asks if sthere are later appointment options because she doesn't want to miss a lot of time from work. Advised that's the only appt that day with any provider and the next one would be on 01/31/22 and he needs to be seen sooner than 02/16/22. Advised I will send this to Dr. Wynetta Emery and someone will call her back next week for other appt options, she verbalized understanding.  Summary: blood in stool   Caller states pt reported to her for several months or longer he periodically sees blood when having a bowel movement.   Please assist further       Reason for Disposition  [1] Rectal bleeding is minimal (e.g., blood just on toilet paper, few drops, streaks on surface of normal formed BM) AND [2] bleeding recurs 3 or more times on treatment  Answer Assessment - Initial Assessment Questions 1. APPEARANCE of BLOOD: "What color is it?" "Is it passed separately, on the surface of the stool, or mixed in with the stool?"      On the tissue when wipes and in the commode, bright red 2. AMOUNT: "How much blood was passed?"       N/A 3. FREQUENCY: "How many times has blood been passed with the stools?"      Sometimes depending on what eats and if he's constipated 4. ONSET: "When was the blood first seen in the stools?" (Days or weeks)      Several months or longer, not everyday  5. DIARRHEA: "Is there also some diarrhea?" If Yes, ask: "How many diarrhea stools in the past 24 hours?"      N/A 6. CONSTIPATION: "Do you have constipation?" If Yes, ask: "How bad is it?"     Yes, taking medications everyday 7. RECURRENT SYMPTOMS: "Have you had blood in your stools before?" If Yes, ask: "When was the last time?" and "What happened that time?"      N/A 8. BLOOD THINNERS: "Do you take any blood thinners?" (e.g., Coumadin/warfarin, Pradaxa/dabigatran, aspirin)     N/A 9. OTHER SYMPTOMS: "Do you have any other symptoms?"  (e.g., abdomen pain, vomiting, dizziness, fever)     No-patient declined 10. PREGNANCY: "Is there any chance you are pregnant?" "When was your last menstrual period?"       N/A  Protocols used: Rectal Bleeding-A-AH

## 2022-01-08 NOTE — Telephone Encounter (Signed)
Message sent to PCP in another telephone encounter

## 2022-01-11 NOTE — Telephone Encounter (Signed)
Routing to CMA 

## 2022-01-17 NOTE — Telephone Encounter (Signed)
Called spoke w/ pt & sister [name]) was on call as well. As she handles his medical info. Appt scheduled on 01/26/22@2 :10pm w/ pcp. Pt & sister expressed understanding. ----DD,RMA

## 2022-01-24 ENCOUNTER — Other Ambulatory Visit: Payer: Self-pay | Admitting: Internal Medicine

## 2022-01-24 DIAGNOSIS — E785 Hyperlipidemia, unspecified: Secondary | ICD-10-CM

## 2022-01-24 DIAGNOSIS — E119 Type 2 diabetes mellitus without complications: Secondary | ICD-10-CM

## 2022-01-24 DIAGNOSIS — K279 Peptic ulcer, site unspecified, unspecified as acute or chronic, without hemorrhage or perforation: Secondary | ICD-10-CM

## 2022-01-25 NOTE — Telephone Encounter (Signed)
Requested Prescriptions  Pending Prescriptions Disp Refills  . glimepiride (AMARYL) 2 MG tablet [Pharmacy Med Name: GLIMEPIRIDE 2 MG TABLET] 30 tablet 0    Sig: TAKE 1 TABLET BY MOUTH EVERY DAY BEFORE BREAKFAST     Endocrinology:  Diabetes - Sulfonylureas Failed - 01/24/2022  7:27 PM      Failed - HBA1C is between 0 and 7.9 and within 180 days    HbA1c, POC (controlled diabetic range)  Date Value Ref Range Status  10/13/2021 11.7 (A) 0.0 - 7.0 % Final         Passed - Cr in normal range and within 360 days    Creatinine, Ser  Date Value Ref Range Status  10/13/2021 0.98 0.76 - 1.27 mg/dL Final   Creatinine,U  Date Value Ref Range Status  10/13/2007 191.4 mg/dL Final    Comment:    See lab report for associated comment(s)   Creatinine, Urine  Date Value Ref Range Status  04/05/2019 155.79 mg/dL Final    Comment:    Performed at [facility], [place] [address], [place],  [address]         Passed - Valid encounter within last 6 months    Recent Outpatient Visits          3 months ago Diabetes mellitus type 2 in nonobese [facility])   [place] Ladell Pier, MD   8 months ago Pain, dental   [place], MD   11 months ago Type 2 diabetes mellitus with diabetic polyneuropathy, without long-term current use of insulin ([place])   [place], Deborah B, MD   1 year ago Dermatitis   Primary Care at [facility], Cari S, PA-C   1 year ago Type 2 diabetes mellitus with diabetic polyneuropathy, without long-term current use of insulin [facility])   [place], MD      Future Appointments            Tomorrow Ladell Pier, MD [place]   In 3 weeks Ladell Pier, MD [place]           . atorvastatin (LIPITOR) 20  MG tablet [Pharmacy Med Name: ATORVASTATIN 20 MG TABLET] 90 tablet 2    Sig: TAKE 1 TABLET BY MOUTH EVERY DAY     Cardiovascular:  Antilipid - Statins Failed - 01/24/2022  7:27 PM      Failed - Lipid Panel in normal range within the last 12 months    Cholesterol, Total  Date Value Ref Range Status  02/10/2021 138 100 - 199 mg/dL Final   LDL Chol Calc (NIH)  Date Value Ref Range Status  02/10/2021 82 0 - 99 mg/dL Final   HDL  Date Value Ref Range Status  02/10/2021 27 (L) >39 mg/dL Final   Triglycerides  Date Value Ref Range Status  02/10/2021 165 (H) 0 - 149 mg/dL Final         Passed - Patient is not pregnant      Passed - Valid encounter within last 12 months    Recent Outpatient Visits          3 months ago Diabetes mellitus type 2 in nonobese [facility])   [place] Ladell Pier, MD   8 months ago Pain, dental  [place] Karle Plumber B, MD   11 months ago Type 2 diabetes mellitus with diabetic polyneuropathy, without long-term current use of insulin [facility])   [place], Deborah B, MD   1 year ago Dermatitis   Primary Care at [facility], Cari S, PA-C   1 year ago Type 2 diabetes mellitus with diabetic polyneuropathy, without long-term current use of insulin [facility])   [place], MD      Future Appointments            Tomorrow Ladell Pier, MD [place]   In 3 weeks Ladell Pier, MD [place]           . pantoprazole (PROTONIX) 40 MG tablet [Pharmacy Med Name: PANTOPRAZOLE SOD DR 40 MG TAB] 180 tablet 0    Sig: TAKE 1 TABLET BY MOUTH TWICE A DAY     Gastroenterology: Proton Pump Inhibitors Passed - 01/24/2022  7:27 PM      Passed - Valid encounter within last 12 months    Recent Outpatient Visits          3 months ago  Diabetes mellitus type 2 in nonobese [facility])   [place] Ladell Pier, MD   8 months ago Pain, dental   [place], MD   11 months ago Type 2 diabetes mellitus with diabetic polyneuropathy, without long-term current use of insulin ([place])   [place], Deborah B, MD   1 year ago Dermatitis   Primary Care at [facility], [place], PA-C   1 year ago Type 2 diabetes mellitus with diabetic polyneuropathy, without long-term current use of insulin [facility])   [place], MD      Future Appointments            Tomorrow Ladell Pier, MD [place]   In 3 weeks Ladell Pier, MD [place]

## 2022-01-26 ENCOUNTER — Encounter: Payer: Self-pay | Admitting: Internal Medicine

## 2022-01-26 ENCOUNTER — Ambulatory Visit: Payer: 59 | Attending: Internal Medicine | Admitting: Internal Medicine

## 2022-01-26 ENCOUNTER — Other Ambulatory Visit: Payer: Self-pay | Admitting: Internal Medicine

## 2022-01-26 VITALS — BP 97/67 | HR 101 | Ht 70.0 in | Wt 167.0 lb

## 2022-01-26 DIAGNOSIS — Z7985 Long-term (current) use of injectable non-insulin antidiabetic drugs: Secondary | ICD-10-CM

## 2022-01-26 DIAGNOSIS — Z23 Encounter for immunization: Secondary | ICD-10-CM | POA: Diagnosis not present

## 2022-01-26 DIAGNOSIS — E1159 Type 2 diabetes mellitus with other circulatory complications: Secondary | ICD-10-CM

## 2022-01-26 DIAGNOSIS — E1169 Type 2 diabetes mellitus with other specified complication: Secondary | ICD-10-CM | POA: Diagnosis not present

## 2022-01-26 DIAGNOSIS — Z7984 Long term (current) use of oral hypoglycemic drugs: Secondary | ICD-10-CM

## 2022-01-26 DIAGNOSIS — E119 Type 2 diabetes mellitus without complications: Secondary | ICD-10-CM

## 2022-01-26 DIAGNOSIS — K921 Melena: Secondary | ICD-10-CM | POA: Diagnosis not present

## 2022-01-26 DIAGNOSIS — E785 Hyperlipidemia, unspecified: Secondary | ICD-10-CM | POA: Diagnosis not present

## 2022-01-26 DIAGNOSIS — I152 Hypertension secondary to endocrine disorders: Secondary | ICD-10-CM

## 2022-01-26 MED ORDER — TRULICITY 1.5 MG/0.5ML ~~LOC~~ SOAJ: 1.5000 mg | SUBCUTANEOUS | 6 refills | Status: DC

## 2022-01-26 MED ORDER — FREESTYLE LIBRE READER DEVI: 0 refills | Status: DC

## 2022-01-26 MED ORDER — FREESTYLE LIBRE SENSOR SYSTEM MISC: 12 refills | Status: DC

## 2022-01-26 NOTE — Progress Notes (Signed)
Blood in stool, no constipation or diarrhea.

## 2022-01-26 NOTE — Patient Instructions (Addendum)
Purchase MiraLAX over-the-counter and use 3-4 times a week to help keep bowel movements soft and regular. We will check his blood count today.  If there has been any drop in his blood cell count, we will need to get him back to the gastroenterologist for further evaluation. Try to increase intake of green leafy vegetables and fruits that are high in fiber and will help to keep bowel movements soft and regular.  We will increase the Trulicity to 1.5 mg once a week. I have sent a prescription to your pharmacy for a continuous glucose monitor libre device.

## 2022-01-26 NOTE — Progress Notes (Signed)
Patient ID: [name], male    DOB: 03-07-1971  MRN: [id number]  CC: Rectal Bleeding   Subjective: [name] is a 51 y.o. male who presents for chronic ds management.  [name], his sister, is with him His concerns today include:  Pt with hx of DM type 2, Sz disorder, PUD (GIB 04/2019), HTN, fatty liver, HL, major cognitive disorder (patient had neuropsychiatric evaluation done by Dr. Alphonzo Severance 11/2019).    Pt c/o blood in stools depending "on what I eat." Occurs about 2-3x/mth and has been occurring ever since he had c-scope 2021.  Blood mixed in with stools and when he wipes.  No pain around the rectum.  Denies frequent constipation but does have bleeding if he has to strain.  He has not had any weight loss.  As a matter fact he has gained 10 pounds since I last saw him in July and started him on Trulicity. Pt eats a lot of cheese, pizza Has Docusate Sodium 100 mg gel at home and takes 1 a day. Had c-scope 2021 that was normal.  No internal or external hemorrhoids were seen.  HTN:  sister checks BP from time to time at [facility].  Last readings 109/78, 97/73.   Taking Lisinopril 2.5 mg.  Seen by a nurse from [place] [facility] for home visit the end of last month.  Blood pressure on her visit was 100/70.  DM:  pt taking and tolerating the Trulicity. No nausea or vomiting.   Sister says he is not eating as much.  However he has gained 10 pounds. Had visit from [place] on 01/05/2022.  A1c was 8.2.  This has improved from when I saw him in July it was 11.7 at that time.   Patient Active Problem List   Diagnosis Date Noted   Moderate major depression ([place]) 10/13/2021   Neurocognitive disorder 03/18/2020   Elevated blood pressure reading 08/24/2019   Memory changes 05/25/2019   Non-traumatic rhabdomyolysis    Acute GI bleeding 04/21/2019   Diabetes mellitus type 2 in nonobese ([place]) 04/21/2019   Seizures ([place]) 04/19/2019   Acute blood loss anemia    Duodenal ulcer  hemorrhage    Acute gastric ulcer without hemorrhage or perforation    Acute renal failure (ARF) ([place]) 40/98/1191   Acute metabolic encephalopathy 47/82/9562     Current Outpatient Medications on File Prior to Visit  Medication Sig Dispense Refill   Accu-Chek Softclix Lancets lancets Please check CBG in morning before eating, 2 hours after a meal and a bed time. TID 100 each 0   acetaminophen (TYLENOL) 500 MG tablet Take 1 tablet (500 mg total) by mouth every 6 (six) hours as needed for mild pain, moderate pain, fever or headache.     acetaminophen-codeine (TYLENOL #3) 300-30 MG tablet Take 1-2 tablets by mouth every 8 (eight) hours as needed for moderate pain. 30 tablet 0   atorvastatin (LIPITOR) 20 MG tablet TAKE 1 TABLET BY MOUTH EVERY DAY 90 tablet 0   Blood Glucose Monitoring Suppl (ACCU-CHEK GUIDE ME) w/Device KIT USE AS DIRECTED 1 kit 0   Dulaglutide (TRULICITY) 1.30 QM/5.7QI SOPN Inject 0.75 mg into the skin once a week. 2 mL 5   escitalopram (LEXAPRO) 5 MG tablet TAKE 1 TABLET (5 MG TOTAL) BY MOUTH DAILY. 90 tablet 1   fexofenadine (ALLEGRA) 180 MG tablet Take 180 mg by mouth daily as needed for allergies or rhinitis.     glimepiride (AMARYL) 2 MG tablet TAKE  1 TABLET BY MOUTH EVERY DAY BEFORE BREAKFAST 90 tablet 0   glucose blood (ACCU-CHEK GUIDE) test strip Use as instructed 100 each 12   hydrOXYzine (ATARAX/VISTARIL) 10 MG tablet TAKE 1 TABLET BY MOUTH THREE TIMES A DAY AS NEEDED 30 tablet 0   Insulin Pen Needle (B-D ULTRAFINE III SHORT PEN) 31G X 8 MM MISC USE AS DIRECTED 100 each 0   levETIRAcetam (KEPPRA) 500 MG tablet Take 1 tablet twice a day 180 tablet 3   lisinopril (ZESTRIL) 2.5 MG tablet TAKE 1 TABLET BY MOUTH EVERY DAY 90 tablet 1   metFORMIN (GLUCOPHAGE) 500 MG tablet Take 1 tablet (500 mg total) by mouth 2 (two) times daily with a meal. 180 tablet 3   pantoprazole (PROTONIX) 40 MG tablet TAKE 1 TABLET BY MOUTH TWICE A DAY 180 tablet 0   triamcinolone cream (KENALOG)  0.1 % APPLY TO AFFECTED AREA TWICE A DAY 60 g 0   No current facility-administered medications on file prior to visit.    Allergies  Allergen Reactions   Penicillins Hives and Other (See Comments)    Childhood allergy  Did it involve swelling of the face/tongue/throat, SOB, or low BP? Unknown Did it involve sudden or severe rash/hives, skin peeling, or any reaction on the inside of your mouth or nose? Unknown Did you need to seek medical attention at a hospital or doctor's office? Unknown When did it last happen?      Pt was a child If all above answers are "NO", may proceed with cephalosporin use. Sister states he just breaks out in hives unsure about other reactions.     Prozac [Fluoxetine]     Agitated and belligerent towards family members    Social History   Socioeconomic History   Marital status: Single    Spouse name: Not on file   Number of children: Not on file   Years of education: Not on file   Highest education level: Not on file  Occupational History   Not on file  Tobacco Use   Smoking status: Never   Smokeless tobacco: Never  Vaping Use   Vaping Use: Never used  Substance and Sexual Activity   Alcohol use: Not Currently   Drug use: Not Currently    Comment: 5 times per month (per 04/21/19 hospital note)   Sexual activity: Not on file  Other Topics Concern   Not on file  Social History Narrative   Left handed   Drinks caffeine   Single story home   Social Determinants of Health   Financial Resource Strain: Not on file  Food Insecurity: Not on file  Transportation Needs: Not on file  Physical Activity: Not on file  Stress: Not on file  Social Connections: Not on file  Intimate Partner Violence: Not on file    Family History  Problem Relation Age of Onset   Diabetes Mother    Diabetes Sister    Diabetes Father    Esophageal cancer Neg Hx    Stomach cancer Neg Hx    Colon cancer Neg Hx    Pancreatic cancer Neg Hx     Past Surgical History:   Procedure Laterality Date   BIOPSY  04/17/2019   Procedure: BIOPSY;  Surgeon: Irene Shipper, MD;  Location: [facility];  Service: Endoscopy;;   ESOPHAGOGASTRODUODENOSCOPY Left 04/21/2019   Procedure: ESOPHAGOGASTRODUODENOSCOPY (EGD);  Surgeon: Jerene Bears, MD;  Location[facility] ENDOSCOPY;  Service: Gastroenterology;  Laterality: Left;   ESOPHAGOGASTRODUODENOSCOPY (EGD) WITH PROPOFOL  N/A 04/17/2019   Procedure: ESOPHAGOGASTRODUODENOSCOPY (EGD) WITH PROPOFOL;  Surgeon: Irene Shipper, MD;  Location: [facility] ENDOSCOPY;  Service: Endoscopy;  Laterality: N/A;   ESOPHAGOGASTRODUODENOSCOPY (EGD) WITH PROPOFOL N/A 04/23/2019   Procedure: ESOPHAGOGASTRODUODENOSCOPY (EGD) WITH PROPOFOL;  Surgeon: Jerene Bears, MD;  Location: [facility] ENDOSCOPY;  Service: Gastroenterology;  Laterality: N/A;   HEMOSTASIS CLIP PLACEMENT  04/17/2019   Procedure: HEMOSTASIS CLIP PLACEMENT;  Surgeon: Irene Shipper, MD;  Location: [facility] ENDOSCOPY;  Service: Endoscopy;;   HEMOSTASIS CLIP PLACEMENT  04/21/2019   Procedure: HEMOSTASIS CLIP PLACEMENT;  Surgeon: Jerene Bears, MD;  Location: [place] ENDOSCOPY;  Service: Gastroenterology;;   IR FLUORO GUIDE CV LINE RIGHT  04/10/2019   IR REMOVAL TUN CV CATH W/O FL  04/17/2019   IR US GUIDE VASC ACCESS RIGHT  04/10/2019   SUBMUCOSAL INJECTION  04/17/2019   Procedure: SUBMUCOSAL INJECTION;  Surgeon: Irene Shipper, MD;  Location: [facility] ENDOSCOPY;  Service: Endoscopy;;    ROS: Review of Systems Negative except as stated above  PHYSICAL EXAM: BP 97/67   Pulse (!) 101   Ht 5' 10"  (1.778 m)   Wt 167 lb (75.8 kg)   SpO2 96%   BMI 23.96 kg/m   Wt Readings from Last 3 Encounters:  01/26/22 167 lb (75.8 kg)  10/13/21 157 lb 3.2 oz (71.3 kg)  09/25/21 159 lb (72.1 kg)    Physical Exam  General appearance - alert, well appearing, and in no distress Mental status -patient is quiet and withdrawn.  He does answer some questions. Eyes -pink conjunctiva. Mouth -oral mucosa is moist. Chest - clear to auscultation, no  wheezes, rales or rhonchi, symmetric air entry Heart - normal rate, regular rhythm, normal S1, S2, no murmurs, rubs, clicks or gallops Abdomen - soft, nontender, nondistended, no masses or organomegaly Rectal -patient declines rectal exam. Extremities - peripheral pulses normal, no pedal edema, no clubbing or cyanosis     Latest Ref Rng & Units 10/13/2021   12:18 PM 02/10/2021    8:35 AM 10/10/2019   12:27 AM  CMP  Glucose 70 - 99 mg/dL 289  417  189   BUN 6 - 24 mg/dL 14  7  17    Creatinine 0.76 - 1.27 mg/dL 0.98  0.96  1.04   Sodium 134 - 144 mmol/L 133  132  138   Potassium 3.5 - 5.2 mmol/L 4.3  4.4  3.6   Chloride 96 - 106 mmol/L 95  92  104   CO2 20 - 29 mmol/L 23  26  23    Calcium 8.7 - 10.2 mg/dL 9.7  9.3  8.6   Total Protein 6.0 - 8.5 g/dL  6.5  5.8   Total Bilirubin 0.0 - 1.2 mg/dL  0.5  0.3   Alkaline Phos 44 - 121 IU/L  106  64   AST 0 - 40 IU/L  23  24   ALT 0 - 44 IU/L  25  37    Lipid Panel     Component Value Date/Time   CHOL 138 02/10/2021 0835   TRIG 165 (H) 02/10/2021 0835   HDL 27 (L) 02/10/2021 0835   CHOLHDL 5.1 (H) 02/10/2021 0835   LDLCALC 82 02/10/2021 0835    CBC    Component Value Date/Time   WBC 5.9 02/10/2021 0835   WBC 7.3 10/10/2019 0027   RBC 5.17 02/10/2021 0835   RBC 4.98 10/10/2019 0027   HGB 13.3 02/10/2021 0835   HCT 41.3 02/10/2021 0835  PLT 174 02/10/2021 0835   MCV 80 02/10/2021 0835   MCH 25.7 (L) 02/10/2021 0835   MCH 25.3 (L) 10/10/2019 0027   MCHC 32.2 02/10/2021 0835   MCHC 32.2 10/10/2019 0027   RDW 12.1 02/10/2021 0835   LYMPHSABS 2.1 10/09/2019 1304   MONOABS 0.7 10/09/2019 1304   EOSABS 0.9 (H) 10/09/2019 1304   BASOSABS 0.1 10/09/2019 1304    ASSESSMENT AND PLAN: 1. Blood in stool -Chronic issue.  He declines rectal exam today.  He had colonoscopy back in 2021 that was normal.  He has not had any weight loss.  We will check CBC to see if he has had any decrease in his hemoglobin.   In the meantime, I recommend  trying to keep the bowel movements soft and regular.  Recommend trying MiraLAX over-the-counter using it 3-4 times a week.   2. Diabetes mellitus type 2 in nonobese ([place]) Tolerating Trulicity with some decrease in A1c.  Recommend increasing the Trulicity to 1.5 mg weekly.  We will send prescription for the libre device to see if it is covered by his insurance. - Continuous Blood Gluc Sensor (FREESTYLE LIBRE SENSOR SYSTEM) MISC; Change sensor Q 2 wks  Dispense: 2 each; Refill: 12 - Continuous Blood Gluc Receiver (FREESTYLE LIBRE READER) DEVI; UAD  Dispense: 1 each; Refill: 0 - CBC - Comprehensive metabolic panel - Lipid panel - Microalbumin / creatinine urine ratio  3. Hypertension associated with type 2 diabetes mellitus ([place]) A little low today but patient denies any dizziness.  He will continue the low-dose lisinopril.  4. Hyperlipidemia associated with type 2 diabetes mellitus ([place]) Continue atorvastatin.  5. Need for immunization against influenza - Flu Vaccine QUAD 55moIM (Fluarix, Fluzone & Alfiuria Quad PF)    Patient was given the opportunity to ask questions.  Patient verbalized understanding of the plan and was able to repeat key elements of the plan.   This documentation was completed using DRadio producer  Any transcriptional errors are unintentional.  Orders Placed This Encounter  Procedures   CBC   Comprehensive metabolic panel   Lipid panel   Microalbumin / creatinine urine ratio     Requested Prescriptions   Signed Prescriptions Disp Refills   Continuous Blood Gluc Sensor (FREESTYLE LIBRE SENSOR SYSTEM) MISC 2 each 12    Sig: Change sensor Q 2 wks   Continuous Blood Gluc Receiver (FREESTYLE LIBRE READER) DEVI 1 each 0    Sig: UAD    No follow-ups on file.  DKarle Plumber MD, FACP

## 2022-01-27 LAB — COMPREHENSIVE METABOLIC PANEL
ALT: 32 IU/L (ref 0–44)
AST: 25 IU/L (ref 0–40)
Albumin/Globulin Ratio: 2.2 (ref 1.2–2.2)
Albumin: 4.9 g/dL (ref 3.8–4.9)
Alkaline Phosphatase: 85 IU/L (ref 44–121)
BUN/Creatinine Ratio: 13 (ref 9–20)
BUN: 16 mg/dL (ref 6–24)
Bilirubin Total: 0.5 mg/dL (ref 0.0–1.2)
CO2: 23 mmol/L (ref 20–29)
Calcium: 9.7 mg/dL (ref 8.7–10.2)
Chloride: 99 mmol/L (ref 96–106)
Creatinine, Ser: 1.26 mg/dL (ref 0.76–1.27)
Globulin, Total: 2.2 g/dL (ref 1.5–4.5)
Glucose: 133 mg/dL — ABNORMAL HIGH (ref 70–99)
Potassium: 4.3 mmol/L (ref 3.5–5.2)
Sodium: 139 mmol/L (ref 134–144)
Total Protein: 7.1 g/dL (ref 6.0–8.5)
eGFR: 69 mL/min/{1.73_m2} (ref >59)

## 2022-01-27 LAB — LIPID PANEL
Chol/HDL Ratio: 5.7 ratio — ABNORMAL HIGH (ref 0.0–5.0)
Cholesterol, Total: 130 mg/dL (ref 100–199)
HDL: 23 mg/dL — ABNORMAL LOW (ref >39)
LDL Chol Calc (NIH): 84 mg/dL (ref 0–99)
Triglycerides: 128 mg/dL (ref 0–149)
VLDL Cholesterol Cal: 23 mg/dL (ref 5–40)

## 2022-01-27 LAB — CBC
Hematocrit: 42 % (ref 37.5–51.0)
Hemoglobin: 13.9 g/dL (ref 13.0–17.7)
MCH: 25.8 pg — ABNORMAL LOW (ref 26.6–33.0)
MCHC: 33.1 g/dL (ref 31.5–35.7)
MCV: 78 fL — ABNORMAL LOW (ref 79–97)
Platelets: 246 10*3/uL (ref 150–450)
RBC: 5.39 x10E6/uL (ref 4.14–5.80)
RDW: 13.6 % (ref 11.6–15.4)
WBC: 7.4 10*3/uL (ref 3.4–10.8)

## 2022-01-27 LAB — MICROALBUMIN / CREATININE URINE RATIO
Creatinine, Urine: 294.1 mg/dL
Microalb/Creat Ratio: 11 mg/g creat (ref 0–29)
Microalbumin, Urine: 31.8 ug/mL

## 2022-01-29 NOTE — Telephone Encounter (Signed)
Requested medication (s) are due for refill today - no  Requested medication (s) are on the active medication list -yes  Future visit scheduled -yes  Last refill: 01/26/22  Notes to clinic: Pharmacy request: Script clarification, PA needed  Requested Prescriptions  Pending Prescriptions Disp Refills   Continuous Blood Gluc Sensor ([place] 2 SENSOR) [place] [Pharmacy Med Name: FREESTYLE LIBRE 2 SENSOR]  12    Sig: [place] 2 WEEKS     Endocrinology: Diabetes - Testing Supplies Passed - 01/26/2022  6:19 PM      Passed - Valid encounter within last 12 months    Recent Outpatient Visits           3 days ago Blood in stool   [place], Deborah B, MD   3 months ago Diabetes mellitus type 2 in nonobese [facility])   [place], Deborah B, MD   9 months ago Pain, dental   [place], Deborah B, MD   11 months ago Type 2 diabetes mellitus with diabetic polyneuropathy, without long-term current use of insulin ([place])   [place], Deborah B, MD   1 year ago Dermatitis   [facility], Loraine Grip, PA-C       Future Appointments             In 2 weeks Ladell Pier, MD [place]               Requested Prescriptions  Pending Prescriptions Disp Refills   Continuous Blood Gluc Sensor ([place] 2 SENSOR) [place] [Pharmacy Med Name: FREESTYLE LIBRE 2 SENSOR]  12    Sig: [place] 2 WEEKS     Endocrinology: Diabetes - Testing Supplies Passed - 01/26/2022  6:19 PM      Passed - Valid encounter within last 12 months    Recent Outpatient Visits           3 days ago Blood in stool   [place], Deborah B, MD   3 months ago Diabetes mellitus type 2 in nonobese [facility])   [place], Deborah B, MD   9 months ago Pain, dental   [place], MD   11 months ago Type 2 diabetes mellitus with diabetic polyneuropathy, without long-term current use of insulin [facility])   [place], Deborah B, MD   1 year ago Dermatitis   [facility] at [facility], Loraine Grip, PA-C       Future Appointments             In 2 weeks Ladell Pier, MD [place]              c

## 2022-01-31 ENCOUNTER — Other Ambulatory Visit: Payer: Self-pay

## 2022-02-16 ENCOUNTER — Ambulatory Visit: Payer: 59 | Attending: Internal Medicine | Admitting: Internal Medicine

## 2022-02-16 ENCOUNTER — Encounter: Payer: Self-pay | Admitting: Internal Medicine

## 2022-02-16 VITALS — BP 113/80 | HR 80 | Temp 97.9°F | Ht 70.0 in | Wt 165.0 lb

## 2022-02-16 DIAGNOSIS — Z23 Encounter for immunization: Secondary | ICD-10-CM | POA: Diagnosis not present

## 2022-02-16 DIAGNOSIS — K921 Melena: Secondary | ICD-10-CM

## 2022-02-16 DIAGNOSIS — L309 Dermatitis, unspecified: Secondary | ICD-10-CM

## 2022-02-16 DIAGNOSIS — E119 Type 2 diabetes mellitus without complications: Secondary | ICD-10-CM | POA: Diagnosis not present

## 2022-02-16 LAB — POCT GLYCOSYLATED HEMOGLOBIN (HGB A1C): HbA1c, POC (controlled diabetic range): 7.2 % — AB (ref 0.0–7.0)

## 2022-02-16 LAB — GLUCOSE, POCT (MANUAL RESULT ENTRY): POC Glucose: 138 mg/dl — AB (ref 70–99)

## 2022-02-16 MED ORDER — LORATADINE 10 MG PO TABS: 10.0000 mg | ORAL_TABLET | Freq: Every day | ORAL | 11 refills | Status: DC

## 2022-02-16 MED ORDER — TRIAMCINOLONE ACETONIDE 0.1 % EX CREA: TOPICAL_CREAM | CUTANEOUS | 1 refills | Status: DC

## 2022-02-16 NOTE — Addendum Note (Signed)
Addended by: Milly Jakob on: 02/16/2022 01:34 PM   Modules accepted: Orders

## 2022-02-16 NOTE — Progress Notes (Signed)
Patient ID: [name], male    DOB: 05-09-1970  MRN: [id number]  CC: Diabetes (DM f/u. Karen Kays to get sensors for the [place]. Requires a prior auth to receive coverage. /Discuss vaccines for tetanus & pneumonia. (Already received flu vax) )   Subjective: [name] is a 51 y.o. male who presents for f/u DM. Sister is with him and gives most of the history His concerns today include:  Pt with hx of DM type 2, Sz disorder, PUD (GIB 04/2019), HTN, fatty liver, HL, major cognitive disorder (patient had neuropsychiatric evaluation done by Dr. Alphonzo Severance 11/2019).     HM:  Due for 2nd shingrix  Rectal bleeding: no further bleeding; only if constipated.  Reclined rectal exam last visit.  CBC nl and stable   DM:   -did get the [place] reader but needs prior approval for sensor -doubled  up on 8.33 mg Trulicity until he runs out.  Then will get rxn filled for the 1.5 mg as prescribed on last visit -gets mild itching and redness at inj site.  Takes Benadryl with good results.  Prefers not to stop the Trulicity as he does not wish to go back on insulin  Still gets intermittent itching and rash over arms and trunk.  Worse at nights.  Saw derm at [facility].  Nothing prescribed.  Told he just needs to stop scratching himself.  It is not nearly as bad as it was several months ago. Triamcinolone helps.  Requests refill.  Patient Active Problem List   Diagnosis Date Noted   Moderate major depression ([place]) 10/13/2021   Neurocognitive disorder 03/18/2020   Elevated blood pressure reading 08/24/2019   Memory changes 05/25/2019   Non-traumatic rhabdomyolysis    Acute GI bleeding 04/21/2019   Diabetes mellitus type 2 in nonobese ([place]) 04/21/2019   Seizures ([place]) 04/19/2019   Acute blood loss anemia    Duodenal ulcer hemorrhage    Acute gastric ulcer without hemorrhage or perforation    Acute renal failure (ARF) ([place]) 38/32/9191   Acute metabolic encephalopathy 66/09/43     Current  Outpatient Medications on File Prior to Visit  Medication Sig Dispense Refill   Accu-Chek Softclix Lancets lancets Please check CBG in morning before eating, 2 hours after a meal and a bed time. TID 100 each 0   atorvastatin (LIPITOR) 20 MG tablet TAKE 1 TABLET BY MOUTH EVERY DAY 90 tablet 0   Dulaglutide (TRULICITY) 1.5 TX/7.7SF SOPN Inject 1.5 mg into the skin once a week. 2 mL 6   escitalopram (LEXAPRO) 5 MG tablet TAKE 1 TABLET (5 MG TOTAL) BY MOUTH DAILY. 90 tablet 1   glimepiride (AMARYL) 2 MG tablet TAKE 1 TABLET BY MOUTH EVERY DAY BEFORE BREAKFAST 90 tablet 0   levETIRAcetam (KEPPRA) 500 MG tablet Take 1 tablet twice a day 180 tablet 3   lisinopril (ZESTRIL) 2.5 MG tablet TAKE 1 TABLET BY MOUTH EVERY DAY 90 tablet 1   metFORMIN (GLUCOPHAGE) 500 MG tablet Take 1 tablet (500 mg total) by mouth 2 (two) times daily with a meal. 180 tablet 3   pantoprazole (PROTONIX) 40 MG tablet TAKE 1 TABLET BY MOUTH TWICE A DAY 180 tablet 0   triamcinolone cream (KENALOG) 0.1 % APPLY TO AFFECTED AREA TWICE A DAY 60 g 0   acetaminophen (TYLENOL) 500 MG tablet Take 1 tablet (500 mg total) by mouth every 6 (six) hours as needed for mild pain, moderate pain, fever or headache. (Patient not taking: Reported on  02/16/2022)     acetaminophen-codeine (TYLENOL #3) 300-30 MG tablet Take 1-2 tablets by mouth every 8 (eight) hours as needed for moderate pain. (Patient not taking: Reported on 02/16/2022) 30 tablet 0   Blood Glucose Monitoring Suppl (ACCU-CHEK GUIDE ME) w/Device KIT USE AS DIRECTED (Patient not taking: Reported on 02/16/2022) 1 kit 0   Continuous Blood Gluc Receiver (FREESTYLE LIBRE READER) DEVI UAD (Patient not taking: Reported on 02/16/2022) 1 each 0   Continuous Blood Gluc Sensor (FREESTYLE LIBRE SENSOR SYSTEM) MISC Change sensor Q 2 wks (Patient not taking: Reported on 02/16/2022) 2 each 12   fexofenadine (ALLEGRA) 180 MG tablet Take 180 mg by mouth daily as needed for allergies or rhinitis. (Patient  not taking: Reported on 02/16/2022)     glucose blood (ACCU-CHEK GUIDE) test strip Use as instructed (Patient not taking: Reported on 02/16/2022) 100 each 12   hydrOXYzine (ATARAX/VISTARIL) 10 MG tablet TAKE 1 TABLET BY MOUTH THREE TIMES A DAY AS NEEDED (Patient not taking: Reported on 02/16/2022) 30 tablet 0   Insulin Pen Needle (B-D ULTRAFINE III SHORT PEN) 31G X 8 MM MISC USE AS DIRECTED (Patient not taking: Reported on 02/16/2022) 100 each 0   No current facility-administered medications on file prior to visit.    Allergies  Allergen Reactions   Penicillins Hives and Other (See Comments)    Childhood allergy  Did it involve swelling of the face/tongue/throat, SOB, or low BP? Unknown Did it involve sudden or severe rash/hives, skin peeling, or any reaction on the inside of your mouth or nose? Unknown Did you need to seek medical attention at a hospital or doctor's office? Unknown When did it last happen?      Pt was a child If all above answers are "NO", may proceed with cephalosporin use. Sister states he just breaks out in hives unsure about other reactions.     Prozac [Fluoxetine]     Agitated and belligerent towards family members    Social History   Socioeconomic History   Marital status: Single    Spouse name: Not on file   Number of children: Not on file   Years of education: Not on file   Highest education level: Not on file  Occupational History   Not on file  Tobacco Use   Smoking status: Never   Smokeless tobacco: Never  Vaping Use   Vaping Use: Never used  Substance and Sexual Activity   Alcohol use: Not Currently   Drug use: Not Currently    Comment: 5 times per month (per 04/21/19 hospital note)   Sexual activity: Not on file  Other Topics Concern   Not on file  Social History Narrative   Left handed   Drinks caffeine   Single story home   Social Determinants of Health   Financial Resource Strain: Not on file  Food Insecurity: Not on file   Transportation Needs: Not on file  Physical Activity: Not on file  Stress: Not on file  Social Connections: Not on file  Intimate Partner Violence: Not on file    Family History  Problem Relation Age of Onset   Diabetes Mother    Diabetes Sister    Diabetes Father    Esophageal cancer Neg Hx    Stomach cancer Neg Hx    Colon cancer Neg Hx    Pancreatic cancer Neg Hx     Past Surgical History:  Procedure Laterality Date   BIOPSY  04/17/2019   Procedure: BIOPSY;  Surgeon:  Irene Shipper, MD;  Location: [facility] ENDOSCOPY;  Service: Endoscopy;;   ESOPHAGOGASTRODUODENOSCOPY Left 04/21/2019   Procedure: ESOPHAGOGASTRODUODENOSCOPY (EGD);  Surgeon: Jerene Bears, MD;  Location: [facility] ENDOSCOPY;  Service: Gastroenterology;  Laterality: Left;   ESOPHAGOGASTRODUODENOSCOPY (EGD) WITH PROPOFOL N/A 04/17/2019   Procedure: ESOPHAGOGASTRODUODENOSCOPY (EGD) WITH PROPOFOL;  Surgeon: Irene Shipper, MD;  Location: [facility] ENDOSCOPY;  Service: Endoscopy;  Laterality: N/A;   ESOPHAGOGASTRODUODENOSCOPY (EGD) WITH PROPOFOL N/A 04/23/2019   Procedure: ESOPHAGOGASTRODUODENOSCOPY (EGD) WITH PROPOFOL;  Surgeon: Jerene Bears, MD;  Location: [facility] ENDOSCOPY;  Service: Gastroenterology;  Laterality: N/A;   HEMOSTASIS CLIP PLACEMENT  04/17/2019   Procedure: HEMOSTASIS CLIP PLACEMENT;  Surgeon: Irene Shipper, MD;  Location: [facility] ENDOSCOPY;  Service: Endoscopy;;   HEMOSTASIS CLIP PLACEMENT  04/21/2019   Procedure: HEMOSTASIS CLIP PLACEMENT;  Surgeon: Jerene Bears, MD;  Location: [place] ENDOSCOPY;  Service: Gastroenterology;;   IR FLUORO GUIDE CV LINE RIGHT  04/10/2019   IR REMOVAL TUN CV CATH W/O FL  04/17/2019   IR US GUIDE VASC ACCESS RIGHT  04/10/2019   SUBMUCOSAL INJECTION  04/17/2019   Procedure: SUBMUCOSAL INJECTION;  Surgeon: Irene Shipper, MD;  Location: [facility] ENDOSCOPY;  Service: Endoscopy;;    ROS: Review of Systems Negative except as stated above  PHYSICAL EXAM: BP 113/80 (BP Location: Left Arm, Patient Position: Sitting, Cuff Size:  Normal)   Pulse 80   Temp 97.9 F (36.6 C) (Oral)   Ht _0  (1.778 m)   Wt 165 lb (74.8 kg)   SpO2 97%   BMI 23.68 kg/m   Wt Readings from Last 3 Encounters:  02/16/22 165 lb (74.8 kg)  01/26/22 167 lb (75.8 kg)  10/13/21 157 lb 3.2 oz (71.3 kg)    Physical Exam   General appearance - alert, well appearing, and in no distress Mental status -patient makes good eye contact and appears more interactive on this visit. Chest - clear to auscultation, no wheezes, rales or rhonchi, symmetric air entry Heart - normal rate, regular rhythm, normal S1, S2, no murmurs, rubs, clicks or gallops Skin: Some chronic excoriated hyperpigmented marks noted on the left upper arm.  A few spots noted over the abdomen.  This has significantly improved Results for orders placed or performed in visit on 02/16/22  POCT glucose (manual entry)  Result Value Ref Range   POC Glucose 138 (A) 70 - 99 mg/dl  POCT glycosylated hemoglobin (Hb A1C)  Result Value Ref Range   Hemoglobin A1C     HbA1c POC (<> result, manual entry)     HbA1c, POC (prediabetic range)     HbA1c, POC (controlled diabetic range) 7.2 (A) 0.0 - 7.0 %       Latest Ref Rng & Units 01/26/2022    3:27 PM 10/13/2021   12:18 PM 02/10/2021    8:35 AM  CMP  Glucose 70 - 99 mg/dL 133  289  417   BUN 6 - 24 mg/dL _1 Creatinine 0.76 - 1.27 mg/dL 1.26  0.98  0.96   Sodium 134 - 144 mmol/L 139  133  132   Potassium 3.5 - 5.2 mmol/L 4.3  4.3  4.4   Chloride 96 - 106 mmol/L 99  95  92   CO2 20 - 29 mmol/L _2 Calcium 8.7 - 10.2 mg/dL 9.7  9.7  9.3   Total Protein 6.0 - 8.5 g/dL 7.1   6.5   Total Bilirubin  0.0 - 1.2 mg/dL 0.5   0.5   Alkaline Phos 44 - 121 IU/L 85   106   AST 0 - 40 IU/L 25   23   ALT 0 - 44 IU/L 32   25    Lipid Panel     Component Value Date/Time   CHOL 130 01/26/2022 1527   TRIG 128 01/26/2022 1527   HDL 23 (L) 01/26/2022 1527   CHOLHDL 5.7 (H) 01/26/2022 1527   LDLCALC 84 01/26/2022 1527     CBC    Component Value Date/Time   WBC 7.4 01/26/2022 1527   WBC 7.3 10/10/2019 0027   RBC 5.39 01/26/2022 1527   RBC 4.98 10/10/2019 0027   HGB 13.9 01/26/2022 1527   HCT 42.0 01/26/2022 1527   PLT 246 01/26/2022 1527   MCV 78 (L) 01/26/2022 1527   MCH 25.8 (L) 01/26/2022 1527   MCH 25.3 (L) 10/10/2019 0027   MCHC 33.1 01/26/2022 1527   MCHC 32.2 10/10/2019 0027   RDW 13.6 01/26/2022 1527   LYMPHSABS 2.1 10/09/2019 1304   MONOABS 0.7 10/09/2019 1304   EOSABS 0.9 (H) 10/09/2019 1304   BASOSABS 0.1 10/09/2019 1304    ASSESSMENT AND PLAN: 1. Diabetes mellitus type 2 in nonobese [facility]) -Message sent to pharmacy tech requesting prior approval be done for the [place] sensor. Discussed stopping the Trulicity or changing to something different since he has a little bit of a local reaction to the injection.  Patient prefers to stay on the Trulicity and wanted to know if it is okay for him to continue to take the Benadryl.  We will change diet to Claritin instead since it is nonsedating. - POCT glucose (manual entry) - POCT glycosylated hemoglobin (Hb A1C)  2. Blood in stool Advised to avoid constipation by using MiraLAX as needed. CBC is stable and he is not anemic. Advised that we  still have him see the gastroenterologist to have a rectal exam and possible anal scope but patient declines.  3. Dermatitis improved - loratadine (CLARITIN) 10 MG tablet; Take 1 tablet (10 mg total) by mouth daily.  Dispense: 30 tablet; Refill: 11 - triamcinolone cream (KENALOG) 0.1 %; Apply to affected area BID PRN  Dispense: 60 g; Refill: 1  4. Need for shingles vaccine Given 2nd shingrix today    Patient was given the opportunity to ask questions.  Patient verbalized understanding of the plan and was able to repeat key elements of the plan.   This documentation was completed using Radio producer.  Any transcriptional errors are unintentional.  Orders Placed This  Encounter  Procedures   POCT glucose (manual entry)   POCT glycosylated hemoglobin (Hb A1C)     Requested Prescriptions    No prescriptions requested or ordered in this encounter    No follow-ups on file.  Karle Plumber, MD, FACP

## 2022-02-23 ENCOUNTER — Other Ambulatory Visit: Payer: Self-pay

## 2022-02-27 ENCOUNTER — Other Ambulatory Visit: Payer: Self-pay

## 2022-02-27 ENCOUNTER — Other Ambulatory Visit: Payer: Self-pay | Admitting: Internal Medicine

## 2022-02-27 DIAGNOSIS — E119 Type 2 diabetes mellitus without complications: Secondary | ICD-10-CM

## 2022-02-27 MED ORDER — FREESTYLE LIBRE SENSOR SYSTEM MISC: 12 refills | Status: DC

## 2022-02-27 MED ORDER — FREESTYLE LIBRE READER DEVI: 0 refills | Status: DC

## 2022-04-23 ENCOUNTER — Other Ambulatory Visit: Payer: Self-pay

## 2022-04-23 ENCOUNTER — Ambulatory Visit (INDEPENDENT_AMBULATORY_CARE_PROVIDER_SITE_OTHER): Payer: 59 | Admitting: Neurology

## 2022-04-23 ENCOUNTER — Encounter: Payer: Self-pay | Admitting: Pharmacist

## 2022-04-23 ENCOUNTER — Encounter: Payer: Self-pay | Admitting: Neurology

## 2022-04-23 ENCOUNTER — Ambulatory Visit: Payer: 59 | Attending: Family Medicine | Admitting: Pharmacist

## 2022-04-23 VITALS — BP 120/78 | HR 94 | Temp 98.3°F | Ht 70.0 in | Wt 159.4 lb

## 2022-04-23 VITALS — BP 114/81 | HR 97 | Ht 70.0 in | Wt 160.0 lb

## 2022-04-23 DIAGNOSIS — Z0000 Encounter for general adult medical examination without abnormal findings: Secondary | ICD-10-CM

## 2022-04-23 DIAGNOSIS — G40909 Epilepsy, unspecified, not intractable, without status epilepticus: Secondary | ICD-10-CM

## 2022-04-23 DIAGNOSIS — F0280 Dementia in other diseases classified elsewhere without behavioral disturbance: Secondary | ICD-10-CM | POA: Diagnosis not present

## 2022-04-23 DIAGNOSIS — F0284 Dementia in other diseases classified elsewhere, unspecified severity, with anxiety: Secondary | ICD-10-CM

## 2022-04-23 DIAGNOSIS — F419 Anxiety disorder, unspecified: Secondary | ICD-10-CM

## 2022-04-23 DIAGNOSIS — E119 Type 2 diabetes mellitus without complications: Secondary | ICD-10-CM

## 2022-04-23 DIAGNOSIS — F32A Depression, unspecified: Secondary | ICD-10-CM

## 2022-04-23 MED ORDER — ESCITALOPRAM OXALATE 20 MG PO TABS: 20.0000 mg | ORAL_TABLET | Freq: Every day | ORAL | 3 refills | Status: DC

## 2022-04-23 MED ORDER — TRULICITY 1.5 MG/0.5ML ~~LOC~~ SOAJ
1.5000 mg | SUBCUTANEOUS | 6 refills | Status: DC
  Filled 2022-04-23: qty 2, 28d supply, fill #0
  Filled 2022-05-14: qty 2, 28d supply, fill #1
  Filled 2022-06-14 (×3): qty 2, 28d supply, fill #2
  Filled 2022-07-13: qty 2, 28d supply, fill #3

## 2022-04-23 MED ORDER — LEVETIRACETAM 500 MG PO TABS: ORAL_TABLET | ORAL | 3 refills | Status: DC

## 2022-04-23 NOTE — Addendum Note (Signed)
Addended by: Jake Seats on: 04/23/2022 03:22 PM   Modules accepted: Orders

## 2022-04-23 NOTE — Progress Notes (Signed)
Subjective:   [name] is a 52 y.o. male who presents for a Welcome to Medicare exam.      Objective:    Today's Vitals   04/23/22 1335 04/23/22 1340  BP: 120/78   Pulse: 94   Temp: 98.3 F (36.8 C)   Weight: 159 lb 6.4 oz (72.3 kg)   Height: 5\' 10"  (1.778 m)   PainSc: 0-No pain 0-No pain   Body mass index is 22.87 kg/m.  Medications Outpatient Encounter Medications as of 04/23/2022  Medication Sig   Accu-Chek Softclix Lancets lancets Please check CBG in morning before eating, 2 hours after a meal and a bed time. TID   atorvastatin (LIPITOR) 20 MG tablet TAKE 1 TABLET BY MOUTH EVERY DAY   Dulaglutide (TRULICITY) 1.5 UU/7.2ZD SOPN Inject 1.5 mg into the skin once a week.   glimepiride (AMARYL) 2 MG tablet TAKE 1 TABLET BY MOUTH EVERY DAY BEFORE BREAKFAST   lisinopril (ZESTRIL) 2.5 MG tablet TAKE 1 TABLET BY MOUTH EVERY DAY   loratadine (CLARITIN) 10 MG tablet Take 1 tablet (10 mg total) by mouth daily.   metFORMIN (GLUCOPHAGE) 500 MG tablet Take 1 tablet (500 mg total) by mouth 2 (two) times daily with a meal.   pantoprazole (PROTONIX) 40 MG tablet TAKE 1 TABLET BY MOUTH TWICE A DAY   triamcinolone cream (KENALOG) 0.1 % Apply to affected area BID PRN   [DISCONTINUED] escitalopram (LEXAPRO) 5 MG tablet TAKE 1 TABLET (5 MG TOTAL) BY MOUTH DAILY.   [DISCONTINUED] levETIRAcetam (KEPPRA) 500 MG tablet Take 1 tablet twice a day   Blood Glucose Monitoring Suppl (ACCU-CHEK GUIDE ME) w/Device KIT USE AS DIRECTED   glucose blood (ACCU-CHEK GUIDE) test strip Use as instructed   [DISCONTINUED] acetaminophen (TYLENOL) 500 MG tablet Take 1 tablet (500 mg total) by mouth every 6 (six) hours as needed for mild pain, moderate pain, fever or headache. (Patient not taking: Reported on 02/16/2022)   [DISCONTINUED] acetaminophen-codeine (TYLENOL #3) 300-30 MG tablet Take 1-2 tablets by mouth every 8 (eight) hours as needed for moderate pain. (Patient not taking: Reported on 02/16/2022)    [DISCONTINUED] Continuous Blood Gluc Receiver (FREESTYLE LIBRE READER) DEVI UAD   [DISCONTINUED] Continuous Blood Gluc Sensor (FREESTYLE LIBRE SENSOR SYSTEM) MISC Change sensor Q 2 wks   [DISCONTINUED] Dulaglutide (TRULICITY) 1.5 GU/4.4IH SOPN Inject 1.5 mg into the skin once a week.   [DISCONTINUED] Insulin Pen Needle (B-D ULTRAFINE III SHORT PEN) 31G X 8 MM MISC USE AS DIRECTED (Patient not taking: Reported on 02/16/2022)   No facility-administered encounter medications on file as of 04/23/2022.     History: Past Medical History:  Diagnosis Date   Cataract cortical, senile, bilateral    Diabetes mellitus, type 2 ([place])    Duodenal ulcer    Fatty liver    Ocular hypertension, bilateral    Peptic ulcer disease    Psoriasis    Seizures ([place])    Past Surgical History:  Procedure Laterality Date   BIOPSY  04/17/2019   Procedure: BIOPSY;  Surgeon: Irene Shipper, MD;  Location: [facility] ENDOSCOPY;  Service: Endoscopy;;   ESOPHAGOGASTRODUODENOSCOPY Left 04/21/2019   Procedure: ESOPHAGOGASTRODUODENOSCOPY (EGD);  Surgeon: Jerene Bears, MD;  Location: [facility] ENDOSCOPY;  Service: Gastroenterology;  Laterality: Left;   ESOPHAGOGASTRODUODENOSCOPY (EGD) WITH PROPOFOL N/A 04/17/2019   Procedure: ESOPHAGOGASTRODUODENOSCOPY (EGD) WITH PROPOFOL;  Surgeon: Irene Shipper, MD;  Location: [facility] ENDOSCOPY;  Service: Endoscopy;  Laterality: N/A;   ESOPHAGOGASTRODUODENOSCOPY (EGD) WITH PROPOFOL N/A  04/23/2019   Procedure: ESOPHAGOGASTRODUODENOSCOPY (EGD) WITH PROPOFOL;  Surgeon: Jerene Bears, MD;  Location: [facility] ENDOSCOPY;  Service: Gastroenterology;  Laterality: N/A;   HEMOSTASIS CLIP PLACEMENT  04/17/2019   Procedure: HEMOSTASIS CLIP PLACEMENT;  Surgeon: Irene Shipper, MD;  Location: [facility] ENDOSCOPY;  Service: Endoscopy;;   HEMOSTASIS CLIP PLACEMENT  04/21/2019   Procedure: HEMOSTASIS CLIP PLACEMENT;  Surgeon: Jerene Bears, MD;  Location: [facility] ENDOSCOPY;  Service: Gastroenterology;;   IR FLUORO GUIDE CV LINE RIGHT  04/10/2019   IR  REMOVAL TUN CV CATH W/O FL  04/17/2019   IR US GUIDE VASC ACCESS RIGHT  04/10/2019   SUBMUCOSAL INJECTION  04/17/2019   Procedure: SUBMUCOSAL INJECTION;  Surgeon: Irene Shipper, MD;  Location: [facility] ENDOSCOPY;  Service: Endoscopy;;    Family History  Problem Relation Age of Onset   Diabetes Mother    Diabetes Sister    Diabetes Father    Esophageal cancer Neg Hx    Stomach cancer Neg Hx    Colon cancer Neg Hx    Pancreatic cancer Neg Hx    Social History   Occupational History   Not on file  Tobacco Use   Smoking status: Never   Smokeless tobacco: Never  Vaping Use   Vaping Use: Never used  Substance and Sexual Activity   Alcohol use: Not Currently   Drug use: Not Currently    Comment: 5 times per month (per 04/21/19 hospital note)   Sexual activity: Not on file   Tobacco Counseling Counseling given: No   Immunizations and Health Maintenance Immunization History  Administered Date(s) Administered   Influenza,inj,Quad PF,6+ Mos 12/23/2019, 02/09/2021, 01/26/2022   PFIZER(Purple Top)SARS-COV-2 Vaccination 06/25/2019, 07/20/2019, 12/20/2019   Pfizer Covid-19 Vaccine Bivalent Booster 81yrs & up 12/15/2020   Pneumococcal Polysaccharide-23 08/24/2019   Tdap 12/23/2019   Unspecified SARS-COV-2 Vaccination 12/13/2020   Zoster Recombinat (Shingrix) 10/13/2021, 02/16/2022   Health Maintenance Due  Topic Date Due   FOOT EXAM  08/23/2020   COVID-19 Vaccine (6 - 2023-24 season) 12/08/2021    Activities of Daily Living    04/23/2022    1:54 PM  In your present state of health, do you have any difficulty performing the following activities:  Hearing? 0  Vision? 0  Comment Patient does not wish to wear glasses at this time.  Difficulty concentrating or making decisions? 0  Walking or climbing stairs? 0  Dressing or bathing? 0  Doing errands, shopping? 1  Comment Dependent on family members  Preparing Food and eating ? Y  Using the Toilet? N  In the past six months, have you  accidently leaked urine? N  Do you have problems with loss of bowel control? N  Managing your Medications? Y  Comment Relies on his family members  Managing your Finances? Y  Comment Relies on his family members.  Housekeeping or managing your Housekeeping? N    Physical Exam  not performed  Advanced Directives: Does Patient Have a Medical Advance Directive?: No Would patient like information on creating a medical advance directive?: No - Patient declined    Assessment:    This is a routine wellness  examination for this patient .   Vision/Hearing screen No results found.  Dietary issues and exercise activities discussed:  Current Exercise Habits: The patient does not participate in regular exercise at present, Exercise limited by: psychological condition(s)   Goals   None     Depression Screen    04/23/2022    1:53 PM  02/16/2022    8:50 AM 02/16/2022    8:49 AM 01/26/2022    2:11 PM  PHQ 2/9 Scores  PHQ - 2 Score 0 2 2 2   PHQ- 9 Score  6 6 10      Fall Risk    04/23/2022    2:14 PM  Fall Risk   Falls in the past year? 0  Number falls in past yr: 0  Injury with Fall? 0  Follow up Falls evaluation completed    Cognitive Function    04/23/2022    1:56 PM 10/02/2021   12:00 PM 04/06/2020    2:00 PM  MMSE - Mini Mental State Exam  Orientation to time 2 1 3   Orientation to Place 5 5 4   Registration 3 3 3   Attention/ Calculation 5 3 3   Recall 2 1 1   Language- name 2 objects 2 2 2   Language- repeat 1 1 1   Language- follow 3 step command 3 3 3   Language- read & follow direction 0 1 1  Write a sentence 0 1 1  Copy design 0 1 1  Total score 23 22 23         Patient Care Team: Ladell Pier, MD as PCP - General (Internal Medicine) Cameron Sprang, MD as Consulting Physician (Neurology)     Plan:    I have personally reviewed and noted the following in the patient's chart:   Medical and social history Use of alcohol, tobacco or illicit drugs   Current medications and supplements Functional ability and status Nutritional status Physical activity Advanced directives List of other physicians Hospitalizations, surgeries, and ER visits in previous 12 months Vitals Screenings to include cognitive, depression, and falls Referrals and appointments  In addition, I have reviewed and discussed with patient certain preventive protocols, quality metrics, and best practice recommendations. A written personalized care plan for preventive services as well as general preventive health recommendations were provided to patient.    Tresa Endo, RPH-CPP 04/23/2022

## 2022-04-23 NOTE — Patient Instructions (Signed)
Good to see you.  Increase Escitalopram (Lexapro). With your current prescription for Lexapro 5mg : take 2 tablets (10mg ) daily until bottle is finished. Once done, start new prescription for Lexapro 20mg : take 1 tablet daily  2. Continue Levetiracetam 500mg  twice a day  3. Referral will be sent to [facility] for Psychiatry and psychotherapy  4. Follow-up in 6-8 months, call for any changes   Seizure Precautions: 1. If medication has been prescribed for you to prevent seizures, take it exactly as directed.  Do not stop taking the medicine without talking to your doctor first, even if you have not had a seizure in a long time.   2. Avoid activities in which a seizure would cause danger to yourself or to others.  Don't operate dangerous machinery, swim alone, or climb in high or dangerous places, such as on ladders, roofs, or girders.  Do not drive unless your doctor says you may.  3. If you have any warning that you may have a seizure, lay down in a safe place where you can't hurt yourself.    4.  No driving for 6 months from last seizure, as per [facility].   Please refer to the following link on the [place] website for more information: http://[email or web address]/[email or web address]/drivingu.cfm   5.  Maintain good sleep hygiene. Avoid alcohol.  6.  Contact your doctor if you have any problems that may be related to the medicine you are taking.  7.  Call 911 and bring the patient back to the ED if:        A.  The seizure lasts longer than 5 minutes.       B.  The patient doesn't awaken shortly after the seizure  C.  The patient has new problems such as difficulty seeing, speaking or moving  D.  The patient was injured during the seizure  E.  The patient has a temperature over 102 F (39C)  F.  The patient vomited and now is having trouble breathing

## 2022-04-23 NOTE — Progress Notes (Signed)
NEUROLOGY FOLLOW UP OFFICE NOTE  [name] [id number] 1970-06-24  HISTORY OF PRESENT ILLNESS: I had the pleasure of seeing [name] in follow-up in the neurology clinic on 04/23/2022.  The patient was last seen 7 months ago for Neurocognitive disorder likely due to multiple etiologies.He is again accompanied by his sister [name] who helps supplement the history today. Since his last visit, there has not been much change. He is not getting out much. When asked about his mood, he states he sleeps all day. [name] reports mood is the same. No seizures since 2020 on Levetiracetam 500mg  BID. Family notices him staring sometimes but he would respond when called. He denies any focal numbness/tingling/weakness, no myoclonic jerks. Main concern continues to be the constant itching, he shows scars on his chest, wrist, and abdomen. 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 This has been a battle for them. He is on Lexapro 5mg  daily without side effects. He denies any falls, [name] reminds him he fell out of the bed recently.   History on Initial Assessment 06/16/2019: This is a 52 year old right-handed man with a history of hyperlipidemia, diabetes, presenting for evaluation of seizure and cognitive changes. His sister [name] is present during the visit to provide additional information due to continued cognitive changes. He was in his usual state of health until the week of Christmas 2020 when he had a toothache. [name] reports he has very bad teeth, he took Tylenol, then told her he was not feeling good on 12/7. He was talking fine, recognizing his sister, he was confused and out of it, so they brought him to the ER. [name] reported marijuana use, no alcohol or other illicit drugs. His BP was 156/104, HR 112. He was found to be in renal failure with creatinine of 10.38, AST 22, ALT 156. UDS positive for THC. MRI brain without  contrast done 04/07/19 showed abnormal signal in the medial temporal lobe/hippocampus and posteromedial left thalamus, mild associated parenchymal swelling, chronic blood products along the right parietal lobe. He had an 18-hour EEG showing continuous rhythmic 2-3 Hz delta activity in the right temporoparietal region, at times sharply contoured. There was also slowing on the left hemisphere, and intermittent rhythmic generalized delta slowing. During his hospital stay, he had a generalized tonic-clonic seizure on 04/10/19, then another with note of left gaze deviation, left upper extremity extended, unresponsive. EEG showed diffuse slowing and triphasic waves. Repeat EEG showed generalized slowing, triphasic waves. He had a lumbar puncture with WBC 3, protein 53, glucose 62. Negative HSV, VZV, ACE. Negative anti-Jo, dsDNA, SS-A, SS-B, SCL 70, anti-SM Ab. Repeat MRI brain done 04/15/19 showed new changes with fairly extensive subcortical greater than cortical signal abnormality in the bilateral frontoparietal lboes, occipital lobes, and posterior temporal lobes. Patchy cortical restricted diffuse suspicious for acute/early subacute infarcts. There was also new signal change in the bilateral cerebellum and patchy restricted diffusion suspicious for acute/early subacute infarcts. 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 He received 5 days of IV Solu-Medrol. It was noted that his BP on admission was 156/104, as high as 219/98 on 1/4. EEG on 12/29 showed diffuse delta slowing in the right temporoparietal region, slowing on the left hemisphere.   He has a prior history of seizures, his sister reports a few seizures while he was teething. In 2006/2007 he had a couple  of seizure where his eyes rolled back with stiffening and shaking. He was evaluated by Dr. Gaynell Face in 2007. At that time, he woke up dizzy with rotatory nystagmus, headache, followed by left focal motor  seizure. LP at that time RBCs 11,320, likely a traumatic tap, however due to sudden onset headache and possible seizure, catheter angiogram was done, which was normal. EEG in 2007 reported occasional sharp transients in the left frontotemporal region. MRI brain no acute changes.   His sister reports that "his memory just went." He did not recognize her when she visited him in the hospital. Physically he is getting better, his sister makes sure he eats regularly. Cognitively he has not improved, they need to repeat and reiterate themselves to him. No staring/unresponsiveness, but he has delayed responses, like he has to overprocess information. He is able to bathe and dress himself, but she has to remind him to shower because he thinks he just had one. Most of the time he is calm, he occasionally gets very agitated for no reason, then calms down. No paranoia or hallucinations. When he initially came home, he did not remember he had a dog and kept calling him "dog," so afraid of him. It took several weeks to be comfortable around him. She reports that he was not taking any medications prior to hospital admission. He used to smoke a lot of weed and "swears up and down that he had not used in years," but [name] says he used it daily. No alcohol use. A few days after hospital discharge, his family found him on the floor, he was hypotensive and had soiled his clothes, then was standing in a pool of blood. He had acute GI bleed secondary to PUD.   He feels his memory is "good, bad sometimes." He has some dizziness, drowsiness, and fatigue. Balance is bad in the mornings. He has some tingling in his legs. He has occasional headaches in the back of his head with throbbing, sensitivity to lights/sounds. No nausea/vomiting. Vision is sometimes blurred. He has occasional neck pain. No bowel/bladder dysfunction. His sister reports his legs are twitching constantly, decreasing when he lifts his legs up. [name] has not witnessed any  seizures, he is taking Levetiracetam 500mg  BID.   Diagnostic Data: Initial MRI brain in 03/2019 showed changes in the left medial temporal lobe/hippocampus and left thalamus.   During his hospital stay, he had a GTC with left gaze deviation. Repeat MRI brain a few days later showed extensive signal abnormality in the subcortical bilateral frontoparietal lobes, occipital lobes, and posterior temporal lobes, with restricted diffusion suspicious for acute/early subacute infarcts. 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   Repeat brain MRI with and without contrast in May 2022 no acute changes, there were small remote cortical infarcts scattered in the bilateral frontoparietal regions, chronic microvascular disease.  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  1-hour wake and sleep EEG in 09/2019 normal  Ambulatory 45-hour EEG in January 2022 normal, typical events not captured.  [facility] negative 03/2020  EMG/NCV of left arm in April 2022 showed very mild left median neuropathy and mild chronic C5-6 radiculopathy   PAST MEDICAL HISTORY: Past Medical History:  Diagnosis Date  Cataract cortical, senile, bilateral    Diabetes mellitus, type 2 ([place])    Duodenal ulcer    Fatty liver    Ocular hypertension, bilateral    Peptic ulcer disease    Psoriasis    Seizures ([place])     MEDICATIONS: Current Outpatient Medications on File Prior to Visit  Medication Sig Dispense Refill   Accu-Chek Softclix Lancets lancets Please check CBG in morning before eating, 2 hours after a meal and a bed time. TID 100 each 0   atorvastatin (LIPITOR) 20 MG  tablet TAKE 1 TABLET BY MOUTH EVERY DAY 90 tablet 0   Blood Glucose Monitoring Suppl (ACCU-CHEK GUIDE ME) w/Device KIT USE AS DIRECTED 1 kit 0   escitalopram (LEXAPRO) 5 MG tablet TAKE 1 TABLET (5 MG TOTAL) BY MOUTH DAILY. 90 tablet 1   glimepiride (AMARYL) 2 MG tablet TAKE 1 TABLET BY MOUTH EVERY DAY BEFORE BREAKFAST 90 tablet 0   glucose blood (ACCU-CHEK GUIDE) test strip Use as instructed 100 each 12   levETIRAcetam (KEPPRA) 500 MG tablet Take 1 tablet twice a day 180 tablet 3   lisinopril (ZESTRIL) 2.5 MG tablet TAKE 1 TABLET BY MOUTH EVERY DAY 90 tablet 1   loratadine (CLARITIN) 10 MG tablet Take 1 tablet (10 mg total) by mouth daily. 30 tablet 11   metFORMIN (GLUCOPHAGE) 500 MG tablet Take 1 tablet (500 mg total) by mouth 2 (two) times daily with a meal. 180 tablet 3   pantoprazole (PROTONIX) 40 MG tablet TAKE 1 TABLET BY MOUTH TWICE A DAY 180 tablet 0   triamcinolone cream (KENALOG) 0.1 % Apply to affected area BID PRN 60 g 1   No current facility-administered medications on file prior to visit.    ALLERGIES: Allergies  Allergen Reactions   Penicillins Hives and Other (See Comments)    Childhood allergy  Did it involve swelling of the face/tongue/throat, SOB, or low BP? Unknown Did it involve sudden or severe rash/hives, skin peeling, or any reaction on the inside of your mouth or nose? Unknown Did you need to seek medical attention at a hospital or doctor's office? Unknown When did it last happen?      Pt was a child If all above answers are "NO", may proceed with cephalosporin use. Sister states he just breaks out in hives unsure about other reactions.     Prozac [Fluoxetine]     Agitated and belligerent towards family members    FAMILY HISTORY: Family History  Problem Relation Age of Onset   Diabetes Mother    Diabetes Sister    Diabetes Father    Esophageal cancer Neg Hx    Stomach cancer Neg Hx    Colon cancer Neg Hx    Pancreatic cancer Neg Hx     SOCIAL  HISTORY: Social History   Socioeconomic History   Marital status: Single    Spouse name: Not on file   Number of children: Not on file   Years of education: Not on file   Highest education level: Not on file  Occupational History   Not on file  Tobacco Use   Smoking status: Never   Smokeless tobacco: Never  Vaping Use   Vaping Use: Never used  Substance and Sexual Activity   Alcohol use: Not Currently   Drug use: Not Currently    Comment: 5 times per month (per 04/21/19 hospital note)   Sexual activity: Not on file  Other Topics Concern   Not on file  Social  History Narrative   Left handed   Drinks caffeine   Single story home   Social Determinants of Health   Financial Resource Strain: Not on file  Food Insecurity: Not on file  Transportation Needs: Not on file  Physical Activity: Not on file  Stress: Not on file  Social Connections: Not on file  Intimate Partner Violence: Not on file     PHYSICAL EXAM: Vitals:   04/23/22 1411  BP: 114/81  Pulse: 97  SpO2: 98%   General: No acute distress, flat affect, poor eye contact Head:  Normocephalic/atraumatic Skin/Extremities: No rash, no edema Neurological Exam: alert and awake. No aphasia or dysarthria. Fund of knowledge is reduced. Attention and concentration are reduced. Cranial nerves: Pupils equal, round. Extraocular movements intact with no nystagmus. Visual fields full.  No facial asymmetry.  Motor: Bulk and tone normal, muscle strength 5/5 throughout with no pronator drift.   Finger to nose testing intact.  Gait narrow-based and steady, no ataxia. No tremors.   IMPRESSION: This is a 51 yo RH man with a history of hyperlipidemia, diabetes, remote seizures, in his usual state of health until the end of December 2020. He was found to be in renal failure, initial MRI brain showed changes in the left medial temporal lobe/hippocampus and left thalamus. During his hospital stay, he had a GTC with left gaze deviation.  Repeat MRI brain a few days later showed extensive signal abnormality in the subcortical bilateral frontoparietal lobes, occipital lobes, and posterior temporal lobes, with restricted diffusion suspicious for acute/early subacute infarcts. 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 Follow-up MRI brain June 2021 and most recently May 2022 no acute changes with scattered small areas of cortical encephalomalacia due to suspected PRES with a few areas progressing to infarction. 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 Autoimmune panel negative. Prolonged EEG normal.   Symptoms overall stable, no seizures since 2020 on Levetiracetam 500mg  BID. He continues to have poor interaction, increase Lexapro back to 20mg  over the next week or so. He was previously taking this dose. This may help with anxiety leading to constant scratching. He will be referred to [facility] for Psychiatry and psychotherapy for depression/anxiety. They would like to see their provider in person. He does not drive. Continue close supervision. Follow-up in 6-8 months, call for any changes.     Thank you for allowing me to participate in his care.  Please do not hesitate to call for any questions or concerns.    Ellouise Newer, M.D.   CC: Dr. Wynetta Emery

## 2022-04-24 ENCOUNTER — Other Ambulatory Visit: Payer: Self-pay | Admitting: Internal Medicine

## 2022-04-24 DIAGNOSIS — E119 Type 2 diabetes mellitus without complications: Secondary | ICD-10-CM

## 2022-04-24 DIAGNOSIS — K279 Peptic ulcer, site unspecified, unspecified as acute or chronic, without hemorrhage or perforation: Secondary | ICD-10-CM

## 2022-04-24 NOTE — Telephone Encounter (Signed)
Requested Prescriptions  Pending Prescriptions Disp Refills   glimepiride (AMARYL) 2 MG tablet [Pharmacy Med Name: GLIMEPIRIDE 2 MG TABLET] 90 tablet 0    Sig: TAKE 1 TABLET BY MOUTH EVERY DAY BEFORE BREAKFAST     Endocrinology:  Diabetes - Sulfonylureas Passed - 04/24/2022  1:35 AM      Passed - HBA1C is between 0 and 7.9 and within 180 days    HbA1c, POC (controlled diabetic range)  Date Value Ref Range Status  02/16/2022 7.2 (A) 0.0 - 7.0 % Final         Passed - Cr in normal range and within 360 days    Creatinine, Ser  Date Value Ref Range Status  01/26/2022 1.26 0.76 - 1.27 mg/dL Final   Creatinine,U  Date Value Ref Range Status  10/13/2007 191.4 mg/dL Final    Comment:    See lab report for associated comment(s)   Creatinine, Urine  Date Value Ref Range Status  04/05/2019 155.79 mg/dL Final    Comment:    Performed at [facility], [place] [address], [place], [place] [address]         Passed - Valid encounter within last 6 months    Recent Outpatient Visits           Yesterday Encounter for Medicare annual wellness exam   [place], RPH-CPP   2 months ago Diabetes mellitus type 2 in nonobese [facility])   [place] Ladell Pier, MD   2 months ago Blood in stool   [place], MD   6 months ago Diabetes mellitus type 2 in nonobese [facility])   [place] Ladell Pier, MD   11 months ago Pain, dental   [place], MD       Future Appointments             In 1 month Wynetta Emery Dalbert Batman, MD [place]             pantoprazole (PROTONIX) 40 MG tablet [Pharmacy Med Name: PANTOPRAZOLE SOD DR 40 MG TAB] 180 tablet 0    Sig: TAKE 1 TABLET BY MOUTH TWICE A DAY     Gastroenterology: Proton Pump  Inhibitors Passed - 04/24/2022  1:35 AM      Passed - Valid encounter within last 12 months    Recent Outpatient Visits           Yesterday Encounter for Medicare annual wellness exam   [place], Jarome Matin, RPH-CPP   2 months ago Diabetes mellitus type 2 in nonobese [facility])   [place] Ladell Pier, MD   2 months ago Blood in stool   [place], MD   6 months ago Diabetes mellitus type 2 in nonobese [facility])   [place], MD   11 months ago Pain, dental   [place], MD       Future Appointments             In 1 month Wynetta Emery Dalbert Batman, MD [place]

## 2022-04-25 ENCOUNTER — Telehealth: Payer: Self-pay | Admitting: Internal Medicine

## 2022-04-25 ENCOUNTER — Encounter: Payer: Self-pay | Admitting: Internal Medicine

## 2022-04-25 MED ORDER — HYDROXYZINE PAMOATE 25 MG PO CAPS: 25.0000 mg | ORAL_CAPSULE | Freq: Every day | ORAL | 2 refills | Status: DC

## 2022-04-25 NOTE — Telephone Encounter (Signed)
-----  Message from Tresa Endo, RPH-CPP sent at 04/23/2022  3:50 PM EST ----- Hey Dr. Wynetta Emery,   His sister said Claritin is not working for him. She requests hydroxyzine. She wonders if this will help with anxiety and itching. She also mentions his itching is a problem mostly at night.  Would you consider hydroxyzine in him?  Lurena Joiner

## 2022-05-10 ENCOUNTER — Other Ambulatory Visit: Payer: Self-pay

## 2022-05-14 ENCOUNTER — Other Ambulatory Visit: Payer: Self-pay

## 2022-05-17 ENCOUNTER — Other Ambulatory Visit: Payer: Self-pay | Admitting: Internal Medicine

## 2022-05-17 NOTE — Telephone Encounter (Signed)
Requested Prescriptions  Pending Prescriptions Disp Refills   hydrOXYzine (VISTARIL) 25 MG capsule [Pharmacy Med Name: HYDROXYZINE PAM 25 MG CAP] 90 capsule 0    Sig: TAKE 1 CAPSULE BY MOUTH AT BEDTIME.     Ear, Nose, and Throat:  Antihistamines 2 Passed - 05/17/2022  9:30 AM      Passed - Cr in normal range and within 360 days    Creatinine, Ser  Date Value Ref Range Status  01/26/2022 1.26 0.76 - 1.27 mg/dL Final   Creatinine,U  Date Value Ref Range Status  10/13/2007 191.4 mg/dL Final    Comment:    See lab report for associated comment(s)   Creatinine, Urine  Date Value Ref Range Status  04/05/2019 155.79 mg/dL Final    Comment:    Performed at [facility], [place] [address] [place], [place] [address]         Passed - Valid encounter within last 12 months    Recent Outpatient Visits           3 weeks ago Encounter for Medicare annual wellness exam   [place], RPH-CPP   3 months ago Diabetes mellitus type 2 in nonobese [facility])   [place] Ladell Pier, MD   3 months ago Blood in stool   [place], MD   7 months ago Diabetes mellitus type 2 in nonobese [facility])   [place] Ladell Pier, MD   1 year ago Pain, dental   [place], MD       Future Appointments             In 1 month Wynetta Emery, Dalbert Batman, MD [place]

## 2022-05-21 ENCOUNTER — Other Ambulatory Visit: Payer: Self-pay

## 2022-05-28 LAB — HM DIABETES EYE EXAM

## 2022-06-01 ENCOUNTER — Other Ambulatory Visit: Payer: Self-pay | Admitting: Physician Assistant

## 2022-06-01 ENCOUNTER — Other Ambulatory Visit: Payer: Self-pay | Admitting: Internal Medicine

## 2022-06-01 DIAGNOSIS — K279 Peptic ulcer, site unspecified, unspecified as acute or chronic, without hemorrhage or perforation: Secondary | ICD-10-CM

## 2022-06-01 DIAGNOSIS — E785 Hyperlipidemia, unspecified: Secondary | ICD-10-CM

## 2022-06-01 DIAGNOSIS — E1142 Type 2 diabetes mellitus with diabetic polyneuropathy: Secondary | ICD-10-CM

## 2022-06-01 DIAGNOSIS — E1165 Type 2 diabetes mellitus with hyperglycemia: Secondary | ICD-10-CM

## 2022-06-01 DIAGNOSIS — E119 Type 2 diabetes mellitus without complications: Secondary | ICD-10-CM

## 2022-06-01 NOTE — Telephone Encounter (Signed)
Multiple Rx requested for RF- only able to fill: Metformin 05/02/21 #180 3RF, Lisinopril 11/23/21 #90 1RF, Atorvastatin 01/25/22 #90- all others current and should be on file Requested Prescriptions  Pending Prescriptions Disp Refills   pantoprazole (PROTONIX) 40 MG tablet [Pharmacy Med Name: PANTOPRAZOLE SOD DR 40 MG TAB] 180 tablet 0    Sig: TAKE 1 TABLET BY MOUTH TWICE A DAY     Gastroenterology: Proton Pump Inhibitors Passed - 06/01/2022  1:04 PM      Passed - Valid encounter within last 12 months    Recent Outpatient Visits           1 month ago Encounter for [employer] annual wellness exam   [place], [place] L, RPH-CPP   3 months ago Diabetes mellitus type 2 in nonobese [facility])   [place] Karle Plumber B, MD   4 months ago Blood in stool   [place], MD   7 months ago Diabetes mellitus type 2 in nonobese [facility])   [place] Ladell Pier, MD   1 year ago Pain, dental   [place], MD       Future Appointments             In 3 weeks Ladell Pier, MD [place]             metFORMIN (GLUCOPHAGE) 500 MG tablet [Pharmacy Med Name: METFORMIN HCL 500 MG TABLET] 180 tablet 3    Sig: TAKE 1 TABLET BY MOUTH 2 TIMES DAILY WITH A MEAL.     Endocrinology:  Diabetes - Biguanides Failed - 06/01/2022  1:04 PM      Failed - B12 Level in normal range and within 720 days    Vitamin B-12  Date Value Ref Range Status  04/16/2019 329 180 - 914 pg/mL Final    Comment:    (NOTE) This assay is not validated for testing neonatal or myeloproliferative syndrome specimens for Vitamin B12 levels. Performed at [facility], [place] [address] [place], [place] [address]          Failed - CBC within normal limits  and completed in the last 12 months    WBC  Date Value Ref Range Status  01/26/2022 7.4 3.4 - 10.8 x10E3/uL Final  10/10/2019 7.3 4.0 - 10.5 K/uL Final   RBC  Date Value Ref Range Status  01/26/2022 5.39 4.14 - 5.80 x10E6/uL Final  10/10/2019 4.98 4.22 - 5.81 MIL/uL Final   Hemoglobin  Date Value Ref Range Status  01/26/2022 13.9 13.0 - 17.7 g/dL Final   Hematocrit  Date Value Ref Range Status  01/26/2022 42.0 37.5 - 51.0 % Final   MCHC  Date Value Ref Range Status  01/26/2022 33.1 31.5 - 35.7 g/dL Final  10/10/2019 32.2 30.0 - 36.0 g/dL Final   [facility]  Date Value Ref Range Status  01/26/2022 25.8 (L) 26.6 - 33.0 pg Final  10/10/2019 25.3 (L) 26.0 - 34.0 pg Final   MCV  Date Value Ref Range Status  01/26/2022 78 (L) 79 - 97 fL Final   No results found for: "PLTCOUNTKUC", "LABPLAT", "POCPLA" RDW  Date Value Ref Range Status  01/26/2022 13.6 11.6 - 15.4 % Final         Passed - Cr  in normal range and within 360 days    Creatinine, Ser  Date Value Ref Range Status  01/26/2022 1.26 0.76 - 1.27 mg/dL Final   Creatinine,U  Date Value Ref Range Status  10/13/2007 191.4 mg/dL Final    Comment:    See lab report for associated comment(s)   Creatinine, Urine  Date Value Ref Range Status  04/05/2019 155.79 mg/dL Final    Comment:    Performed at [facility], [place] [address], [place], [place] [address]         Passed - HBA1C is between 0 and 7.9 and within 180 days    HbA1c, POC (controlled diabetic range)  Date Value Ref Range Status  02/16/2022 7.2 (A) 0.0 - 7.0 % Final         Passed - eGFR in normal range and within 360 days    GFR calc Af Amer  Date Value Ref Range Status  10/10/2019 >60 >60 mL/min Final   GFR calc non Af Amer  Date Value Ref Range Status  10/10/2019 >60 >60 mL/min Final   eGFR  Date Value Ref Range Status  01/26/2022 69 >59 mL/min/1.73 Final         Passed - Valid encounter within last 6 months    Recent  Outpatient Visits           1 month ago Encounter for [employer] annual wellness exam   [place], [place] L, RPH-CPP   3 months ago Diabetes mellitus type 2 in nonobese [facility])   [place] Karle Plumber B, MD   4 months ago Blood in stool   [place], MD   7 months ago Diabetes mellitus type 2 in nonobese [facility])   [place] Ladell Pier, MD   1 year ago Pain, dental   [place], MD       Future Appointments             In 3 weeks Ladell Pier, MD [place]             lisinopril (ZESTRIL) 2.5 MG tablet [Pharmacy Med Name: LISINOPRIL 2.5 MG TABLET] 90 tablet 1    Sig: TAKE 1 TABLET BY MOUTH EVERY DAY     Cardiovascular:  ACE Inhibitors Passed - 06/01/2022  1:04 PM      Passed - Cr in normal range and within 180 days    Creatinine, Ser  Date Value Ref Range Status  01/26/2022 1.26 0.76 - 1.27 mg/dL Final   Creatinine,U  Date Value Ref Range Status  10/13/2007 191.4 mg/dL Final    Comment:    See lab report for associated comment(s)   Creatinine, Urine  Date Value Ref Range Status  04/05/2019 155.79 mg/dL Final    Comment:    Performed at [facility], [place] [address], [place], [place] [address]         Passed - K in normal range and within 180 days    Potassium  Date Value Ref Range Status  01/26/2022 4.3 3.5 - 5.2 mmol/L Final         Passed - Patient is not pregnant      Passed - Last BP in normal range    BP Readings from Last  1 Encounters:  04/23/22 114/81         Passed - Valid encounter within last 6 months    Recent Outpatient Visits           1 month ago Encounter for [employer] annual wellness exam   [place], RPH-CPP   3 months ago Diabetes mellitus type 2 in nonobese [facility])   [place] Ladell Pier, MD   4 months ago Blood in stool   [place], MD   7 months ago Diabetes mellitus type 2 in nonobese [facility])   [place] Ladell Pier, MD   1 year ago Pain, dental   [place], MD       Future Appointments             In 3 weeks Ladell Pier, MD [place]             atorvastatin (LIPITOR) 20 MG tablet [Pharmacy Med Name: ATORVASTATIN 20 MG TABLET] 90 tablet 0    Sig: TAKE 1 TABLET BY MOUTH EVERY DAY     Cardiovascular:  Antilipid - Statins Failed - 06/01/2022  1:04 PM      Failed - Lipid Panel in normal range within the last 12 months    Cholesterol, Total  Date Value Ref Range Status  01/26/2022 130 100 - 199 mg/dL Final   LDL Chol Calc (NIH)  Date Value Ref Range Status  01/26/2022 84 0 - 99 mg/dL Final   HDL  Date Value Ref Range Status  01/26/2022 23 (L) >39 mg/dL Final   Triglycerides  Date Value Ref Range Status  01/26/2022 128 0 - 149 mg/dL Final         Passed - Patient is not pregnant      Passed - Valid encounter within last 12 months    Recent Outpatient Visits           1 month ago Encounter for [employer] annual wellness exam   [place], [place] L, RPH-CPP   3 months ago Diabetes mellitus type 2 in nonobese [facility])   [place] Karle Plumber B, MD   4 months ago Blood in stool   [place], MD   7 months ago Diabetes mellitus type 2 in nonobese [facility])   [place] Ladell Pier, MD   1 year ago Pain, dental   [place], MD       Future Appointments             In 3 weeks Ladell Pier, MD [place]             hydrOXYzine (VISTARIL) 25 MG capsule [Pharmacy Med Name: HYDROXYZINE PAM 25 MG CAP] 90 capsule 0    Sig: TAKE 1 CAPSULE BY MOUTH EVERYDAY AT BEDTIME     Ear, Nose, and Throat:  Antihistamines 2 Passed - 06/01/2022  1:04 PM      Passed - Cr in normal range and within 360 days    Creatinine, Ser  Date Value  Ref Range Status  01/26/2022 1.26 0.76 - 1.27 mg/dL Final   Creatinine,U  Date Value Ref Range Status  10/13/2007 191.4 mg/dL Final    Comment:    See lab report for associated comment(s)   Creatinine, Urine  Date Value Ref Range Status  04/05/2019 155.79 mg/dL Final    Comment:    Performed at [facility], [place] [address], [place], [place] [address]         Passed - Valid encounter within last 12 months    Recent Outpatient Visits           1 month ago Encounter for Medicare annual wellness exam   [place], RPH-CPP   3 months ago Diabetes mellitus type 2 in nonobese [facility])   [place] Ladell Pier, MD   4 months ago Blood in stool   [place], MD   7 months ago Diabetes mellitus type 2 in nonobese [facility])   [place] Ladell Pier, MD   1 year ago Pain, dental   [place], MD       Future Appointments             In 3 weeks Ladell Pier, MD [place] 1.5 0000000 SOPN [Pharmacy Med Name: TRULICITY 1.5 [id number] ML PEN]  6    Sig: INJECT 1.5 MG INTO THE SKIN ONCE A WEEK.     Endocrinology:  Diabetes - GLP-1 Receptor Agonists Passed -  06/01/2022  1:04 PM      Passed - HBA1C is between 0 and 7.9 and within 180 days    HbA1c, POC (controlled diabetic range)  Date Value Ref Range Status  02/16/2022 7.2 (A) 0.0 - 7.0 % Final         Passed - Valid encounter within last 6 months    Recent Outpatient Visits           1 month ago Encounter for [employer] annual wellness exam   [place], RPH-CPP   3 months ago Diabetes mellitus type 2 in nonobese [facility])   [place] Karle Plumber B, MD   4 months ago Blood in stool   [place], MD   7 months ago Diabetes mellitus type 2 in nonobese [facility])   [place] Ladell Pier, MD   1 year ago Pain, dental   [place], MD       Future Appointments             In 3 weeks Ladell Pier, MD [place]             glimepiride (AMARYL) 2 MG tablet [Pharmacy Med Name: GLIMEPIRIDE 2 MG TABLET] 90 tablet 0    Sig: TAKE 1 TABLET BY MOUTH EVERY [place]     Endocrinology:  Diabetes - Sulfonylureas Passed - 06/01/2022  1:04 PM      Passed - HBA1C is between 0 and 7.9 and within  180 days    HbA1c, POC (controlled diabetic range)  Date Value Ref Range Status  02/16/2022 7.2 (A) 0.0 - 7.0 % Final         Passed - Cr in normal range and within 360 days    Creatinine, Ser  Date Value Ref Range Status  01/26/2022 1.26 0.76 - 1.27 mg/dL Final   Creatinine,U  Date Value Ref Range Status  10/13/2007 191.4 mg/dL Final    Comment:    See lab report for associated comment(s)   Creatinine, Urine  Date Value Ref Range Status  04/05/2019 155.79 mg/dL Final    Comment:    Performed at [facility], [place] [address], [place], [place] [address]         Passed - Valid  encounter within last 6 months    Recent Outpatient Visits           1 month ago Encounter for Medicare annual wellness exam   [place], RPH-CPP   3 months ago Diabetes mellitus type 2 in nonobese [facility])   [place] Ladell Pier, MD   4 months ago Blood in stool   [place], MD   7 months ago Diabetes mellitus type 2 in nonobese [facility])   [place] Ladell Pier, MD   1 year ago Pain, dental   [place], MD       Future Appointments             In 3 weeks Ladell Pier, MD [place]

## 2022-06-07 ENCOUNTER — Telehealth: Payer: Self-pay

## 2022-06-07 NOTE — Telephone Encounter (Signed)
New referral needs to be placed    Copied from [place] [phone]. Topic: Referral - Status >> Jun 07, 2022  1:25 PM Sabas Sous wrote: Reason for CRM: Pt's sister called requesting to speak to referrals regarding the patient's recent referral for Psychiatry. The current provider is not accepting new patients.  Best contact: [phone]

## 2022-06-14 ENCOUNTER — Other Ambulatory Visit: Payer: Self-pay

## 2022-06-14 ENCOUNTER — Ambulatory Visit: Payer: 59 | Admitting: Psychologist

## 2022-06-15 ENCOUNTER — Other Ambulatory Visit: Payer: Self-pay

## 2022-06-22 ENCOUNTER — Ambulatory Visit: Payer: 59 | Attending: Internal Medicine | Admitting: Internal Medicine

## 2022-06-22 ENCOUNTER — Encounter: Payer: Self-pay | Admitting: Internal Medicine

## 2022-06-22 ENCOUNTER — Other Ambulatory Visit: Payer: Self-pay

## 2022-06-22 VITALS — BP 116/79 | HR 82 | Temp 97.9°F | Ht 70.0 in | Wt 157.0 lb

## 2022-06-22 DIAGNOSIS — E1169 Type 2 diabetes mellitus with other specified complication: Secondary | ICD-10-CM | POA: Diagnosis not present

## 2022-06-22 DIAGNOSIS — F321 Major depressive disorder, single episode, moderate: Secondary | ICD-10-CM | POA: Diagnosis not present

## 2022-06-22 DIAGNOSIS — E1159 Type 2 diabetes mellitus with other circulatory complications: Secondary | ICD-10-CM | POA: Diagnosis not present

## 2022-06-22 DIAGNOSIS — Z794 Long term (current) use of insulin: Secondary | ICD-10-CM

## 2022-06-22 DIAGNOSIS — I152 Hypertension secondary to endocrine disorders: Secondary | ICD-10-CM

## 2022-06-22 DIAGNOSIS — E1165 Type 2 diabetes mellitus with hyperglycemia: Secondary | ICD-10-CM | POA: Diagnosis not present

## 2022-06-22 DIAGNOSIS — E785 Hyperlipidemia, unspecified: Secondary | ICD-10-CM

## 2022-06-22 DIAGNOSIS — F02818 Dementia in other diseases classified elsewhere, unspecified severity, with other behavioral disturbance: Secondary | ICD-10-CM

## 2022-06-22 LAB — POCT GLYCOSYLATED HEMOGLOBIN (HGB A1C): HbA1c, POC (controlled diabetic range): 13.1 % — AB (ref 0.0–7.0)

## 2022-06-22 LAB — GLUCOSE, POCT (MANUAL RESULT ENTRY): POC Glucose: 448 mg/dl — AB (ref 70–99)

## 2022-06-22 MED ORDER — INSULIN ASPART 100 UNIT/ML IJ SOLN: 2.0000 [IU] | Freq: Once | INTRAMUSCULAR | Status: DC

## 2022-06-22 MED ORDER — LANTUS SOLOSTAR 100 UNIT/ML ~~LOC~~ SOPN
10.0000 [IU] | PEN_INJECTOR | Freq: Every evening | SUBCUTANEOUS | 99 refills | Status: DC
  Filled 2022-06-22: qty 15, 115d supply, fill #0

## 2022-06-22 NOTE — Patient Instructions (Addendum)
Stop glimepiride. Start Lantus insulin 10 units at bedtime.   Take metformin 500 mg twice a day.  Check blood sugars at least once a day before breakfast or before dinner and bring readings with you on next visit to see our clinical pharmacist in 1 month. Please let me know if you change your mind about getting a continuous glucose monitor.  Please discontinue sugary drinks like sodas and juices.  Try to cut back on eating sweet snacks.  Eat fruits or nuts instead.

## 2022-06-22 NOTE — Addendum Note (Signed)
Addended by: Karle Plumber B on: 06/22/2022 01:45 PM   Modules accepted: Orders

## 2022-06-22 NOTE — Progress Notes (Addendum)
Patient ID: [name], male    DOB: 1970/06/20  MRN: [id number]  CC: Diabetes (DM f/u. [name] received flu vax. )   Subjective: [name] is a 52 y.o. male who presents for chronic ds management.  Sister, [name], is with him today and provides most of the history. His concerns today include:  Pt with hx of DM type 2, Sz disorder, PUD (GIB 04/2019), HTN, fatty liver, HL, major cognitive disorder (patient had neuropsychiatric evaluation done by [name] 11/2019).   DM: Results for orders placed or performed in visit on 06/22/22  POCT glucose (manual entry)  Result Value Ref Range   POC Glucose 448 (A) 70 - 99 mg/dl  POCT glycosylated hemoglobin (Hb A1C)  Result Value Ref Range   Hemoglobin A1C     HbA1c POC (<> result, manual entry)     HbA1c, POC (prediabetic range)     HbA1c, POC (controlled diabetic range) 13.1 (A) 0.0 - 7.0 %  Last A1C was 7.2 Patient is supposed to be on metformin 500 mg twice a day, Amaryl 2 mg daily and Trulicity 1.5 mg once a week. Eating more cookies candies and soda and hides them in his room Taking and tolerating Lipitor  HTN: Taking lisinopril 2.5 mg as prescribed. No CP/SOB/LE edema  Dermatitis:  Claritin changed to Hydroxyzine by clinical pharmacist in 04/2022.  Sister reports this has helped a lot.  Using Triamcinolone cream QOD  Saw [name] 04/23/2022. She referred pt to [facility] for depression/anx.  Had appt scheduled with [name] 06/14/2022 but was called and told they are not taking new pts.   -still taking Lexapro, increased to 20 mg.  Doing better on increase dose per sister, not as much outburst.  No SI per pt.  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 ([place]) 06/22/2022   Moderate major depression ([place]) 10/13/2021   Neurocognitive disorder 03/18/2020   Elevated blood pressure reading 08/24/2019   Memory changes 05/25/2019   Non-traumatic  rhabdomyolysis    Acute GI bleeding 04/21/2019   Diabetes mellitus type 2 in nonobese ([place]) 04/21/2019   Seizures ([place]) 04/19/2019   Acute blood loss anemia    Duodenal ulcer hemorrhage    Acute gastric ulcer without hemorrhage or perforation    Acute renal failure (ARF) ([employer]) [id number]   Acute metabolic encephalopathy [id number]     Current Outpatient Medications on File Prior to Visit  Medication Sig Dispense Refill   ACCU-CHEK GUIDE test strip USE AS INSTRUCTED 100 strip 12   Accu-Chek Softclix Lancets lancets Please check CBG in morning before eating, 2 hours after a meal and a bed time. TID 100 each 0   atorvastatin (LIPITOR) 20 MG tablet TAKE 1 TABLET BY MOUTH EVERY DAY 90 tablet 0   Blood Glucose Monitoring Suppl (ACCU-CHEK GUIDE ME) w/Device KIT USE AS DIRECTED 1 kit 0   Dulaglutide (TRULICITY) 1.5 0000000 SOPN Inject 1.5 mg into the skin once a week. 2 mL 6   escitalopram (LEXAPRO) 20 MG tablet Take 1 tablet (20 mg total) by mouth daily. 90 tablet 3   hydrOXYzine (VISTARIL) 25 MG capsule TAKE 1 CAPSULE BY MOUTH AT BEDTIME. 90 capsule 0   levETIRAcetam (KEPPRA) 500 MG tablet Take 1 tablet twice a day 180 tablet 3   lisinopril (ZESTRIL) 2.5 MG tablet TAKE 1 TABLET BY MOUTH EVERY DAY 90 tablet 1   metFORMIN (GLUCOPHAGE) 500 MG  tablet TAKE 1 TABLET BY MOUTH 2 TIMES DAILY WITH A MEAL. 180 tablet 3   pantoprazole (PROTONIX) 40 MG tablet TAKE 1 TABLET BY MOUTH TWICE A DAY 180 tablet 0   triamcinolone cream (KENALOG) 0.1 % Apply to affected area BID PRN 60 g 1   No current facility-administered medications on file prior to visit.    Allergies  Allergen Reactions   Penicillins Hives and Other (See Comments)    Childhood allergy  Did it involve swelling of the face/tongue/throat, SOB, or low BP? Unknown Did it involve sudden or severe rash/hives, skin peeling, or any reaction on the inside of your mouth or nose? Unknown Did you need to seek medical attention at a hospital or  doctor's office? Unknown When did it last happen?      Pt was a child If all above answers are "NO", may proceed with cephalosporin use. Sister states he just breaks out in hives unsure about other reactions.     Prozac [Fluoxetine]     Agitated and belligerent towards family members    Social History   Socioeconomic History   Marital status: Single    Spouse name: Not on file   Number of children: Not on file   Years of education: Not on file   Highest education level: Not on file  Occupational History   Not on file  Tobacco Use   Smoking status: Never   Smokeless tobacco: Never  Vaping Use   Vaping Use: Never used  Substance and Sexual Activity   Alcohol use: Not Currently   Drug use: Not Currently    Comment: 5 times per month (per 04/21/19 hospital note)   Sexual activity: Not on file  Other Topics Concern   Not on file  Social History Narrative   Left handed   Drinks caffeine   Single story home   Social Determinants of Health   Financial Resource Strain: Not on file  Food Insecurity: Not on file  Transportation Needs: Not on file  Physical Activity: Not on file  Stress: Not on file  Social Connections: Not on file  Intimate Partner Violence: Not on file    Family History  Problem Relation Age of Onset   Diabetes Mother    Diabetes Sister    Diabetes Father    Esophageal cancer Neg Hx    Stomach cancer Neg Hx    Colon cancer Neg Hx    Pancreatic cancer Neg Hx     Past Surgical History:  Procedure Laterality Date   BIOPSY  04/17/2019   Procedure: BIOPSY;  Surgeon: Irene Shipper, MD;  Location: [place];  Service: Endoscopy;;   ESOPHAGOGASTRODUODENOSCOPY Left 04/21/2019   Procedure: ESOPHAGOGASTRODUODENOSCOPY (EGD);  Surgeon: Jerene Bears, MD;  [facility]COPY;  Service: Gastroenterology;  Laterality: Left;   ESOPHAGOGASTRODUODENOSCOPY (EGD) WITH PROPOFOL N/A 04/17/2019   Procedure: ESOPHAGOGASTRODUODENOSCOPY (EGD) WITH PROPOFOL;  Surgeon:  Irene Shipper, MD;  Location: [facility] ENDOSCOPY;  Service: Endoscopy;  Laterality: N/A;   ESOPHAGOGASTRODUODENOSCOPY (EGD) WITH PROPOFOL N/A 04/23/2019   Procedure: ESOPHAGOGASTRODUODENOSCOPY (EGD) WITH PROPOFOL;  Surgeon: Jerene Bears, MD;  Location[facility] ENDOSCOPY;  Service: Gastroenterology;  Laterality: N/A;   HEMOSTASIS CLIP PLACEMENT  04/17/2019   Procedure: HEMOSTASIS CLIP PLACEMENT;  Surgeon: Irene Shipper, MD;  Location: [place];  Service: Endoscopy;;   HEMOSTASIS CLIP PLACEMENT  04/21/2019   Procedure: HEMOSTASIS CLIP PLACEMENT;  Surgeon: Jerene Bears, MD;  Location: [place];  Service: Gastroenterology;;   IR  FLUORO GUIDE CV LINE RIGHT  04/10/2019   IR REMOVAL TUN CV CATH W/O FL  04/17/2019   IR US GUIDE VASC ACCESS RIGHT  04/10/2019   SUBMUCOSAL INJECTION  04/17/2019   Procedure: SUBMUCOSAL INJECTION;  Surgeon: Irene Shipper, MD;  Location: [facility] ENDOSCOPY;  Service: Endoscopy;;    ROS: Review of Systems Negative except as stated above  PHYSICAL EXAM: BP 116/79 (BP Location: Left Arm, Patient Position: Sitting, Cuff Size: Normal)   Pulse 82   Temp 97.9 F (36.6 C) (Oral)   Ht 5\' 10"  (1.778 m)   Wt 157 lb (71.2 kg)   SpO2 96%   BMI 22.53 kg/m   Physical Exam   General appearance -middle-age male in NAD. Mental status -patient is very withdrawn and quiet.  Poor eye contact when talking to him. Mouth - mucous membranes moist, pharynx normal without lesions Neck - supple, no significant adenopathy Chest - clear to auscultation, no wheezes, rales or rhonchi, symmetric air entry Heart - normal rate, regular rhythm, normal S1, S2, no murmurs, rubs, clicks or gallops Extremities - peripheral pulses normal, no pedal edema, no clubbing or cyanosis Diabetic Foot Exam - Simple   Simple Foot Form Diabetic Foot exam was performed with the following findings: Yes 06/22/2022  9:19 AM  Visual Inspection See comments: Yes Sensation Testing Intact to touch and monofilament testing  bilaterally: Yes Pulse Check Posterior Tibialis and Dorsalis pulse intact bilaterally: Yes Comments Small calyces on the medial aspect of both big toes.        06/22/2022    8:42 AM 04/23/2022    1:53 PM 02/16/2022    8:50 AM  Depression screen PHQ 2/9  Decreased Interest 2 0 1  Down, Depressed, Hopeless 1 0 1  PHQ - 2 Score 3 0 2  Altered sleeping 2  1  Tired, decreased energy 1  1  Change in appetite 1  1  Feeling bad or failure about yourself  0  0  Trouble concentrating 0  1  Moving slowly or fidgety/restless 0  0  Suicidal thoughts 0  0  PHQ-9 Score 7  6      06/22/2022    8:43 AM 02/16/2022    8:50 AM 01/26/2022    2:11 PM 10/13/2021   11:21 AM  GAD 7 : Generalized Anxiety Score  Nervous, Anxious, on Edge 2 1 0 1  Control/stop worrying 2 1 0 0  Worry too much - different things 2 1 0 0  Trouble relaxing 1 0 0 0  Restless 1 1 0 0  Easily annoyed or irritable 2 1 0 2  Afraid - awful might happen 0 0 0 0  Total GAD 7 Score 10 5 0 3        Latest Ref Rng & Units 01/26/2022    3:27 PM 10/13/2021   12:18 PM 02/10/2021    8:35 AM  CMP  Glucose 70 - 99 mg/dL 133  289  417   BUN 6 - 24 mg/dL 16  14  7    Creatinine 0.76 - 1.27 mg/dL 1.26  0.98  0.96   Sodium 134 - 144 mmol/L 139  133  132   Potassium 3.5 - 5.2 mmol/L 4.3  4.3  4.4   Chloride 96 - 106 mmol/L 99  95  92   CO2 20 - 29 mmol/L 23  23  26    Calcium 8.7 - 10.2 mg/dL 9.7  9.7  9.3   Total Protein  6.0 - 8.5 g/dL 7.1   6.5   Total Bilirubin 0.0 - 1.2 mg/dL 0.5   0.5   Alkaline Phos 44 - 121 IU/L 85   106   AST 0 - 40 IU/L 25   23   ALT 0 - 44 IU/L 32   25    Lipid Panel     Component Value Date/Time   CHOL 130 01/26/2022 1527   TRIG 128 01/26/2022 1527   HDL 23 (L) 01/26/2022 1527   CHOLHDL 5.7 (H) 01/26/2022 1527   LDLCALC 84 01/26/2022 1527    CBC    Component Value Date/Time   WBC 7.4 01/26/2022 1527   WBC 7.3 10/10/2019 0027   RBC 5.39 01/26/2022 1527   RBC 4.98 10/10/2019 0027   HGB  13.9 01/26/2022 1527   HCT 42.0 01/26/2022 1527   PLT 246 01/26/2022 1527   MCV 78 (L) 01/26/2022 1527   MCH 25.8 (L) 01/26/2022 1527   MCH 25.3 (L) 10/10/2019 0027   MCHC 33.1 01/26/2022 1527   MCHC 32.2 10/10/2019 0027   RDW 13.6 01/26/2022 1527   LYMPHSABS 2.1 10/09/2019 1304   MONOABS 0.7 10/09/2019 1304   EOSABS 0.9 (H) 10/09/2019 1304   BASOSABS 0.1 10/09/2019 1304    ASSESSMENT AND PLAN:  1. Type 2 diabetes mellitus with hyperglycemia, with long-term current use of insulin (HCC) Not at goal due to dietary indiscretions. I had a long talk with the patient and got him to buy in on cutting out the sodas and cutting back on sugary snacks. Stop Amaryl.  Continue Trulicity to 1.5 mg weekly, metformin 500 mg twice a day.  Add glargine insulin 10 units at bedtime. Patient declines continuous glucose monitor.  He is agreeable to having blood sugars checked at least once a day before breakfast or before dinner.  Follow-up with clinical pharmacist in 1 month with blood sugar readings. - POCT glucose (manual entry) - POCT glycosylated hemoglobin (Hb A1C) - insulin glargine (LANTUS SOLOSTAR) 100 UNIT/ML Solostar Pen; Inject 10 units into the skin Nightly.After 2 days, if a.m. blood sugars still greater than 150, increase to 13 units.  Dispense: 15 mL; Refill: PRN - Microalbumin / creatinine urine ratio - Comprehensive metabolic panel - insulin aspart (novoLOG) injection 2 Units  2. Hypertension associated with type 2 diabetes mellitus (HCC) At goal.  Continue lisinopril 2.5 mg daily.  3. Hyperlipidemia associated with type 2 diabetes mellitus (HCC) Continue atorvastatin.  4. Moderate major depression (HCC) Doing okay on Prozac. Referral submitted to behavioral health. - Ambulatory referral to Psychiatry  5. 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  Patient was given the opportunity to ask questions.  Patient verbalized understanding of the plan and was able to repeat key elements of the plan.   This documentation was completed using Radio producer.  Any transcriptional errors are unintentional.  Orders Placed This Encounter  Procedures   Microalbumin / creatinine urine ratio   Comprehensive metabolic panel   Ambulatory referral to Psychiatry   POCT glucose (manual entry)   POCT glycosylated hemoglobin (Hb A1C)     Requested Prescriptions   Signed Prescriptions Disp Refills   insulin glargine (LANTUS SOLOSTAR) 100 UNIT/ML Solostar Pen 15 mL PRN    Sig: Inject 10 units into the skin Nightly.After 2 days, if a.m. blood sugars still greater than 150, increase  to 13 units.    Return in about 4 months (around 10/22/2022) for Appt with [facility] in 4 wks for BS check.  Karle Plumber, MD, FACP

## 2022-06-23 ENCOUNTER — Other Ambulatory Visit: Payer: Self-pay | Admitting: Internal Medicine

## 2022-06-23 DIAGNOSIS — E1165 Type 2 diabetes mellitus with hyperglycemia: Secondary | ICD-10-CM

## 2022-06-23 MED ORDER — TRESIBA FLEXTOUCH 100 UNIT/ML ~~LOC~~ SOPN: 10.0000 [IU] | PEN_INJECTOR | Freq: Every day | SUBCUTANEOUS | 0 refills | Status: DC

## 2022-06-23 MED ORDER — LANTUS SOLOSTAR 100 UNIT/ML ~~LOC~~ SOPN: 10.0000 [IU] | PEN_INJECTOR | Freq: Every evening | SUBCUTANEOUS | 99 refills | Status: DC

## 2022-06-23 NOTE — Telephone Encounter (Signed)
Wanted to make you aware. Pt's insurance prefers [place] or 70/30 Novolog. To keep him on a daily basal insulin, I sent [place] per auto-substitution policy.

## 2022-06-25 ENCOUNTER — Other Ambulatory Visit: Payer: Self-pay

## 2022-06-25 LAB — COMPREHENSIVE METABOLIC PANEL
ALT: 27 IU/L (ref 0–44)
AST: 21 IU/L (ref 0–40)
Albumin/Globulin Ratio: 1.6 (ref 1.2–2.2)
Albumin: 4.3 g/dL (ref 3.8–4.9)
Alkaline Phosphatase: 110 IU/L (ref 44–121)
BUN/Creatinine Ratio: 12 (ref 9–20)
BUN: 13 mg/dL (ref 6–24)
Bilirubin Total: 0.5 mg/dL (ref 0.0–1.2)
CO2: 22 mmol/L (ref 20–29)
Calcium: 9.7 mg/dL (ref 8.7–10.2)
Chloride: 95 mmol/L — ABNORMAL LOW (ref 96–106)
Creatinine, Ser: 1.08 mg/dL (ref 0.76–1.27)
Globulin, Total: 2.7 g/dL (ref 1.5–4.5)
Glucose: 377 mg/dL — ABNORMAL HIGH (ref 70–99)
Potassium: 4.1 mmol/L (ref 3.5–5.2)
Sodium: 133 mmol/L — ABNORMAL LOW (ref 134–144)
Total Protein: 7 g/dL (ref 6.0–8.5)
eGFR: 83 mL/min/{1.73_m2} (ref >59)

## 2022-06-25 LAB — MICROALBUMIN / CREATININE URINE RATIO
Creatinine, Urine: 64.8 mg/dL
Microalb/Creat Ratio: 27 mg/g creat (ref 0–29)
Microalbumin, Urine: 17.2 ug/mL

## 2022-07-13 ENCOUNTER — Other Ambulatory Visit: Payer: Self-pay | Admitting: Internal Medicine

## 2022-07-13 ENCOUNTER — Other Ambulatory Visit: Payer: Self-pay

## 2022-07-13 ENCOUNTER — Encounter: Payer: Self-pay | Admitting: Internal Medicine

## 2022-07-13 MED ORDER — TRULICITY 0.75 MG/0.5ML ~~LOC~~ SOAJ: 1.5000 mg | SUBCUTANEOUS | 5 refills | Status: DC

## 2022-07-13 NOTE — Telephone Encounter (Signed)
Requested medications are due for refill today.  See note  Requested medications are on the active medications list.  yes  Last refill. 07/13/2022  Future visit scheduled.   yes  Notes to clinic.  Pharmacy comment: Script Clarification:PER INSUURANCE REQUIREMENT CANNOT DISPENSE 0.75 TO INJECT TWICE PLEASE SEND ALTERNATIVE.     Requested Prescriptions  Pending Prescriptions Disp Refills   TRULICITY 0.75 MG/0.5ML SOPN [Pharmacy Med Name: TRULICITY 0.75 MG/0.5 ML PEN]  5    Sig: Inject 1.5 mg into the skin once a week.     Endocrinology:  Diabetes - GLP-1 Receptor Agonists Failed - 07/13/2022  4:06 PM      Failed - HBA1C is between 0 and 7.9 and within 180 days    HbA1c, POC (controlled diabetic range)  Date Value Ref Range Status  06/22/2022 13.1 (A) 0.0 - 7.0 % Final         Passed - Valid encounter within last 6 months    Recent Outpatient Visits           3 weeks ago Type 2 diabetes mellitus with hyperglycemia, with long-term current use of insulin [facility])   [place] [facility] Marcine Matar, MD   2 months ago Encounter for [employer] annual wellness exam   [facility] [place], [place] L, RPH-CPP   4 months ago Diabetes mellitus type 2 in nonobese [facility])   [place] [facility] Jonah Blue B, MD   5 months ago Blood in stool   [facility] Jonah Blue B, MD   9 months ago Diabetes mellitus type 2 in nonobese [facility])   [facility] Marcine Matar, MD       Future Appointments             In 2 weeks Lois Huxley, Cornelius Moras, RPH-CPP [facility]   In 3 months Laural Benes, Binnie Rail, MD [facility]

## 2022-07-16 ENCOUNTER — Other Ambulatory Visit: Payer: Self-pay

## 2022-07-16 ENCOUNTER — Other Ambulatory Visit: Payer: Self-pay | Admitting: Pharmacist

## 2022-07-16 DIAGNOSIS — E119 Type 2 diabetes mellitus without complications: Secondary | ICD-10-CM

## 2022-07-16 MED ORDER — TRULICITY 1.5 MG/0.5ML ~~LOC~~ SOAJ
1.5000 mg | SUBCUTANEOUS | 6 refills | Status: DC
Start: 2022-07-16 — End: 2022-07-27

## 2022-07-16 NOTE — Telephone Encounter (Signed)
Requested medication (s) are due for refill today: No  Requested medication (s) are on the active medication list: Yes  Last refill:  07/13/22  Future visit scheduled: Yes  Notes to clinic:  See pharmacy request.    Requested Prescriptions  Pending Prescriptions Disp Refills   TRULICITY 0.75 MG/0.5ML SOPN [Pharmacy Med Name: TRULICITY 0.75 MG/0.5 ML PEN]  5    Sig: Inject 1.5 mg into the skin once a week.     Endocrinology:  Diabetes - GLP-1 Receptor Agonists Failed - 07/13/2022  5:59 PM      Failed - HBA1C is between 0 and 7.9 and within 180 days    HbA1c, POC (controlled diabetic range)  Date Value Ref Range Status  06/22/2022 13.1 (A) 0.0 - 7.0 % Final         Passed - Valid encounter within last 6 months    Recent Outpatient Visits           3 weeks ago Type 2 diabetes mellitus with hyperglycemia, with long-term current use of insulin [facility])   [place] [facility] Marcine Matar, MD   2 months ago Encounter for [employer] annual wellness exam   [facility] [place], [place] L, RPH-CPP   5 months ago Diabetes mellitus type 2 in nonobese [facility])   [facility] Jonah Blue B, MD   5 months ago Blood in stool   [facility] Jonah Blue B, MD   9 months ago Diabetes mellitus type 2 in nonobese [facility])   [place] [facility] Marcine Matar, MD       Future Appointments             In 1 week Lois Huxley, Cornelius Moras, RPH-CPP [place] [facility]   In 3 months Laural Benes, Binnie Rail, MD [facility]

## 2022-07-20 ENCOUNTER — Other Ambulatory Visit: Payer: Self-pay

## 2022-07-27 ENCOUNTER — Encounter: Payer: Self-pay | Admitting: Pharmacist

## 2022-07-27 ENCOUNTER — Ambulatory Visit: Payer: 59 | Attending: Internal Medicine | Admitting: Pharmacist

## 2022-07-27 DIAGNOSIS — Z794 Long term (current) use of insulin: Secondary | ICD-10-CM | POA: Diagnosis not present

## 2022-07-27 DIAGNOSIS — E1165 Type 2 diabetes mellitus with hyperglycemia: Secondary | ICD-10-CM

## 2022-07-27 DIAGNOSIS — L309 Dermatitis, unspecified: Secondary | ICD-10-CM | POA: Diagnosis not present

## 2022-07-27 MED ORDER — TRIAMCINOLONE ACETONIDE 0.1 % EX CREA
TOPICAL_CREAM | CUTANEOUS | 1 refills | Status: DC
Start: 2022-07-27 — End: 2022-08-31

## 2022-07-27 MED ORDER — TRESIBA FLEXTOUCH 100 UNIT/ML ~~LOC~~ SOPN: 16.0000 [IU] | PEN_INJECTOR | Freq: Every day | SUBCUTANEOUS | 1 refills | Status: DC

## 2022-07-27 MED ORDER — SEMAGLUTIDE (1 MG/DOSE) 4 MG/3ML ~~LOC~~ SOPN
1.0000 mg | PEN_INJECTOR | SUBCUTANEOUS | 6 refills | Status: DC
Start: 2022-07-27 — End: 2022-08-31

## 2022-07-27 NOTE — Progress Notes (Signed)
S:     52 y.o. male who presents for diabetes evaluation, education, and management.  PMH is significant for T2DM, HTN.   Patient was referred and last seen by Primary Care Provider, Dr. Laural Benes, on June 22, 2022.  At last visit, A1c was 13.1% up from 7.2%. Patient reported taking metformin, glipizide, and Trulicity.  Endorsed dietary indiscretions with cookies and sodas. At that visit, discontinued glipizide and started insulin degludec 10 units daily.   Today, patient arrives in good spirits and presents without any assistance with his sister. Reports has been out of Trulicity for 2 weeks. Reports has noticed decreased appetite and has been feeling more tired.   Family/Social History:  Diabetes in mother, father and sister  Current diabetes medications include: Trulicity 1.5 mg weekly (not taking, unavailable), insulin degludec Evaristo Bury) 10 units daily, metformin 500 mg BID Current hypertension medications include: lisinopril 2.5 mg daily Current hyperlipidemia medications include: atorvastatin 20 mg daily  Patient reports adherence to taking all medications as prescribed. Reports has been out of Trulicity for 2 weeks due to shortage.   Patient denies hypoglycemic events.  Reported home fasting blood sugars: 200-500s   Patient denies nocturia (nighttime urination).  Patient denies neuropathy (nerve pain). Patient denies visual changes. Patient reports self foot exams.   Patient reported dietary habits: Eats 1 meal/day and snacks throughout the day. Reports has cut back on cookies, does endorse drinking sodas.  O:   Patient brought meter and blood glucose: 200-500s  Lab Results  Component Value Date   HGBA1C 13.1 (A) 06/22/2022   There were no vitals filed for this visit.  Lipid Panel     Component Value Date/Time   CHOL 130 01/26/2022 1527   TRIG 128 01/26/2022 1527   HDL 23 (L) 01/26/2022 1527   CHOLHDL 5.7 (H) 01/26/2022 1527   LDLCALC 84 01/26/2022 1527     Clinical Atherosclerotic Cardiovascular Disease (ASCVD): No  The 10-year ASCVD risk score (Arnett DK, et al., 2019) is: 9.6%   Values used to calculate the score:     Age: 14 years     Sex: Male     Is Non-Hispanic African American: No     Diabetic: Yes     Tobacco smoker: No     Systolic Blood Pressure: 116 mmHg     Is BP treated: Yes     HDL Cholesterol: 23 mg/dL     Total Cholesterol: 130 mg/dL   A/P: Diabetes longstanding currently uncontrolled based on A1c. Patient is able to verbalize appropriate hypoglycemia management plan. Medication adherence appears appropriate. Control is suboptimal due to being out of Trulicity and dietary indiscretions. -Increased dose of basal insulin Tresiba (insulin degludec) from 10 units to 16 units daily. -Started GLP-1 Ozempic (semaglutide) 1 mg weekly.  -Stopped Trulicity -Continued metformin 500 mg BID.  -Patient educated on purpose, proper use, and potential adverse effects of Ozempic.  -Extensively discussed pathophysiology of diabetes, recommended lifestyle interventions, dietary effects on blood sugar control.  -Counseled on s/sx of and management of hypoglycemia.  -Next A1c anticipated June 2024.   Hypertension currently above goal on current medications. BP goal < 130/80 mmHg. Reported home readings SBP 160-180. Prioritized diabetes at this visit. Will defer hypertension management to next visit.  Written patient instructions provided. Patient verbalized understanding of treatment plan.  Total time in face to face counseling 20 minutes.    Follow-up:  Pharmacist 1 month. PCP clinic visit in July 2024.   Georga Hacking, Pharm.D  PGY1 Pharmacy Resident 07/27/2022 8:10 AM

## 2022-07-30 ENCOUNTER — Other Ambulatory Visit: Payer: Self-pay | Admitting: Internal Medicine

## 2022-07-30 ENCOUNTER — Telehealth: Payer: Self-pay | Admitting: Internal Medicine

## 2022-07-30 DIAGNOSIS — E119 Type 2 diabetes mellitus without complications: Secondary | ICD-10-CM

## 2022-07-30 DIAGNOSIS — K279 Peptic ulcer, site unspecified, unspecified as acute or chronic, without hemorrhage or perforation: Secondary | ICD-10-CM

## 2022-07-30 DIAGNOSIS — E785 Hyperlipidemia, unspecified: Secondary | ICD-10-CM

## 2022-07-30 NOTE — Telephone Encounter (Signed)
Pt's sister called in, states needs auth for  Semaglutide, 1 MG/DOSE, 4 MG/3ML SOPN

## 2022-07-31 ENCOUNTER — Encounter: Payer: Self-pay | Admitting: Internal Medicine

## 2022-07-31 NOTE — Telephone Encounter (Signed)
Requested Prescriptions  Pending Prescriptions Disp Refills   hydrOXYzine (VISTARIL) 25 MG capsule [Pharmacy Med Name: HYDROXYZINE PAM 25 MG CAP] 90 capsule 0    Sig: TAKE 1 CAPSULE BY MOUTH EVERYDAY AT BEDTIME     Ear, Nose, and Throat:  Antihistamines 2 Passed - 07/30/2022  6:47 PM      Passed - Cr in normal range and within 360 days    Creatinine, Ser  Date Value Ref Range Status  06/22/2022 1.08 0.76 - 1.27 mg/dL Final   Creatinine,U  Date Value Ref Range Status  10/13/2007 191.4 mg/dL Final    Comment:    See lab report for associated comment(s)   Creatinine, Urine  Date Value Ref Range Status  04/05/2019 155.79 mg/dL Final    Comment:    Performed at [facility], [address] [place], [place] [address]         Passed - Valid encounter within last 12 months    Recent Outpatient Visits           4 days ago Type 2 diabetes mellitus with hyperglycemia, with long-term current use of insulin   [facility] [place], Hailey L, RPH-CPP   1 month ago Type 2 diabetes mellitus with hyperglycemia, with long-term current use of insulin [facility])   [place] [facility] Marcine Matar, MD   3 months ago Encounter for [employer] annual wellness exam   [facility] [place], Ursina L, RPH-CPP   5 months ago Diabetes mellitus type 2 in nonobese [facility])   [place] [facility] Jonah Blue B, MD   6 months ago Blood in stool   [facility] Marcine Matar, MD       Future Appointments             In 1 month Lois Huxley, Cornelius Moras, RPH-CPP [place] [facility]   In 2 months Marcine Matar, MD [place] [facility]             TRESIBA FLEXTOUCH 100 UNIT/ML FlexTouch Pen [Pharmacy Med Name: TRESIBA FLEXTOUCH 100 UNIT/ML]      Sig: INJECT 10  UNITS INTO THE SKIN DAILY     Endocrinology:  Diabetes - Insulins Failed - 07/30/2022  6:47 PM      Failed - HBA1C is between 0 and 7.9 and within 180 days    HbA1c, POC (controlled diabetic range)  Date Value Ref Range Status  06/22/2022 13.1 (A) 0.0 - 7.0 % Final         Passed - Valid encounter within last 6 months    Recent Outpatient Visits           4 days ago Type 2 diabetes mellitus with hyperglycemia, with long-term current use of insulin   [facility] [place], [place] L, RPH-CPP   1 month ago Type 2 diabetes mellitus with hyperglycemia, with long-term current use of insulin [facility])   [place] Marcine Matar, MD   3 months ago Encounter for [employer] annual wellness exam   [facility] Center [place], [place] L, RPH-CPP   5 months ago Diabetes mellitus type 2 in nonobese [facility])    [facility] Marcine Matar, MD  6 months ago Blood in stool   [facility] Marcine Matar, MD       Future Appointments             In 1 month Lois Huxley, Cornelius Moras, RPH-CPP [place] [facility]   In 2 months Marcine Matar, MD [place] [facility]             glimepiride (AMARYL) 2 MG tablet [Pharmacy Med Name: GLIMEPIRIDE 2 MG TABLET] 90 tablet 0    Sig: TAKE 1 TABLET BY MOUTH EVERY DAY BEFORE BREAKFAST     Endocrinology:  Diabetes - Sulfonylureas Failed - 07/30/2022  6:47 PM      Failed - HBA1C is between 0 and 7.9 and within 180 days    HbA1c, POC (controlled diabetic range)  Date Value Ref Range Status  06/22/2022 13.1 (A) 0.0 - 7.0 % Final         Passed - Cr in normal range and within 360 days    Creatinine, Ser  Date Value Ref Range Status  06/22/2022 1.08 0.76 - 1.27 mg/dL Final   Creatinine,U  Date Value Ref Range Status   10/13/2007 191.4 mg/dL Final    Comment:    See lab report for associated comment(s)   Creatinine, Urine  Date Value Ref Range Status  04/05/2019 155.79 mg/dL Final    Comment:    Performed at [facility], [address], [place], [place] [address]         Passed - Valid encounter within last 6 months    Recent Outpatient Visits           4 days ago Type 2 diabetes mellitus with hyperglycemia, with long-term current use of insulin   [facility] [place], [place] L, RPH-CPP   1 month ago Type 2 diabetes mellitus with hyperglycemia, with long-term current use of insulin [facility])   [place] [facility] Marcine Matar, MD   3 months ago Encounter for [employer] annual wellness exam   [facility] [place], [place] L, RPH-CPP   5 months ago Diabetes mellitus type 2 in nonobese [facility])   [facility] Jonah Blue B, MD   6 months ago Blood in stool   [facility] Marcine Matar, MD       Future Appointments             In 1 month Lois Huxley, Cornelius Moras, RPH-CPP [place] [facility]   In 2 months Marcine Matar, MD [place] [facility]             pantoprazole (PROTONIX) 40 MG tablet [Pharmacy Med Name: PANTOPRAZOLE SOD DR 40 MG TAB] 180 tablet 0    Sig: TAKE 1 TABLET BY MOUTH TWICE A DAY     Gastroenterology: Proton Pump Inhibitors Passed - 07/30/2022  6:47 PM      Passed - Valid encounter within last 12 months    Recent Outpatient Visits           4 days ago Type 2 diabetes mellitus with hyperglycemia, with long-term current use of insulin   [facility] Center [place], Fernando [place] L, RPH-CPP   1 month ago Type 2 diabetes  mellitus with hyperglycemia, with long-term current use of insulin [facility])    [facility] Marcine Matar, MD   3 months ago Encounter for [employer] annual wellness exam   [facility] [place], [place] L, RPH-CPP   5 months ago Diabetes mellitus type 2 in nonobese [facility])   [place] [facility] Jonah Blue B, MD   6 months ago Blood in stool   [facility] Marcine Matar, MD       Future Appointments             In 1 month Lois Huxley, Cornelius Moras, RPH-CPP [place] [facility]   In 2 months Marcine Matar, MD [place] [facility]             atorvastatin (LIPITOR) 20 MG tablet [Pharmacy Med Name: ATORVASTATIN 20 MG TABLET] 90 tablet 0    Sig: TAKE 1 TABLET BY MOUTH EVERY DAY     Cardiovascular:  Antilipid - Statins Failed - 07/30/2022  6:47 PM      Failed - Lipid Panel in normal range within the last 12 months    Cholesterol, Total  Date Value Ref Range Status  01/26/2022 130 100 - 199 mg/dL Final   LDL Chol Calc (NIH)  Date Value Ref Range Status  01/26/2022 84 0 - 99 mg/dL Final   HDL  Date Value Ref Range Status  01/26/2022 23 (L) >39 mg/dL Final   Triglycerides  Date Value Ref Range Status  01/26/2022 128 0 - 149 mg/dL Final         Passed - Patient is not pregnant      Passed - Valid encounter within last 12 months    Recent Outpatient Visits           4 days ago Type 2 diabetes mellitus with hyperglycemia, with long-term current use of insulin   [facility] Center [place], [place] L, RPH-CPP   1 month ago Type 2 diabetes mellitus with hyperglycemia, with long-term current use of insulin [facility])   [place] [facility] Marcine Matar, MD   3 months ago Encounter for [employer] annual wellness exam   [facility] Center [place], Mendes L, RPH-CPP   5  months ago Diabetes mellitus type 2 in nonobese [facility])   [place] Jonah Blue B, MD   6 months ago Blood in stool   [facility] Marcine Matar, MD       Future Appointments             In 1 month Lois Huxley, Cornelius Moras, RPH-CPP  [facility]   In 2 months Laural Benes, Binnie Rail, MD [facility]

## 2022-08-01 ENCOUNTER — Other Ambulatory Visit: Payer: Self-pay

## 2022-08-30 ENCOUNTER — Other Ambulatory Visit: Payer: Self-pay | Admitting: Internal Medicine

## 2022-08-31 ENCOUNTER — Ambulatory Visit: Payer: 59 | Attending: Internal Medicine | Admitting: Pharmacist

## 2022-08-31 ENCOUNTER — Encounter: Payer: Self-pay | Admitting: Pharmacist

## 2022-08-31 DIAGNOSIS — Z794 Long term (current) use of insulin: Secondary | ICD-10-CM | POA: Diagnosis not present

## 2022-08-31 DIAGNOSIS — L309 Dermatitis, unspecified: Secondary | ICD-10-CM | POA: Diagnosis not present

## 2022-08-31 DIAGNOSIS — E1165 Type 2 diabetes mellitus with hyperglycemia: Secondary | ICD-10-CM

## 2022-08-31 MED ORDER — SEMAGLUTIDE (1 MG/DOSE) 4 MG/3ML ~~LOC~~ SOPN
1.0000 mg | PEN_INJECTOR | SUBCUTANEOUS | 1 refills | Status: AC
Start: 2022-08-31 — End: ?
  Filled 2022-09-07: qty 3, 28d supply, fill #0
  Filled 2022-10-01: qty 3, 28d supply, fill #1
  Filled 2022-10-26: qty 3, 28d supply, fill #2
  Filled 2022-11-27 – 2022-11-30 (×2): qty 3, 28d supply, fill #3
  Filled 2023-04-12: qty 3, 28d supply, fill #4

## 2022-08-31 MED ORDER — TRIAMCINOLONE ACETONIDE 0.1 % EX CREA
TOPICAL_CREAM | CUTANEOUS | 1 refills | Status: AC
Start: 2022-08-31 — End: ?

## 2022-08-31 MED ORDER — LISINOPRIL 2.5 MG PO TABS: 2.5000 mg | ORAL_TABLET | Freq: Every day | ORAL | 1 refills | Status: DC

## 2022-08-31 MED ORDER — TRESIBA FLEXTOUCH 100 UNIT/ML ~~LOC~~ SOPN: 16.0000 [IU] | PEN_INJECTOR | Freq: Every day | SUBCUTANEOUS | 1 refills | Status: DC

## 2022-08-31 NOTE — Progress Notes (Signed)
    S:    PCP: Dr. Laural Benes  52 y.o. male who presents for diabetes evaluation, education, and management.  PMH is significant for T2DM, HTN.   Patient was referred and last seen by Primary Care Provider, Dr. Laural Benes, on June 22, 2022.  We saw him 07/27/22 and changed his Trulicity to Ozempic.  Today, patient arrives in good spirits and presents with his sister. Since changing to Ozempic, he has noticed improvement in his blood sugar levels. He denies any NV, abdominal pain but can tell a decrease in his appetite. He denies any concerns at this time.   Family/Social History:  Diabetes in mother, father and sister  Current diabetes medications include: Ozempic 1mg  weekly, insulin degludec Evaristo Bury) 16 units daily, metformin 500 mg BID Current hypertension medications include: lisinopril 2.5 mg daily Current hyperlipidemia medications include: atorvastatin 20 mg daily  Patient reports adherence to taking all medications as prescribed.   Patient denies hypoglycemic events.  Patient denies nocturia (nighttime urination).  Patient denies neuropathy (nerve pain). Patient denies visual changes. Patient reports self foot exams.   Patient reported dietary habits: Eats 1 meal/day and snacks throughout the day. Reports has cut back on cookies, does endorse drinking sodas.  O:  Brings his home GM in for review:  -Avg, 30 day: 102 mg/dL  Lab Results  Component Value Date   HGBA1C 13.1 (A) 06/22/2022   There were no vitals filed for this visit.  Lipid Panel     Component Value Date/Time   CHOL 130 01/26/2022 1527   TRIG 128 01/26/2022 1527   HDL 23 (L) 01/26/2022 1527   CHOLHDL 5.7 (H) 01/26/2022 1527   LDLCALC 84 01/26/2022 1527    Clinical Atherosclerotic Cardiovascular Disease (ASCVD): No  The 10-year ASCVD risk score (Arnett DK, et al., 2019) is: 10.5%   Values used to calculate the score:     Age: 52 years     Sex: Male     Is Non-Hispanic African American: No      Diabetic: Yes     Tobacco smoker: No     Systolic Blood Pressure: 116 mmHg     Is BP treated: Yes     HDL Cholesterol: 23 mg/dL     Total Cholesterol: 130 mg/dL   A/P: Diabetes longstanding currently uncontrolled based on A1c, however, his GM shows goal home blood glucose levels. Patient is able to verbalize appropriate hypoglycemia management plan. Medication adherence appears appropriate.  -Continued current regimen. Refills given. -Patient educated on purpose, proper use, and potential adverse effects of Ozempic.  -Extensively discussed pathophysiology of diabetes, recommended lifestyle interventions, dietary effects on blood sugar control.  -Counseled on s/sx of and management of hypoglycemia.  -Next A1c anticipated June 2024.   Written patient instructions provided. Patient verbalized understanding of treatment plan.  Total time in face to face counseling 20 minutes.    Follow-up:  PCP clinic visit in July 2024.   Butch Penny, PharmD, P[place]y [place], CPP Clinical Pharmacist [facility] [phone]

## 2022-09-04 ENCOUNTER — Other Ambulatory Visit: Payer: Self-pay

## 2022-09-04 ENCOUNTER — Telehealth: Payer: Self-pay | Admitting: Internal Medicine

## 2022-09-04 DIAGNOSIS — E1165 Type 2 diabetes mellitus with hyperglycemia: Secondary | ICD-10-CM

## 2022-09-04 NOTE — Telephone Encounter (Signed)
Patients sister Vonna Kotyk called stated [facility] advised that before patient could get Semaglutide, 1 MG/DOSE, 4 MG/3ML SOPN he would need a prior auth. Please advise.  [id number] [place] [place] - [address] Phone: [phone]  Fax: [phone]

## 2022-09-04 NOTE — Telephone Encounter (Signed)
Prior Authorization needed, routing for approval.

## 2022-09-04 NOTE — Telephone Encounter (Signed)
FYI

## 2022-09-04 NOTE — Telephone Encounter (Signed)
Noted sister has been informed.

## 2022-09-07 ENCOUNTER — Other Ambulatory Visit: Payer: Self-pay

## 2022-09-07 MED ORDER — OZEMPIC (1 MG/DOSE) 4 MG/3ML ~~LOC~~ SOPN
1.0000 mg | PEN_INJECTOR | SUBCUTANEOUS | 1 refills | Status: DC
  Filled 2022-09-07: qty 9, 84d supply, fill #0

## 2022-09-17 ENCOUNTER — Other Ambulatory Visit: Payer: Self-pay

## 2022-09-19 ENCOUNTER — Encounter: Payer: Self-pay | Admitting: *Deleted

## 2022-09-19 NOTE — Progress Notes (Signed)
[facility] Quality Team Note  Name: [name] Date of Birth: 1970/05/25 MRN: [id number] Date: 09/19/2022  [facility] Quality Team has reviewed this patient's chart, please see recommendations below:  [facility] Quality Other; Pt has open gap for A1C.  Last A1C 13.1 on 06/22/22.  Pt has ov 10/26/22.  Would provider be able to order A1C at that visit?  If compliant, we would be able to close gap.

## 2022-09-22 ENCOUNTER — Other Ambulatory Visit: Payer: Self-pay | Admitting: Internal Medicine

## 2022-09-22 DIAGNOSIS — E1142 Type 2 diabetes mellitus with diabetic polyneuropathy: Secondary | ICD-10-CM

## 2022-09-24 NOTE — Telephone Encounter (Signed)
Unable to refill per protocol, Rx expired. Discontinued 04/23/22.  Requested Prescriptions  Pending Prescriptions Disp Refills   B-D ULTRAFINE III SHORT PEN 31G X 8 MM MISC [Pharmacy Med Name: BD UF SHORT PEN NEEDLE 8MMX31G] 100 each 0    Sig: USE AS DIRECTED     Endocrinology: Diabetes - Testing Supplies Passed - 09/22/2022  9:30 AM      Passed - Valid encounter within last 12 months    Recent Outpatient Visits           3 weeks ago Dermatitis   [facility] Center [place], [place]ry L, RPH-CPP   1 month ago Type 2 diabetes mellitus with hyperglycemia, with long-term current use of insulin [facility])   [place] [facility] Center [place], [place] L, RPH-CPP   3 months ago Type 2 diabetes mellitus with hyperglycemia, with long-term current use of insulin [facility])   [place] [facility] Marcine Matar, MD   5 months ago Encounter for [employer] annual wellness exam   [facility] [place], [place] L, RPH-CPP   7 months ago Diabetes mellitus type 2 in nonobese [facility])   [place] [facility] Marcine Matar, MD       Future Appointments             In 1 month Laural Benes, Binnie Rail, MD [facility]

## 2022-10-01 ENCOUNTER — Other Ambulatory Visit: Payer: Self-pay | Admitting: Internal Medicine

## 2022-10-01 ENCOUNTER — Encounter: Payer: Self-pay | Admitting: Internal Medicine

## 2022-10-01 DIAGNOSIS — E119 Type 2 diabetes mellitus without complications: Secondary | ICD-10-CM

## 2022-10-01 MED ORDER — PEN NEEDLES 31G X 8 MM MISC
6 refills | Status: AC
Start: 2022-10-01 — End: ?

## 2022-10-04 ENCOUNTER — Other Ambulatory Visit: Payer: Self-pay

## 2022-10-08 ENCOUNTER — Other Ambulatory Visit: Payer: Self-pay

## 2022-10-12 ENCOUNTER — Other Ambulatory Visit: Payer: Self-pay

## 2022-10-26 ENCOUNTER — Encounter: Payer: Self-pay | Admitting: Internal Medicine

## 2022-10-26 ENCOUNTER — Ambulatory Visit: Payer: 59 | Attending: Internal Medicine | Admitting: Internal Medicine

## 2022-10-26 ENCOUNTER — Other Ambulatory Visit: Payer: Self-pay

## 2022-10-26 VITALS — BP 101/72 | HR 88 | Temp 98.1°F | Wt 149.0 lb

## 2022-10-26 DIAGNOSIS — E1159 Type 2 diabetes mellitus with other circulatory complications: Secondary | ICD-10-CM

## 2022-10-26 DIAGNOSIS — F321 Major depressive disorder, single episode, moderate: Secondary | ICD-10-CM

## 2022-10-26 DIAGNOSIS — Z794 Long term (current) use of insulin: Secondary | ICD-10-CM

## 2022-10-26 DIAGNOSIS — E119 Type 2 diabetes mellitus without complications: Secondary | ICD-10-CM | POA: Diagnosis not present

## 2022-10-26 DIAGNOSIS — I152 Hypertension secondary to endocrine disorders: Secondary | ICD-10-CM

## 2022-10-26 DIAGNOSIS — Z7984 Long term (current) use of oral hypoglycemic drugs: Secondary | ICD-10-CM

## 2022-10-26 DIAGNOSIS — Z7985 Long-term (current) use of injectable non-insulin antidiabetic drugs: Secondary | ICD-10-CM

## 2022-10-26 DIAGNOSIS — G40909 Epilepsy, unspecified, not intractable, without status epilepticus: Secondary | ICD-10-CM

## 2022-10-26 LAB — POCT GLYCOSYLATED HEMOGLOBIN (HGB A1C): HbA1c, POC (controlled diabetic range): 7.3 % — AB (ref 0.0–7.0)

## 2022-10-26 LAB — GLUCOSE, POCT (MANUAL RESULT ENTRY): POC Glucose: 116 mg/dl — AB (ref 70–99)

## 2022-10-26 MED ORDER — TRESIBA FLEXTOUCH 100 UNIT/ML ~~LOC~~ SOPN
16.0000 [IU] | PEN_INJECTOR | Freq: Every day | SUBCUTANEOUS | 1 refills | Status: DC
Start: 2022-10-26 — End: 2023-05-20
  Filled 2022-10-26: qty 15, 93d supply, fill #0

## 2022-10-26 NOTE — Progress Notes (Signed)
Patient ID: [name], male    DOB: 11/05/1970  MRN: [id number]  CC: chronic ds management  Subjective: [name] is a 52 y.o. male who presents for chronic ds management.  Sister, Vonna Kotyk, is with him His concerns today include:  Pt with hx of DM type 2, Sz disorder, PUD (GIB 04/2019), HTN, fatty liver, HL, major cognitive disorder (patient had neuropsychiatric evaluation done by Dr. Clayborn Heron 11/2019).   DM: Last A1C 13.1 BS: 116, A1C today 7.3 Amaryl d/c on last visit.  Started on Tresiba 10 units at bedtime instead.  Continued on Trulicity 1.5 mg Qwk and Metformin 500 BID.   Trulicity changed to Ozempic due to [employer].  Currently on 1 mg qwk.  Wgh down 8 lbs since last visit in March.  Ozempic has decreased appetite. No N/V/D from Ozempic. -pt not checking BS.  He declines CGM.   HTN:  taking and tolerating Lisinopril 2.5 mg daily  MDD/GAD/Cog ds:  taking and tolerating Lexapro 20 mg daily  SZ:  no sz.  On Keprra.  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) 06/22/2022   Moderate major depression (HCC) 10/13/2021   Neurocognitive disorder 03/18/2020   Elevated blood pressure reading 08/24/2019   Memory changes 05/25/2019   Non-traumatic rhabdomyolysis    Acute GI bleeding 04/21/2019   Diabetes mellitus type 2 in nonobese (HCC) 04/21/2019   Seizures (HCC) 04/19/2019   Acute blood loss anemia    Duodenal ulcer hemorrhage    Acute gastric ulcer without hemorrhage or perforation    Acute renal failure (ARF) (HCC) 04/05/2019   Acute metabolic encephalopathy 04/05/2019     Current Outpatient Medications on File Prior to Visit  Medication Sig Dispense Refill   Insulin Pen Needle (PEN NEEDLES) 31G X 8 MM MISC UAD for daily insulin injection.  BD Ultra Fine needles 100 each 6   ACCU-CHEK GUIDE test strip USE AS INSTRUCTED 100 strip 12   Accu-Chek Softclix Lancets  lancets Please check CBG in morning before eating, 2 hours after a meal and a bed time. TID 100 each 0   atorvastatin (LIPITOR) 20 MG tablet TAKE 1 TABLET BY MOUTH EVERY DAY 90 tablet 1   Blood Glucose Monitoring Suppl (ACCU-CHEK GUIDE ME) w/Device KIT USE AS DIRECTED 1 kit 0   escitalopram (LEXAPRO) 20 MG tablet Take 1 tablet (20 mg total) by mouth daily. 90 tablet 3   hydrOXYzine (VISTARIL) 25 MG capsule TAKE 1 CAPSULE BY MOUTH EVERYDAY AT BEDTIME 90 capsule 3   levETIRAcetam (KEPPRA) 500 MG tablet Take 1 tablet twice a day 180 tablet 3   lisinopril (ZESTRIL) 2.5 MG tablet Take 1 tablet (2.5 mg total) by mouth daily. 90 tablet 1   metFORMIN (GLUCOPHAGE) 500 MG tablet TAKE 1 TABLET BY MOUTH 2 TIMES DAILY WITH A MEAL. 180 tablet 3   pantoprazole (PROTONIX) 40 MG tablet TAKE 1 TABLET BY MOUTH TWICE A DAY 180 tablet 3   Semaglutide, 1 MG/DOSE, 4 MG/3ML SOPN Inject 1 mg as directed once a week. 9 mL 1   triamcinolone cream (KENALOG) 0.1 % Apply to affected area BID PRN 80 g 1   No current facility-administered medications on file prior to visit.    Allergies  Allergen Reactions   Penicillins Hives and Other (See Comments)    Childhood allergy  Did it involve swelling of the face/tongue/throat, SOB, or low BP? Unknown  Did it involve sudden or severe rash/hives, skin peeling, or any reaction on the inside of your mouth or nose? Unknown Did you need to seek medical attention at a hospital or doctor's office? Unknown When did it last happen?      Pt was a child If all above answers are "NO", may proceed with cephalosporin use. Sister states he just breaks out in hives unsure about other reactions.     Prozac [Fluoxetine]     Agitated and belligerent towards family members    Social History   Socioeconomic History   Marital status: Single    Spouse name: Not on file   Number of children: Not on file   Years of education: Not on file   Highest education level: Not on file  Occupational  History   Not on file  Tobacco Use   Smoking status: Never   Smokeless tobacco: Never  Vaping Use   Vaping status: Never Used  Substance and Sexual Activity   Alcohol use: Not Currently   Drug use: Not Currently    Comment: 5 times per month (per 04/21/19 hospital note)   Sexual activity: Not on file  Other Topics Concern   Not on file  Social History Narrative   Left handed   Drinks caffeine   Single story home   Social Determinants of Health   Financial Resource Strain: Low Risk  (07/27/2022)   Overall Financial Resource Strain (CARDIA)    Difficulty of Paying Living Expenses: Not very hard  Food Insecurity: No Food Insecurity (07/27/2022)   Hunger Vital Sign    Worried About Running Out of Food in the Last Year: Never true    Ran Out of Food in the Last Year: Never true  Transportation Needs: No Transportation Needs (07/27/2022)   PRAPARE - Administrator, Civil Service (Medical): No    Lack of Transportation (Non-Medical): No  Physical Activity: Inactive (07/27/2022)   Exercise Vital Sign    Days of Exercise per Week: 0 days    Minutes of Exercise per Session: 0 min  Stress: No Stress Concern Present (07/27/2022)   [employer] of Occupational Health - Occupational Stress Questionnaire    Feeling of Stress : Only a little  Social Connections: Socially Isolated (07/27/2022)   Social Connection and Isolation Panel [NHANES]    Frequency of Communication with Friends and Family: Never    Frequency of Social Gatherings with Friends and Family: More than three times a week    Attends Religious Services: Never    Database administrator or Organizations: No    Attends Banker Meetings: Never    Marital Status: Never married  Intimate Partner Violence: Not At Risk (07/27/2022)   Humiliation, Afraid, Rape, and Kick questionnaire    Fear of Current or Ex-Partner: No    Emotionally Abused: No    Physically Abused: No    Sexually Abused: No    Family  History  Problem Relation Age of Onset   Diabetes Mother    Diabetes Sister    Diabetes Father    Esophageal cancer Neg Hx    Stomach cancer Neg Hx    Colon cancer Neg Hx    Pancreatic cancer Neg Hx     Past Surgical History:  Procedure Laterality Date   BIOPSY  04/17/2019   Procedure: BIOPSY;  Surgeon: Hilarie Fredrickson, MD;  Location: [facility] ENDOSCOPY;  Service: Endoscopy;;   ESOPHAGOGASTRODUODENOSCOPY Left 04/21/2019   Procedure: ESOPHAGOGASTRODUODENOSCOPY (  EGD);  Surgeon: Beverley Fiedler, MD;  Location: [facility] ENDOSCOPY;  Service: Gastroenterology;  Laterality: Left;   ESOPHAGOGASTRODUODENOSCOPY (EGD) WITH PROPOFOL N/A 04/17/2019   Procedure: ESOPHAGOGASTRODUODENOSCOPY (EGD) WITH PROPOFOL;  Surgeon: Hilarie Fredrickson, MD;  Location: [facility] ENDOSCOPY;  Service: Endoscopy;  Laterality: N/A;   ESOPHAGOGASTRODUODENOSCOPY (EGD) WITH PROPOFOL N/A 04/23/2019   Procedure: ESOPHAGOGASTRODUODENOSCOPY (EGD) WITH PROPOFOL;  Surgeon: Beverley Fiedler, MD;  Location[facility] ENDOSCOPY;  Service: Gastroenterology;  Laterality: N/A;   HEMOSTASIS CLIP PLACEMENT  04/17/2019   Procedure: HEMOSTASIS CLIP PLACEMENT;  Surgeon: Hilarie Fredrickson, MD;  Location: [facility] ENDOSCOPY;  Service: Endoscopy;;   HEMOSTASIS CLIP PLACEMENT  04/21/2019   Procedure: HEMOSTASIS CLIP PLACEMENT;  Surgeon: Beverley Fiedler, MD;  Location: [facility] ENDOSCOPY;  Service: Gastroenterology;;   IR FLUORO GUIDE CV LINE RIGHT  04/10/2019   IR REMOVAL TUN CV CATH W/O FL  04/17/2019   IR US GUIDE VASC ACCESS RIGHT  04/10/2019   SUBMUCOSAL INJECTION  04/17/2019   Procedure: SUBMUCOSAL INJECTION;  Surgeon: Hilarie Fredrickson, MD;  Location: [facility] ENDOSCOPY;  Service: Endoscopy;;    ROS: Review of Systems Negative except as stated above  PHYSICAL EXAM: BP 101/72   Pulse 88   Temp 98.1 F (36.7 C) (Oral)   Wt 149 lb (67.6 kg)   SpO2 98%   BMI 21.38 kg/m   Wt Readings from Last 3 Encounters:  10/26/22 149 lb (67.6 kg)  08/31/22 148 lb 3.2 oz (67.2 kg)  06/22/22 157 lb (71.2 kg)     Physical Exam   General appearance - alert, well appearing, and in no distress Mental status - flat affect.  Answers questions appropriately Chest - clear to auscultation, no wheezes, rales or rhonchi, symmetric air entry Heart - normal rate, regular rhythm, normal S1, S2, no murmurs, rubs, clicks or gallops Extremities - peripheral pulses normal, no pedal edema, no clubbing or cyanosis    07/27/2022    1:06 PM 06/22/2022    8:42 AM 04/23/2022    1:53 PM  Depression screen PHQ 2/9  Decreased Interest 2 2 0  Down, Depressed, Hopeless 1 1 0  PHQ - 2 Score 3 3 0  Altered sleeping 2 2   Tired, decreased energy 1 1   Change in appetite 1 1   Feeling bad or failure about yourself  0 0   Trouble concentrating 0 0   Moving slowly or fidgety/restless 0 0   Suicidal thoughts 0 0   PHQ-9 Score 7 7   Difficult doing work/chores Somewhat difficult         Latest Ref Rng & Units 06/22/2022    9:51 AM 01/26/2022    3:27 PM 10/13/2021   12:18 PM  CMP  Glucose 70 - 99 mg/dL 161  096  045   BUN 6 - 24 mg/dL 13  16  14    Creatinine 0.76 - 1.27 mg/dL 4.09  8.11  9.14   Sodium 134 - 144 mmol/L 133  139  133   Potassium 3.5 - 5.2 mmol/L 4.1  4.3  4.3   Chloride 96 - 106 mmol/L 95  99  95   CO2 20 - 29 mmol/L 22  23  23    Calcium 8.7 - 10.2 mg/dL 9.7  9.7  9.7   Total Protein 6.0 - 8.5 g/dL 7.0  7.1    Total Bilirubin 0.0 - 1.2 mg/dL 0.5  0.5    Alkaline Phos 44 - 121 IU/L 110  85  AST 0 - 40 IU/L 21  25    ALT 0 - 44 IU/L 27  32     Lipid Panel     Component Value Date/Time   CHOL 130 01/26/2022 1527   TRIG 128 01/26/2022 1527   HDL 23 (L) 01/26/2022 1527   CHOLHDL 5.7 (H) 01/26/2022 1527   LDLCALC 84 01/26/2022 1527    CBC    Component Value Date/Time   WBC 7.4 01/26/2022 1527   WBC 7.3 10/10/2019 0027   RBC 5.39 01/26/2022 1527   RBC 4.98 10/10/2019 0027   HGB 13.9 01/26/2022 1527   HCT 42.0 01/26/2022 1527   PLT 246 01/26/2022 1527   MCV 78 (L) 01/26/2022 1527    MCH 25.8 (L) 01/26/2022 1527   MCH 25.3 (L) 10/10/2019 0027   MCHC 33.1 01/26/2022 1527   MCHC 32.2 10/10/2019 0027   RDW 13.6 01/26/2022 1527   LYMPHSABS 2.1 10/09/2019 1304   MONOABS 0.7 10/09/2019 1304   EOSABS 0.9 (H) 10/09/2019 1304   BASOSABS 0.1 10/09/2019 1304    ASSESSMENT AND PLAN:  1. Diabetes mellitus type 2 in nonobese [facility]) Much improved and close to goal. Continue current dose of Ozempic 1 mg, Tresiba 10 units daily and Metformin 500 mg BID - TRESIBA FLEXTOUCH 100 UNIT/ML FlexTouch Pen; Inject 16 Units into the skin daily.  Dispense: 15 mL; Refill: 1  2. Insulin long-term use (HCC) See above  3. Diabetes mellitus treated with oral medication (HCC) See above  4. Long-term (current) use of injectable non-insulin antidiabetic drugs See above  5. Hypertension associated with type 2 diabetes mellitus (HCC) At goal. Continue Lisinopril  6. Moderate major depression (HCC) Continue Lexapro  7. Seizure disorder (HCC) Stable on Keppra. Continue med.    Patient was given the opportunity to ask questions.  Patient verbalized understanding of the plan and was able to repeat key elements of the plan.   This documentation was completed using Paediatric nurse.  Any transcriptional errors are unintentional.  Orders Placed This Encounter  Procedures   POCT glycosylated hemoglobin (Hb A1C)   POCT glucose (manual entry)     Requested Prescriptions   Signed Prescriptions Disp Refills   TRESIBA FLEXTOUCH 100 UNIT/ML FlexTouch Pen 15 mL 1    Sig: Inject 16 Units into the skin daily.    Return in about 4 months (around 02/26/2023).  Jonah Blue, MD, FACP

## 2022-10-30 ENCOUNTER — Other Ambulatory Visit: Payer: Self-pay

## 2022-11-27 ENCOUNTER — Other Ambulatory Visit (HOSPITAL_COMMUNITY): Payer: Self-pay

## 2022-11-30 ENCOUNTER — Other Ambulatory Visit (HOSPITAL_COMMUNITY): Payer: Self-pay

## 2022-12-28 ENCOUNTER — Ambulatory Visit (INDEPENDENT_AMBULATORY_CARE_PROVIDER_SITE_OTHER): Payer: 59 | Admitting: Neurology

## 2022-12-28 ENCOUNTER — Encounter: Payer: Self-pay | Admitting: Neurology

## 2022-12-28 VITALS — BP 100/66 | HR 100 | Ht 70.0 in | Wt 148.2 lb

## 2022-12-28 DIAGNOSIS — F0280 Dementia in other diseases classified elsewhere without behavioral disturbance: Secondary | ICD-10-CM | POA: Diagnosis not present

## 2022-12-28 DIAGNOSIS — R400 Somnolence: Secondary | ICD-10-CM

## 2022-12-28 DIAGNOSIS — F32A Depression, unspecified: Secondary | ICD-10-CM

## 2022-12-28 DIAGNOSIS — F419 Anxiety disorder, unspecified: Secondary | ICD-10-CM | POA: Diagnosis not present

## 2022-12-28 DIAGNOSIS — G40909 Epilepsy, unspecified, not intractable, without status epilepticus: Secondary | ICD-10-CM

## 2022-12-28 MED ORDER — LEVETIRACETAM 500 MG PO TABS
ORAL_TABLET | ORAL | 3 refills | Status: DC
Start: 2022-12-28 — End: 2023-08-30

## 2022-12-28 MED ORDER — ESCITALOPRAM OXALATE 20 MG PO TABS: 20.0000 mg | ORAL_TABLET | Freq: Every day | ORAL | 3 refills | Status: DC

## 2022-12-28 NOTE — Patient Instructions (Addendum)
Good to see you.  Schedule home sleep study  2. We will send a referral to a different Psychiatrist and therapist, let us know if any issues  Psych [address] [place] [address] [place] [address]  [phone]  3. Continue all your medications  4. Follow-up in 6-8 months, call for any changes   Seizure Precautions: 1. If medication has been prescribed for you to prevent seizures, take it exactly as directed.  Do not stop taking the medicine without talking to your doctor first, even if you have not had a seizure in a long time.   2. Avoid activities in which a seizure would cause danger to yourself or to others.  Don't operate dangerous machinery, swim alone, or climb in high or dangerous places, such as on ladders, roofs, or girders.  Do not drive unless your doctor says you may.  3. If you have any warning that you may have a seizure, lay down in a safe place where you can't hurt yourself.    4.  No driving for 6 months from last seizure, as per [facility].   Please refer to the following link on the [employer]ile[employer]'s website for more information: http://www.epilepsyfoundation.org/answerplace/Social/driving/drivingu.cfm   5.  Maintain good sleep hygiene.  6.  Contact your doctor if you have any problems that may be related to the medicine you are taking.  7.  Call 911 and bring the patient back to the ED if:        A.  The seizure lasts longer than 5 minutes.       B.  The patient doesn't awaken shortly after the seizure  C.  The patient has new problems such as difficulty seeing, speaking or moving  D.  The patient was injured during the seizure  E.  The patient has a temperature over 102 F (39C)  F.  The patient vomited and now is having trouble breathing

## 2022-12-28 NOTE — Progress Notes (Signed)
NEUROLOGY FOLLOW UP OFFICE NOTE  Jacyon Marban [id number] December 28, 1970  HISTORY OF PRESENT ILLNESS: I had the pleasure of seeing [name] in follow-up in the neurology clinic on 12/28/2022.  The patient was last seen 8 months ago for Neurocognitive disorder likely due to multiple etiologies. He is again accompanied by his sister Dorene Grebe who helps supplement the history today. His other sister Vonna Kotyk is on speakerphone. Records and images were personally reviewed where available.  Since his last visit, he reports symptoms are "same, same." He continues to have flat affect with poor eye contact. They deny any seizures since 2020 on Levetiracetam 500mg  BID. No staring/unresponsive episodes. He denie any headaches, significant dizziness, vision changes, no falls. He keeps to himself most of the time and stays in his room. He states mood is "I don't know, I sleep all the time." He sleeps all night but sleep a lot in the day. He is on his laptop when not napping. He does not get exercise. He does not drive. He has not seen Behavioral Health yet. He still has a lot of itching. He is on Lexapro 20mg  daily. No side effects on medications.    History on Initial Assessment 06/16/2019: This is a 52 year old right-handed man with a history of hyperlipidemia, diabetes, presenting for evaluation of seizure and cognitive changes. His sister Vonna Kotyk is present during the visit to provide additional information due to continued cognitive changes. He was in his usual state of health until the week of Christmas 2020 when he had a toothache. Vonna Kotyk reports he has very bad teeth, he took Tylenol, then told her he was not feeling good on 12/7. He was talking fine, recognizing his sister, he was confused and out of it, so they brought him to the ER. Vonna Kotyk reported marijuana use, no alcohol or other illicit drugs. His BP was 156/104, HR 112. He was found to be in renal failure with creatinine of 10.38, AST 22, ALT 156. UDS positive for THC. MRI  brain without contrast done 04/07/19 showed abnormal signal in the medial temporal lobe/hippocampus and posteromedial left thalamus, mild associated parenchymal swelling, chronic blood products along the right parietal lobe. He had an 18-hour EEG showing continuous rhythmic 2-3 Hz delta activity in the right temporoparietal region, at times sharply contoured. There was also slowing on the left hemisphere, and intermittent rhythmic generalized delta slowing. During his hospital stay, he had a generalized tonic-clonic seizure on 04/10/19, then another with note of left gaze deviation, left upper extremity extended, unresponsive. EEG showed diffuse slowing and triphasic waves. Repeat EEG showed generalized slowing, triphasic waves. He had a lumbar puncture with WBC 3, protein 53, glucose 62. Negative HSV, VZV, ACE. Negative anti-Jo, dsDNA, SS-A, SS-B, SCL 70, anti-SM Ab. Repeat MRI brain done 04/15/19 showed new changes with fairly extensive subcortical greater than cortical signal abnormality in the bilateral frontoparietal lboes, occipital lobes, and posterior temporal lobes. Patchy cortical restricted diffuse suspicious for acute/early subacute infarcts. There was also new signal change in the bilateral cerebellum and patchy restricted diffusion suspicious for acute/early subacute infarcts. 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 He received 5 days of IV Solu-Medrol. It was noted that his BP on admission was 156/104, as high as 219/98 on 1/4. EEG on 12/29 showed diffuse delta slowing in the right temporoparietal region, slowing on the left hemisphere.   He has a prior history of seizures, his sister reports a few seizures while he was teething.  In 2006/2007 he had a couple of seizure where his eyes rolled back with stiffening and shaking. He was evaluated by Dr. Sharene Skeans in 2007. At that time, he woke up dizzy with rotatory nystagmus, headache, followed by left focal  motor seizure. LP at that time RBCs 11,320, likely a traumatic tap, however due to sudden onset headache and possible seizure, catheter angiogram was done, which was normal. EEG in 2007 reported occasional sharp transients in the left frontotemporal region. MRI brain no acute changes.   His sister reports that "his memory just went." He did not recognize her when she visited him in the hospital. Physically he is getting better, his sister makes sure he eats regularly. Cognitively he has not improved, they need to repeat and reiterate themselves to him. No staring/unresponsiveness, but he has delayed responses, like he has to overprocess information. He is able to bathe and dress himself, but she has to remind him to shower because he thinks he just had one. Most of the time he is calm, he occasionally gets very agitated for no reason, then calms down. No paranoia or hallucinations. When he initially came home, he did not remember he had a dog and kept calling him "dog," so afraid of him. It took several weeks to be comfortable around him. She reports that he was not taking any medications prior to hospital admission. He used to smoke a lot of weed and "swears up and down that he had not used in years," but Vonna Kotyk says he used it daily. No alcohol use. A few days after hospital discharge, his family found him on the floor, he was hypotensive and had soiled his clothes, then was standing in a pool of blood. He had acute GI bleed secondary to PUD.   He feels his memory is "good, bad sometimes." He has some dizziness, drowsiness, and fatigue. Balance is bad in the mornings. He has some tingling in his legs. He has occasional headaches in the back of his head with throbbing, sensitivity to lights/sounds. No nausea/vomiting. Vision is sometimes blurred. He has occasional neck pain. No bowel/bladder dysfunction. His sister reports his legs are twitching constantly, decreasing when he lifts his legs up. Vonna Kotyk has not  witnessed any seizures, he is taking Levetiracetam 500mg  BID.   Diagnostic Data: Initial MRI brain in 03/2019 showed changes in the left medial temporal lobe/hippocampus and left thalamus.   During his hospital stay, he had a GTC with left gaze deviation. Repeat MRI brain a few days later showed extensive signal abnormality in the subcortical bilateral frontoparietal lobes, occipital lobes, and posterior temporal lobes, with restricted diffusion suspicious for acute/early subacute infarcts. 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   Repeat brain MRI with and without contrast in May 2022 no acute changes, there were small remote cortical infarcts scattered in the bilateral frontoparietal regions, chronic microvascular disease.  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  1-hour wake and sleep EEG in 09/2019 normal  Ambulatory 45-hour EEG in January 2022 normal, typical events not captured.  [facility] negative 03/2020  EMG/NCV of left arm in April 2022 showed very mild left median neuropathy and mild chronic C5-6 radiculopathy   PAST MEDICAL HISTORY: Past Medical  History:  Diagnosis Date   Cataract cortical, senile, bilateral    Diabetes mellitus, type 2 (HCC)    Duodenal ulcer    Fatty liver    Ocular hypertension, bilateral    Peptic ulcer disease    Psoriasis    Seizures (HCC)     MEDICATIONS: Current Outpatient Medications on File Prior to Visit  Medication Sig Dispense Refill   ACCU-CHEK GUIDE test strip USE AS INSTRUCTED 100 strip 12   Accu-Chek Softclix Lancets lancets Please check CBG in morning before eating, 2 hours  after a meal and a bed time. TID 100 each 0   atorvastatin (LIPITOR) 20 MG tablet TAKE 1 TABLET BY MOUTH EVERY DAY 90 tablet 1   Blood Glucose Monitoring Suppl (ACCU-CHEK GUIDE ME) w/Device KIT USE AS DIRECTED 1 kit 0   escitalopram (LEXAPRO) 20 MG tablet Take 1 tablet (20 mg total) by mouth daily. 90 tablet 3   hydrOXYzine (VISTARIL) 25 MG capsule TAKE 1 CAPSULE BY MOUTH EVERYDAY AT BEDTIME 90 capsule 3   Insulin Pen Needle (PEN NEEDLES) 31G X 8 MM MISC UAD for daily insulin injection.  BD Ultra Fine needles 100 each 6   levETIRAcetam (KEPPRA) 500 MG tablet Take 1 tablet twice a day 180 tablet 3   lisinopril (ZESTRIL) 2.5 MG tablet Take 1 tablet (2.5 mg total) by mouth daily. 90 tablet 1   metFORMIN (GLUCOPHAGE) 500 MG tablet TAKE 1 TABLET BY MOUTH 2 TIMES DAILY WITH A MEAL. 180 tablet 3   pantoprazole (PROTONIX) 40 MG tablet TAKE 1 TABLET BY MOUTH TWICE A DAY 180 tablet 3   Semaglutide, 1 MG/DOSE, 4 MG/3ML SOPN Inject 1 mg as directed once a week. 9 mL 1   TRESIBA FLEXTOUCH 100 UNIT/ML FlexTouch Pen Inject 16 Units into the skin daily. 15 mL 1   triamcinolone cream (KENALOG) 0.1 % Apply to affected area BID PRN 80 g 1   No current facility-administered medications on file prior to visit.    ALLERGIES: Allergies  Allergen Reactions   Penicillins Hives and Other (See Comments)    Childhood allergy  Did it involve swelling of the face/tongue/throat, SOB, or low BP? Unknown Did it involve sudden or severe rash/hives, skin peeling, or any reaction on the inside of your mouth or nose? Unknown Did you need to seek medical attention at a hospital or doctor's office? Unknown When did it last happen?      Pt was a child If all above answers are "NO", may proceed with cephalosporin use. Sister states he just breaks out in hives unsure about other reactions.     Prozac [Fluoxetine]     Agitated and belligerent towards family members    FAMILY HISTORY: Family History  Problem Relation Age of  Onset   Diabetes Mother    Diabetes Sister    Diabetes Father    Esophageal cancer Neg Hx    Stomach cancer Neg Hx    Colon cancer Neg Hx    Pancreatic cancer Neg Hx     SOCIAL HISTORY: Social History   Socioeconomic History   Marital status: Single    Spouse name: Not on file   Number of children: Not on file   Years of education: Not on file   Highest education level: Not on file  Occupational History   Not on file  Tobacco Use   Smoking status: Never   Smokeless tobacco: Never  Vaping Use   Vaping status: Never Used  Substance and  Sexual Activity   Alcohol use: Not Currently   Drug use: Not Currently    Comment: 5 times per month (per 04/21/19 hospital note)   Sexual activity: Not on file  Other Topics Concern   Not on file  Social History Narrative   Left handed   Drinks caffeine   Single story home   Social Determinants of Health   Financial Resource Strain: Low Risk  (07/27/2022)   Overall Financial Resource Strain (CARDIA)    Difficulty of Paying Living Expenses: Not very hard  Food Insecurity: No Food Insecurity (07/27/2022)   Hunger Vital Sign    Worried About Running Out of Food in the Last Year: Never true    Ran Out of Food in the Last Year: Never true  Transportation Needs: No Transportation Needs (07/27/2022)   PRAPARE - Administrator, Civil Service (Medical): No    Lack of Transportation (Non-Medical): No  Physical Activity: Inactive (07/27/2022)   Exercise Vital Sign    Days of Exercise per Week: 0 days    Minutes of Exercise per Session: 0 min  Stress: No Stress Concern Present (07/27/2022)   [employer] of Occupational Health - Occupational Stress Questionnaire    Feeling of Stress : Only a little  Social Connections: Socially Isolated (07/27/2022)   Social Connection and Isolation Panel [NHANES]    Frequency of Communication with Friends and Family: Never    Frequency of Social Gatherings with Friends and Family: More than  three times a week    Attends Religious Services: Never    Database administrator or Organizations: No    Attends Banker Meetings: Never    Marital Status: Never married  Intimate Partner Violence: Not At Risk (07/27/2022)   Humiliation, Afraid, Rape, and Kick questionnaire    Fear of Current or Ex-Partner: No    Emotionally Abused: No    Physically Abused: No    Sexually Abused: No     PHYSICAL EXAM: Vitals:   12/28/22 1331  BP: 100/66  Pulse: 100  SpO2: 98%   General: No acute distress, flat affect Head:  Normocephalic/atraumatic Skin/Extremities: No rash, no edema Neurological Exam: alert and oriented to person, place.Year is 2004. No aphasia or dysarthria. Fund of knowledge is reduced. Recent and remote memory are impaired. Attention and concentration are normal.  MMSE 22/30    12/28/2022    1:00 PM 04/23/2022    1:56 PM 10/02/2021   12:00 PM  MMSE - Mini Mental State Exam  Orientation to time 0 2 1  Orientation to Place 5 5 5   Registration 3 3 3   Attention/ Calculation 5 5 3   Recall 0 2 1  Language- name 2 objects 2 2 2   Language- repeat 1 1 1   Language- follow 3 step command 3 3 3   Language- read & follow direction 1 0 1  Write a sentence 1 0 1  Copy design 1 0 1  Total score 22 23 22    Cranial nerves: Pupils equal, round. Extraocular movements intact with no nystagmus. Visual fields full.  No facial asymmetry.  Motor: Bulk and tone normal, muscle strength 5/5 throughout with no pronator drift.   Finger to nose testing intact.  Gait narrow-based and steady, able to tandem walk adequately.  Romberg negative.   IMPRESSION: This is a 52 yo RH man with a history of hyperlipidemia, diabetes, remote seizures, in his usual state of health until the end of December 2020. He was  found to be in renal failure, initial MRI brain showed changes in the left medial temporal lobe/hippocampus and left thalamus. During his hospital stay, he had a GTC with left gaze  deviation. Repeat MRI brain a few days later showed extensive signal abnormality in the subcortical bilateral frontoparietal lobes, occipital lobes, and posterior temporal lobes, with restricted diffusion suspicious for acute/early subacute infarcts. 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 Follow-up MRI brain June 2021 and May 2022 no acute changes with scattered small areas of cortical encephalomalacia due to suspected PRES with a few areas progressing to infarction. 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 Autoimmune panel negative. Prolonged EEG normal.   He has been seizure-free since 2020 on Levetiracetam 500mg  BID. He continues to have flat affect, poor eye contact, and reports sleeping "all the time." MMSE today 22/30. Suspect symptoms are due to continued depression, continue Lexapro 20mg  daily and hopefully he can establish care with Psychiatry. We discussed the daytime drowsiness, home sleep study will be ordered to assess for OSA. He does not drive. Encouraged to increase physical activity during the day. Follow-up in 6-8 months, call for any changes.    Thank you for allowing me to participate in his care.  Please do not hesitate to call for any questions or concerns.    Patrcia Dolly, M.D.   CC: Dr. Laural Benes

## 2023-01-25 ENCOUNTER — Encounter (HOSPITAL_BASED_OUTPATIENT_CLINIC_OR_DEPARTMENT_OTHER): Payer: 59 | Admitting: Internal Medicine

## 2023-02-28 ENCOUNTER — Telehealth: Payer: Self-pay | Admitting: Internal Medicine

## 2023-02-28 NOTE — Telephone Encounter (Signed)
Pt's sister, Vonna Kotyk called in to cancel appt for tomorrow. Sister states patient is not feeling well and going through depression. Pt goes through spells and has been in one for about 2 weeks. Sister declined to speak to nurse triage, stated pt is getting treatment for his depression.

## 2023-03-01 ENCOUNTER — Ambulatory Visit: Payer: 59 | Admitting: Internal Medicine

## 2023-03-23 ENCOUNTER — Other Ambulatory Visit: Payer: Self-pay | Admitting: Internal Medicine

## 2023-03-23 DIAGNOSIS — E785 Hyperlipidemia, unspecified: Secondary | ICD-10-CM

## 2023-04-11 ENCOUNTER — Telehealth: Payer: Self-pay

## 2023-04-11 NOTE — Telephone Encounter (Signed)
 Pharmacy Patient Advocate Encounter   Received notification from CoverMyMeds that prior authorization for OZEMPIC  is required/requested.   Insurance verification completed.   The patient is insured through [facility] .   Per test claim: PA required; PA submitted to above mentioned insurance via CoverMyMeds Key/confirmation #/EOC [id number] Status is pending

## 2023-04-12 ENCOUNTER — Other Ambulatory Visit: Payer: Self-pay

## 2023-04-12 NOTE — Telephone Encounter (Signed)
 Pharmacy Patient Advocate Encounter  Received notification from [facility] MEDICARE that Prior Authorization for [facility] has been APPROVED from 04/11/2023 to 04/08/2024   PA #/Case ID/Reference #: [id number]

## 2023-04-24 ENCOUNTER — Other Ambulatory Visit: Payer: Self-pay

## 2023-05-19 ENCOUNTER — Other Ambulatory Visit: Payer: Self-pay | Admitting: Internal Medicine

## 2023-05-19 DIAGNOSIS — E119 Type 2 diabetes mellitus without complications: Secondary | ICD-10-CM

## 2023-05-20 NOTE — Telephone Encounter (Signed)
 Requested Prescriptions  Pending Prescriptions Disp Refills   insulin  degludec (TRESIBA  FLEXTOUCH) 100 UNIT/ML FlexTouch Pen [Pharmacy Med Name: TRESIBA  FLEXTOUCH 100 UNIT/ML] 6 mL 0    Sig: INJECT 16 UNITS INTO THE SKIN DAILY.     Endocrinology:  Diabetes - Insulins Failed - 05/20/2023  2:59 PM      Failed - HBA1C is between 0 and 7.9 and within 180 days    HbA1c, POC (controlled diabetic range)  Date Value Ref Range Status  10/26/2022 7.3 (A) 0.0 - 7.0 % Final         Failed - Valid encounter within last 6 months    Recent Outpatient Visits           6 months ago Diabetes mellitus type 2 in nonobese [facility])   [facility] - A Dept Of [place]. [facility] Lawrance Presume, MD   8 months ago Dermatitis   [facility] [place] - A Dept Of [place]. [facility] Freada Jacobs, [place] L, RPH-CPP   9 months ago Type 2 diabetes mellitus with hyperglycemia, with long-term current use of insulin  [facility])   [facility] - A Dept Of [place]. [facility] Freada Jacobs, [place] L, RPH-CPP   11 months ago Type 2 diabetes mellitus with hyperglycemia, with long-term current use of insulin  [facility])   [place] [facility] - A Dept Of [place]. [facility] Lawrance Presume, MD   1 year ago Encounter for [employer] annual wellness exam   [facility] [place] - A Dept Of [place]. [facility] Valente Gaskin, RPH-CPP

## 2023-05-26 ENCOUNTER — Other Ambulatory Visit: Payer: Self-pay | Admitting: Internal Medicine

## 2023-05-26 DIAGNOSIS — E1165 Type 2 diabetes mellitus with hyperglycemia: Secondary | ICD-10-CM

## 2023-06-02 ENCOUNTER — Other Ambulatory Visit: Payer: Self-pay | Admitting: Internal Medicine

## 2023-06-02 DIAGNOSIS — K279 Peptic ulcer, site unspecified, unspecified as acute or chronic, without hemorrhage or perforation: Secondary | ICD-10-CM

## 2023-06-22 ENCOUNTER — Other Ambulatory Visit: Payer: Self-pay | Admitting: Internal Medicine

## 2023-06-22 DIAGNOSIS — E785 Hyperlipidemia, unspecified: Secondary | ICD-10-CM

## 2023-06-27 DIAGNOSIS — E119 Type 2 diabetes mellitus without complications: Secondary | ICD-10-CM | POA: Diagnosis not present

## 2023-06-27 DIAGNOSIS — H2521 Age-related cataract, morgagnian type, right eye: Secondary | ICD-10-CM | POA: Diagnosis not present

## 2023-06-27 DIAGNOSIS — H40013 Open angle with borderline findings, low risk, bilateral: Secondary | ICD-10-CM | POA: Diagnosis not present

## 2023-06-27 DIAGNOSIS — H25812 Combined forms of age-related cataract, left eye: Secondary | ICD-10-CM | POA: Diagnosis not present

## 2023-07-05 DIAGNOSIS — H2589 Other age-related cataract: Secondary | ICD-10-CM | POA: Diagnosis not present

## 2023-08-14 DIAGNOSIS — H2512 Age-related nuclear cataract, left eye: Secondary | ICD-10-CM | POA: Diagnosis not present

## 2023-08-30 ENCOUNTER — Encounter: Payer: Self-pay | Admitting: Neurology

## 2023-08-30 ENCOUNTER — Ambulatory Visit (INDEPENDENT_AMBULATORY_CARE_PROVIDER_SITE_OTHER): Payer: 59 | Admitting: Neurology

## 2023-08-30 VITALS — BP 85/61 | HR 67 | Ht 70.0 in | Wt 141.4 lb

## 2023-08-30 DIAGNOSIS — F32A Depression, unspecified: Secondary | ICD-10-CM | POA: Diagnosis not present

## 2023-08-30 DIAGNOSIS — F0280 Dementia in other diseases classified elsewhere without behavioral disturbance: Secondary | ICD-10-CM | POA: Diagnosis not present

## 2023-08-30 DIAGNOSIS — G40909 Epilepsy, unspecified, not intractable, without status epilepticus: Secondary | ICD-10-CM | POA: Diagnosis not present

## 2023-08-30 DIAGNOSIS — F419 Anxiety disorder, unspecified: Secondary | ICD-10-CM | POA: Diagnosis not present

## 2023-08-30 MED ORDER — ESCITALOPRAM OXALATE 20 MG PO TABS: 20.0000 mg | ORAL_TABLET | Freq: Every day | ORAL | 4 refills | Status: AC

## 2023-08-30 MED ORDER — LEVETIRACETAM 500 MG PO TABS
ORAL_TABLET | ORAL | 4 refills | Status: AC
Start: 2023-08-30 — End: ?

## 2023-08-30 NOTE — Progress Notes (Signed)
 NEUROLOGY FOLLOW UP OFFICE NOTE  [name] [id number] [birth year]  HISTORY OF PRESENT ILLNESS: I had the pleasure of seeing [name] in follow-up in the neurology clinic on 08/30/2023.  The patient was last seen 8 months ago for Neurocognitive disorder likely due to multiple etiologies, history of seizure. He is again accompanied by his sister Ladena Picking, his other sister Marijean Shouts is on speakerphone to provide additional information. Records and images were personally reviewed where available. On his last visit, we discussed doing a home sleep study due to report of daytime drowsiness. We also discussed Psychiatry referral for depression. He has not done either, he does not want to do the sleep study and cancelled the appointment. He was not ready to go see Psychiatry so they did not schedule it. He states he watches TV at night and sleeps during the day. Ladena Picking feels it is more due to boredom. He does not do any regular exercise or go out of the house. He reports soreness in his knees and left elbow. No falls. He denies any headaches, dizziness, vision changes, no falls. His BP today is 85/61, he has not taken his BP medication yet. They have a BP monitor at home. Marijean Shouts administers his medications. No seizures since 2020 on Levetiracetam  500mg  BID. He is also on Lexapro  20mg  daily. No side effects on medications.   History on Initial Assessment 06/16/2019: This is a 53 year old right-handed man with a history of hyperlipidemia, diabetes, presenting for evaluation of seizure and cognitive changes. His sister Marijean Shouts is present during the visit to provide additional information due to continued cognitive changes. He was in his usual state of health until the week of Christmas 2020 when he had a toothache. Marijean Shouts reports he has very bad teeth, he took Tylenol , then told her he was not feeling good on 12/7. He was talking fine, recognizing his sister, he was confused and out of it, so they brought him to the ER. Marijean Shouts reported  marijuana use, no alcohol or other illicit drugs. His BP was 156/104, HR 112. He was found to be in renal failure with creatinine of 10.38, AST 22, ALT 156. UDS positive for THC. MRI brain without contrast done 04/07/19 showed abnormal signal in the medial temporal lobe/hippocampus and posteromedial left thalamus, mild associated parenchymal swelling, chronic blood products along the right parietal lobe. He had an 18-hour EEG showing continuous rhythmic 2-3 Hz delta activity in the right temporoparietal region, at times sharply contoured. There was also slowing on the left hemisphere, and intermittent rhythmic generalized delta slowing. During his hospital stay, he had a generalized tonic-clonic seizure on 04/10/19, then another with note of left gaze deviation, left upper extremity extended, unresponsive. EEG showed diffuse slowing and triphasic waves. Repeat EEG showed generalized slowing, triphasic waves. He had a lumbar puncture with WBC 3, protein 53, glucose 62. Negative HSV, VZV, ACE. Negative anti-Jo, dsDNA, SS-A, SS-B, SCL 70, anti-SM Ab. Repeat MRI brain done 04/15/19 showed new changes with fairly extensive subcortical greater than cortical signal abnormality in the bilateral frontoparietal lboes, occipital lobes, and posterior temporal lobes. Patchy cortical restricted diffuse suspicious for acute/early subacute infarcts. There was also new signal change in the bilateral cerebellum and patchy restricted diffusion suspicious for acute/early subacute infarcts. 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 He received 5 days of IV Solu-Medrol . It was noted that his BP on admission was 156/104, as high as 219/98 on 1/4. EEG on 12/29 showed diffuse  delta slowing in the right temporoparietal region, slowing on the left hemisphere.   He has a prior history of seizures, his sister reports a few seizures while he was teething. In 2006/2007 he had a couple of seizure  where his eyes rolled back with stiffening and shaking. He was evaluated by Dr. Darlys Eland in 2007. At that time, he woke up dizzy with rotatory nystagmus, headache, followed by left focal motor seizure. LP at that time RBCs 11,320, likely a traumatic tap, however due to sudden onset headache and possible seizure, catheter angiogram was done, which was normal. EEG in 2007 reported occasional sharp transients in the left frontotemporal region. MRI brain no acute changes.   His sister reports that "his memory just went." He did not recognize her when she visited him in the hospital. Physically he is getting better, his sister makes sure he eats regularly. Cognitively he has not improved, they need to repeat and reiterate themselves to him. No staring/unresponsiveness, but he has delayed responses, like he has to overprocess information. He is able to bathe and dress himself, but she has to remind him to shower because he thinks he just had one. Most of the time he is calm, he occasionally gets very agitated for no reason, then calms down. No paranoia or hallucinations. When he initially came home, he did not remember he had a dog and kept calling him "dog," so afraid of him. It took several weeks to be comfortable around him. She reports that he was not taking any medications prior to hospital admission. He used to smoke a lot of weed and "swears up and down that he had not used in years," but Marijean Shouts says he used it daily. No alcohol use. A few days after hospital discharge, his family found him on the floor, he was hypotensive and had soiled his clothes, then was standing in a pool of blood. He had acute GI bleed secondary to PUD.   He feels his memory is "good, bad sometimes." He has some dizziness, drowsiness, and fatigue. Balance is bad in the mornings. He has some tingling in his legs. He has occasional headaches in the back of his head with throbbing, sensitivity to lights/sounds. No nausea/vomiting. Vision is  sometimes blurred. He has occasional neck pain. No bowel/bladder dysfunction. His sister reports his legs are twitching constantly, decreasing when he lifts his legs up. Marijean Shouts has not witnessed any seizures, he is taking Levetiracetam  500mg  BID.   Diagnostic Data: Initial MRI brain in 03/2019 showed changes in the left medial temporal lobe/hippocampus and left thalamus.   During his hospital stay, he had a GTC with left gaze deviation. Repeat MRI brain a few days later showed extensive signal abnormality in the subcortical bilateral frontoparietal lobes, occipital lobes, and posterior temporal lobes, with restricted diffusion suspicious for acute/early subacute infarcts. 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   Repeat brain MRI with and without contrast in May 2022 no acute changes, there were small remote cortical infarcts scattered in the bilateral frontoparietal regions, chronic microvascular disease.  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  1-hour wake and sleep EEG in 09/2019 normal  Ambulatory 45-hour EEG in January 2022 normal, typical events not captured.  [facility] panel negative 03/2020  EMG/NCV of left arm in April 2022 showed very mild left median neuropathy and mild chronic C5-6 radiculopathy   PAST MEDICAL HISTORY: Past Medical History:  Diagnosis Date   Cataract cortical, senile, bilateral    Diabetes mellitus, type 2 (HCC)    Duodenal ulcer    Fatty liver    Ocular hypertension, bilateral    Peptic ulcer disease    Psoriasis    Seizures (HCC)     MEDICATIONS: Current Outpatient Medications on File Prior to  Visit  Medication Sig Dispense Refill   ACCU-CHEK GUIDE test strip USE AS INSTRUCTED 100 strip 12   Accu-Chek Softclix Lancets lancets Please check CBG in morning before eating, 2 hours after a meal and a bed time. TID 100 each 0   atorvastatin  (LIPITOR) 20 MG tablet TAKE 1 TABLET BY MOUTH EVERY DAY 90 tablet 0   Blood Glucose Monitoring Suppl (ACCU-CHEK GUIDE ME) w/Device KIT USE AS DIRECTED 1 kit 0   escitalopram  (LEXAPRO ) 20 MG tablet Take 1 tablet (20 mg total) by mouth daily. 90 tablet 3   hydrOXYzine  (VISTARIL ) 25 MG capsule TAKE 1 CAPSULE BY MOUTH EVERYDAY AT BEDTIME 90 capsule 3   insulin  degludec (TRESIBA  FLEXTOUCH) 100 UNIT/ML FlexTouch Pen INJECT 16 UNITS INTO THE SKIN DAILY. 6 mL 0   Insulin  Pen Needle (PEN NEEDLES) 31G X 8 MM MISC UAD for daily insulin  injection.  BD Ultra Fine needles 100 each 6   levETIRAcetam  (KEPPRA ) 500 MG tablet Take 1 tablet twice a day 180 tablet 3   lisinopril  (ZESTRIL ) 2.5 MG tablet Take 1 tablet (2.5 mg total) by mouth daily. Please schedule office visit with Dr. Lincoln Renshaw for refills 90 tablet 0   metFORMIN  (GLUCOPHAGE ) 500 MG tablet TAKE 1 TABLET BY MOUTH 2 TIMES DAILY WITH A MEAL. 180 tablet 3   pantoprazole  (PROTONIX ) 40 MG tablet Take 1 tablet (40 mg total) by mouth 2 (two) times daily. Please schedule office visit with Dr. Lincoln Renshaw for refills 180 tablet 0   Semaglutide , 1 MG/DOSE, 4 MG/3ML SOPN Inject 1 mg as directed once a week. 9 mL 1   triamcinolone  cream (KENALOG ) 0.1 % Apply to affected area BID PRN 80 g 1   No current facility-administered medications on file prior to visit.    ALLERGIES: Allergies  Allergen Reactions   Penicillins Hives and Other (See Comments)    Childhood allergy  Did it involve swelling of the face/tongue/throat, SOB, or low BP? Unknown Did it involve sudden or severe rash/hives, skin peeling, or any reaction on the inside of your mouth or nose? Unknown Did you need to seek medical attention at a hospital or doctor's  office? Unknown When did it last happen?      Pt was a child If all above answers are "NO", may proceed with cephalosporin use. Sister states he just breaks out in hives unsure about other reactions.     Prozac [Fluoxetine]     Agitated and belligerent towards family members    FAMILY HISTORY: Family History  Problem Relation Age of Onset   Diabetes Mother    Diabetes Sister    Diabetes Father    Esophageal cancer Neg Hx    Stomach cancer Neg Hx    Colon cancer Neg Hx    Pancreatic cancer Neg Hx     SOCIAL HISTORY: Social History   Socioeconomic History   Marital status: Single    Spouse name: Not on file  Number of children: Not on file   Years of education: Not on file   Highest education level: Not on file  Occupational History   Not on file  Tobacco Use   Smoking status: Never   Smokeless tobacco: Never  Vaping Use   Vaping status: Never Used  Substance and Sexual Activity   Alcohol use: Not Currently   Drug use: Not Currently    Comment: 5 times per month (per 04/21/19 hospital note)   Sexual activity: Not on file  Other Topics Concern   Not on file  Social History Narrative   Left handed   Drinks caffeine   Single story home   Social Drivers of Health   Financial Resource Strain: Low Risk  (07/27/2022)   Overall Financial Resource Strain (CARDIA)    Difficulty of Paying Living Expenses: Not very hard  Food Insecurity: No Food Insecurity (07/27/2022)   Hunger Vital Sign    Worried About Running Out of Food in the Last Year: Never true    Ran Out of Food in the Last Year: Never true  Transportation Needs: No Transportation Needs (07/27/2022)   PRAPARE - Administrator, Civil Service (Medical): No    Lack of Transportation (Non-Medical): No  Physical Activity: Inactive (07/27/2022)   Exercise Vital Sign    Days of Exercise per Week: 0 days    Minutes of Exercise per Session: 0 min  Stress: No Stress Concern Present (07/27/2022)   [employer] of Occupational [employer] - Occupational Stress Questionnaire    Feeling of Stress : Only a little  Social Connections: Socially Isolated (07/27/2022)   Social Connection and Isolation Panel [NHANES]    Frequency of Communication with Friends and Family: Never    Frequency of Social Gatherings with Friends and Family: More than three times a week    Attends Religious Services: Never    Database administrator or Organizations: No    Attends Banker Meetings: Never    Marital Status: Never married  Intimate Partner Violence: Not At Risk (07/27/2022)   Humiliation, Afraid, Rape, and Kick questionnaire    Fear of Current or Ex-Partner: No    Emotionally Abused: No    Physically Abused: No    Sexually Abused: No     PHYSICAL EXAM: Vitals:   08/30/23 0827  BP: (!) 85/61  Pulse: 67  SpO2: 98%   General: No acute distress, flat affect, poor eye contact Head:  Normocephalic/atraumatic Skin/Extremities: No rash, no edema Neurological Exam: alert and awake. No aphasia or dysarthria. Fund of knowledge is appropriate.  Attention and concentration are normal.   Cranial nerves: Pupils equal, round. Extraocular movements intact with no nystagmus. Visual fields full.  No facial asymmetry.  Motor: Bulk and tone normal, muscle strength 5/5 throughout with no pronator drift.   Finger to nose testing intact.  Gait narrow-based and steady, able to tandem walk adequately.  Romberg negative.   IMPRESSION: This is a 53 yo RH man with a history of hyperlipidemia, diabetes, remote seizures, in his usual state of health until the end of December 2020. He was found to be in renal failure, initial MRI brain showed changes in the left medial temporal lobe/hippocampus and left thalamus. During his hospital stay, he had a GTC with left gaze deviation. Repeat MRI brain a few days later showed extensive signal abnormality in the subcortical bilateral frontoparietal lobes, occipital lobes, and  posterior temporal lobes, with restricted diffusion suspicious for acute/early  subacute infarcts. 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 Follow-up MRI brain June 2021 and May 2022 no acute changes with scattered small areas of cortical encephalomalacia due to suspected PRES with a few areas progressing to infarction. 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 Autoimmune panel negative. Prolonged EEG normal.   He remains seizure-free since 2020, continue Levetiracetam  500mg  BID. He continues to have a flat affect, poor eye contact, lack of motivation, and was encouraged to establish care with Psychiatry. Continue Lexapro  20mg  daily. We discussed sleep hygiene, he is not interested in changing habits and declines home sleep study. Encouraged to increase physical activity, follow-up with PCP for BP/chronic condition management. He does not drive. Follow-up in 1 year, call for any changes.    Thank you for allowing me to participate in his care.  Please do not hesitate to call for any questions or concerns.    Rayfield Cairo, M.D.   CC: Dr. Lincoln Renshaw

## 2023-08-30 NOTE — Patient Instructions (Signed)
 Good to see you.  Continue all your medications  2. We will resend referral to Psychiatry, please schedule appointment when their office calls  3. Let me know when you want to proceed with sleep study  4. Recommend increasing physical activity during the day, schedule follow-up with PCP  5. Follow-up in 1 year, call for any changes   Seizure Precautions: 1. If medication has been prescribed for you to prevent seizures, take it exactly as directed.  Do not stop taking the medicine without talking to your doctor first, even if you have not had a seizure in a long time.   2. Avoid activities in which a seizure would cause danger to yourself or to others.  Don't operate dangerous machinery, swim alone, or climb in high or dangerous places, such as on ladders, roofs, or girders.  Do not drive unless your doctor says you may.  3. If you have any warning that you may have a seizure, lay down in a safe place where you can't hurt yourself.    4.  No driving for 6 months from last seizure, as per [place]  state law.   Please refer to the following link on the [employer]'s website for more information: http://www.epilepsyfoundation.org/answerplace/Social/driving/drivingu.cfm   5.  Maintain good sleep hygiene. Avoid alcohol.  6.  Contact your doctor if you have any problems that may be related to the medicine you are taking.  7.  Call 911 and bring the patient back to the ED if:        A.  The seizure lasts longer than 5 minutes.       B.  The patient doesn't awaken shortly after the seizure  C.  The patient has new problems such as difficulty seeing, speaking or moving  D.  The patient was injured during the seizure  E.  The patient has a temperature over 102 F (39C)  F.  The patient vomited and now is having trouble breathing

## 2024-02-03 DIAGNOSIS — H25812 Combined forms of age-related cataract, left eye: Secondary | ICD-10-CM | POA: Diagnosis not present

## 2024-02-03 DIAGNOSIS — E119 Type 2 diabetes mellitus without complications: Secondary | ICD-10-CM | POA: Diagnosis not present

## 2024-02-03 DIAGNOSIS — Z961 Presence of intraocular lens: Secondary | ICD-10-CM | POA: Diagnosis not present

## 2024-02-07 DIAGNOSIS — H268 Other specified cataract: Secondary | ICD-10-CM | POA: Diagnosis not present

## 2024-03-02 ENCOUNTER — Encounter: Payer: Self-pay | Admitting: Neurology

## 2024-04-27 NOTE — Progress Notes (Signed)
 Giulio Bertino                                          MRN: [id number]   04/27/2024   The [facility] Quality Team Specialist reviewed this patient medical record for the purposes of chart review for care gap closure. The following were reviewed: chart review for care gap closure-glycemic status assessment.    [facility] Quality Team

## 2024-08-28 ENCOUNTER — Ambulatory Visit: Admitting: Neurology
